# Patient Record
Sex: Female | Born: 1935 | Race: White | Hispanic: No | Marital: Married | Smoking: Never smoker
Health system: Southern US, Community
[De-identification: ages and names within clinical notes are randomized; demographics above are authoritative.]

## PROBLEM LIST (undated history)

## (undated) DIAGNOSIS — D649 Anemia, unspecified: Secondary | ICD-10-CM

## (undated) DIAGNOSIS — I4819 Other persistent atrial fibrillation: Secondary | ICD-10-CM

## (undated) DIAGNOSIS — I6529 Occlusion and stenosis of unspecified carotid artery: Secondary | ICD-10-CM

## (undated) DIAGNOSIS — J45909 Unspecified asthma, uncomplicated: Secondary | ICD-10-CM

## (undated) DIAGNOSIS — I739 Peripheral vascular disease, unspecified: Secondary | ICD-10-CM

## (undated) DIAGNOSIS — M1990 Unspecified osteoarthritis, unspecified site: Secondary | ICD-10-CM

## (undated) DIAGNOSIS — I10 Essential (primary) hypertension: Secondary | ICD-10-CM

## (undated) DIAGNOSIS — H8110 Benign paroxysmal vertigo, unspecified ear: Secondary | ICD-10-CM

## (undated) DIAGNOSIS — Z87442 Personal history of urinary calculi: Secondary | ICD-10-CM

## (undated) DIAGNOSIS — R42 Dizziness and giddiness: Secondary | ICD-10-CM

## (undated) DIAGNOSIS — R0600 Dyspnea, unspecified: Secondary | ICD-10-CM

## (undated) DIAGNOSIS — R011 Cardiac murmur, unspecified: Secondary | ICD-10-CM

## (undated) DIAGNOSIS — I2510 Atherosclerotic heart disease of native coronary artery without angina pectoris: Secondary | ICD-10-CM

## (undated) DIAGNOSIS — J189 Pneumonia, unspecified organism: Secondary | ICD-10-CM

## (undated) DIAGNOSIS — G4730 Sleep apnea, unspecified: Secondary | ICD-10-CM

## (undated) DIAGNOSIS — N200 Calculus of kidney: Secondary | ICD-10-CM

## (undated) DIAGNOSIS — I499 Cardiac arrhythmia, unspecified: Secondary | ICD-10-CM

## (undated) DIAGNOSIS — I779 Disorder of arteries and arterioles, unspecified: Secondary | ICD-10-CM

## (undated) DIAGNOSIS — E785 Hyperlipidemia, unspecified: Secondary | ICD-10-CM

## (undated) DIAGNOSIS — C801 Malignant (primary) neoplasm, unspecified: Secondary | ICD-10-CM

## (undated) HISTORY — DX: Disorder of arteries and arterioles, unspecified: I77.9

## (undated) HISTORY — PX: OTHER SURGICAL HISTORY: SHX169

## (undated) HISTORY — DX: Sleep apnea, unspecified: G47.30

## (undated) HISTORY — PX: CARDIAC SURGERY: SHX584

## (undated) HISTORY — PX: TONSILLECTOMY: SUR1361

## (undated) HISTORY — DX: Peripheral vascular disease, unspecified: I73.9

## (undated) HISTORY — DX: Atherosclerotic heart disease of native coronary artery without angina pectoris: I25.10

## (undated) HISTORY — DX: Calculus of kidney: N20.0

## (undated) HISTORY — PX: VASCULAR SURGERY: SHX849

## (undated) HISTORY — DX: Occlusion and stenosis of unspecified carotid artery: I65.29

## (undated) HISTORY — PX: CARPAL TUNNEL RELEASE: SHX101

## (undated) HISTORY — PX: CAROTID ENDARTERECTOMY: SUR193

## (undated) HISTORY — PX: CARDIAC CATHETERIZATION: SHX172

## (undated) HISTORY — DX: Dizziness and giddiness: R42

## (undated) HISTORY — DX: Hyperlipidemia, unspecified: E78.5

## (undated) HISTORY — PX: EYE SURGERY: SHX253

## (undated) HISTORY — PX: CORONARY ARTERY BYPASS GRAFT: SHX141

---

## 1997-09-13 ENCOUNTER — Encounter: Admission: RE | Admit: 1997-09-13 | Discharge: 1997-12-12 | Payer: Self-pay | Admitting: *Deleted

## 1999-07-06 ENCOUNTER — Inpatient Hospital Stay (HOSPITAL_COMMUNITY): Admission: EM | Admit: 1999-07-06 | Discharge: 1999-07-14 | Payer: Self-pay | Admitting: Emergency Medicine

## 1999-07-06 ENCOUNTER — Encounter: Payer: Self-pay | Admitting: Cardiothoracic Surgery

## 1999-07-07 ENCOUNTER — Encounter: Payer: Self-pay | Admitting: Cardiothoracic Surgery

## 1999-07-08 ENCOUNTER — Encounter: Payer: Self-pay | Admitting: Cardiothoracic Surgery

## 1999-07-09 ENCOUNTER — Encounter: Payer: Self-pay | Admitting: Cardiothoracic Surgery

## 1999-07-10 ENCOUNTER — Encounter: Payer: Self-pay | Admitting: Cardiothoracic Surgery

## 1999-07-11 ENCOUNTER — Encounter: Payer: Self-pay | Admitting: Cardiothoracic Surgery

## 1999-08-07 ENCOUNTER — Encounter (HOSPITAL_COMMUNITY): Admission: RE | Admit: 1999-08-07 | Discharge: 1999-11-05 | Payer: Self-pay | Admitting: *Deleted

## 2001-02-24 ENCOUNTER — Inpatient Hospital Stay (HOSPITAL_COMMUNITY): Admission: EM | Admit: 2001-02-24 | Discharge: 2001-02-25 | Payer: Self-pay

## 2001-02-25 ENCOUNTER — Encounter: Payer: Self-pay | Admitting: Cardiology

## 2001-02-25 ENCOUNTER — Encounter: Payer: Self-pay | Admitting: *Deleted

## 2001-04-09 ENCOUNTER — Other Ambulatory Visit: Admission: RE | Admit: 2001-04-09 | Discharge: 2001-04-09 | Payer: Self-pay | Admitting: Family Medicine

## 2002-09-08 ENCOUNTER — Encounter: Admission: RE | Admit: 2002-09-08 | Discharge: 2002-11-19 | Payer: Self-pay | Admitting: Orthopaedic Surgery

## 2004-04-16 ENCOUNTER — Encounter: Admission: RE | Admit: 2004-04-16 | Discharge: 2004-04-16 | Payer: Self-pay | Admitting: *Deleted

## 2005-04-22 ENCOUNTER — Encounter: Admission: RE | Admit: 2005-04-22 | Discharge: 2005-04-22 | Payer: Self-pay | Admitting: *Deleted

## 2005-04-30 ENCOUNTER — Ambulatory Visit: Payer: Self-pay | Admitting: Cardiology

## 2005-05-21 ENCOUNTER — Ambulatory Visit: Payer: Self-pay | Admitting: Cardiology

## 2005-05-22 ENCOUNTER — Encounter (INDEPENDENT_AMBULATORY_CARE_PROVIDER_SITE_OTHER): Payer: Self-pay | Admitting: Specialist

## 2005-05-22 ENCOUNTER — Inpatient Hospital Stay (HOSPITAL_COMMUNITY): Admission: RE | Admit: 2005-05-22 | Discharge: 2005-05-23 | Payer: Self-pay | Admitting: *Deleted

## 2006-03-04 ENCOUNTER — Emergency Department (HOSPITAL_COMMUNITY): Admission: EM | Admit: 2006-03-04 | Discharge: 2006-03-05 | Payer: Self-pay | Admitting: Emergency Medicine

## 2006-10-22 ENCOUNTER — Ambulatory Visit: Payer: Self-pay | Admitting: Cardiology

## 2007-02-19 ENCOUNTER — Ambulatory Visit: Payer: Self-pay | Admitting: *Deleted

## 2007-05-27 ENCOUNTER — Ambulatory Visit: Payer: Self-pay | Admitting: Cardiology

## 2007-07-06 ENCOUNTER — Ambulatory Visit: Payer: Self-pay

## 2007-11-09 ENCOUNTER — Encounter: Admission: RE | Admit: 2007-11-09 | Discharge: 2007-11-09 | Payer: Self-pay | Admitting: Orthopedic Surgery

## 2007-11-10 ENCOUNTER — Encounter (INDEPENDENT_AMBULATORY_CARE_PROVIDER_SITE_OTHER): Payer: Self-pay | Admitting: Orthopedic Surgery

## 2007-11-10 ENCOUNTER — Ambulatory Visit (HOSPITAL_BASED_OUTPATIENT_CLINIC_OR_DEPARTMENT_OTHER): Admission: RE | Admit: 2007-11-10 | Discharge: 2007-11-10 | Payer: Self-pay | Admitting: Orthopedic Surgery

## 2008-06-22 ENCOUNTER — Ambulatory Visit: Payer: Self-pay | Admitting: Cardiology

## 2008-10-03 ENCOUNTER — Encounter: Payer: Self-pay | Admitting: Cardiology

## 2008-12-09 ENCOUNTER — Ambulatory Visit: Payer: Self-pay | Admitting: Pulmonary Disease

## 2008-12-09 DIAGNOSIS — I10 Essential (primary) hypertension: Secondary | ICD-10-CM | POA: Insufficient documentation

## 2008-12-09 DIAGNOSIS — I2510 Atherosclerotic heart disease of native coronary artery without angina pectoris: Secondary | ICD-10-CM | POA: Insufficient documentation

## 2008-12-09 DIAGNOSIS — R059 Cough, unspecified: Secondary | ICD-10-CM | POA: Insufficient documentation

## 2008-12-09 DIAGNOSIS — E785 Hyperlipidemia, unspecified: Secondary | ICD-10-CM | POA: Insufficient documentation

## 2008-12-09 DIAGNOSIS — R05 Cough: Secondary | ICD-10-CM | POA: Insufficient documentation

## 2009-01-03 ENCOUNTER — Ambulatory Visit: Payer: Self-pay | Admitting: Pulmonary Disease

## 2009-01-03 ENCOUNTER — Ambulatory Visit (HOSPITAL_BASED_OUTPATIENT_CLINIC_OR_DEPARTMENT_OTHER): Admission: RE | Admit: 2009-01-03 | Discharge: 2009-01-03 | Payer: Self-pay | Admitting: Pulmonary Disease

## 2009-01-03 DIAGNOSIS — G4733 Obstructive sleep apnea (adult) (pediatric): Secondary | ICD-10-CM | POA: Insufficient documentation

## 2009-01-17 ENCOUNTER — Encounter: Payer: Self-pay | Admitting: Pulmonary Disease

## 2009-01-31 ENCOUNTER — Ambulatory Visit: Payer: Self-pay | Admitting: Pulmonary Disease

## 2009-01-31 DIAGNOSIS — G2581 Restless legs syndrome: Secondary | ICD-10-CM | POA: Insufficient documentation

## 2009-02-03 LAB — CONVERTED CEMR LAB
ALT: 24 units/L (ref 0–35)
AST: 20 units/L (ref 0–37)
Albumin: 4 g/dL (ref 3.5–5.2)
Alkaline Phosphatase: 74 units/L (ref 39–117)
BUN: 35 mg/dL — ABNORMAL HIGH (ref 6–23)
Basophils Absolute: 0 10*3/uL (ref 0.0–0.1)
Basophils Relative: 0.2 % (ref 0.0–3.0)
Bilirubin, Direct: 0.1 mg/dL (ref 0.0–0.3)
CO2: 30 meq/L (ref 19–32)
Calcium: 9.5 mg/dL (ref 8.4–10.5)
Chloride: 108 meq/L (ref 96–112)
Creatinine, Ser: 1.4 mg/dL — ABNORMAL HIGH (ref 0.4–1.2)
Eosinophils Absolute: 0.4 10*3/uL (ref 0.0–0.7)
Eosinophils Relative: 4.1 % (ref 0.0–5.0)
Ferritin: 115.4 ng/mL (ref 10.0–291.0)
Folate: >20 ng/mL
GFR calc non Af Amer: 38.96 mL/min (ref >60)
Glucose, Bld: 112 mg/dL — ABNORMAL HIGH (ref 70–99)
HCT: 39.3 % (ref 36.0–46.0)
Hemoglobin: 13.3 g/dL (ref 12.0–15.0)
Iron: 73 ug/dL (ref 42–145)
Lymphocytes Relative: 16.3 % (ref 12.0–46.0)
Lymphs Abs: 1.4 10*3/uL (ref 0.7–4.0)
MCHC: 33.8 g/dL (ref 30.0–36.0)
MCV: 91.3 fL (ref 78.0–100.0)
Monocytes Absolute: 0.8 10*3/uL (ref 0.1–1.0)
Monocytes Relative: 9.2 % (ref 3.0–12.0)
Neutro Abs: 6.2 10*3/uL (ref 1.4–7.7)
Neutrophils Relative %: 70.2 % (ref 43.0–77.0)
Platelets: 278 10*3/uL (ref 150.0–400.0)
Potassium: 4.5 meq/L (ref 3.5–5.1)
RBC: 4.3 M/uL (ref 3.87–5.11)
RDW: 13 % (ref 11.5–14.6)
Saturation Ratios: 19.4 % — ABNORMAL LOW (ref 20.0–50.0)
Sodium: 143 meq/L (ref 135–145)
TSH: 3.03 microintl units/mL (ref 0.35–5.50)
Total Bilirubin: 1 mg/dL (ref 0.3–1.2)
Total Protein: 7.3 g/dL (ref 6.0–8.3)
Transferrin: 269 mg/dL (ref 212.0–360.0)
Vitamin B-12: 667 pg/mL (ref 211–911)
WBC: 8.8 10*3/uL (ref 4.5–10.5)

## 2009-04-05 ENCOUNTER — Encounter (INDEPENDENT_AMBULATORY_CARE_PROVIDER_SITE_OTHER): Payer: Self-pay | Admitting: *Deleted

## 2009-05-22 ENCOUNTER — Encounter: Payer: Self-pay | Admitting: Pulmonary Disease

## 2009-06-28 ENCOUNTER — Encounter: Payer: Self-pay | Admitting: Pulmonary Disease

## 2009-08-08 ENCOUNTER — Encounter: Payer: Self-pay | Admitting: Pulmonary Disease

## 2009-08-15 ENCOUNTER — Encounter: Payer: Self-pay | Admitting: Pulmonary Disease

## 2009-08-31 ENCOUNTER — Ambulatory Visit: Payer: Self-pay | Admitting: Vascular Surgery

## 2009-09-13 ENCOUNTER — Ambulatory Visit: Payer: Self-pay | Admitting: Cardiology

## 2009-09-18 ENCOUNTER — Encounter: Payer: Self-pay | Admitting: Pulmonary Disease

## 2009-10-05 ENCOUNTER — Encounter: Payer: Self-pay | Admitting: Cardiology

## 2009-10-18 ENCOUNTER — Encounter (INDEPENDENT_AMBULATORY_CARE_PROVIDER_SITE_OTHER): Payer: Self-pay | Admitting: *Deleted

## 2010-04-02 ENCOUNTER — Ambulatory Visit: Payer: Self-pay | Admitting: Pulmonary Disease

## 2010-04-02 DIAGNOSIS — J45909 Unspecified asthma, uncomplicated: Secondary | ICD-10-CM | POA: Insufficient documentation

## 2010-05-01 ENCOUNTER — Encounter: Payer: Self-pay | Admitting: Pulmonary Disease

## 2010-06-05 ENCOUNTER — Ambulatory Visit
Admission: RE | Admit: 2010-06-05 | Discharge: 2010-06-05 | Payer: Self-pay | Source: Home / Self Care | Attending: Pulmonary Disease | Admitting: Pulmonary Disease

## 2010-06-09 ENCOUNTER — Encounter: Payer: Self-pay | Admitting: *Deleted

## 2010-06-13 ENCOUNTER — Encounter: Payer: Self-pay | Admitting: Pulmonary Disease

## 2010-06-19 NOTE — Letter (Signed)
Summary: Engineer, materials at [facility]  [address]   [place], [place] [address]   Phone: [phone]  Fax: [phone]        October 18, 2009 MRN: [id number]    [facility] [address] [place], [place]  [address]    Dear Ms. Lori Price,  Your test ordered by Selena Batten has been reviewed by your physician (or physician assistant) and was found to be normal or stable. Your physician (or physician assistant) felt no changes were needed at this time.  ____ Echocardiogram  ____ Cardiac Stress Test  __X__ Lab Work-Continue current therapy  ____ Peripheral vascular study of arms, legs or neck  ____ CT scan or X-ray  ____ Lung or Breathing test  ____ Other:   Thank you.   Cyril Loosen, RN, BSN    Duane Boston, M.D., F.A.C.C. Thressa Sheller, M.D., F.A.C.C. Oneal Grout, M.D., F.A.C.C. Cheree Ditto, M.D., F.A.C.C. Daiva Nakayama, M.D., F.A.C.C. Kenney Houseman, M.D., F.A.C.C. Jeanne Ivan, PA-C

## 2010-06-19 NOTE — Assessment & Plan Note (Signed)
Summary: ROV ///KP   Visit Type:  Follow-up Primary Provider/Referring Provider:  Doreen Beam  CC:  OSA...the patient has not worn her cpap since July 2011 and says the mask causes her eyes to swell shut...RLS symptoms come and go...no changes in her breathing.  History of Present Illness: 77 yo female with cough with asthma, RLS and OSA on CPAP 13.  She has been getting swelling in her eyes from her CPAP mask due to mask leak.  As a result she stopped using her CPAP in July.  She tried contacting her DME for a new mask, but never heard back.  She was told that her machine was payed for, and she was now responsible for replacement equipment.  Since she stopped CPAP she has notice her sleep and energy level have been worse.  She has also been getting more coughing at night.  This happens more as she is falling asleep.  She gets coughing spells when she is sitting quiet also (such as in church).  Her husband has commented that she can fall asleep when sitting quiet.  She is not having much wheeze or sputum.  She denies chest pain, fever, or hemoptysis.  Her sinuses are okay on her sinus regimen.  She has been getting a scratchy throat.  She was started on accupril over the summer, and this is around the time her cough seem to get worse.  Her legs have not been bothering her too much recently.  Current Medications (verified): 1)  Singulair 10 Mg Tabs (Montelukast Sodium) .... One By Mouth At Bedtime 2)  Qvar 80 Mcg/act Aers (Beclomethasone Dipropionate) .... Two Puffs Two Times A Day 3)  Proventil Hfa 108 (90 Base) Mcg/act Aers (Albuterol Sulfate) .Marland Kitchen.. 1-2 Puffs Four Times Daily As Needed 4)  Allegra 180 Mg Tabs (Fexofenadine Hcl) .Marland Kitchen.. 1 By Mouth Daily As Needed 5)  Atenolol 50 Mg Tabs (Atenolol) .... Take 1 Tablet Once A Day 6)  Simvastatin 40 Mg Tabs (Simvastatin) .... Take 1 Tablet By Mouth At Bedtime 7)  Triamterene-Hctz 37.5-25 Mg Caps (Triamterene-Hctz) .... Take 1 Capsule By Mouth Once  A Day 8)  Meclizine Hcl 25 Mg Tabs (Meclizine Hcl) .... 1/2-1 Daily As Needed 9)  Diclofenac Sodium 75 Mg Tbec (Diclofenac Sodium) .Marland Kitchen.. 1 By Mouth Two Times A Day 10)  Magnesium 250 Mg Tabs (Magnesium) .Marland Kitchen.. 1 By Mouth Two Times A Day 11)  Dulcolax 5 Mg Tbec (Bisacodyl) .Marland Kitchen.. 1 By Mouth Every Other Day 12)  Aspir-Low 81 Mg Tbec (Aspirin) .Marland Kitchen.. 1 By Mouth Daily 13)  Folic Acid 400 Mcg Tabs (Folic Acid) .Marland Kitchen.. 1 By Mouth Daily 14)  Accupril 20 Mg Tabs (Quinapril Hcl) .Marland Kitchen.. 1 By Mouth Daily 15)  Aspartate Mg & K 90-90 Mg Caps (Mag Aspart-Potassium Aspart) .Marland Kitchen.. 1 By Mouth 3 Times A Week 16)  Fish Oil 1000 Mg Caps (Omega-3 Fatty Acids) .Marland Kitchen.. 1 By Mouth Daily 17)  Protonix 40 Mg Tbec (Pantoprazole Sodium) .Marland Kitchen.. 1 By Mouth Two Times A Day  Allergies (verified): 1)  ! Codeine 2)  ! * Meridia 3)  ! Sulfa 4)  ! * Eggs-Flu Shot 5)  ! Ace Inhibitors 6)  ! Cozaar  Past History:  Past Medical History: Current Problems:  CAD s/ p MI 2001       - (catheterizations in 2001 with left main 70% stenosis, LAD 70% followed by 90% stenosis, circumflex99%         ostial stenosis, right coronary artery occluded).  Peripheral  vascular disease Hyperlipidemia Vertigo Nephrolithiasis Chronic cough/asthma      - Spirometry 12/09/08 FEV1 1.91(84%), FVC 2.39 (74%), FEV1% 80 OSA      - PSG 01/03/09 AHI 16, PLMI 87      - CPAP 13 cm H2O  Past Surgical History: Reviewed history from 09/07/2009 and no changes required. Knee Arthroscopy--left C A B G 2000 (LIMA to the LAD, SVG to obtuse marginal, sequential SVG to       RV, marginal branch in the distal right coronary artery).  Tonsillectomy Right carotid  endarterectomy 2007 Left leg fracture Left carpal tunnel repair 2009  Vital Signs:  Patient profile:   77 year old female Height:      68 inches (172.72 cm) Weight:      228.50 pounds (103.86 kg) BMI:     34.87 O2 Sat:      95 % on Room air Temp:     97.4 degrees F (36.33 degrees C) oral Pulse rate:   56  / minute BP sitting:   130 / 78  (left arm) Cuff size:   large  Vitals Entered By: Michel Bickers CMA (April 02, 2010 4:11 PM)  O2 Sat at Rest %:  95 O2 Flow:  Room air CC: OSA...the patient has not worn her cpap since July 2011 and says the mask causes her eyes to swell shut...RLS symptoms come and go...no changes in her breathing Comments Medications reviewed with patient Michel Bickers CMA  April 02, 2010 4:12 PM   Physical Exam  General:  normal appearance, healthy appearing, and obese.   Nose:  narrow nasal angles, clear discharge, no tenderness Mouth:  MP 3, enlarged tongue, low laying soft palate Neck:  no JVD, no thyromegaly, scar over right neck Lungs:  clear bilaterally to auscultation and percussion Heart:  regular rate and rhythm, S1, S2 without murmurs, rubs, gallops, or clicks Extremities:  no clubbing, cyanosis, edema, or deformity noted Neurologic:  normal CN II-XII and strength normal.   Cervical Nodes:  no significant adenopathy Psych:  alert and cooperative; normal mood and affect; normal attention span and concentration   Impression & Recommendations:  Problem # 1:  COUGH (ICD-786.2) Multifactorial.    She has asthma, and some of her symptoms are related to this.  Continue treatment as detailed below.  She was recently started on ACE inhibitor, and this may be contributing to her cough.  Will have her stop accupril, and start diovan 80 mg once daily.  Of note is that she is also on atenolol.  She may benefit from a more cardioselective beta blocker due to her asthma if her cough symptoms persist.  Some of her nocturnal cough may be related to untreated sleep apnea.  She may also be having episodes of micro-sleep associated with apneic events during the day.  Will restart CPAP therapy.  Problem # 2:  ASTHMA (ICD-493.90) Continue qvar, singulair.  She can continue as needed allegra and proventil.  Problem # 3:  OBSTRUCTIVE SLEEP APNEA (ICD-327.23) She did  well with CPAP except for mask problems.  Will arrange for mask refit at sleep lab.  Will re-establish her with a DME.  Problem # 4:  RESTLESS LEG SYNDROME (ICD-333.94) Continue clinical monitoring.  Medications Added to Medication List This Visit: 1)  Diovan 80 Mg Tabs (Valsartan) .... One by mouth once daily  Complete Medication List: 1)  Singulair 10 Mg Tabs (Montelukast sodium) .... One by mouth at bedtime 2)  Qvar 80 Mcg/act Aers (  Beclomethasone dipropionate) .... Two puffs two times a day 3)  Proventil Hfa 108 (90 Base) Mcg/act Aers (Albuterol sulfate) .Marland Kitchen.. 1-2 puffs four times daily as needed 4)  Allegra 180 Mg Tabs (Fexofenadine hcl) .Marland Kitchen.. 1 by mouth daily as needed 5)  Atenolol 50 Mg Tabs (Atenolol) .... Take 1 tablet once a day 6)  Simvastatin 40 Mg Tabs (Simvastatin) .... Take 1 tablet by mouth at bedtime 7)  Triamterene-hctz 37.5-25 Mg Caps (Triamterene-hctz) .... Take 1 capsule by mouth once a day 8)  Meclizine Hcl 25 Mg Tabs (Meclizine hcl) .... 1/2-1 daily as needed 9)  Diclofenac Sodium 75 Mg Tbec (Diclofenac sodium) .Marland Kitchen.. 1 by mouth two times a day 10)  Magnesium 250 Mg Tabs (Magnesium) .Marland Kitchen.. 1 by mouth two times a day 11)  Dulcolax 5 Mg Tbec (Bisacodyl) .Marland Kitchen.. 1 by mouth every other day 12)  Aspir-low 81 Mg Tbec (Aspirin) .Marland Kitchen.. 1 by mouth daily 13)  Folic Acid 400 Mcg Tabs (Folic acid) .Marland Kitchen.. 1 by mouth daily 14)  Aspartate Mg & K 90-90 Mg Caps (Mag aspart-potassium aspart) .Marland Kitchen.. 1 by mouth 3 times a week 15)  Fish Oil 1000 Mg Caps (Omega-3 fatty acids) .Marland Kitchen.. 1 by mouth daily 16)  Protonix 40 Mg Tbec (Pantoprazole sodium) .Marland Kitchen.. 1 by mouth two times a day 17)  Diovan 80 Mg Tabs (Valsartan) .... One by mouth once daily  Other Orders: Est. Patient Level IV ([address]) DME Referral (DME) Sleep Disorder Referral (Sleep Disorder)  Patient Instructions: 1)  Stop accupril 2)  Diovan 80 mg once daily 3)  Will arrange for CPAP mask fit at sleep lab 4)  Follow up in 2  months Prescriptions: QVAR 80 MCG/ACT AERS (BECLOMETHASONE DIPROPIONATE) two puffs two times a day  #3 x 3   Entered and Authorized by:   Coralyn Helling MD   Signed by:   Coralyn Helling MD on 04/02/2010   Method used:   Printed then faxed to ...       [facility]ail[facility]       [address]       [place], [place]  [address]       Ph: [phone] or [phone]       Fax: [phone]   RxID:   [id number] SINGULAIR 10 MG TABS (MONTELUKAST SODIUM) one by mouth at bedtime  #90 x 3   Entered and Authorized by:   Coralyn Helling MD   Signed by:   Coralyn Helling MD on 04/02/2010   Method used:   Printed then faxed to ...       Hospital doctor (retail)       [address]       [place], [place]  [address]       Ph: [phone] or [phone]       Fax: [phone]   RxID:   [id number] DIOVAN 80 MG TABS (VALSARTAN) one by mouth once daily  #30 x 6   Entered and Authorized by:   Coralyn Helling MD   Signed by:   Coralyn Helling MD on 04/02/2010   Method used:   Print then Give to Patient   RxID:   [id number]

## 2010-06-19 NOTE — Assessment & Plan Note (Signed)
Summary: [facility]   Visit Type:  Follow-up Primary Provider:  Doreen Beam  CC:  CAD.  History of Present Illness: The patient presents for yearly followup. Since I last saw her she has had no new cardiovascular complaints. She does not exercise but goes up and down stairs. She may get slightly dyspneic with this. However, she has no resting shortness of breath, PND or orthopnea. She does not report chest discomfort, neck or arm discomfort. She does not have palpitations, presyncope or syncope. She unfortunately has not been able to lose weight.  Of note she had a significant cough when I last saw her. Despite stopping her ARB and the cough did not improve. She was subsequently found to have sleep apnea and treatment with CPAP improved her cough.  Current Medications (verified): 1)  Singulair 10 Mg Tabs (Montelukast Sodium) .... One By Mouth At Bedtime 2)  Qvar 80 Mcg/act Aers (Beclomethasone Dipropionate) .... Two Puffs Two Times A Day 3)  Proventil Hfa 108 (90 Base) Mcg/act Aers (Albuterol Sulfate) .Marland Kitchen.. 1-2 Puffs Four Times Daily As Needed 4)  Allegra 180 Mg Tabs (Fexofenadine Hcl) .Marland Kitchen.. 1 By Mouth Daily As Needed 5)  Atenolol 50 Mg Tabs (Atenolol) .... Take 1 Tablet Once A Day 6)  Simvastatin 40 Mg Tabs (Simvastatin) .... Take 1 Tablet By Mouth At Bedtime 7)  Triamterene-Hctz 37.5-25 Mg Caps (Triamterene-Hctz) .... Take 1 Capsule By Mouth Once A Day 8)  Meclizine Hcl 25 Mg Tabs (Meclizine Hcl) .... 1/2-1 Daily As Needed 9)  Diclofenac Sodium 75 Mg Tbec (Diclofenac Sodium) .Marland Kitchen.. 1 By Mouth Two Times A Day 10)  Magnesium 250 Mg Tabs (Magnesium) .Marland Kitchen.. 1 By Mouth Two Times A Day 11)  Dulcolax 5 Mg Tbec (Bisacodyl) .Marland Kitchen.. 1 By Mouth Every Other Day 12)  Aspir-Low 81 Mg Tbec (Aspirin) .Marland Kitchen.. 1 By Mouth Daily 13)  Folic Acid 400 Mcg Tabs (Folic Acid) .Marland Kitchen.. 1 By Mouth Daily 14)  Accupril 20 Mg Tabs (Quinapril Hcl) .Marland Kitchen.. 1 By Mouth Daily 15)  Aspartate Mg & K 90-90 Mg Caps (Mag Aspart-Potassium  Aspart) .Marland Kitchen.. 1 By Mouth 3 Times A Week 16)  Fish Oil 1000 Mg Caps (Omega-3 Fatty Acids) .Marland Kitchen.. 1 By Mouth Daily 17)  Protonix 40 Mg Tbec (Pantoprazole Sodium) .Marland Kitchen.. 1 By Mouth Two Times A Day  Allergies (verified): 1)  ! Codeine 2)  ! * Meridia 3)  ! Sulfa 4)  ! * Eggs-Flu Shot 5)  ! Ace Inhibitors 6)  ! Cozaar  Past History:  Past Medical History: Reviewed history from 09/07/2009 and no changes required. Current Problems:  CAD s/ p MI 2001 (catheterizations in 2001 with left main       70% stenosis, LAD 70% followed by 90% stenosis, circumflex 99%       ostial stenosis, right coronary artery occluded).     Peripheral vascular disease Hyperlipidemia Vertigo Nephrolithiasis Chronic cough/asthma      - Spirometry 12/09/08 FEV1 1.91(84%), FVC 2.39 (74%), FEV1% 80 OSA      - PSG 01/03/09 AHI 16, PLMI 87      - CPAP 13 cm H2O  Past Surgical History: Reviewed history from 09/07/2009 and no changes required. Knee Arthroscopy--left C A B G 2000 (LIMA to the LAD, SVG to obtuse marginal, sequential SVG to       RV, marginal branch in the distal right coronary artery).  Tonsillectomy Right carotid  endarterectomy 2007 Left leg fracture Left carpal tunnel repair 2009  Review of Systems  As stated in the HPI and negative for all other systems.   Vital Signs:  Patient profile:   77 year old female Height:      68 inches Weight:      230 pounds BMI:     35.10 Pulse rate:   54 / minute Resp:     18 per minute BP sitting:   120 / 78  (right arm)  Vitals Entered By: Marrion Coy, CNA (September 13, 2009 4:16 PM)  Physical Exam  General:  Well developed, well nourished, in no acute distress. Head:  normocephalic and atraumatic Neck:  Neck supple, no JVD. No masses, thyromegaly or abnormal cervical nodes. Chest Wall:  no deformities or breast masses noted Lungs:  Clear bilaterally to auscultation and percussion. Abdomen:  Bowel sounds positive; abdomen soft and non-tender  without masses, organomegaly, or hernias noted. No hepatosplenomegaly, obese Msk:  Back normal, normal gait. Muscle strength and tone normal. Extremities:  No clubbing or cyanosis. Neurologic:  Alert and oriented x 3. Skin:  Intact without lesions or rashes. Psych:  Normal affect.   Detailed Cardiovascular Exam  Neck    Carotids: Carotids full and equal bilaterally without bruits.      Neck Veins: Normal, no JVD.    Heart    Inspection: no deformities or lifts noted.      Palpation: normal PMI with no thrills palpable.      Auscultation: S1 and S2 within normal limits, no S3, no S4, no clicks, no rubs, 2/6 apical systolic murmur radiating slightly out the aortic outflow tract, early peaking, no diastolic murmurs  Vascular    Abdominal Aorta: no palpable masses, pulsations, or audible bruits.      Femoral Pulses: normal femoral pulses bilaterally.      Pedal Pulses: normal pedal pulses bilaterally.      Radial Pulses: normal radial pulses bilaterally.      Peripheral Circulation: no clubbing, cyanosis, or edema noted with normal capillary refill.     EKG  Procedure date:  09/13/2009  Findings:      sinus rhythm, rate 54, axis within normal limits, intervals within normal limits, nonspecific high lateral T-wave inversions new since previous  Impression & Recommendations:  Problem # 1:  CORONARY HEART DISEASE (ICD-414.00) She is having no new symptoms since her last stress perfusion study. She will continue with risk reduction. No further testing is indicated. Orders: EKG w/ Interpretation (93000)  Problem # 2:  HYPERTENSION (ICD-401.9) She reports that her blood pressure does go up occasionally and she can feel this. She takes 20 mg of Accupril and this improves it for the rest of that day. She does this rarely. Since this works I do not have a problem with her doing this. However, she will let me know if she is having more problems with her blood pressure over  time.  Problem # 3:  HYPERLIPIDEMIA (ICD-272.4) She has not had a recent lipid profile and I have instructed her to obtain one and given her written instructions. I would be happy to review this with a goal LDL less than 100 and HDL greater than 40. Prescriptions: ACCUPRIL 20 MG TABS (QUINAPRIL HCL) 1 by mouth daily  #30 x 11   Entered by:   Charolotte Capuchin, RN   Authorized by:   Rollene Rotunda, MD, [facility]   Signed by:   Charolotte Capuchin, RN on 09/13/2009   Method used:   Print then Give to Patient   RxID:   [id number]

## 2010-06-19 NOTE — Letter (Signed)
Summary: CMN for CPAP supplies/[facility]  CMN for CPAP supplies/[facility]   Imported By: Sherian Rein 05/26/2009 11:28:44  _____________________________________________________________________  External Attachment:    Type:   Image     Comment:   External Document

## 2010-06-19 NOTE — Letter (Signed)
Summary: CMN for CPAP Supplies/[facility]  CMN for CPAP Supplies/[facility]   Imported By: Sherian Rein 09/25/2009 15:05:19  _____________________________________________________________________  External Attachment:    Type:   Image     Comment:   External Document

## 2010-06-19 NOTE — Letter (Signed)
Summary: CMN for CPAP Supplies/[facility]  CMN for CPAP Supplies/[facility]   Imported By: Sherian Rein 08/21/2009 11:30:38  _____________________________________________________________________  External Attachment:    Type:   Image     Comment:   External Document

## 2010-06-19 NOTE — Miscellaneous (Signed)
  Clinical Lists Changes  Medications: Rx of ACCUPRIL 20 MG TABS (QUINAPRIL HCL) 1 by mouth daily;  #30 x 11;  Signed;  Entered by: Charolotte Capuchin, RN;  Authorized by: Rollene Rotunda, MD, [facility];  Method used: Faxed to [facility], [phone] [place], [place], [place]  [address], Ph: [phone] or [phone], Fax: [phone] Observations: Added new observation of PI CARDIO: Your physician recommends that you schedule a follow-up appointment in: 1 year with Dr Antoine Poche Your physician has recommended you make the following change in your medication: Accupril 20 mg daily (09/13/2009 16:50)    Prescriptions: ACCUPRIL 20 MG TABS (QUINAPRIL HCL) 1 by mouth daily  #30 x 11   Entered by:   Charolotte Capuchin, RN   Authorized by:   Rollene Rotunda, MD, [facility]   Signed by:   Charolotte Capuchin, RN on 09/13/2009   Method used:   Faxed to ...       Hospital doctor (retail)       [address]       [place], [place]  [address]       Ph: [phone] or [phone]       Fax: [phone]   RxID:   [id number]    Patient Instructions: 1)  Your physician recommends that you schedule a follow-up appointment in: 1 year with Dr Antoine Poche 2)  Your physician has recommended you make the following change in your medication: Accupril 20 mg daily

## 2010-06-19 NOTE — Letter (Signed)
Summary: CMN for CPAP Supplies/[facility]  CMN for CPAP Supplies/[facility]   Imported By: Sherian Rein 08/11/2009 11:58:38  _____________________________________________________________________  External Attachment:    Type:   Image     Comment:   External Document

## 2010-06-19 NOTE — Letter (Signed)
Summary: CMN for CPAP Supplies/[facility]  CMN for CPAP Supplies/[facility]   Imported By: Sherian Rein 07/12/2009 11:45:28  _____________________________________________________________________  External Attachment:    Type:   Image     Comment:   External Document

## 2010-06-21 NOTE — Assessment & Plan Note (Signed)
Summary: 2 MONTHS/ MBW   Primary Provider/Referring Provider:  Doreen Beam  CC:  2 month follow up. Pt states she uses her cpap mostly everynight unless she gets congested. Pt states she needs a new mask.  Pt states she has her "good" and "bad" days with her breathing. Cough has improved. Marland Kitchen  History of Present Illness: 77 yo female with cough, asthma, OSA, and RLS.  She has been doing better.  She still has occasional cough, but much better than before.  She has not been using proventil.    She is doing well with CPAP.  She still has not received a new mask.  She is sleeping well otherwise, and feels rested during the day.    Current Medications (verified): 1)  Singulair 10 Mg Tabs (Montelukast Sodium) .... One By Mouth At Bedtime 2)  Qvar 80 Mcg/act Aers (Beclomethasone Dipropionate) .... Two Puffs Two Times A Day 3)  Proventil Hfa 108 (90 Base) Mcg/act Aers (Albuterol Sulfate) .Marland Kitchen.. 1-2 Puffs Four Times Daily As Needed 4)  Allegra 180 Mg Tabs (Fexofenadine Hcl) .Marland Kitchen.. 1 By Mouth Daily As Needed 5)  Atenolol 50 Mg Tabs (Atenolol) .... Take 1 Tablet Once A Day 6)  Simvastatin 40 Mg Tabs (Simvastatin) .... Take 1 Tablet By Mouth At Bedtime 7)  Triamterene-Hctz 37.5-25 Mg Caps (Triamterene-Hctz) .... Take 1 Capsule By Mouth Once A Day 8)  Meclizine Hcl 25 Mg Tabs (Meclizine Hcl) .... 1/2-1 Daily As Needed 9)  Diclofenac Sodium 75 Mg Tbec (Diclofenac Sodium) .Marland Kitchen.. 1 By Mouth Two Times A Day 10)  Magnesium 250 Mg Tabs (Magnesium) .Marland Kitchen.. 1 By Mouth Two Times A Day 11)  Dulcolax 5 Mg Tbec (Bisacodyl) .Marland Kitchen.. 1 By Mouth Every Other Day 12)  Aspir-Low 81 Mg Tbec (Aspirin) .Marland Kitchen.. 1 By Mouth Daily 13)  Folic Acid 400 Mcg Tabs (Folic Acid) .Marland Kitchen.. 1 By Mouth Daily 14)  Aspartate Mg & K 90-90 Mg Caps (Mag Aspart-Potassium Aspart) .Marland Kitchen.. 1 By Mouth 3 Times A Week 15)  Fish Oil 1000 Mg Caps (Omega-3 Fatty Acids) .Marland Kitchen.. 1 By Mouth Daily 16)  Protonix 40 Mg Tbec (Pantoprazole Sodium) .Marland Kitchen.. 1 By Mouth Two Times A  Day 17)  Diovan 80 Mg Tabs (Valsartan) .... One By Mouth Once Daily  Allergies (verified): 1)  ! Codeine 2)  ! * Meridia 3)  ! Sulfa 4)  ! * Eggs-Flu Shot 5)  ! Ace Inhibitors 6)  ! Cozaar 7)  ! * Quinapril  Past History:  Past Medical History: Current Problems:  CAD s/ p MI 2001       - (catheterizations in 2001 with left main 70% stenosis, LAD 70% followed by 90% stenosis, circumflex99%         ostial stenosis, right coronary artery occluded).  Peripheral vascular disease Hyperlipidemia Vertigo Nephrolithiasis Chronic cough/asthma/ACE inhibitor cough      - Spirometry 12/09/08 FEV1 1.91(84%), FVC 2.39 (74%), FEV1% 80 OSA      - PSG 01/03/09 AHI 16, PLMI 87      - CPAP 13 cm H2O  Past Surgical History: Reviewed history from 09/07/2009 and no changes required. Knee Arthroscopy--left [facility] 2000 (LIMA to the LAD, SVG to obtuse marginal, sequential SVG to       RV, marginal branch in the distal right coronary artery).  Tonsillectomy Right carotid  endarterectomy 2007 Left leg fracture Left carpal tunnel repair 2009  Vital Signs:  Patient profile:   77 year old female Height:  68 inches Weight:      231.38 pounds BMI:     35.31 O2 Sat:      94 % on Room air Temp:     98.4 degrees F oral Pulse rate:   57 / minute BP sitting:   120 / 78  (left arm) Cuff size:   large  Vitals Entered By: Carver Fila (June 05, 2010 2:38 PM)  O2 Flow:  Room air CC: 2 month follow up. Pt states she uses her cpap mostly everynight unless she gets congested. Pt states she needs a new mask.  Pt states she has her "good" and "bad" days with her breathing. Cough has improved.  Comments meds and allergies updated Phone number updated Carver Fila  June 05, 2010 2:38 PM    Physical Exam  General:  normal appearance, healthy appearing, and obese.   Nose:  narrow nasal angles, clear discharge, no tenderness Mouth:  MP 3, enlarged tongue, low laying soft palate Neck:  no JVD, no  thyromegaly, scar over right neck Lungs:  clear bilaterally to auscultation and percussion Heart:  regular rate and rhythm, S1, S2 without murmurs, rubs, gallops, or clicks Extremities:  no clubbing, cyanosis, edema, or deformity noted Neurologic:  normal CN II-XII and strength normal.   Cervical Nodes:  no significant adenopathy Psych:  alert and cooperative; normal mood and affect; normal attention span and concentration   Impression & Recommendations:  Problem # 1:  ASTHMA (ICD-493.90)  Continue qvar.  She can stop singulair.  She can continue as needed allegra and proventil.  Will re-assess in Spring.  Problem # 2:  OBSTRUCTIVE SLEEP APNEA (ICD-327.23)  Will arrange for her DME to arrange for mask fit.  Problem # 3:  RESTLESS LEG SYNDROME (ICD-333.94)  Continue clinical monitoring.  Problem # 4:  COUGH (ICD-786.2) Improved with change in blood pressure medication.  Also related to asthma and allergies.  Problem # 5:  HYPERTENSION (ICD-401.9) Stable on her current regimen.  Complete Medication List: 1)  Qvar 80 Mcg/act Aers (Beclomethasone dipropionate) .... Two puffs two times a day 2)  Proventil Hfa 108 (90 Base) Mcg/act Aers (Albuterol sulfate) .Marland Kitchen.. 1-2 puffs four times daily as needed 3)  Allegra 180 Mg Tabs (Fexofenadine hcl) .Marland Kitchen.. 1 by mouth daily as needed 4)  Atenolol 50 Mg Tabs (Atenolol) .... Take 1 tablet once a day 5)  Simvastatin 40 Mg Tabs (Simvastatin) .... Take 1 tablet by mouth at bedtime 6)  Triamterene-hctz 37.5-25 Mg Caps (Triamterene-hctz) .... Take 1 capsule by mouth once a day 7)  Meclizine Hcl 25 Mg Tabs (Meclizine hcl) .... 1/2-1 daily as needed 8)  Diclofenac Sodium 75 Mg Tbec (Diclofenac sodium) .Marland Kitchen.. 1 by mouth two times a day 9)  Magnesium 250 Mg Tabs (Magnesium) .Marland Kitchen.. 1 by mouth two times a day 10)  Dulcolax 5 Mg Tbec (Bisacodyl) .Marland Kitchen.. 1 by mouth every other day 11)  Aspir-low 81 Mg Tbec (Aspirin) .Marland Kitchen.. 1 by mouth daily 12)  Folic Acid 400 Mcg Tabs  (Folic acid) .Marland Kitchen.. 1 by mouth daily 13)  Aspartate Mg & K 90-90 Mg Caps (Mag aspart-potassium aspart) .Marland Kitchen.. 1 by mouth 3 times a week 14)  Fish Oil 1000 Mg Caps (Omega-3 fatty acids) .Marland Kitchen.. 1 by mouth daily 15)  Protonix 40 Mg Tbec (Pantoprazole sodium) .Marland Kitchen.. 1 by mouth two times a day 16)  Diovan 80 Mg Tabs (Valsartan) .... One by mouth once daily  Other Orders: Est. Patient Level IV (16109) DME Referral (DME)  Patient Instructions: 1)  Stop singulair 2)  Continue Qvar two puffs two times a day 3)  Proventil two puffs up to four times per day as needed 4)  Follow up in April 2012 Prescriptions: PROVENTIL HFA 108 (90 BASE) MCG/ACT AERS (ALBUTEROL SULFATE) 1-2 puffs four times daily as needed  #1 x 6   Entered and Authorized by:   Coralyn Helling MD   Signed by:   Coralyn Helling MD on 06/05/2010   Method used:   Print then Give to Patient   RxID:   [id number]

## 2010-06-21 NOTE — Letter (Signed)
Summary: Recert for PAP Supplies/[facility]  Recert for PAP Supplies/[facility]   Imported By: Sherian Rein 05/07/2010 12:44:13  _____________________________________________________________________  External Attachment:    Type:   Image     Comment:   External Document

## 2010-06-27 NOTE — Letter (Signed)
Summary: CMN/[facility]  CMN/[facility]   Imported By: Lester Hazard 06/20/2010 11:13:01  _____________________________________________________________________  External Attachment:    Type:   Image     Comment:   External Document

## 2010-09-12 ENCOUNTER — Other Ambulatory Visit (INDEPENDENT_AMBULATORY_CARE_PROVIDER_SITE_OTHER): Payer: Medicare Other

## 2010-09-12 DIAGNOSIS — Z48812 Encounter for surgical aftercare following surgery on the circulatory system: Secondary | ICD-10-CM

## 2010-09-12 DIAGNOSIS — I6529 Occlusion and stenosis of unspecified carotid artery: Secondary | ICD-10-CM

## 2010-09-15 ENCOUNTER — Encounter: Payer: Self-pay | Admitting: Cardiology

## 2010-09-19 ENCOUNTER — Encounter: Payer: Self-pay | Admitting: Cardiology

## 2010-09-19 ENCOUNTER — Ambulatory Visit (INDEPENDENT_AMBULATORY_CARE_PROVIDER_SITE_OTHER): Payer: Medicare Other | Admitting: Cardiology

## 2010-09-19 VITALS — BP 132/71 | HR 44 | Ht 67.0 in | Wt 222.0 lb

## 2010-09-19 DIAGNOSIS — I6529 Occlusion and stenosis of unspecified carotid artery: Secondary | ICD-10-CM

## 2010-09-19 DIAGNOSIS — I779 Disorder of arteries and arterioles, unspecified: Secondary | ICD-10-CM | POA: Insufficient documentation

## 2010-09-19 DIAGNOSIS — E785 Hyperlipidemia, unspecified: Secondary | ICD-10-CM

## 2010-09-19 DIAGNOSIS — I10 Essential (primary) hypertension: Secondary | ICD-10-CM

## 2010-09-19 DIAGNOSIS — E669 Obesity, unspecified: Secondary | ICD-10-CM | POA: Insufficient documentation

## 2010-09-19 DIAGNOSIS — I2510 Atherosclerotic heart disease of native coronary artery without angina pectoris: Secondary | ICD-10-CM

## 2010-09-19 NOTE — Procedures (Unsigned)
CAROTID DUPLEX EXAM  INDICATION:  Follow up carotid artery disease.  HISTORY: Diabetes:  No. Cardiac:  MI in 2001, CABG in 2001 by Dr. Donata Clay. Hypertension:  Yes. Smoking:  No. Previous Surgery:  Right carotid endarterectomy with DPA, 05/22/2005 by Dr. Madilyn Fireman. CV History:  Patient is currently asymptomatic. Amaurosis Fugax No, Paresthesias No, Hemiparesis No.                                      RIGHT             LEFT Brachial systolic pressure:         130               130 Brachial Doppler waveforms:         WNL               WNL Vertebral direction of flow:        Antegrade         Antegrade DUPLEX VELOCITIES (cm/sec) CCA peak systolic                   69                83 ECA peak systolic                   75                59 ICA peak systolic                   75                62 ICA end diastolic                   15                19 PLAQUE MORPHOLOGY:                                    Heterogenous PLAQUE AMOUNT:                                        Mild-to-moderate PLAQUE LOCATION:                                      ICA  IMPRESSION: 1. No bilateral hemodynamically significant stenosis. 2. Patent and durable right carotid endarterectomy. 3. Study is stable compared to previous.     ___________________________________________ Di Kindle. Edilia Bo, M.D.  OD/MEDQ  D:  09/12/2010  T:  09/13/2010  Job:  [id number]

## 2010-09-19 NOTE — Patient Instructions (Addendum)
Follow up in 12 months with Dr Antoine Poche in [place] Continue current medications Have fasting lipid profile and a basic metabolic panel drawn

## 2010-09-19 NOTE — Assessment & Plan Note (Signed)
The patient understands the need to lose weight with diet and exercise. We have discussed specific strategies for this.  

## 2010-09-19 NOTE — Progress Notes (Signed)
HPI The patient presents for one year followup. Since I last saw her she has had no new cardiovascular complaints. She remains active although she is somewhat limited by knee and back pain. With her activities she denies any chest pressure, neck or arm discomfort. She has no palpitations, presyncope or syncope. She has had no PND or orthopnea. She has had some intentional weight loss and is watching her diet.  Allergies  Allergen Reactions  . Ace Inhibitors   . Codeine   . Losartan Potassium   . Quinapril     REACTION: cough  . Sibutramine     REACTION: palpitations  . Sulfonamide Derivatives     Current Outpatient Prescriptions  Medication Sig Dispense Refill  . albuterol (PROVENTIL HFA) 108 (90 BASE) MCG/ACT inhaler Inhale 2 puffs into the lungs every 6 (six) hours as needed.        Marland Kitchen aspirin 81 MG tablet Take 81 mg by mouth daily.        Marland Kitchen atenolol (TENORMIN) 50 MG tablet Take 50 mg by mouth daily.        . beclomethasone (QVAR) 80 MCG/ACT inhaler Inhale 1 puff into the lungs as needed.        . bisacodyl (DULCOLAX) 5 MG EC tablet Take 5 mg by mouth every other day.        . diclofenac (VOLTAREN) 75 MG EC tablet Take 75 mg by mouth 2 (two) times daily.        . fexofenadine (ALLEGRA) 180 MG tablet Take 180 mg by mouth as needed.        . folic acid (FOLVITE) 400 MCG tablet Take 400 mcg by mouth daily.        Jodelle Green Aspart-Potassium Aspart (ASPARTATE MG & K) 90-90 MG CAPS Take 1 capsule by mouth 3 (three) times a week.        . Magnesium 250 MG TABS Take 1 tablet by mouth 2 (two) times daily.        . meclizine (ANTIVERT) 25 MG tablet Take 25 mg by mouth 3 (three) times daily as needed.        . Omega-3 Fatty Acids (FISH OIL) 1000 MG CAPS Take 1 capsule by mouth daily.        . pantoprazole (PROTONIX) 40 MG tablet Take 40 mg by mouth 2 (two) times daily.        . simvastatin (ZOCOR) 40 MG tablet Take 40 mg by mouth at bedtime.        . triamterene-hydrochlorothiazide (DYAZIDE) 37.5-25  MG per capsule Take 1 capsule by mouth every morning.        Marland Kitchen DISCONTD: triamterene-hydrochlorothiazide (DYAZIDE) 37.5-25 MG per capsule Take 1 capsule by mouth every morning.        Marland Kitchen DISCONTD: valsartan (DIOVAN) 80 MG tablet Take 80 mg by mouth daily.         Past Medical History  Diagnosis Date  . Coronary artery disease     s/p MI 2001--(catheterizations in 2001 with left main 70% stenosis, LAD 70% followed by 90% stenosis, circumflex 99% stenosis, right coronary artery occluded.   . Peripheral vascular disease   . Hyperlipidemia   . Vertigo   . Nephrolithiasis   . Chronic cough   . Asthma   . CPAP (continuous positive airway pressure) dependence     Past Surgical History  Procedure Date  . Knee arthoscopy   . Coronary artery bypass graft     2000 (LIMA to  LAD, SVG to obtuse marginal, sequential  SVG to RV , marginal branch in the distal right coronary artery.  . Tonsillectomy   . Right carotid endarterectomy   . Left carpal tunnel     ROS:  Back, knee pain.. Otherwise as stated in the HPI and negative for all other systems.  PHYSICAL EXAM BP 132/71  Pulse 44  Ht 5\' 7"  (1.702 m)  Wt 222 lb (100.699 kg)  BMI 34.77 kg/m2 GENERAL:  Well appearing NECK:  No jugular venous distention, waveform within normal limits, carotid upstroke brisk and symmetric, bilateral bruits, no thyromegaly, CEA scar LYMPHATICS:  No cervical, inguinal adenopathy LUNGS:  Clear to auscultation bilaterally BACK:  No CVA tenderness CHEST:  Well healed sternotomy scar. HEART:  PMI not displaced or sustained,S1 and S2 within normal limits, no S3, no S4, no clicks, no rubs, no murmurs ABD:  Flat, positive bowel sounds normal in frequency in pitch, no bruits, no rebound, no guarding, no midline pulsatile mass, no hepatomegaly, no splenomegaly EXT:  2 plus pulses throughout, no edema, no cyanosis no clubbing, SVG right scar.  Right leg larger than left SKIN:  No rashes no nodules NEURO:  Cranial nerves  II through XII grossly intact, motor grossly intact throughout PSYCH:  Cognitively intact, oriented to person place and time  EKG:  Sinus bradycardia.  No acute ST T wave changes.  ASSESSMENT AND PLAN

## 2010-09-19 NOTE — Assessment & Plan Note (Signed)
She has no active symptoms. She had a stress perfusion imaging in 2009. I do not think one is indicated this year though I might consider this routinely next year and certainly would if she is to have any surgery such as knee surgery for back surgery though she doesn't plan meds.

## 2010-09-19 NOTE — Assessment & Plan Note (Signed)
Her blood pressure is controlled. Of note she took herself off of Diovan because of constipation. She will remain on meds as listed.

## 2010-09-19 NOTE — Assessment & Plan Note (Signed)
It has been a while since a lipid profile and I will send her for a fasting lipid profile. She will also have a basic metabolic profile and she is on a diuretic.

## 2010-10-02 NOTE — Procedures (Signed)
CAROTID DUPLEX EXAM   INDICATION:  Follow up known carotid disease.   HISTORY:  Diabetes:  No.  Cardiac:  MI in 2001.  CABG in 2001 by Dr. Donata Clay.  Hypertension:  Yes.  Smoking:  No.  Previous Surgery:  Right carotid endarterectomy with DPA on 05/22/05 by  Dr. Madilyn Fireman.  CV History:  No.  Amaurosis Fugax No, Paresthesias No, Hemiparesis No.                                       RIGHT             LEFT  Brachial systolic pressure:         130               130  Brachial Doppler waveforms:         Triphasic         Triphasic  Vertebral direction of flow:        Antegrade         Antegrade  DUPLEX VELOCITIES (cm/sec)  CCA peak systolic                   108               95  ECA peak systolic                   131               123  ICA peak systolic                   104               84  ICA end diastolic                   29                28  PLAQUE MORPHOLOGY:                  Heterogenous      Heterogenous  PLAQUE AMOUNT:                      Mild              Mild  PLAQUE LOCATION:                    ICA               ICA   IMPRESSION:  1. No right internal carotid artery stenosis, status post carotid      endarterectomy.  2. 1% to 39% stenosis in left internal carotid artery.  3. Antegrade flow in bilateral vertebral arteries.   ___________________________________________  Di Kindle. Edilia Bo, M.D.   NT/MEDQ  D:  08/31/2009  T:  08/31/2009  Job:  [id number]

## 2010-10-02 NOTE — Assessment & Plan Note (Signed)
[facility]                            CARDIOLOGY OFFICE NOTE   BEDA, DULA                          MRN:          [id number]  DATE:05/27/2007                            DOB:          12-Apr-1932    PRIMARY:  Dr. Doreen Beam   REASON FOR PRESENTATION:  Evaluate patient for coronary disease.   HISTORY OF PRESENT ILLNESS:  The patient is a 77 year old with a history  of coronary disease as described below.  She has done well since I last  saw her.  It has been six months.  She has had problems with upper  respiratory infections and seems to have battled these kinds of  illnesses for the past month.  She has had coughing and has not been  doing her Curves as she had been doing.  However, prior to this she was  not having any cardiovascular complaints.  She took a trip in the fall  and walked around [place]  without any difficulties.  She has not  been having any chest discomfort, neck or arm discomfort.  She is  currently having some chest tightness with breathing and coughing that  she ascribes to the upper respiratory infection.  She is not having any  PND or orthopnea.  She is not having any jaw or arm discomfort.  She is  not describing any palpitations, presyncope, or syncope.   PAST MEDICAL HISTORY:  Coronary artery disease (catheterization 2001with  left main 70% stenosis, LAD 70% followed by 90% stenosis, circumflex 99%  ostial stenosis, right coronary artery occluded).  The patient had a  LIMA of the LAD, SVG to obtuse marginal, sequential SVG to the RV  marginal branch and the distal right coronary artery.  Her last  perfusion study was in 2002), well preserved ejection fraction, carotid  endarterectomy on the right in 2007, dyslipidemia, hypertension,  obesity.  (She has lost weight, 13 pounds since I last saw her).   ALLERGIES:  PENICILLIN, SULFA AND CODEINE.   MEDICATIONS:  1. Aspirin 81 mg daily.  2. Cozaar 50 mg daily.  3.  Atenolol 50 mg daily.  4. Multivitamin.  5. Magnesium.  6. Folic acid.  7. Simvastatin 40 mg daily.  8. Benzonatate 200 mg daily x10 days.  9. Triamterene/hydrochlorothiazide 37.5/25 (she has only been taking      this p.r.n.).  10.Recent course of Levaquin.   REVIEW OF SYSTEMS:  As stated in the HPI.  Otherwise negative for other  systems.   PHYSICAL EXAMINATION:  The patient is in no distress.  Blood Pressure:  148/60.  Heart rate:  47, regular.  Weight:  226 pounds, body mass index  34.  HEENT:  Eyes unremarkable.  Pupils equal, round and reactive to light.  Fundi not visualized.  Oral mucosa unremarkable.  NECK:  No jugular venous distension at 45 degrees.  Carotid upstroke  brisk and symmetric.  Right carotid endarterectomy scar.  No  thyromegaly.  Left carotid bruit.  LYMPHATICS:  No cervical, axillary, inguinal lymphadenopathy.  LUNGS:  Clear to auscultation bilaterally.  BACK:  No costovertebral angle tenderness.  CHEST:  Well healed sternotomy scar.  HEART:  PMI nondisplaced or sustained.  S1 and S2 within normal limits.  No S3, no S4.  No clicks, rubs or murmurs.  ABDOMEN:  Obese.  Positive bowel sounds normal in frequency and pitch.  No bruits.  No rebound or guarding.  No midline pulsate mass.  No  hepatomegaly.  No splenomegaly.  SKIN:  No rashes.  No nodules.  EXTREMITIES:  2+ pulses throughout.  No edema.  No cyanosis.  No  clubbing.  NEURO:  Oriented to person, place and time.  Cranial nerves II-XII  grossly intact.  Motor grossly intact.   EKG:  Sinus bradycardia, rate 47, axis within normal limits, intervals  within normal limits.  No acute ST-T wave changes.   ASSESSMENT/PLAN:  1. Coronary disease.  The patient has coronary disease as described.      She is having some chest tightness which may be related to her      respiratory problems.  However, it has been seven years since her      last perfusion study.  The possibility of obstructive coronary       disease is at least moderate.  She needs screening with an exercise      perfusion study.  2. Hypertension.  Blood pressure is slightly elevated.  It turns out      she is not taking her Dyazide daily.  I encouraged her to do this.      She is also encouraged to continue to lose weight and this will      bring her to a goal blood pressure of less than 140/90 with an      ideal of 120s/70s.  3. Dyslipidemia per Dr. Sherril Croon.  A goal would be an LDL of less than 100      and an HDL greater than 50.  4. Peripheral vascular disease.  The patient has her carotids followed      by Dr. Madilyn Fireman.  5. Obesity.  I applaud her weight loss and encourage more of the same.  6. Followup.  I would like to see her back in one year if she is doing      well and has no problems on the stress perfusion study.     Rollene Rotunda, MD, [facility]  Electronically Signed    JH/MedQ  DD: 05/27/2007  DT: 05/27/2007  Job #: [id number]   cc:   Doreen Beam, MD

## 2010-10-02 NOTE — Procedures (Signed)
NAME:  Lori Price, Lori Price NO.:  [id number]   MEDICAL RECORD NO.:  [id number]          PATIENT TYPE:  OUT   LOCATION:  SLEEP CENTER                 FACILITY:  [facility]   PHYSICIAN:  Coralyn Helling, MD        DATE OF BIRTH:  11/06/1931   DATE OF STUDY:  01/03/2009                            NOCTURNAL POLYSOMNOGRAM   REFERRING PHYSICIAN:  Coralyn Helling, MD   INDICATION FOR STUDY:  Ms. Anthis is a 77 year old female who has a  history of coronary artery disease, nocturnal coughing, hypertension.  She also has symptoms of snoring, sleep disruption and excessive daytime  sleepiness.  She is referred to the sleep lab for evaluation of  hypersomnia with obstructive sleep apnea.   EPWORTH SLEEPINESS SCORE:  12.   MEDICATIONS:  Pantoprazole, simvastatin, fexofenadine, diclofenac,  atenolol, triamterene, aspirin, folic acid, magnesium, potassium, fish  oil, Dulcolax and ProAir.   SLEEP ARCHITECTURE:  The patient followed a split night study protocol.  During the diagnostic portion of the test, total recording time was 165  minutes.  Total sleep time was 144 minutes.  Sleep efficiency was 87%.  Sleep latency was 7 minutes.  REM latency was 72.5 minutes.  This  portion of the study was notable for reduction in the percentage of slow  wave sleep.  The patient slept predominately in the non supine position.   During the titration portion of study, total recording time was 205  minutes.  Total sleep time was 175 minutes.  Sleep efficiency was 86%.  Sleep latency was 1 minute.  REM latency was 69.5 minutes.  This portion  of the study was notable for lack of slow wave sleep.  The patient slept  in both, the supine and non supine positions.   RESPIRATORY DATA:  The average respiratory rate was 18.  Mild-to-  moderate snoring was noted by the technician.  The overall apnea-  hypopnea index was 16.  The events were exclusively obstructive in  nature.  The supine apnea-hypopnea index was 7.   The non supine apnea-  hypopnea index was 18.8.  The REM apnea-hypopnea index was 55.9.  The  non-REM apnea-hypopnea index was 6.  During the titration portion of the  study, the patient was titrated from a CPAP pressure setting of 5-13 cm  of water.  At a CPAP pressure setting of 13 cm of water, the apnea-  hypopnea index was reduced to 2.4.  At this pressure setting, the  patient was observed in REM sleep but not supine sleep.   OXYGEN DATA:  The baseline oxygenation was 98%.  The oxygen saturation  nadir was 81%.  With a CPAP pressure setting of 19 cm of water, the  oxygen saturation nadir was 91%.  The study was conducted without the  use of supplemental oxygen.   CARDIAC DATA:  The average heart rate was 46 and the rhythm strip showed  sinus rhythm with sinus bradycardia and occasional PVCs.   MOVEMENT-PARASOMNIA:  The periodic limb movement index was 87 and the  patient had 1 restroom trip.   IMPRESSIONS-RECOMMENDATIONS:  This study shows evidence for  moderate  obstructive sleep apnea.   The patient also had a significant increase in her periodic limb  movement index.   In addition to diet, exercise and weight reduction, the patient should  be initiated on CPAP at 13 cm of water and monitored for her clinical  response.      Coralyn Helling, MD  Diplomat, American Board of Sleep Medicine  Electronically Signed     VS/MEDQ  D:  01/05/2009 10:34:58  T:  01/05/2009 15:00:32  Job:  [id number]

## 2010-10-02 NOTE — Op Note (Signed)
NAME:  Lori Price, Lori Price NO.:  [id number]   MEDICAL RECORD NO.:  [id number]          PATIENT TYPE:  AMB   LOCATION:  DSC                          FACILITY:  [facility]   PHYSICIAN:  Cindee Salt, M.D.       DATE OF BIRTH:  01-01-1932   DATE OF PROCEDURE:  DATE OF DISCHARGE:                               OPERATIVE REPORT   PREOPERATIVE DIAGNOSIS:  Carpal tunnel syndrome, left hand.   POSTOPERATIVE DIAGNOSIS:  Carpal tunnel syndrome, left hand.   OPERATION:  Decompression of left median nerve.   SURGEON:  Cindee Salt, MD   ASSISTANT:  Carolyne Fiscal, RN   ANESTHESIA:  Forearm-based IV regional.   DATE OF OPERATION:  November 10, 2007   ANESTHESIOLOGIST:  Zenon Mayo, MD.   HISTORY:  The patient is a 77 year old female with a history of carpal  tunnel syndrome, EMG/nerve conduction are positive.  This has not  responded to conservative treatment.  She has elected to undergo  surgical release.  Pre, peri, and postoperative course have been  discussed along with risks and complications.  She is aware that there  is no guarantee with the surgery, possibility of infection, recurrence,  injury to arteries, nerves, tendons, incomplete relief of symptoms, and  dystrophy.  Preoperative area, the patient is seen, questions encouraged  and answered.  The extremity marked by both the patient and surgeon.  Antibiotic given.   PROCEDURE:  The patient is brought to the operating room where forearm-  based IV regional anesthetic was carried out without difficulty.  She  was prepped using DuraPrep in the supine position with the left arm  free.  A time-out was taken.  Following this, a longitudinal incision  was made in the palm, carried down through subcutaneous tissue.  Bleeders were electrocauterized, palmar fascia was split, superficial  palmar arch identified, flexor tendon of the ring and little finger  identified to the ulnar side of the median nerve.  Carpal retinaculum  was  incised with sharp dissection.  Right angle and Sewall retractor  were placed between the skin and forearm fascia.  The fascia was  released for approximately a 1.5-cm proximal to the wrist crease under  direct vision.  Canal was explored.  A varicosity was noted in  superficial tissues.  This was coagulated on either side, excised, and  sent to pathology.  The area of compression to the nerve was apparent.  No further lesions were identified.  The wound was irrigated.  Skin was  then closed with interrupted 5-0 Vicryl Rapide sutures.  Sterile  compressive dressing and splint  to the wrist was applied.  The patient tolerated the procedure well and  was taken to the recovery room for observation in satisfactory  condition.  She will be discharged home to return to the [facility] in 1 week, on [place] [address].           ______________________________  Cindee Salt, M.D.     GK/MEDQ  D:  11/10/2007  T:  11/11/2007  Job:  [id number]

## 2010-10-02 NOTE — Assessment & Plan Note (Signed)
[facility]                            CARDIOLOGY OFFICE NOTE   YOMARIS, PALECEK                            MRN:          [id number]  DATE:10/22/2006                            DOB:          08-15-1931    PRIMARY CARE PHYSICIAN:  Dr. Doreen Beam.   REASON FOR PRESENTATION:  Evaluate patient with coronary disease.   HISTORY OF PRESENT ILLNESS:  Patient is switching her care to this  office. She has a history of vascular disease as described below. She  last saw Dr. Samule Ohm in January of 2007 prior to a carotid  endarterectomy. She did well with this surgery. She has since done well.  She does not exercise routinely but she does do yard work. She takes  care of young grandchildren. With this she denies any chest discomfort,  neck discomfort, arm discomfort, activity induced nausea, vomiting, or  excessive diaphoresis. She has had no palpitations, pre syncope, or  syncope. She has had no PND or orthopnea.   PAST MEDICAL HISTORY:  Coronary artery disease (catheterization 2001  with a left main 70% stenosis, LAD 70% followed by 90% stenosis,  circumflex 99% ostial stenosis, right coronary artery occlusion. The  patient had LIMA to the LAD, SVG to an obtuse marginal, sequential SVG  to an RV marginal branch and distal right coronary artery), well  preserved ejection fraction, carotid endarterectomy on the right in  2007. Dyslipidemia, hypertension, obesity.   ALLERGIES:  PENICILLIN, SULFA, AND CODEINE.   MEDICATIONS:  1. Aspirin 81 mg daily.  2. Cozaar 50 mg b.i.d.  3. Atenolol 50 mg daily.  4. Multivitamin.  5. Magnesium 800 mg daily.  6. Folic acid.  7. Crestor 10 mg daily.   REVIEW OF SYSTEMS:  As stated in the HPI and otherwise negative for  other systems.   PHYSICAL EXAMINATION:  The patient is in no distress. Blood pressure  150/88, heart rate 58 and regular, body mass index 36, weight 239  pounds.  HEENT: Eye lids unremarkable, pupils  equal, round, and reactive to  light. Fundi not visualized. Oral mucosa unremarkable.  NECK: No jugular venous distension at 45 degrees. Right carotid  endarterectomy scar. No bruits or thyromegaly.  LYMPHATICS: No cervical, axillary, inguinal adenopathy.  LUNGS: Clear to auscultation bilaterally.  CHEST: Unremarkable.  HEART: PMI not displaced or sustained. S1, and S2 within normal limits.  No S3, no S4, clicks, rubs, murmurs.  ABDOMEN: Obese, positive bowel sounds normal to frequency and pitch. No  bruits, rebound, or guarding. No midline pulsatile mass. No  hepatomegaly, no splenomegaly.  SKIN: No rashes. No nodules.  EXTREMITIES: 2 + pulses throughout. No edema, cyanosis, or clubbing.  NEURO: Oriented to person, place, and time. Cranial nerves II-XII  grossly intact, motor grossly intact.   EKG: Sinus rhythm, rate 58, axis within normal limits, intervals within  normal limits. No acute ST-T wave changes.   ASSESSMENT/PLAN:  1. Coronary disease, patient is having no new symptoms. It has been 7      years since her bypass and 6 years since her  last perfusion study.      I would have a low threshold for another stress perfusion study and      I have discussed this with her. We will consider this in the fall      when she comes back to see me. She will continue with secondary      risk reduction.  2. Hypertension, blood pressure is slightly elevated. She is going to      keep a blood pressure diary. I probably would add a low dose of a      diuretic daily if her blood pressure is still not under control. We      also discussed weight loss to bring this down to target.  3. Dyslipidemia, I switched her to simvastatin 20 mg daily for cost.      She is going to get a lipid and liver profile done at Dr. Bonnita Levan      office in about 8 weeks.  4. Obesity, we had a long discussion (greater than a half hour over      this appointment) about weight loss with diet and exercise. I       prescribed the [facility] Diet. Hopefully she will comply with      this.  5. I would like to see her in 6 months or sooner if needed. Again I      will discuss with her stress testing at that time. She knows to      call me if she has any increasing symptoms.     Rollene Rotunda, MD, [facility]  Electronically Signed    JH/MedQ  DD: 10/22/2006  DT: 10/22/2006  Job #: [id number]   cc:   Doreen Beam

## 2010-10-02 NOTE — Procedures (Signed)
CAROTID DUPLEX EXAM   INDICATION:  Followup of CVD.  Patient is asymptomatic.   HISTORY:  Diabetes:  No.  Cardiac:  MI in 2001, CABG in 2001 by Dr. Donata Clay.  Hypertension:  Yes.  Smoking:  No.  Previous Surgery:  Right CEA with DPA on 05/22/05 by Dr. Madilyn Fireman.  CV History:  No.  Amaurosis Fugax No, Paresthesias No, Hemiparesis No                                       RIGHT             LEFT  Brachial systolic pressure:         158               162  Brachial Doppler waveforms:         Within normal limits                Within normal limits  Vertebral direction of flow:        Antegrade         Antegrade  DUPLEX VELOCITIES (cm/sec)  CCA peak systolic                   71                77  ECA peak systolic                   59                60  ICA peak systolic                   73                81  ICA end diastolic                   18                23  PLAQUE MORPHOLOGY:                  Heterogenous      Mixed  PLAQUE AMOUNT:                      Mild              Mild  PLAQUE LOCATION:                    ICA               ICA   IMPRESSION:  1. No right internal carotid artery stenosis, status post      endarterectomy.  2. Left internal carotid artery with a 1-39% stenosis, which is      stable.  3. Bilateral vertebral and external carotid arteries are within normal      limits.   ___________________________________________  P.Bud Face M.D.  PB/MEDQ  D:  02/19/2007  T:  02/20/2007  Job:  [id number]

## 2010-10-02 NOTE — Assessment & Plan Note (Signed)
[facility]                            CARDIOLOGY OFFICE NOTE   Lori Price, Lori Price                          MRN:          [id number]  DATE:06/22/2008                            DOB:          06/06/1931    PRIMARY CARE PHYSICIAN:  Doreen Beam, MD   REASON FOR PRESENTATION:  Evaluate the patient with coronary artery  disease.   HISTORY OF PRESENT ILLNESS:  The patient is now 77 years old.  She has  done well since I last saw her.  She has not been as active as I would  like, because her husband already has Achillis tendon, and she has had  to care for him.  With her level of activity such as going up and down  the stairs frequently, she has not been getting any chest discomfort,  neck or arm discomfort.  She has not been having any palpitations,  presyncope, or syncope.  Of note, last year, I did send her for a stress  perfusion study and was pleased to see that this demonstrated no high  risk features.  She did have an EF of 63% with basal inferior and  inferolateral scar.   The patient's biggest complaint is a cough that has been chronic.  It is  nonproductive.  She saw an ENT who did not think that it was related to  any ENT problems, but suggested perhaps reflux, asthma, or less likely  her Cozaar.   PAST MEDICAL HISTORY:  1. Coronary artery disease (catheterizations in 2001 with left main      70% stenosis, LAD 70% followed by 90% stenosis, circumflex 99%      ostial stenosis, right coronary artery occluded).  2. CABG (LIMA to the LAD, SVG to obtuse marginal, sequential SVG to      RV, marginal branch in the distal right coronary artery).  3. Well-preserved ejection fraction.  4. Carotid endarterectomy on the right in 2007.  5. Dyslipidemia.  6. Hypertension.  7. Obesity.   ALLERGIES:  PENICILLIN, SULFA, AND CODEINE.   MEDICATIONS:  1. Aspirin 81 mg daily.  2. Cozaar 50 mg b.i.d.  3. Atenolol 50 mg daily.  4. Multivitamin.  5.  Magnesium 800 mg daily.  6. Folic acid.  7. Triamterene/hydrochlorothiazide 37.5/25.  8. Simvastatin 40 mg daily.  9. Pantoprazole 40 mg b.i.d.  10.Fenofibrate 54 mg daily.  11.Allegra.   REVIEW OF SYSTEMS:  As stated in the HPI, negative for all other  systems.   PHYSICAL EXAMINATION:  GENERAL:  The patient is pleasant and in no  distress.  VITAL SIGNS:  Blood pressure 124/68, heart rate 66 and regular, weight  230 pounds, body mass index 35.  HEENT:  Eyes unremarkable; pupils equal, round, and reactive to light;  fundi not visualized.  Oral mucosa unremarkable.  NECK:  No jugular venous distention at 45 degrees; carotid upstroke  brisk and symmetric; right greater than left carotid bruits; right  carotid endarterectomy scar, no thyromegaly.  LYMPHATICS:  No cervical, axillary, or inguinal adenopathy.  LUNGS:  Clear to auscultation bilaterally.  BACK:  No costovertebral angle tenderness.  CHEST:  Well-healed sternotomy scar.  HEART:  PMI not displaced or sustained.  S1 and S2 within normal limits.  No S3, no S4.  No clicks, no rubs, no murmurs.  ABDOMEN:  Obese;  positive bowel sounds.  Normal in frequency and pitch.  No bruits, no  rebound, no guarding, no midline pulsatile mass.  No hepatomegaly,  splenomegaly.  SKIN:  No rashes.  No nodules.  EXTREMITIES:  Pulses 2+ throughout; right leg size is chronically larger  than left related to saphenous vein graft harvest site and previous  muscle atrophy from accident years and years ago.  She has well-healed  right saphenous vein graft harvest scars.  NEUROLOGIC:  Oriented to person, place, and time.  Cranial nerves II  through XII grossly intact.  Motor grossly intact.   EKG:  Sinus bradycardia, rate 59, axis within normal limits, intervals  within normal limits, no acute ST-T wave changes.   ASSESSMENT AND PLAN:  1. Coronary artery disease.  The patient has had no new chest pain.      She has no need for further  cardiovascular testing as she had a      negative stress test last year.  She needs continued aggressive      secondary risk reduction.  2. Cough.  This could be the Cozaar even though she has been taking      this for years.  I have told her how to wean herself off of this      drug and to stay off of it for a month to see if her cough      improves.  She can keep an eye on her blood pressure with a cuff at      home.  She may need some adjustment to her medications, though I      doubt this would be a significant problem in the short term.  If      her cough is no better, she will resume the Cozaar.  3. Dyslipidemia.  I had her get some labs few weeks ago.  I have      reviewed these.  This demonstrated stable BUN and creatinine of 45      and 1.3.  Her liver enzymes were fine.  However, her triglycerides      were 447 with a total cholesterol of 200.  The HDL was 30.  I      therefore called in a prescription for 54 mg daily of fenofibrate.      This is in addition to her Zocor 40 mg.  She is to get lipid and      liver test in 6 more weeks.  I suspect she will need dose      adjustment.  4. Obesity.  She understands the need to lose weight with diet and      exercise.  5. Hypertension.  Again we will watch this as she comes off the Cozaar      for a trial.  6. Peripheral vascular disease.  She has not had her carotids looked      at but missed an appointment with the vascular surgeons, and I have      encouraged her to call and reschedule that.  7. Followup.  I would see her yearly unless there are any acute      problems or issues, other issues that need to be addressed.  I will  be following up over the phone on her lipids.     Rollene Rotunda, MD, [facility]  Electronically Signed    JH/MedQ  DD: 06/22/2008  DT: 06/23/2008  Job #: [id number]   cc:   Doreen Beam, MD

## 2010-10-05 ENCOUNTER — Encounter: Payer: Self-pay | Admitting: Cardiology

## 2010-10-05 NOTE — Op Note (Signed)
NAME:  Lori Price, DERRICK NO.:  [id number]   MEDICAL RECORD NO.:  [id number]          PATIENT TYPE:  INP   LOCATION:  3310                         FACILITY:  [facility]   PHYSICIAN:  Balinda Quails, M.D.    DATE OF BIRTH:  [birth year]   DATE OF PROCEDURE:  05/22/2005  DATE OF DISCHARGE:                                 OPERATIVE REPORT   SURGEON:  Denman George, MD.   ASSISTANT:  Jerold Coombe, P.A.   ANESTHETIC:  General endotracheal.   ANESTHESIOLOGIST:  Dr. Noreene Larsson.   PREOPERATIVE DIAGNOSIS:  Severe right internal carotid artery stenosis.   POSTOPERATIVE DIAGNOSIS:  Severe right internal carotid artery stenosis.   PROCEDURE:  Right carotid endarterectomy, Dacron patch angioplasty.   CLINICAL NOTE:  Lori Price is a 77 year old female referred for evaluation  after discovery of a severe right internal carotid artery stenosis. She was  seen and evaluated in the office. She consented to right carotid  endarterectomy for reduction of stroke risk.   OPERATIVE PROCEDURE:  The patient left the operating room stable condition.  Placed supine position. A general endotracheal anesthesia induced. Foley  catheter was in place. Right neck prepped and draped in sterile fashion.   Curvilinear skin incision made along the upper border of the right  sternomastoid muscle. Dissection carried through the subcutaneous tissue and  platysma with electrocautery. Deep dissection carried down to expose the  carotid bifurcation. Facial vein ligated with 2-0 silk and divided. Common  carotid artery mobilized down to the omohyoid muscle. The vagus nerve  reflected posteriorly and preserved. The superior thyroid and external  carotid were freed and encircled with vessel loops. The internal carotid  artery then followed distally up to the posterior belly of the digastric  muscle and encircled with vessel loop.   The carotid bifurcation revealed plaque disease primarily at the origin of  the right internal carotid artery. There was a large space-occupying plaque.   The patient administered 7000 units heparin intravenously. Adequate  circulation time permitted. The carotid vessels controlled with clamps.  Longitudinal arteriotomy made in the distal common carotid artery. The  arteriotomy extended across carotid bulb and up into the internal carotid  artery. There was a large plaque at the origin of right internal carotid  artery. Early ulceration noted. A high-grade stenosis.   Shunt was then inserted.   An endarterectomy then carried out. Plaque raised down into the common  carotid artery. The plaque was divided transversely with Potts scissors.  Plaque then raised up into the bulb of the superior thyroid and external  carotid were endarterectomized using an eversion technique. The distal  internal carotid artery and plaque then feathered out. Fragments plaque  removed with fine forceps. The site irrigated with heparin saline solution.   The patch angioplasty to the endarterectomy site was carried out using  running 6-0 Prolene suture with a Finesse Dacron patch. At completion of the  patch angioplasty, all vessels well flushed. Initial antegrade flow then  directed up the external carotid artery. The internal carotid then released.  Excellent  pulse and Doppler signal in the distal internal carotid artery.   Adequate hemostasis obtained. The patient administered 50 milligrams  protamine intravenously. Sponge and instrument counts correct.   Sternomastoid fascia closed with running 2-0 Vicryl suture. Platysma closed  with running 3-0 Vicryl suture. Skin closed with 4-0 Monocryl. Steri-Strips  applied.   The patient tolerated procedure well. Transferred to recovery in stable  condition. Neurologically intact.      Balinda Quails, M.D.  Electronically Signed     PGH/MEDQ  D:  05/23/2005  T:  05/23/2005  Job:  [id number]   cc:   Doreen Beam  Fax: [phone]    Salvadore Farber, M.D. [facility]  [address]  [address]  [place]  [place] [address]

## 2010-10-05 NOTE — Discharge Summary (Signed)
NAME:  KIMARA, BENCOMO NO.:  [id number]   MEDICAL RECORD NO.:  [id number]          PATIENT TYPE:  INP   LOCATION:  3310                         FACILITY:  [facility]   PHYSICIAN:  Balinda Quails, M.D.    DATE OF BIRTH:  1932-01-04   DATE OF ADMISSION:  05/22/2005  DATE OF DISCHARGE:  05/23/2005                                 DISCHARGE SUMMARY   HISTORY OF THE PRESENT ILLNESS:  The patient is a 77 year old Caucasian  female with a known asymptomatic right internal carotid artery stenosis  since November of 2005.  The patient had been followed with serial Dopplers  and was recently noted to have increase in her stenosis to the 80% to 99%  range.  An MR angiogram revealed a right internal carotid artery stenosis of  approximately 70%.  In light of the fact that the patient was asymptomatic,  she was continued to be followed.  She then recently had a Doppler study in  September of 2006 which revealed progression to greater than 80% stenosis.  This was followed up with a CT angiogram which revealed an 80% to 85%  proximal right internal carotid artery stenosis.  Due to the progression of  her disease, Dr. Madilyn Fireman recommended a right carotid endarterectomy and she  was admitted this hospitalization for the procedure.   PAST MEDICAL HISTORY:  1.  Extracranial cerebrovascular occlusive disease.  2.  Coronary artery disease.  3.  Hyperlipidemia.  4.  Hypertension.  5.  Obesity.  6.  History of vertigo.  7.  History of left-handed numbness for approximately 35 years.   PAST SURGICAL HISTORY:  1.  Coronary artery bypass grafting in 2001.  2.  Tonsillectomy.  3.  Left leg fracture repair.   ALLERGIES:  SULFA, PENICILLIN, CODEINE and EGGS.   MEDICATIONS PRIOR TO ADMISSION:  1.  Aspirin 81 mg daily.  2.  Atenolol 50 mg daily.  3.  Cozaar 50 mg twice daily.  4.  Lipitor 20 mg daily.  5.  Folic acid daily.   FAMILY HISTORY:  Please see the history and physical done at the  time of  admission.   SOCIAL HISTORY:  Please see the history and physical done at the time of  admission.   REVIEW OF SYSTEMS:  Please see the history and physical done at the time of  admission.   PHYSICAL EXAMINATION:  Please see the history and physical done at the time  of admission.   HOSPITAL COURSE:  The patient was admitted electively and taken to the  operating room on May 22, 2005, at which time she underwent the following  procedure:  Right carotid endarterectomy with Dacron patch angioplasty.  The  patient tolerated the procedure well and was taken to the postanesthesia  care unit in stable condition.   POSTOPERATIVE HOSPITAL COURSE:  The patient did well.  She remained  neurologically intact with no new findings.  The patient's laboratory values  were stable postoperatively.  Incision did well without evidence of hematoma  or infection.  She advanced in a routine  manner regarding her activities per  usual protocols.  Overall, she was deemed to be stable for discharge on  May 23, 2005.   CONDITION ON DISCHARGE:  Stable and improving.   FINAL DIAGNOSES:  1.  Severe right internal carotid artery stenosis, now status post carotid      endarterectomy with Dacron patch angioplasty.  2.  Other diagnoses as previously listed per the history.   CONDITION ON DISCHARGE:  Stable and improved.   INSTRUCTIONS:  The patient received written instructions in regard to  medications, activity, diet, wound care and followup.   FOLLOWUP:  Followup will include Dr. Madilyn Fireman 2 weeks post discharge with the  office to call the appointment time.      Rowe Clack, P.A.-C.      Balinda Quails, M.D.  Electronically Signed    WEG/MEDQ  D:  07/10/2005  T:  07/11/2005  Job:  [id number]

## 2010-10-05 NOTE — Discharge Summary (Signed)
[place]  Patient:    Lori Price, Lori Price Visit Number: [id number] MRN: [id number]          Service Type: MED Location: [phone] Attending Physician:  Daisey Must Dictated by:   Brita Romp, P.A.-C Admit Date:  02/24/2001 Discharge Date: 02/25/2001                             Discharge Summary  DISCHARGE DIAGNOSES: 1. Chest pain, INCOMPLETE. Dictated by:   Brita Romp, P.A.-C Attending Physician:  Daisey Must DD:  04/10/01 TD:  04/11/01 Job: [id number]

## 2010-10-05 NOTE — H&P (Signed)
NAME:  OWEN, PAGNOTTA NO.:  [id number]   MEDICAL RECORD NO.:  [id number]            PATIENT TYPE:   LOCATION:                                 FACILITY:   PHYSICIAN:  Balinda Quails, M.D.    DATE OF BIRTH:  [birth year]   DATE OF ADMISSION:  05/22/2005  DATE OF DISCHARGE:                                HISTORY & PHYSICAL   PRIMARY CARE PHYSICIAN:  Doreen Beam, M.D.   CARDIOLOGIST:  Salvadore Farber, M.D.   CHIEF COMPLAINT:  Right internal carotid artery stenosis.   HISTORY OF PRESENT ILLNESS:  This is a 77 year old Caucasian female with  known asymptomatic right internal carotid artery stenosis since November  2005.  The patient has been followed with serial Dopplers and was initially  found to have a stenosis between the 80%-99% range.  She denied any history  of a CVA, as well as signs and symptoms of TIA.  The patient then underwent  an MR angiogram which revealed a right internal carotid artery stenosis of  approximately 70%.  In light of the fact that the patient was asymptomatic,  this has been followed.  She underwent an additional repeat Doppler in  September 2006, which revealed progression to greater than 80% stenosis.  This was followed up with a CT angiogram which verified an 80%-85% proximal  right internal carotid artery stenosis.  Secondary to progression of  disease, Dr. Balinda Quails has recommended a right carotid endarterectomy.  The patient denies headache, nausea, vomiting, dizziness, falls, seizures,  numbness, tingling, muscle weakness, dysarthria, dysphagia or visual  changes, syncope, memory loss and confusion.  The patient's only complaint  is of occasional vertigo that is only present when she has inner ear  problems or sinus problems.  The patient presents to the CVTS Office today  for a history and physical examination prior to surgery.   PAST MEDICAL HISTORY:  1.  Extracranial cerebrovascular occlusive disease.  2.  Coronary artery  disease, status post myocardial infarction, status post      coronary artery bypass graft surgery.  3.  Hyperlipidemia.  4.  Hypertension.  5.  Obesity.  6.  History of vertigo.  7.  Left hand numbness, present for approximately 35 years.   PAST SURGICAL HISTORY:  1.  Coronary artery bypass graft surgery in 2001.  2.  Tonsillectomy in 1939.  3.  Left leg fracture repair.   ALLERGIES:  SULFA, PENICILLIN, CODEINE, EGGS.   MEDICATIONS:  1.  Aspirin 81 mg.  2.  Atenolol 50 mg daily.  3.  Cozaar 50 mg b.i.d.  4.  Lipitor 20 mg daily.  5.  Folic acid daily.   REVIEW OF SYSTEMS:  Please see the HPI for significant positives and  negative.  Otherwise negative for kidney disease and diabetes mellitus.   SOCIAL HISTORY:  This is a married female with five children, who lives with  her family.  She denies tobacco use, as well as alcohol use.  She is  retired.  She does continue to drive.  FAMILY HISTORY:  Mother cancer.  Father heart disease and stroke.   PHYSICAL EXAMINATION:  VITAL SIGNS:  Blood pressure 140/70 left arm sitting,  heart rate 68, respirations 18.  GENERAL:  This is a 77 year old Caucasian female, in no acute distress.  HEENT:  Normocephalic and atraumatic.  Pupils equal, round, reactive to  light and accommodation.  Extraocular movements are intact.  Oral mucosa is  pink and moist.  They are anicteric.  NECK:  The neck is supple with no jugular venous distention.  No bruits and  no lymphadenopathy.  Carotids are palpable.  LUNGS:  Respirations are symmetrical and unlabored and clear.  CHEST:  There is a well-healed median sternotomy scar present.  HEART:  A regular rate and rhythm.  ABDOMEN:  Soft, nontender.  Normoactive bowel sounds.  Obese.  GENITOURINARY:  Deferred.  RECTAL:  Deferred.  EXTREMITIES:  There is no edema, varicosities or venous stasis changes  present.  Temperature is warm.  There is a venectomy scar present on the  right lower extremity.   NEUROLOGIC:  Motor and sensation are intact in the bilateral hands and grip  is 2+ and equal bilaterally.  Exam is nonfocal.  The patient is alert and  oriented x3.  The gait is steady.  Muscle strength is 5/5 throughout all  extremities and symmetric.  Deep tendon reflexes are 2+.  PULSES:  Radial, femoral, popliteal and posterior tibial are 2+ bilaterally.   ASSESSMENT:  Right internal carotid artery stenosis.   PLAN:  Right carotid endarterectomy by Dr. Balinda Quails on May 22, 2005.  Dr. Madilyn Fireman has seen and evaluated the patient prior to this admission and has  explained the risks and benefits of the procedure and the patient has agreed  to continue.      Pecola Leisure, PA      P. Liliane Bade, M.D.  Electronically Signed    AY/MEDQ  D:  05/17/2005  T:  05/17/2005  Job:  [id number]

## 2010-10-05 NOTE — Op Note (Signed)
[place]. [facility]  Patient:    Lori Price, Lori Price                            MRN: [id number] Proc. Date: 07/06/99 Adm. Date:  [id number] Attending:  Mikey Bussing CC:         Madolyn Frieze. Jens Som, M.D. [facility]                           Operative Report  OPERATION: Emergency coronary artery bypass grafting x 4 (left internal mammary  artery to the left anterior descending, saphenous vein graft to the obtuse marginal 1, sequential saphenous vein graft to the RV marginal and distal right coronary  artery).  PREOPERATIVE DIAGNOSIS:  Acute diaphragmatic myocardial infarction with persistent unstable angina and unstable hemodynamics with preoperative intra-aortic balloon pump placement in the catheterization laboratory.  SURGEON: Mikey Bussing, M.D.  ASSISTANT: Loura Pardon, P.A.-C.            Sherrie George, P.A.-C.  ANESTHESIA: General, by Dr. Hart Robinsons.  INDICATIONS: The patient is a 77 year old white female without prior history of  cardiac disease who presented with acute onset of chest pain.  She was found to  have sustained a recent inferior wall myocardial infarction.  Emergency cardiac  catheterization demonstrated total occlusion of the right coronary artery with elevated right heart pressures indicative of RV dysfunction.  She also had a 70% left main, 90% LAD and a 99% circumflex lesion.  Because of her severe coronary  disease, persistent chest pain and unstable hemodynamics she was referred for emergency coronary artery bypass grafting after the intra-aortic balloon pump was placed.  The situation was discussed with the patient in the cardiac catheterization laboratory as well as the family in the catheterization laboratory waiting area.  I discussed the details of her situation with her husband, indications and benefits of surgery, as well as the risks of the operation.  The patient and the husband  both agreed to  proceed with emergency surgery.  They understood alternatives to  surgical therapy as well.  DESCRIPTION OF PROCEDURE: The patient was brought directly to the operating room where, after invasive monitoring, lines were placed.  She was induced with general anesthesia and prepped and draped in a sterile field.  A median sternotomy was performed.  The saphenous vein was harvested from the right lower extremity. It was of average quality.  The left internal mammary artery was harvested as a pedicle graft from its origin at the subclavian vessels and was a small but good vessel  with excellent flow.  Heparin was administered systemically and the ACT was documented as being therapeutic.  The pericardium was opened and purse string was placed in the ascending aorta and right atrium. The patient was cannulated and placed on cardiopulmonary bypass and cooled to 32 degrees.  The coronaries were  identified and the mammary artery and saphenous vein grafts were prepared for the distal anastomoses.   The cardioplegic cannula was replaced for both antegrade nd retrograde delivery of cold blood cardioplegia and the patient was cooled to 28  degrees.  Aortic crossclamp was applied.  Then 700 cc of cold blood cardioplegia was delivered in a combination of antegrade and retrograde systems.  This resulted in immediate cardioplegic arrest and septal temperatures dropped to less than 12 degrees.  Topical iced saline slush was used to augment myocardial preservation  and the pericardial insulator pad was used to protect the left phrenic nerve.  The distal coronary anastomoses were then performed.  The first distal anastomosis was the graft to the OM1.  This was a 1.8 mm vessel, sclerotic, with an intramyocardial location and a proximal 99% stenosis. The reverse saphenous vein as sewn end-to-side with running 7-0 Prolene and there was good flow through the graft.  The second distal anastomosis  was a sequential vein graft to the RV marginal continuing to the distal RCA.  The RV marginal was a 1.5 mm vessel, proximal total occlusion.  A side branch of the vein was sewn side-to-side with a running 7-0 Prolene.  There was excellent flow through the graft.  The third distal anastomosis was a continuation of the sequential vein graft to the distal RCA.  This was a .8 mm vessel proximal total occlusion.  The end of the vein was sewn end-to-side with a running 7-0 Prolene.  There was excellent flow through this graft. Cardioplegia was redosed.  The fourth distal anastomosis was to the distal LAD which was a 1.5 mm vessel with proximal 90% stenosis.  The left internal mammary artery pedicle was brought through an opening created in the left lateral pericardium.  It was brought down on the LAD and sewn end-to-side with running 8-0 Prolene.  There was excellent flow through the anastomosis with immediate rise in septal temperature after release of the pedicle clamp on the mammary artery.  The mammary pedicle was secured to the epicardium and the aortic crossclamp was removed.  The heart was cardioverted back to a regular rhythm.  A partial occluding clamp was placed on the ascending aorta and two proximal vein anastomoses were performed using a 4.0 mm punch and running 6-0 Prolene sutures.  The partial clamp was removed and vein grafts were perfused.  Each had excellent flow.  Hemostasis was documented to the proximal and distal sites.  The patient was rewarmed to 37 degrees and temporary pacing wires were applied.  The lungs were expanded and the ventilators turned on.  The balloon pump was reinitiated and the pacing lead from cardiopulmonary bypass on low-dose dopamine with adequate hemodynamics and stable blood pressure.  Protamine was administered and the cannulas were removed.  The  patient developed a sinus rhythm and became much more hemodynamically stable. Protamine  was administered and the ACT was reversed to normal.  The mediastinum was irrigated with warm antibiotic irrigation and the leg incision was irrigated and closed in a standard fashion.  The pericardium was loosely reapproximated and two  mediastinal and left pleural chest tubes were placed and brought out through separate incisions.  The sternum was reapproximated with interrupted steel wire. The pectoralis fascia and subcutaneous layers were closed with running Vicryl. The skin was closed with a subcuticular and sterile dressings were applied.  Total cardiopulmonary bypass time was 135 minutes with aortic crossclamp time of 60 minutes.  CLINICAL NOTE: Exposure of the aorta and posterior aspect of the left ventricle was difficult due to the patients body habitus and the deep blades of the sternal retractor were required due to her truncal obesity. DD:  07/06/99 TD:  07/07/99 Job: [id number]

## 2010-10-05 NOTE — Consult Note (Signed)
[place]. [facility]  Patient:    Lori Price, Lori Price                            MRN: [id number] Proc. Date: 07/06/99 Adm. Date:  [id number] Attending:  Mikey Bussing CC:         CVTS             Madolyn Frieze. Jens Som, M.D. [facility]             Monica Becton, M.D.                          Consultation Report  REASON FOR CONSULTATION:  Acute myocardial infarction with unstable angina and severe three-vessel disease with balloon pump placement in the catheterization laboratory for hemodynamic instability.  HISTORY OF PRESENT ILLNESS:  Ms. Baksh is a 77 year old white female without history of cardiac disease who was admitted with an acute inferior wall MI and was found by emergency catheterization to have total occlusion of the proximal right coronary artery.  She started developing symptoms of chest pressure the night prior to admission, which was at times associated with nausea, vomiting, and diaphoresis. There was some radiation of the tight pressure chest pain to the jaw.  She was een by her primary care physician, Dr. Rondel Baton, where an EKG was abnormal and she was sent to the emergency room for cardiology evaluation late this morning.  In the  emergency department, she was found to have ST segment elevation inferiorly and  lateral ST and T-wave inversion in the lateral leads.  She was taken for emergency catheterization by Dr. Daisey Must, who demonstrated total occlusion of the proximal right coronary artery with 99% proximal circumflex stenosis and 70% left main and a 90% distal LAD stenosis with inferior wall akinesia and an overall ejection fraction of 50%.  Her pulmonary artery pressures by right heart catheterization were elevated at 35/20, with a right atrial pressure of 20 mmHg and cardiac output of 4 L/min.  Due to her persistent chest pain and intermittent drop in blood pressure, an intra-aortic balloon pump was placed in  the catheterization laboratory.  Because of her ongoing chest pain despite balloon pump support and her severe three-vessel disease, she was referred for emergency coronary bypass grafting.  PAST MEDICAL HISTORY:  The patient has a history of hyperlipidemia, but not on medications.  She is obese.  She has no current medications and no previous surgical history.  There is no history of chronic medical illnesses such as hypertension, diabetes, or arthritis.  She did fracture her left lower leg, which was treated with immobilization for several weeks for a fracture of the tibia and fibula.  CURRENT MEDICATIONS:  She denies any chronic medications.  ALLERGIES:  PENICILLIN causes a rash, a does SULFA.  She is also allergic to CODEINE.  SOCIAL HISTORY:  The patient is married and has five children.  She is a nonsmoker and denies significant alcohol intake.  She has an active lifestyle and travels  internationally with her husband.  FAMILY HISTORY:  Her father died of a stroke at age 63.  REVIEW OF SYSTEMS:  The patient denies any history of TIA or CVA.  She denies any fever, night sweats, or change in weight.  She does have episodes of vertigo. he does wear reading glasses.  She denies any dysphagia or active dental problems.  There  is no history of pneumonia or hemoptysis, but she has had intermittent bronchitis.  She denies any symptoms of congestive heart failure, but she does ave active angina.  She denies any abdominal pain, change in bowel habits, jaundice, or blood per rectum.  She denies any recent blood in her urine, but she does have  history of nephrolithiasis.  She denies any history of DVT, claudication, or peripheral edema.  There is no history of easy bruisibility or bleeding tendency. She denies any history of depression, insomnia, or diminished appetite.  There s no history of skin rash or lesion.  PHYSICAL EXAMINATION:  GENERAL:  She is 5 feet 8 inches  and weighs 240 pounds.  She is a middle-aged, obese, white female in the catheterization laboratory on the catheterization table with intra-aortic balloon pump in place, covered by drapes, with a sheath in her right groin.  She is conscious and alert and in moderate distress.  VITAL SIGNS:  Blood pressure 110/70, with a heart rate of 80 per minute.  HEENT:  Normocephalic.  Normal EOMs.  NECK:  Without bruit, thyromegaly, mass, or JVD.  CHEST:  With scattered rales.  CARDIAC:  Without S3 gallop, in a sinus rhythm.  There is no cardiac murmur present.  ABDOMEN:  Obese, soft, nontender, with normal bowel sounds.  EXTREMITIES:  Palpable pedal pulses.  There is no evidence of venous insufficiency, pedal edema, or skin rash.  NEUROLOGICAL:  She is alert and oriented x 3.  She has good upper extremity motor function.  Her legs were not tested due to her instrumentation in her groin vessels.  LABORATORY DATA:  I reviewed the coronary arteriograms with Dr. Loraine Leriche Pulsipher n the cardiac catheterization laboratory and I agree with his recommendation for emergency CABG for her acute MI, with severe three-vessel disease and persistent chest pain.  PLAN:  The patient was examined in the catheterization laboratory and the indications and expected benefits of emergency heart bypass surgery were discussed with the patient.  I also reviewed all of this information with her husband and  family.  I discussed with the patient the placement of the surgical incisions, he use of cardiopulmonary bypass, and general anesthesia.  The choice of conduit, nd the expected recovery.  I reviewed the risks of the operation with the patient, as well as her family, including the risks of MI, CVA, bleeding, infection, and death.  The patient and husband both agree to proceed with the operation as explained o them under informed consent. DD:  07/06/99 TD:  07/06/99 Job: [id number]

## 2010-10-05 NOTE — Cardiovascular Report (Signed)
[place]. [facility]  Patient:    Lori Price, Lori Price                            MRN: [id number] Proc. Date: 07/06/99 Adm. Date:  [id number] Attending:  Mikey Bussing CC:         Monica Becton, M.D.             Daisey Must, M.D. [facility]             Cardiac Catheterization Laboratory                        Cardiac Catheterization  PROCEDURES PERFORMED: 1. Right and left heart catheterization with coronary angiography, left    ventriculography, and abdominal aortography. 2. Intra-aortic balloon pump insertion via the right femoral artery.  INDICATIONS:  Lori Price is a 77 year old woman who has been having chest pain since two evenings ago.  Her pain became very severe yesterday afternoon persisting for six hours.  Since then she has had a low-grade continuous substernal chest pressure.  She presented to her primary care doctors office this morning and was referred to the emergency room as she had ECG changes on presentation.  We opted to proceed with emergent catheterization.  DESCRIPTION OF PROCEDURE:  A 7 French sheath was placed in the right femoral artery, 8 French sheath in the right femoral vein.  We used a Swan-Ganz catheter for the right heart catheterization and standard Judkins catheters for the left  heart catheterization.  Contrast was Omnipaque.  After a conclusion of the diagnostic catheterization, an 8 French intra-aortic balloon pump was placed via the renal failure.  There were no complications.  RESULTS:  HEMODYNAMICS:  Mean right atrial pressure is 16, right ventricular pressure is 32/19, pulmonary artery pressure 28/19.  Mean pulmonary capillary wedge pressure was 20, left ventricular pressure 106/20, aortic pressure 102/65.  There was no  aortic valve gradient.  LEFT VENTRICULOGRAM:  Left ventriculogram revealed mild akinesis of the posterolateral wall.  Ejection fraction is calculated at 65%.  No  mitral regurgitation.  ABDOMINAL AORTOGRAM:  Abdominal aortogram revealed patent abdominal aorta, renal arteries and iliac arteries bilaterally.  CORONARY ARTERIOGRAPHY:  (Right dominant).  Left main:  Left main has an ostial 25% stenosis and distal 70% stenosis extending into the origin of the LAD.  Left anterior descending:  The LAD as above has a 70% stenosis at its origin. he proximal vessel has a diffuse 25% stenosis.  In the mid to distal vessel was a focal 30% followed by a tubular 90% stenosis.  The LAD gives rise to three small diagonal branches.  The second and third diagonal branches each have 30% stenosis at their ostium.  Left circumflex:  The left circumflex is a large vessel which gives rise to a large OM-1 and a normal sized OM-2.  There was a 99% stenosis at the ostium of the left circumflex.  The mid circumflex is a 70% stenosis just after the origin of OM-1. OM-1 has a 40% stenosis.  Right coronary artery:  The right coronary artery is 100% occluded proximally with what appears to be a large thrombus.  There is TIMI-3 flow into the distal vessel. There are very faint left to right collaterals supplying the distal right coronary artery.  IMPRESSION: 1. Elevated right heart filling pressures with normal pulmonary artery pressures,    suspect secondary to right ventricular  infarct. 2. Left ventricular function characterized by mild posterolateral akinesis, but  preserved overall ejection fraction. 3. Three-vessel coronary artery disease as described.  The culprit lesion is the    total occlusion of the proximal right coronary artery.  However, there is    significant disease in the left main and left anterior descending artery and  critical ostial left circumflex disease.  PLAN:  These films were reviewed by Dr. Riley Kill and myself.  After discussion, e felt that emergent bypass surgery would be the best alternative to  provide complete revascularization in light of the severity of the stenosis in the left circumflex coronary and potential for worsening ischemia if she becomes hypotensive during  attempt at intervention of the right coronary artery.  Dr. Kathlee Nations Trigt was consulted and has agreed to take the patient for emergent bypass surgery.  An intra-aortic balloon pump was placed for hemodynamic support. DD:  07/06/99 TD:  07/07/99 Job: [id number]

## 2010-10-05 NOTE — Discharge Summary (Signed)
[place]  Patient:    Lori Price, Lori Price                            MRN: [id number] Adm. Date:  [id number] Disc. Date: [id number] Attending:  Mikey Bussing Dictator:   Sherrie George, P.A. CC:         Kathlee Nations Suann Larry, M.D.             Daisey Must, M.D. [facility]             Madolyn Frieze. Jens Som, M.D. [facility]             Monica Becton, M.D.                           Discharge Summary  DATE OF BIRTH:  1931/12/15  ADMISSION DIAGNOSES: 1. Unstable angina. 2. Hyperlipidemia.  DISCHARGE DIAGNOSES: 1. Acute inferior wall myocardial infarction with three vessel coronary artery    disease. 2. Hyperlipidemia. 3. Obesity.  PROCEDURES: 1. Cardiac catheterization July 06, 1999, by Dr. Loraine Leriche Pulsipher, revealing    total proximal RCA stenosis, 99% proximal circumflex stenosis, 70% left main,    90% distal LAD.  Inferior wall hypokinesis.  Ejection fraction 50%. 2. Emergent coronary artery bypass grafting x 4 with left internal mammary to the    LAD, saphenous vein graft to the OM, saphenous vein graft to the RV marginal  and distal RCA sequentially July 06, 1999. 3. Insertion of intra-aortic balloon pump July 06, 1999, in the catheterization    laboratory by Dr. Gerri Spore.  HISTORY OF PRESENT ILLNESS:  The patient is a 77 year old white female, a medical patient of Dr. Monica Becton.  The patient has a history of chest pain one year ago.  She had an exercise Cardiolite at Dr. Waunita Schooner office.  Those records are pending, but she reports that it was supposed to be okay.  Forty-eight hours prior to admission she noted increasing substernal chest pressure.  The night prior to admission she developed some nausea, vomiting, and diaphoresis.  She said she did have some radiation and discomfort into her jaw.  She saw her primary care physician, Dr. Christell Constant, on the a.m. of admission.  An EKG was noted to be abnormal, with T-wave  inversions in lead I and aVL.  She was sent by ambulance to [facility] for further evaluation.  She has continued to have some chest pressure. She received nitroglycerin en route and dropped her blood pressure, although it  responded to IV fluids.  She was subsequently seen in the emergency room by [facility] for evaluation of coronary artery disease.  PAST MEDICAL HISTORY:  Hyperlipidemia, treated with diet alone.  No prior surgeries or history of diabetes or hypertension.  ADMISSION MEDICATIONS:  None.  ALLERGIES:  PENICILLIN, SULFA, and CODEINE.  For further history and physical, please see the dictated note.  HOSPITAL COURSE:  The patient was initially seen in the emergency room by the [facility] service.  She was then taken to the cardiac catheterization laboratory emergently, at which time she was found to have severe three vessel coronary artery disease, as described above.  She was hypotensive, and continued to have chest pain throughout her catheterization, and an intra-aortic balloon pump was inserted at that time.  The patient was also seen emergently by Dr. Donata Clay. After looking at her anatomy,  it was his recommendation she should also undergo  emergent coronary artery bypass grafting.  The risks and benefits were discussed with the patient in detail.  Informed operative consent was obtained, and she was subsequently taken to the operating room for emergent coronary artery bypass grafting.  She underwent the procedure, also as described above, with four vessels being bypassed:  The left internal mammary, the LAD, the saphenous vein graft to the circumflex, and a sequential vein graft to the RV marginal and distal right  coronary artery.  The patient tolerated the procedure well, and returned to the  intensive care unit in satisfactory condition.  First postoperative morning, cardiac index was 2.3.  She was on dopamine at 8 drops and  dobutamine at 7 drops. Her chest x-ray was clear.  She was extubated, started on diuresis, and we started weaning her off the intra-aortic balloon pump.  She remained stable throughout he day.  She was extubated to a 4 L nasal cannula with good O2 saturations at 94%.  Cardiac index remained greater than 2.  Her intra-aortic balloon pump was removed off and removed, and mediastinal tubes were also removed.  The patient continued to make good progress, and was mobilized.  She was transferred to the floor, and started on progressive ambulation on July 10, 1999.  Her weight was up 10 pounds postoperatively, this was treated with daily diuresis.  She was placed on a beta blocker postoperatively, and has made slow but steady progress.  Her wounds all healed nicely.  There appears to be no arrhythmias since her surgery and, by July 13, 1999, it was Dr. Vincent Gros opinion that she was doing well.  If she had no further problems, she could be discharged home on the a.m. of July 14, 1999.  ACTIVITY:  Light to moderate as instructed.  No lifting over 10 pounds, no driving, no strenuous activity.  DISCHARGE MEDICATIONS: 1. Tenormin 25 mg q.12h. 2. Altace 2.5 mg q.d. 3. Coated aspirin 325 mg 1 q.d. after supper. 4. Lipitor 10 mg 1 q.d. h.s. 5. Allegra 60 mg b.i.d. 6. Tylox 1-2 p.o. q.4h. p.r.n.  DIET:  She will be instructed to maintain a low fat, low salt diet.  WOUND CARE:  She is to clean the wounds with plain soap and water.  She has subcuticular sutures, and has no need for suture removal.  FOLLOW-UP:  She is to return to see Dr. Daisey Must on July 30, 1999, at 11 a.m., with a chest x-ray at his office.  She will return to see Dr. Donata Clay on Friday, August 03, 1999, at 10 a.m.  DISCHARGE LABORATORY DATA:  Electrolytes normal.  BUN 15, creatinine 0.7, calcium 8.5.  White count 7.6, hemoglobin 10.9, hematocrit 30.6, platelets 271,000.  CONDITION ON  DISCHARGE:  Improving. DD:  07/13/99 TD:  07/15/99 Job: [id number]  [id number]

## 2010-10-08 ENCOUNTER — Other Ambulatory Visit: Payer: Self-pay | Admitting: Cardiology

## 2011-01-10 ENCOUNTER — Other Ambulatory Visit: Payer: Self-pay | Admitting: Cardiology

## 2011-02-11 ENCOUNTER — Other Ambulatory Visit: Payer: Self-pay | Admitting: Cardiovascular Disease

## 2011-02-14 LAB — POCT I-STAT, CHEM 8
BUN: 51 — ABNORMAL HIGH
Calcium, Ion: 1.23
Chloride: 105
Creatinine, Ser: 1.3 — ABNORMAL HIGH
Glucose, Bld: 102 — ABNORMAL HIGH
HCT: 37
Hemoglobin: 12.6
Potassium: 4.9
Sodium: 140
TCO2: 28

## 2011-02-14 LAB — BASIC METABOLIC PANEL
BUN: 47 — ABNORMAL HIGH
CO2: 28
Calcium: 9.5
Chloride: 106
Creatinine, Ser: 1.43 — ABNORMAL HIGH
GFR calc Af Amer: 44 — ABNORMAL LOW
GFR calc non Af Amer: 36 — ABNORMAL LOW
Glucose, Bld: 89
Potassium: 6 — ABNORMAL HIGH
Sodium: 139

## 2011-03-20 DIAGNOSIS — M1712 Unilateral primary osteoarthritis, left knee: Secondary | ICD-10-CM | POA: Insufficient documentation

## 2011-04-17 ENCOUNTER — Other Ambulatory Visit: Payer: Self-pay | Admitting: Pulmonary Disease

## 2011-06-04 ENCOUNTER — Telehealth: Payer: Self-pay | Admitting: Cardiology

## 2011-06-04 DIAGNOSIS — Z8679 Personal history of other diseases of the circulatory system: Secondary | ICD-10-CM | POA: Insufficient documentation

## 2011-06-04 DIAGNOSIS — R42 Dizziness and giddiness: Secondary | ICD-10-CM | POA: Insufficient documentation

## 2011-06-04 NOTE — Telephone Encounter (Signed)
LOV,12 faxed to [facility] @ [phone] 06/04/11/KM

## 2011-06-14 ENCOUNTER — Other Ambulatory Visit: Payer: Self-pay | Admitting: Cardiovascular Disease

## 2011-07-24 DIAGNOSIS — M65329 Trigger finger, unspecified index finger: Secondary | ICD-10-CM | POA: Insufficient documentation

## 2011-07-29 ENCOUNTER — Other Ambulatory Visit: Payer: Self-pay | Admitting: Pulmonary Disease

## 2011-09-18 ENCOUNTER — Other Ambulatory Visit: Payer: Medicare Other

## 2011-09-18 ENCOUNTER — Ambulatory Visit: Payer: Medicare Other | Admitting: Vascular Surgery

## 2011-09-25 ENCOUNTER — Encounter: Payer: Self-pay | Admitting: Cardiology

## 2011-09-25 ENCOUNTER — Ambulatory Visit (INDEPENDENT_AMBULATORY_CARE_PROVIDER_SITE_OTHER): Payer: Medicare Other | Admitting: Cardiology

## 2011-09-25 VITALS — BP 120/80 | HR 56 | Ht 67.0 in | Wt 225.0 lb

## 2011-09-25 DIAGNOSIS — I2510 Atherosclerotic heart disease of native coronary artery without angina pectoris: Secondary | ICD-10-CM

## 2011-09-25 MED ORDER — SIMVASTATIN 40 MG PO TABS: 40.0000 mg | ORAL_TABLET | Freq: Every day | ORAL | Status: DC

## 2011-09-25 MED ORDER — TRIAMTERENE-HCTZ 37.5-25 MG PO CAPS: 1.0000 | ORAL_CAPSULE | Freq: Every day | ORAL | Status: DC

## 2011-09-25 MED ORDER — ATENOLOL 50 MG PO TABS: 50.0000 mg | ORAL_TABLET | Freq: Every day | ORAL | Status: DC

## 2011-09-25 NOTE — Progress Notes (Signed)
HPI The patient presents for one year followup. Since I last saw her she has had no new cardiovascular complaints. She remains active although she is somewhat limited by knee pain.  Previous back pain is better. With her activities she denies any chest pressure, neck or arm discomfort. She has no palpitations, presyncope or syncope. She has had no PND or orthopnea. She has had no weight loss or edema.  Allergies  Allergen Reactions  . Ace Inhibitors   . Codeine   . Losartan Potassium   . Quinapril     REACTION: cough  . Sibutramine     REACTION: palpitations  . Sulfonamide Derivatives     Current Outpatient Prescriptions  Medication Sig Dispense Refill  . albuterol (PROVENTIL HFA) 108 (90 BASE) MCG/ACT inhaler Inhale 2 puffs into the lungs every 6 (six) hours as needed.        Marland Kitchen aspirin 81 MG tablet Take 81 mg by mouth daily.        . beclomethasone (QVAR) 80 MCG/ACT inhaler Inhale 2 puffs into the lungs as needed.       . bisacodyl (DULCOLAX) 5 MG EC tablet Take 5 mg by mouth every other day.        . diclofenac (VOLTAREN) 75 MG EC tablet Take 75 mg by mouth 2 (two) times daily.        Marland Kitchen DYAZIDE 37.5-25 MG per capsule TAKE (1) CAPSULE DAILY  30 each  6  . fexofenadine (ALLEGRA) 180 MG tablet Take 180 mg by mouth as needed.        . folic acid (FOLVITE) 400 MCG tablet Take 400 mcg by mouth daily.        Jodelle Green Aspart-Potassium Aspart (ASPARTATE MG & K) 90-90 MG CAPS Take 1 capsule by mouth 3 (three) times a week.        . Magnesium 250 MG TABS Take 1 tablet by mouth 2 (two) times daily.        . meclizine (ANTIVERT) 25 MG tablet Take 25 mg by mouth 3 (three) times daily as needed.        . Omega-3 Fatty Acids (FISH OIL) 1000 MG CAPS Take 1 capsule by mouth daily.        . TENORMIN 50 MG tablet TAKE 1 TABLET DAILY  30 each  6  . ZOCOR 40 MG tablet TAKE ONE TABLET AT BEDTIME  30 each  6  . pantoprazole (PROTONIX) 40 MG tablet Take 40 mg by mouth 2 (two) times daily.          Past  Medical History  Diagnosis Date  . Coronary artery disease     s/p MI 2001--(catheterizations in 2001 with left main 70% stenosis, LAD 70% followed by 90% stenosis, circumflex 99% stenosis, right coronary artery occluded.   . Peripheral vascular disease   . Hyperlipidemia   . Vertigo   . Nephrolithiasis   . Chronic cough   . Asthma   . CPAP (continuous positive airway pressure) dependence     Past Surgical History  Procedure Date  . Knee arthoscopy   . Coronary artery bypass graft     2001 (LIMA to LAD, SVG to obtuse marginal, sequential  SVG to RV , marginal branch in the distal right coronary artery.  . Tonsillectomy   . Right carotid endarterectomy   . Left carpal tunnel     ROS:  Back, knee pain.. Otherwise as stated in the HPI and negative for all other  systems.  PHYSICAL EXAM BP 120/80  Pulse 56  Ht 5\' 7"  (1.702 m)  Wt 225 lb (102.059 kg)  BMI 35.24 kg/m2 GENERAL:  Well appearing NECK:  No jugular venous distention, waveform within normal limits, carotid upstroke brisk and symmetric, bilateral bruits, no thyromegaly, CEA scar LYMPHATICS:  No cervical, inguinal adenopathy LUNGS:  Clear to auscultation bilaterally BACK:  No CVA tenderness CHEST:  Well healed sternotomy scar. HEART:  PMI not displaced or sustained,S1 and S2 within normal limits, no S3, no S4, no clicks, no rubs, brief early peaking systolic murmur loudest at the right upper sternal border. ABD:  Flat, positive bowel sounds normal in frequency in pitch, no bruits, no rebound, no guarding, no midline pulsatile mass, no hepatomegaly, no splenomegaly EXT:  2 plus pulses throughout, no edema, no cyanosis no clubbing, SVG right scar.  Right leg larger than left SKIN:  No rashes no nodules NEURO:  Cranial nerves II through XII grossly intact, motor grossly intact throughout PSYCH:  Cognitively intact, oriented to person place and time  EKG:  Sinus bradycardia. Rate 56. No acute ST T wave changes.   09/25/2011   ASSESSMENT AND PLAN

## 2011-09-25 NOTE — Assessment & Plan Note (Signed)
The blood pressure is at target. No change in medications is indicated. We will continue with therapeutic lifestyle changes (TLC).  

## 2011-09-25 NOTE — Assessment & Plan Note (Signed)
She has no new symptoms. I will likely plan a stress perfusion study next year as it will have been 5 years since her last one.  Her grafts are now 77 years old.  She will let me know if she develops any symptoms between now and then. We will continue with risk reduction.

## 2011-09-25 NOTE — Patient Instructions (Signed)
The current medical regimen is effective;  continue present plan and medications.  Follow up in 1 year with Dr Antoine Poche.  You will receive a letter in the mail 2 months before you are due.  Please call us when you receive this letter to schedule your follow up appointment.  Please have fasting blood work at your [facility] doctor's office.

## 2011-09-25 NOTE — Assessment & Plan Note (Signed)
She will come back for lipid profile.

## 2011-09-25 NOTE — Assessment & Plan Note (Signed)
She has this followed by the vascular surgeons.

## 2011-10-08 ENCOUNTER — Encounter: Payer: Self-pay | Admitting: Neurosurgery

## 2011-10-09 ENCOUNTER — Ambulatory Visit (INDEPENDENT_AMBULATORY_CARE_PROVIDER_SITE_OTHER): Payer: Medicare Other | Admitting: Neurosurgery

## 2011-10-09 ENCOUNTER — Encounter: Payer: Self-pay | Admitting: Neurosurgery

## 2011-10-09 ENCOUNTER — Ambulatory Visit (INDEPENDENT_AMBULATORY_CARE_PROVIDER_SITE_OTHER): Payer: Medicare Other | Admitting: *Deleted

## 2011-10-09 VITALS — BP 133/83 | HR 63 | Resp 18 | Ht 68.0 in | Wt 227.3 lb

## 2011-10-09 DIAGNOSIS — I6529 Occlusion and stenosis of unspecified carotid artery: Secondary | ICD-10-CM

## 2011-10-09 DIAGNOSIS — Z48812 Encounter for surgical aftercare following surgery on the circulatory system: Secondary | ICD-10-CM

## 2011-10-09 NOTE — Progress Notes (Signed)
[facility] [place] HISTORY AND PHYSICAL   CC: Annual carotid duplex for surveillance of known stenosis Referring Physician: Edilia Bo  History of Present Illness: 78 year old female patient of Dr. Edilia Bo with a history of a right CEA in 2007 with Dr. Madilyn Fireman. Patient reports no signs or symptoms of CVA, TIA, amaurosis fugax or word finding difficulties. She denies any new medical diagnoses or recent surgeries.  Past Medical History  Diagnosis Date  . Coronary artery disease     s/p MI 2001--(catheterizations in 2001 with left main 70% stenosis, LAD 70% followed by 90% stenosis, circumflex 99% stenosis, right coronary artery occluded.   . Peripheral vascular disease   . Hyperlipidemia   . Vertigo   . Nephrolithiasis   . Chronic cough   . Asthma   . Sleep apnea     CPAP    ROS: [x]  Positive   [ ]  Denies    General: [ ]  Weight loss, [ ]  Fever, [ ]  chills Neurologic: [ ]  Dizziness, [ ]  Blackouts, [ ]  Seizure [ ]  Stroke, [ ]  "Mini stroke", [ ]  Slurred speech, [ ]  Temporary blindness; [ ]  weakness in arms or legs, [ ]  Hoarseness Cardiac: [ ]  Chest pain/pressure, [ ]  Shortness of breath at rest [ ]  Shortness of breath with exertion, [ ]  Atrial fibrillation or irregular heartbeat Vascular: [ ]  Pain in legs with walking, [x ] Pain in legs at rest, [ ]  Pain in legs at night,  [ ]  Non-healing ulcer, [ ]  Blood clot in vein/DVT,   Pulmonary: [ ]  Home oxygen, [ ]  Productive cough, [ ]  Coughing up blood, [x ] Asthma,  [ ]  Wheezing Musculoskeletal:  [ ]  Arthritis, [ ]  Low back pain, [ ]  Joint pain Hematologic: [ ]  Easy Bruising, [ ]  Anemia; [ ]  Hepatitis Gastrointestinal: [ ]  Blood in stool, [ ]  Gastroesophageal Reflux/heartburn, [ ]  Trouble swallowing Urinary: [ ]  chronic Kidney disease, [ ]  on HD - [ ]  MWF or [ ]  TTHS, [ ]  Burning with urination, [ ]  Difficulty urinating Skin: [ ]  Rashes, [ ]  Wounds Psychological: [ ]  Anxiety, [ ]  Depression   Social History History    Substance Use Topics  . Smoking status: Never Smoker   . Smokeless tobacco: Not on file  . Alcohol Use: No    Family History Family History  Problem Relation Age of Onset  . Ovarian cancer Mother   . Stroke Father   . COPD Father     Allergies  Allergen Reactions  . Ace Inhibitors   . Codeine   . Losartan Potassium   . Quinapril     REACTION: cough  . Sibutramine     REACTION: palpitations  . Sulfonamide Derivatives     Current Outpatient Prescriptions  Medication Sig Dispense Refill  . albuterol (PROVENTIL HFA) 108 (90 BASE) MCG/ACT inhaler Inhale 2 puffs into the lungs every 6 (six) hours as needed.        Marland Kitchen aspirin 81 MG tablet Take 81 mg by mouth daily.        Marland Kitchen atenolol (TENORMIN) 50 MG tablet Take 1 tablet (50 mg total) by mouth daily.  90 tablet  3  . beclomethasone (QVAR) 80 MCG/ACT inhaler Inhale 2 puffs into the lungs as needed.       . bisacodyl (DULCOLAX) 5 MG EC tablet Take 5 mg by mouth as needed.       . fexofenadine (ALLEGRA) 180 MG tablet Take 180 mg by  mouth as needed.        . folic acid (FOLVITE) 400 MCG tablet Take 400 mcg by mouth daily.        Jodelle Green Aspart-Potassium Aspart (ASPARTATE MG & K) 90-90 MG CAPS Take 1 capsule by mouth 3 (three) times a week.        . Magnesium 250 MG TABS Take 1 tablet by mouth 2 (two) times daily.        . meclizine (ANTIVERT) 25 MG tablet Take 25 mg by mouth as needed.       . Omega-3 Fatty Acids (FISH OIL) 1000 MG CAPS Take 1 capsule by mouth daily.        . pantoprazole (PROTONIX) 40 MG tablet Take 40 mg by mouth 2 (two) times daily.        . simvastatin (ZOCOR) 40 MG tablet Take 1 tablet (40 mg total) by mouth at bedtime.  90 tablet  3  . triamterene-hydrochlorothiazide (DYAZIDE) 37.5-25 MG per capsule Take 1 each (1 capsule total) by mouth daily.  90 capsule  3  . diclofenac (VOLTAREN) 75 MG EC tablet Take 75 mg by mouth 2 (two) times daily.          Physical Examination  Filed Vitals:   10/09/11 1119  BP:  133/83  Pulse: 63  Resp: 18    Body mass index is 34.56 kg/(m^2).  General:  WDWN in NAD Gait: Normal HEENT: WNL Eyes: Pupils equal Pulmonary: normal non-labored breathing , without Rales, rhonchi,  wheezing Cardiac: RRR, without  Murmurs, rubs or gallops; Abdomen: soft, NT, no masses Skin: no rashes, ulcers noted  Vascular Exam Pulses: 2+ radial pulses bilaterally Carotid bruits: Bilateral carotid pulses to auscultation no bruits are heard Extremities without ischemic changes, no Gangrene , no cellulitis; no open wounds;  Musculoskeletal: no muscle wasting or atrophy   Neurologic: A&O X 3; Appropriate Affect ; SENSATION: normal; MOTOR FUNCTION:  moving all extremities equally. Speech is fluent/normal  Non-Invasive Vascular Imaging CAROTID DUPLEX 10/09/2011  Right ICA 20 - 39 % stenosis Left ICA 20 - 39 % stenosis   ASSESSMENT/PLAN: Status post right CEA 2007, asymptomatic for signs of CVA, patient return in one year for repeat carotid duplex. Her questions were encouraged and answered, she is in agreement with this plan.  Lauree Chandler ANP   Clinic MD: Edilia Bo

## 2011-10-11 ENCOUNTER — Encounter: Payer: Self-pay | Admitting: Cardiology

## 2011-10-16 NOTE — Procedures (Unsigned)
CAROTID DUPLEX EXAM  INDICATION:  Followup right CEA.  HISTORY: Diabetes:  No Cardiac:  Yes Hypertension:  Yes Smoking:  No Previous Surgery:  Right CEA 05/22/2005 CV History: Amaurosis Fugax No, Paresthesias No, Hemiparesis No                                      RIGHT             LEFT Brachial systolic pressure:         146               140 Brachial Doppler waveforms:         WNL               WNL Vertebral direction of flow:        Antegrade         Antegrade DUPLEX VELOCITIES (cm/sec) CCA peak systolic                   81                72 ECA peak systolic                   96                55 ICA peak systolic                   94                64 ICA end diastolic                   23                17 PLAQUE MORPHOLOGY:                  Heterogeneous/soft                  Heterogeneous PLAQUE AMOUNT:                      Minimal           Mild PLAQUE LOCATION:                    CEA               ICA  IMPRESSION: 1. Patent right CEA with minimal soft plaquing observed. 2. 1 to 39% left ICA stenosis. 3. Bilateral vertebral arteries are within normal limits.  ___________________________________________ Di Kindle. Edilia Bo, M.D.  LT/MEDQ  D:  10/09/2011  T:  10/09/2011  Job:  [id number]

## 2012-09-30 ENCOUNTER — Encounter: Payer: Self-pay | Admitting: Cardiology

## 2012-09-30 ENCOUNTER — Ambulatory Visit (INDEPENDENT_AMBULATORY_CARE_PROVIDER_SITE_OTHER): Payer: Medicare Other | Admitting: Cardiology

## 2012-09-30 VITALS — BP 121/72 | HR 53 | Ht 68.0 in

## 2012-09-30 DIAGNOSIS — I2510 Atherosclerotic heart disease of native coronary artery without angina pectoris: Secondary | ICD-10-CM

## 2012-09-30 DIAGNOSIS — E785 Hyperlipidemia, unspecified: Secondary | ICD-10-CM

## 2012-09-30 DIAGNOSIS — G4733 Obstructive sleep apnea (adult) (pediatric): Secondary | ICD-10-CM

## 2012-09-30 DIAGNOSIS — I10 Essential (primary) hypertension: Secondary | ICD-10-CM

## 2012-09-30 MED ORDER — ATENOLOL 50 MG PO TABS: 50.0000 mg | ORAL_TABLET | Freq: Every day | ORAL | Status: DC

## 2012-09-30 MED ORDER — TRIAMTERENE-HCTZ 37.5-25 MG PO CAPS: 1.0000 | ORAL_CAPSULE | Freq: Every day | ORAL | Status: DC

## 2012-09-30 MED ORDER — SIMVASTATIN 40 MG PO TABS: 40.0000 mg | ORAL_TABLET | Freq: Every day | ORAL | Status: DC

## 2012-09-30 NOTE — Patient Instructions (Addendum)
The current medical regimen is effective;  continue present plan and medications.  Your physician has requested that you have a lexiscan myoview. For further information please visit [email or web address]. Please follow instruction sheet, as given.  Follow up will be based on these results 

## 2012-09-30 NOTE — Progress Notes (Signed)
HPI The patient presents for one year followup. Since I last saw her she has had no new cardiovascular complaints. She does a little gardening but is limited by knee and back pain. With her activities she denies any chest pressure, neck or arm discomfort. She has no palpitations, presyncope or syncope. She has had no PND or orthopnea. She has had no weight gain.  She does have some right greater than left lower extremity edema. However, this seems to be unchanged.  Allergies  Allergen Reactions  . Ace Inhibitors   . Codeine   . Losartan Potassium   . Quinapril     REACTION: cough  . Sibutramine     REACTION: palpitations  . Sulfonamide Derivatives     Current Outpatient Prescriptions  Medication Sig Dispense Refill  . aspirin 81 MG tablet Take 81 mg by mouth daily.        Marland Kitchen atenolol (TENORMIN) 50 MG tablet Take 1 tablet (50 mg total) by mouth daily.  90 tablet  3  . bisacodyl (DULCOLAX) 5 MG EC tablet Take 5 mg by mouth as needed.       . fexofenadine (ALLEGRA) 180 MG tablet Take 180 mg by mouth as needed.        . folic acid (FOLVITE) 400 MCG tablet Take 400 mcg by mouth daily.        Jodelle Green Aspart-Potassium Aspart (ASPARTATE MG & K) 90-90 MG CAPS Take 1 capsule by mouth 3 (three) times a week.        . Magnesium 250 MG TABS Take 1 tablet by mouth 2 (two) times daily.        . meclizine (ANTIVERT) 25 MG tablet Take 25 mg by mouth as needed.       . Omega-3 Fatty Acids (FISH OIL) 1000 MG CAPS Take 1 capsule by mouth daily.        . pantoprazole (PROTONIX) 40 MG tablet Take 40 mg by mouth 2 (two) times daily.        . simvastatin (ZOCOR) 40 MG tablet Take 1 tablet (40 mg total) by mouth at bedtime.  90 tablet  3  . triamterene-hydrochlorothiazide (DYAZIDE) 37.5-25 MG per capsule Take 1 each (1 capsule total) by mouth daily.  90 capsule  3   No current facility-administered medications for this visit.    Past Medical History  Diagnosis Date  . Coronary artery disease     s/p MI  2001--(catheterizations in 2001 with left main 70% stenosis, LAD 70% followed by 90% stenosis, circumflex 99% stenosis, right coronary artery occluded.   . Peripheral vascular disease   . Hyperlipidemia   . Vertigo   . Nephrolithiasis   . Chronic cough   . Asthma   . Sleep apnea     CPAP    Past Surgical History  Procedure Laterality Date  . Knee arthoscopy    . Coronary artery bypass graft      2001 (LIMA to LAD, SVG to obtuse marginal, sequential  SVG to RV , marginal branch in the distal right coronary artery.  . Tonsillectomy    . Right carotid endarterectomy    . Left carpal tunnel      ROS:  Back, knee pain, allergies. Otherwise as stated in the HPI and negative for all other systems.  PHYSICAL EXAM BP 121/72  Pulse 53  Ht 5\' 8"  (1.727 m) GENERAL:  Well appearing NECK:  No jugular venous distention, waveform within normal limits, carotid upstroke brisk  and symmetric, bilateral bruits, no thyromegaly, CEA scar LYMPHATICS:  No cervical, inguinal adenopathy LUNGS:  Clear to auscultation bilaterally BACK:  No CVA tenderness CHEST:  Well healed sternotomy scar. HEART:  PMI not displaced or sustained,S1 and S2 within normal limits, no S3, no S4, no clicks, no rubs, brief early peaking systolic murmur loudest at the right upper sternal border. ABD:  Flat, positive bowel sounds normal in frequency in pitch, no bruits, no rebound, no guarding, no midline pulsatile mass, no hepatomegaly, no splenomegaly EXT:  2 plus pulses throughout, no edema, no cyanosis no clubbing, SVG right scar.  Right leg larger than left SKIN:  No rashes no nodules NEURO:  Cranial nerves II through XII grossly intact, motor grossly intact throughout PSYCH:  Cognitively intact, oriented to person place and time  EKG:  Sinus bradycardia. Rate 53. No acute ST T wave changes.  09/30/2012   ASSESSMENT AND PLAN  CAD:  She needs screening stress testing with her 77 year old bypass grafts. However, she  wouldn't be a walk on a treadmill. Therefore, [employer].  HTN:  The blood pressure is at target. No change in medications is indicated. We will continue with therapeutic lifestyle changes (TLC).  CAROTID STENOSIS:  She is having this followed by vascular surgery.  DYSLIPIDEMIA:  She had excellent lipids last year. No change in therapy is indicated.

## 2012-10-08 ENCOUNTER — Other Ambulatory Visit: Payer: Self-pay | Admitting: *Deleted

## 2012-10-08 DIAGNOSIS — Z48812 Encounter for surgical aftercare following surgery on the circulatory system: Secondary | ICD-10-CM

## 2012-10-09 ENCOUNTER — Other Ambulatory Visit (INDEPENDENT_AMBULATORY_CARE_PROVIDER_SITE_OTHER): Payer: Medicare Other | Admitting: *Deleted

## 2012-10-09 ENCOUNTER — Ambulatory Visit: Payer: Medicare Other | Admitting: Neurosurgery

## 2012-10-09 DIAGNOSIS — I6529 Occlusion and stenosis of unspecified carotid artery: Secondary | ICD-10-CM

## 2012-10-09 DIAGNOSIS — Z48812 Encounter for surgical aftercare following surgery on the circulatory system: Secondary | ICD-10-CM

## 2012-10-13 ENCOUNTER — Ambulatory Visit (HOSPITAL_COMMUNITY): Payer: Medicare Other | Attending: Cardiology | Admitting: Radiology

## 2012-10-13 ENCOUNTER — Other Ambulatory Visit: Payer: Self-pay | Admitting: *Deleted

## 2012-10-13 VITALS — BP 139/56 | Ht 68.0 in | Wt 230.0 lb

## 2012-10-13 DIAGNOSIS — R5381 Other malaise: Secondary | ICD-10-CM | POA: Insufficient documentation

## 2012-10-13 DIAGNOSIS — Z48812 Encounter for surgical aftercare following surgery on the circulatory system: Secondary | ICD-10-CM

## 2012-10-13 DIAGNOSIS — Z951 Presence of aortocoronary bypass graft: Secondary | ICD-10-CM | POA: Insufficient documentation

## 2012-10-13 DIAGNOSIS — R0602 Shortness of breath: Secondary | ICD-10-CM | POA: Insufficient documentation

## 2012-10-13 DIAGNOSIS — I2510 Atherosclerotic heart disease of native coronary artery without angina pectoris: Secondary | ICD-10-CM

## 2012-10-13 DIAGNOSIS — R55 Syncope and collapse: Secondary | ICD-10-CM | POA: Insufficient documentation

## 2012-10-13 DIAGNOSIS — I252 Old myocardial infarction: Secondary | ICD-10-CM | POA: Insufficient documentation

## 2012-10-13 DIAGNOSIS — I779 Disorder of arteries and arterioles, unspecified: Secondary | ICD-10-CM | POA: Insufficient documentation

## 2012-10-13 DIAGNOSIS — I739 Peripheral vascular disease, unspecified: Secondary | ICD-10-CM | POA: Insufficient documentation

## 2012-10-13 DIAGNOSIS — I491 Atrial premature depolarization: Secondary | ICD-10-CM

## 2012-10-13 DIAGNOSIS — E785 Hyperlipidemia, unspecified: Secondary | ICD-10-CM | POA: Insufficient documentation

## 2012-10-13 DIAGNOSIS — J45909 Unspecified asthma, uncomplicated: Secondary | ICD-10-CM | POA: Insufficient documentation

## 2012-10-13 DIAGNOSIS — R002 Palpitations: Secondary | ICD-10-CM | POA: Insufficient documentation

## 2012-10-13 MED ORDER — TECHNETIUM TC 99M SESTAMIBI GENERIC - CARDIOLITE
10.0000 | Freq: Once | INTRAVENOUS | Status: AC | PRN
  Administered 2012-10-13: 10 via INTRAVENOUS

## 2012-10-13 MED ORDER — REGADENOSON 0.4 MG/5ML IV SOLN
0.4000 mg | Freq: Once | INTRAVENOUS | Status: AC
  Administered 2012-10-13: 0.4 mg via INTRAVENOUS

## 2012-10-13 MED ORDER — AMINOPHYLLINE 25 MG/ML IV SOLN
75.0000 mg | Freq: Once | INTRAVENOUS | Status: AC
  Administered 2012-10-13: 75 mg via INTRAVENOUS

## 2012-10-13 MED ORDER — TECHNETIUM TC 99M SESTAMIBI GENERIC - CARDIOLITE
30.0000 | Freq: Once | INTRAVENOUS | Status: AC | PRN
  Administered 2012-10-13: 30 via INTRAVENOUS

## 2012-10-13 NOTE — Progress Notes (Signed)
[facility] SITE 3 NUCLEAR MED [address] [place], [place] [address] [phone]    Cardiology Nuclear Med Study  Lori Price is a 79 y.o. female     MRN : [id number]     DOB: 1932/01/08  Procedure Date: 10/13/2012  Nuclear Med Background Indication for Stress Test:  Evaluation for Ischemia and Graft Patency History:  Asthma and '01 MI-IWMI-Heart Cath: Multi-vessel Dz EF: 65%-CABGx4, '01 ECHO: NL mild LVH, '09 ( no ischemia) inferior lateral scar vs. soft tissue attenuation EF: 63% Cardiac Risk Factors: Carotid Disease, Lipids and PVD  Symptoms:  Fatigue, Near Syncope, Palpitations and SOB   Nuclear Pre-Procedure Caffeine/Decaff Intake:  None > 12 hrs NPO After: 8:30pm   Lungs:  clear O2 Sat: 94% on room air. IV 0.9% NS with Angio Cath:  22g  IV Site: R Hand x 1, tolerated well IV Started by:  Irean Hong, RN  Chest Size (in):  44 Cup Size: C  Height: 5\' 8"  (1.727 m)  Weight:  230 lb (104.327 kg)  BMI:  Body mass index is 34.98 kg/(m^2). Tech Comments:  Took Atenolol this am. This patient was sob,nausea, and dizziness with the Lexiscan injection. The symptoms didn't go away so she was reversed with Aminophylline 75 mg IV. All symptoms were reversed.    Nuclear Med Study 1 or 2 day study: 1 day  Stress Test Type:  Eugenie Birks  Reading MD: Olga Millers, MD  Order Authorizing Provider:  Rollene Rotunda, MD  Resting Radionuclide: Technetium 36m Sestamibi  Resting Radionuclide Dose: 11.0 mCi   Stress Radionuclide:  Technetium 57m Sestamibi  Stress Radionuclide Dose: 33.0 mCi           Stress Protocol Rest HR: 48 Stress HR: 56  Rest BP: 139/56 Stress BP: 138/99  Exercise Time (min): n/a METS: n/a   Predicted Max HR: 142 bpm % Max HR: 41.55 bpm Rate Pressure Product: 8201   Dose of Adenosine (mg):  n/a Dose of Lexiscan: 0.4 mg  Dose of Atropine (mg): n/a Dose of Dobutamine: n/a mcg/kg/min (at max HR)  Stress Test Technologist: Milana Na, EMT-P  Nuclear  Technologist:  Domenic Polite, CNMT     Rest Procedure:  Myocardial perfusion imaging was performed at rest 45 minutes following the intravenous administration of Technetium 76m Sestamibi. Rest ECG: Sinus bradycardia with PACs; nonspecific ST changes.  Stress Procedure:  The patient received IV Lexiscan 0.4 mg over 15-seconds.  Technetium 28m Sestamibi injected at 30-seconds. This patient had sob, nausea, dizziness, and she dropped her BP. Quantitative spect images were obtained after a 45 minute delay. Stress ECG: No significant ST segment change suggestive of ischemia.  QPS Raw Data Images:  Acquisition technically good; normal left ventricular size. Stress Images:  There is decreased uptake in the anterior wall. Rest Images:  There is decreased uptake in the anterior wall, less prominent compared to the stress images. Subtraction (SDS):  These findings are consistent with mild anterior ischemia; cannot R/O shifting breast attenuation. Transient Ischemic Dilatation (Normal <1.22):  1.06 Lung/Heart Ratio (Normal <0.45):  0.37  Quantitative Gated Spect Images QGS EDV:  98 ml QGS ESV:  34 ml  Impression Exercise Capacity:  Lexiscan with no exercise. BP Response:  Hypotensive blood pressure response. Clinical Symptoms:  There is dyspnea. ECG Impression:  No significant ST segment change suggestive of ischemia. Comparison with Prior Nuclear Study: No images to compare  Overall Impression:  Low risk stress nuclear study with a small, mild, partially  reversible anterior defect consistent with mild anterior ischemia (cannot R/O shifting breast attenuation).  LV Ejection Fraction: 65%.  LV Wall Motion:  NL LV Function; NL Wall Motion  Olga Millers

## 2012-10-15 ENCOUNTER — Encounter: Payer: Self-pay | Admitting: Vascular Surgery

## 2012-11-30 ENCOUNTER — Telehealth: Payer: Self-pay | Admitting: Cardiology

## 2012-11-30 NOTE — Telephone Encounter (Signed)
New problem    Surgery on  7/21 . Status of cardiac clearance

## 2012-11-30 NOTE — Telephone Encounter (Signed)
Called and requested [facility] fax request for surgical clearance including type of surgery and when is it scheduled for.

## 2012-12-01 NOTE — Telephone Encounter (Signed)
Received surgical clearance request and put in dictating area for Dr Antoine Poche to review and sign

## 2013-01-12 NOTE — Telephone Encounter (Signed)
Has been completed faxed and scanned into EPIC

## 2013-06-29 ENCOUNTER — Other Ambulatory Visit: Payer: Self-pay | Admitting: Vascular Surgery

## 2013-06-29 DIAGNOSIS — Z48812 Encounter for surgical aftercare following surgery on the circulatory system: Secondary | ICD-10-CM

## 2013-06-29 DIAGNOSIS — I6529 Occlusion and stenosis of unspecified carotid artery: Secondary | ICD-10-CM

## 2013-08-18 DIAGNOSIS — M25569 Pain in unspecified knee: Secondary | ICD-10-CM | POA: Diagnosis not present

## 2013-08-22 ENCOUNTER — Emergency Department (HOSPITAL_COMMUNITY): Payer: Medicare Other

## 2013-08-22 ENCOUNTER — Emergency Department (HOSPITAL_COMMUNITY)
Admission: EM | Admit: 2013-08-22 | Discharge: 2013-08-22 | Disposition: A | Discharge status: Home / Self Care | Payer: Medicare Other | Attending: Emergency Medicine | Admitting: Emergency Medicine

## 2013-08-22 ENCOUNTER — Encounter (HOSPITAL_COMMUNITY): Payer: Self-pay | Admitting: Emergency Medicine

## 2013-08-22 DIAGNOSIS — E785 Hyperlipidemia, unspecified: Secondary | ICD-10-CM | POA: Insufficient documentation

## 2013-08-22 DIAGNOSIS — Z87442 Personal history of urinary calculi: Secondary | ICD-10-CM | POA: Insufficient documentation

## 2013-08-22 DIAGNOSIS — Z9981 Dependence on supplemental oxygen: Secondary | ICD-10-CM | POA: Diagnosis not present

## 2013-08-22 DIAGNOSIS — J45909 Unspecified asthma, uncomplicated: Secondary | ICD-10-CM | POA: Diagnosis not present

## 2013-08-22 DIAGNOSIS — Z951 Presence of aortocoronary bypass graft: Secondary | ICD-10-CM | POA: Diagnosis not present

## 2013-08-22 DIAGNOSIS — Z7982 Long term (current) use of aspirin: Secondary | ICD-10-CM | POA: Diagnosis not present

## 2013-08-22 DIAGNOSIS — Z79899 Other long term (current) drug therapy: Secondary | ICD-10-CM | POA: Diagnosis not present

## 2013-08-22 DIAGNOSIS — I2510 Atherosclerotic heart disease of native coronary artery without angina pectoris: Secondary | ICD-10-CM | POA: Diagnosis not present

## 2013-08-22 DIAGNOSIS — S42213A Unspecified displaced fracture of surgical neck of unspecified humerus, initial encounter for closed fracture: Secondary | ICD-10-CM | POA: Diagnosis not present

## 2013-08-22 DIAGNOSIS — G4730 Sleep apnea, unspecified: Secondary | ICD-10-CM | POA: Diagnosis not present

## 2013-08-22 DIAGNOSIS — R209 Unspecified disturbances of skin sensation: Secondary | ICD-10-CM | POA: Insufficient documentation

## 2013-08-22 DIAGNOSIS — R296 Repeated falls: Secondary | ICD-10-CM | POA: Insufficient documentation

## 2013-08-22 DIAGNOSIS — S42209A Unspecified fracture of upper end of unspecified humerus, initial encounter for closed fracture: Secondary | ICD-10-CM | POA: Diagnosis not present

## 2013-08-22 DIAGNOSIS — T148XXA Other injury of unspecified body region, initial encounter: Secondary | ICD-10-CM | POA: Diagnosis not present

## 2013-08-22 DIAGNOSIS — M79609 Pain in unspecified limb: Secondary | ICD-10-CM | POA: Diagnosis not present

## 2013-08-22 DIAGNOSIS — Y929 Unspecified place or not applicable: Secondary | ICD-10-CM | POA: Insufficient documentation

## 2013-08-22 DIAGNOSIS — Y9389 Activity, other specified: Secondary | ICD-10-CM | POA: Insufficient documentation

## 2013-08-22 MED ORDER — OXYCODONE-ACETAMINOPHEN 5-325 MG PO TABS: 1.0000 | ORAL_TABLET | Freq: Four times a day (QID) | ORAL | Status: DC | PRN

## 2013-08-22 MED ORDER — FENTANYL CITRATE 0.05 MG/ML IJ SOLN
50.0000 ug | Freq: Once | INTRAMUSCULAR | Status: AC
  Administered 2013-08-22: 50 ug via INTRAVENOUS
  Filled 2013-08-22: qty 2

## 2013-08-22 MED ORDER — ONDANSETRON HCL 4 MG/2ML IJ SOLN
4.0000 mg | Freq: Once | INTRAMUSCULAR | Status: AC
Start: 2013-08-22 — End: 2013-08-22
  Administered 2013-08-22: 4 mg via INTRAVENOUS
  Filled 2013-08-22: qty 2

## 2013-08-22 MED ORDER — ONDANSETRON 4 MG PO TBDP
4.0000 mg | ORAL_TABLET | Freq: Once | ORAL | Status: AC
  Administered 2013-08-22: 4 mg via ORAL

## 2013-08-22 MED ORDER — FENTANYL CITRATE 0.05 MG/ML IJ SOLN
100.0000 ug | Freq: Once | INTRAMUSCULAR | Status: AC
  Administered 2013-08-22: 100 ug via INTRAVENOUS
  Filled 2013-08-22: qty 2

## 2013-08-22 MED ORDER — ONDANSETRON 4 MG PO TBDP
ORAL_TABLET | ORAL | Status: AC
  Filled 2013-08-22: qty 1

## 2013-08-22 NOTE — ED Notes (Signed)
Patient with no complaints at this time. Respirations even and unlabored. Skin warm/dry. Discharge instructions reviewed with patient at this time. Patient given opportunity to voice concerns/ask questions. IV removed per policy and band-aid applied to site. Patient discharged at this time and left Emergency Department via wheelchair.  

## 2013-08-22 NOTE — Discharge Instructions (Signed)
Humerus Fracture, Treated with Immobilization °The humerus is the large bone in your upper arm. You have a broken (fractured) humerus. These fractures are easily diagnosed with X-rays. °TREATMENT  °Simple fractures which will heal without disability are treated with simple immobilization. Immobilization means you will wear a cast, splint, or sling. You have a fracture which will do well with immobilization. The fracture will heal well simply by being held in a good position until it is stable enough to begin range of motion exercises. Do not take part in activities which would further injure your arm.  °HOME CARE INSTRUCTIONS  °· Put ice on the injured area. °· Put ice in a plastic bag. °· Place a towel between your skin and the bag. °· Leave the ice on for 15-20 minutes, 03-04 times a day. °· If you have a cast: °· Do not scratch the skin under the cast using sharp or pointed objects. °· Check the skin around the cast every day. You may put lotion on any red or sore areas. °· Keep your cast dry and clean. °· If you have a splint: °· Wear the splint as directed. °· Keep your splint dry and clean. °· You may loosen the elastic around the splint if your fingers become numb, tingle, or turn cold or blue. °· If you have a sling: °· Wear the sling as directed. °· Do not put pressure on any part of your cast or splint until it is fully hardened. °· Your cast or splint can be protected during bathing with a plastic bag. Do not lower the cast or splint into water. °· Only take over-the-counter or prescription medicines for pain, discomfort, or fever as directed by your caregiver. °· Do range of motion exercises as instructed by your caregiver. °· Follow up as directed by your caregiver. This is very important in order to avoid permanent injury or disability and chronic pain. °SEEK IMMEDIATE MEDICAL CARE IF:  °· Your skin or nails in the injured arm turn blue or gray. °· Your arm feels cold or numb. °· You develop severe  pain in the injured arm. °· You are having problems with the medicines you were given. °MAKE SURE YOU:  °· Understand these instructions. °· Will watch your condition. °· Will get help right away if you are not doing well or get worse. °Document Released: 08/12/2000 Document Revised: 07/29/2011 Document Reviewed: 06/20/2010 °ExitCare® Patient Information ©2014 [employer]. ° °

## 2013-08-22 NOTE — ED Provider Notes (Signed)
CSN: [id number]     Arrival date & time 08/22/13  2979 History   First MD Initiated Contact with Patient 08/22/13 0705    This chart was scribed for No att. providers found by Terressa Koyanagi, ED Scribe. This patient was seen in room APA05/APA05 and the patient's care was started at 10:47 AM.  GXQ:JJHE,RDEYC B., MD  Chief Complaint  Patient presents with  . Shoulder Injury    The history is provided by the patient. No language interpreter was used.   HPI Comments: Lori Price is a 80 y.o. female, with a history of CAD, HLD, and peripheral vascular disease, who presents to the Emergency Department complaining of a fall onset this morning. Pt reports that she lost her balance and fell on her right shoulder. Pt states that she feels minor numbness in her right shoulder. Pt also complains of associated pain in her right elbow. Pt denies head or neck injury. Pt denies on being on any blood thinners aside from baby ASA.   Past Medical History  Diagnosis Date  . Coronary artery disease     s/p MI 2001--(catheterizations in 2001 with left main 70% stenosis, LAD 70% followed by 90% stenosis, circumflex 99% stenosis, right coronary artery occluded.   . Peripheral vascular disease   . Hyperlipidemia   . Vertigo   . Nephrolithiasis   . Chronic cough   . Asthma   . Sleep apnea     CPAP   Past Surgical History  Procedure Laterality Date  . Knee arthoscopy    . Coronary artery bypass graft      2001 (LIMA to LAD, SVG to obtuse marginal, sequential  SVG to RV , marginal branch in the distal right coronary artery.  . Tonsillectomy    . Right carotid endarterectomy    . Left carpal tunnel    . Carpal tunnel release Right    Family History  Problem Relation Age of Onset  . Ovarian cancer Mother   . Stroke Father   . COPD Father    History  Substance Use Topics  . Smoking status: Never Smoker   . Smokeless tobacco: Not on file  . Alcohol Use: No   OB History   Grav Para Term Preterm  Abortions TAB SAB Ect Mult Living                 Review of Systems  Musculoskeletal: Positive for myalgias (right shoulder and right elbow). Negative for neck pain and neck stiffness.  Neurological: Positive for numbness (Right shoulder ).  Hematological: Does not bruise/bleed easily.   Allergies  Ace inhibitors; Codeine; Losartan potassium; Quinapril; Sibutramine; and Sulfonamide derivatives  Home Medications   Current Outpatient Rx  Name  Route  Sig  Dispense  Refill  . aspirin 81 MG tablet   Oral   Take 81 mg by mouth daily.           Marland Kitchen atenolol (TENORMIN) 50 MG tablet   Oral   Take 1 tablet (50 mg total) by mouth daily.   90 tablet   3   . bisacodyl (DULCOLAX) 5 MG EC tablet   Oral   Take 5 mg by mouth as needed.          . fexofenadine (ALLEGRA) 180 MG tablet   Oral   Take 180 mg by mouth as needed.           . folic acid (FOLVITE) 144 MCG tablet   Oral  Take 400 mcg by mouth daily.           [name] Aspart-Potassium Aspart (ASPARTATE MG & K) 90-90 MG CAPS   Oral   Take 1 capsule by mouth 3 (three) times a week.           . Magnesium 250 MG TABS   Oral   Take 1 tablet by mouth 2 (two) times daily.           . meclizine (ANTIVERT) 25 MG tablet   Oral   Take 25 mg by mouth as needed.          . Omega-3 Fatty Acids (FISH OIL) 1000 MG CAPS   Oral   Take 1 capsule by mouth daily.           . pantoprazole (PROTONIX) 40 MG tablet   Oral   Take 40 mg by mouth 2 (two) times daily.           . simvastatin (ZOCOR) 40 MG tablet   Oral   Take 1 tablet (40 mg total) by mouth at bedtime.   90 tablet   3   . oxyCODONE-acetaminophen (PERCOCET/ROXICET) 5-325 MG per tablet   Oral   Take 1-2 tablets by mouth every 6 (six) hours as needed for severe pain.   15 tablet   0   . triamterene-hydrochlorothiazide (DYAZIDE) 37.5-25 MG per capsule   Oral   Take 1 each (1 capsule total) by mouth daily.   90 capsule   3    Triage Vitals: BP 169/74   Pulse 52  Resp 16  SpO2 93% Physical Exam  Nursing note and vitals reviewed. Constitutional: She is oriented to person, place, and time. She appears well-developed and well-nourished. No distress.  HENT:  Head: Normocephalic and atraumatic.  Neck: Neck supple. No tracheal deviation present.  Cardiovascular: Normal rate.   Pulmonary/Chest: Effort normal. No respiratory distress.  Abdominal: There is no tenderness.  Musculoskeletal:  Limited ROM of right shoulder due to pain Pain over the proximal humerus  Decreased sensation over deltoid area No cervical tenderness Sensation is intact over the radial medial and ulnar distribution Strong radial pulse.   Neurological: She is alert and oriented to person, place, and time.  Skin: Skin is warm and dry.  Psychiatric: She has a normal mood and affect. Her behavior is normal.    ED Course  Procedures (including critical care time) DIAGNOSTIC STUDIES: Oxygen Saturation is 93% on RA, adequate by my interpretation.    COORDINATION OF CARE:  7:09 AM-Discussed treatment plan which includes imaging and meds with pt at bedside and pt agreed to plan.   Labs Review Labs Reviewed - No data to display Imaging Review Dg Shoulder Right  08/22/2013   CLINICAL DATA:  SHOULDER INJURY SHOULDER INJURY  EXAM: RIGHT SHOULDER - 2+ VIEW  COMPARISON:  No comparisons  FINDINGS: A comminuted impacted humeral humeral neck fracture appreciated. There is medial displacement and lateral angulation. Degenerative changes are appreciated within the shoulder. Right lung apex unremarkable.  IMPRESSION: Impacted humeral neck fracture.   Electronically Signed   By: Margaree Mackintosh M.D.   On: 08/22/2013 08:26     EKG Interpretation None      MDM   Final diagnoses:  Proximal humerus fracture    Patient with fall with proximal right humerus fracture. Strength is intact over hand. Sensation intact in hand. She does have some paresthesias over axillary distribution.  Patient was given a sling and will  followup with an orthopedic surgeon.  I personally performed the services described in this documentation, which was scribed in my presence. The recorded information has been reviewed and is accurate.     Jasper Riling. Alvino Chapel, MD 08/22/13 1048

## 2013-08-22 NOTE — ED Notes (Signed)
Pt fell landing on right shoulder. EMS gave 4 morphine IV

## 2013-08-23 DIAGNOSIS — M25619 Stiffness of unspecified shoulder, not elsewhere classified: Secondary | ICD-10-CM | POA: Diagnosis not present

## 2013-08-23 DIAGNOSIS — S42213A Unspecified displaced fracture of surgical neck of unspecified humerus, initial encounter for closed fracture: Secondary | ICD-10-CM | POA: Diagnosis not present

## 2013-08-23 DIAGNOSIS — M25519 Pain in unspecified shoulder: Secondary | ICD-10-CM | POA: Diagnosis not present

## 2013-08-24 ENCOUNTER — Other Ambulatory Visit: Payer: Self-pay | Admitting: Sports Medicine

## 2013-08-24 ENCOUNTER — Encounter (HOSPITAL_COMMUNITY): Payer: Self-pay | Admitting: Pharmacy Technician

## 2013-08-24 DIAGNOSIS — S42213A Unspecified displaced fracture of surgical neck of unspecified humerus, initial encounter for closed fracture: Secondary | ICD-10-CM

## 2013-08-25 ENCOUNTER — Telehealth: Payer: Self-pay | Admitting: Cardiology

## 2013-08-25 NOTE — Telephone Encounter (Signed)
Left message for pt that Dr Percival Spanish is not in the office this week and that I have spoken with Claiborne Billings at [facility].  She stated they would handle it.  Advised pt to call their office back for further instructions.

## 2013-08-25 NOTE — H&P (Signed)
Lori Price is an 80 y.o. female.    Chief Complaint: right shoulder pain  HPI: Pt is a 80 y.o. female complaining of right shoulder pain for several days s/p fall. Pain had continually increased since the beginning. X-rays in the clinic show proximal humerus fracture. Various options are discussed with the patient. Risks, benefits and expectations were discussed with the patient. Patient understand the risks, benefits and expectations and wishes to proceed with surgery.   PCP:  Glenda Chroman., MD  D/C Plans:  Home with HHPT  PMH: Past Medical History  Diagnosis Date  . Coronary artery disease     s/p MI 2001--(catheterizations in 2001 with left main 70% stenosis, LAD 70% followed by 90% stenosis, circumflex 99% stenosis, right coronary artery occluded.   . Peripheral vascular disease   . Hyperlipidemia   . Vertigo   . Nephrolithiasis   . Chronic cough   . Asthma   . Sleep apnea     CPAP    PSH: Past Surgical History  Procedure Laterality Date  . Knee arthoscopy    . Coronary artery bypass graft      2001 (LIMA to LAD, SVG to obtuse marginal, sequential  SVG to RV , marginal branch in the distal right coronary artery.  . Tonsillectomy    . Right carotid endarterectomy    . Left carpal tunnel    . Carpal tunnel release Right     Social History:  reports that she has never smoked. She does not have any smokeless tobacco history on file. She reports that she does not drink alcohol or use illicit drugs.  Allergies:  Allergies  Allergen Reactions  . Oxycodone Hives and Itching  . Sulfonamide Derivatives Rash  . Ace Inhibitors   . Codeine Hives and Itching  . Losartan Potassium   . Quinapril     REACTION: cough  . Sibutramine     REACTION: palpitations    Medications: No current facility-administered medications for this encounter.   Current Outpatient Prescriptions  Medication Sig Dispense Refill  . aspirin 81 MG tablet Take 81 mg by mouth daily.        Marland Kitchen  atenolol (TENORMIN) 50 MG tablet Take 1 tablet (50 mg total) by mouth daily.  90 tablet  3  . beclomethasone (QVAR) 40 MCG/ACT inhaler Inhale 1 puff into the lungs 2 (two) times daily as needed (for breathing difficulty during allergery season).       . bisacodyl (DULCOLAX) 5 MG EC tablet Take 5 mg by mouth daily as needed for moderate constipation.       . fexofenadine (ALLEGRA) 180 MG tablet Take 180 mg by mouth daily as needed for allergies.       . folic acid (FOLVITE) 607 MCG tablet Take 400 mcg by mouth daily.        Marland Kitchen HYDROcodone-acetaminophen (NORCO/VICODIN) 5-325 MG per tablet Take 1-2 tablets by mouth every 6 (six) hours as needed for moderate pain.      [name] Aspart-Potassium Aspart (ASPARTATE MG & K) 90-90 MG CAPS Take 1 capsule by mouth 3 (three) times a week. On Mon, Wed, Fri      . Magnesium 250 MG TABS Take 250 mg by mouth 2 (two) times daily.       . meclizine (ANTIVERT) 25 MG tablet Take 25 mg by mouth daily as needed for dizziness.       . Omega-3 Fatty Acids (FISH OIL) 1000 MG CAPS Take 1,000 mg by  mouth daily.       . pantoprazole (PROTONIX) 40 MG tablet Take 40 mg by mouth daily.       . simvastatin (ZOCOR) 40 MG tablet Take 40 mg by mouth 2 (two) times a week. On Sun and Wed      . triamterene-hydrochlorothiazide (DYAZIDE) 37.5-25 MG per capsule Take 1 capsule by mouth daily as needed (for fluid retension and swelling).        No results found for this or any previous visit (from the past 48 hour(s)). No results found.  ROS: Pain with rom of the right upper extremity  Physical Exam:  Alert and oriented 80 y.o. female in no acute distress Cranial nerves 2-12 intact Cervical spine: full rom with no tenderness, nv intact distally Chest: active breath sounds bilaterally, no wheeze rhonchi or rales Heart: regular rate and rhythm, no murmur Abd: non tender non distended with active bowel sounds Hip is stable with rom  No use of left upper extremity nv intact  distally Minimal edema to arm and hand  Assessment/Plan Assessment: right proximal humerus fracture  Plan: Patient will undergo a right proximal humerus ORIF vs hemi arthroplasty by Dr. Veverly Fells at [name]. Risks benefits and expectations were discussed with the patient. Patient understand risks, benefits and expectations and wishes to proceed.

## 2013-08-25 NOTE — Telephone Encounter (Signed)
New Message:  Pt states she is scheduled to have orthopedic surgery on Monday.. Pt wants to know if she can stop her aspirin. Pt wants a call back

## 2013-08-27 ENCOUNTER — Ambulatory Visit
Admission: RE | Admit: 2013-08-27 | Discharge: 2013-08-27 | Disposition: A | Discharge status: Home / Self Care | Payer: Medicare Other | Source: Ambulatory Visit | Attending: Sports Medicine | Admitting: Sports Medicine

## 2013-08-27 ENCOUNTER — Ambulatory Visit (HOSPITAL_COMMUNITY)
Admission: RE | Admit: 2013-08-27 | Discharge: 2013-08-27 | Disposition: A | Discharge status: Home / Self Care | Payer: Medicare Other | Source: Ambulatory Visit | Attending: Orthopedic Surgery | Admitting: Orthopedic Surgery

## 2013-08-27 ENCOUNTER — Encounter (HOSPITAL_COMMUNITY): Payer: Self-pay

## 2013-08-27 ENCOUNTER — Encounter (HOSPITAL_COMMUNITY)
Admission: RE | Admit: 2013-08-27 | Discharge: 2013-08-27 | Disposition: A | Discharge status: Home / Self Care | Payer: Medicare Other | Source: Ambulatory Visit | Attending: Orthopedic Surgery | Admitting: Orthopedic Surgery

## 2013-08-27 DIAGNOSIS — S42213A Unspecified displaced fracture of surgical neck of unspecified humerus, initial encounter for closed fracture: Secondary | ICD-10-CM

## 2013-08-27 DIAGNOSIS — J42 Unspecified chronic bronchitis: Secondary | ICD-10-CM | POA: Diagnosis not present

## 2013-08-27 DIAGNOSIS — Z01818 Encounter for other preprocedural examination: Secondary | ICD-10-CM | POA: Insufficient documentation

## 2013-08-27 DIAGNOSIS — Z01812 Encounter for preprocedural laboratory examination: Secondary | ICD-10-CM | POA: Insufficient documentation

## 2013-08-27 DIAGNOSIS — R937 Abnormal findings on diagnostic imaging of other parts of musculoskeletal system: Secondary | ICD-10-CM | POA: Diagnosis not present

## 2013-08-27 DIAGNOSIS — S42293A Other displaced fracture of upper end of unspecified humerus, initial encounter for closed fracture: Secondary | ICD-10-CM | POA: Diagnosis not present

## 2013-08-27 HISTORY — DX: Essential (primary) hypertension: I10

## 2013-08-27 LAB — CBC
HCT: 44.2 % (ref 36.0–46.0)
Hemoglobin: 14.9 g/dL (ref 12.0–15.0)
MCH: 31.2 pg (ref 26.0–34.0)
MCHC: 33.7 g/dL (ref 30.0–36.0)
MCV: 92.7 fL (ref 78.0–100.0)
PLATELETS: 223 10*3/uL (ref 150–400)
RBC: 4.77 MIL/uL (ref 3.87–5.11)
RDW: 13.7 % (ref 11.5–15.5)
WBC: 11.3 10*3/uL — AB (ref 4.0–10.5)

## 2013-08-27 LAB — BASIC METABOLIC PANEL
BUN: 17 mg/dL (ref 6–23)
CALCIUM: 9.7 mg/dL (ref 8.4–10.5)
CO2: 25 mEq/L (ref 19–32)
CREATININE: 0.67 mg/dL (ref 0.50–1.10)
Chloride: 98 mEq/L (ref 96–112)
GFR, EST NON AFRICAN AMERICAN: 81 mL/min — AB (ref >90)
Glucose, Bld: 98 mg/dL (ref 70–99)
Potassium: 4.1 mEq/L (ref 3.7–5.3)
Sodium: 139 mEq/L (ref 137–147)

## 2013-08-27 NOTE — Progress Notes (Signed)
Anesthesia chart review:  Patient is a 80 year old female scheduled for right shoulder proximal humerus ORIF versus hemiarthroplasty on 08/30/13 by Dr. Veverly Fells. PAT was Friday 08/27/13. Chart was given to me for review after she had left her appointment.  History includes non-smoker, CAD/MI s/p CABG (LIMA to LAD, SVG to OM, sequential SVG to RV marginal and distal RCA) '01, HLD, HTN, nephrolithiasis, OSA no CPAP, asthma, vertigo, PAD with history of right CEA '07, hypotension with anesthesia for dental procedure. PCP is Dr. Jerene Bears. Cardiologist is Dr. Percival Spanish, last visit 09/30/12.  She was not having any CV symptoms, but he did recommend a routine stress test since her CABG was 13 years ago.  Overall, it was felt to be low risk. (Dr. Percival Spanish is out of the office this week.)  No new CV symptoms were documented from her PAT visit today.  Nuclear stress test on 10/13/12 showed: Overall Impression: Low risk stress nuclear study with a small, mild, partially reversible anterior defect consistent with mild anterior ischemia (cannot R/O shifting breast attenuation). LV Ejection Fraction: 65%. LV Wall Motion: NL LV Function; NL Wall Motion.  (Reviewed by Dr. Percival Spanish who felt there were no high grade findings, so no change in therapy was recommended.)  EKG on 09/30/12 showed SB, T wave abnormality in high lateral leads, ST abnormality in inferior leads III, aVF.  Findings appear stable when compared prior EKGs dating back to at least 09/2010.   Carotid duplex on 7/86/76 showed: < 72% LICA stenosis, widely patent right CEA site. Antegrade vertebral flow.  CXR on 08/27/13 showed: Enlargement of cardiac silhouette post CABG. Chronic bronchitic changes with lingular scarring.   Preoperative labs noted.   I reviewed above with anesthesiologist Dr. Linna Caprice. Dr. Percival Spanish did not recommend further testing following her stress test last year. Further evaluation by her assigned anesthesiologist on the day of surgery.  If  she remains asymptomatic from a CV standpoint then it is anticipated she can proceed.    George [facility]/Anesthesiology Phone [phone] 08/27/2013 4:47 PM

## 2013-08-29 MED ORDER — CEFAZOLIN SODIUM-DEXTROSE 2-3 GM-% IV SOLR
2.0000 g | INTRAVENOUS | Status: AC
  Administered 2013-08-30: 2 g via INTRAVENOUS
  Filled 2013-08-29: qty 50

## 2013-08-30 ENCOUNTER — Ambulatory Visit (HOSPITAL_COMMUNITY): Payer: Medicare Other | Admitting: Anesthesiology

## 2013-08-30 ENCOUNTER — Inpatient Hospital Stay (HOSPITAL_COMMUNITY): Payer: Medicare Other

## 2013-08-30 ENCOUNTER — Encounter (HOSPITAL_COMMUNITY)
Admission: RE | Disposition: A | Discharge status: Home Health | Payer: Self-pay | Source: Ambulatory Visit | Attending: Orthopedic Surgery

## 2013-08-30 ENCOUNTER — Encounter (HOSPITAL_COMMUNITY): Payer: Self-pay | Admitting: Surgery

## 2013-08-30 ENCOUNTER — Encounter (HOSPITAL_COMMUNITY): Payer: Medicare Other | Admitting: Vascular Surgery

## 2013-08-30 ENCOUNTER — Inpatient Hospital Stay (HOSPITAL_COMMUNITY)
Admission: RE | Admit: 2013-08-30 | Discharge: 2013-09-01 | DRG: 483 | Disposition: A | Discharge status: Home Health | Payer: Medicare Other | Source: Ambulatory Visit | Attending: Orthopedic Surgery | Admitting: Orthopedic Surgery

## 2013-08-30 DIAGNOSIS — S42201A Unspecified fracture of upper end of right humerus, initial encounter for closed fracture: Secondary | ICD-10-CM | POA: Diagnosis present

## 2013-08-30 DIAGNOSIS — I2510 Atherosclerotic heart disease of native coronary artery without angina pectoris: Secondary | ICD-10-CM | POA: Diagnosis present

## 2013-08-30 DIAGNOSIS — S42209A Unspecified fracture of upper end of unspecified humerus, initial encounter for closed fracture: Secondary | ICD-10-CM | POA: Diagnosis not present

## 2013-08-30 DIAGNOSIS — S42309A Unspecified fracture of shaft of humerus, unspecified arm, initial encounter for closed fracture: Secondary | ICD-10-CM | POA: Diagnosis not present

## 2013-08-30 DIAGNOSIS — G4730 Sleep apnea, unspecified: Secondary | ICD-10-CM | POA: Diagnosis present

## 2013-08-30 DIAGNOSIS — I739 Peripheral vascular disease, unspecified: Secondary | ICD-10-CM | POA: Diagnosis present

## 2013-08-30 DIAGNOSIS — I10 Essential (primary) hypertension: Secondary | ICD-10-CM | POA: Diagnosis present

## 2013-08-30 DIAGNOSIS — G8918 Other acute postprocedural pain: Secondary | ICD-10-CM | POA: Diagnosis not present

## 2013-08-30 DIAGNOSIS — IMO0002 Reserved for concepts with insufficient information to code with codable children: Secondary | ICD-10-CM | POA: Diagnosis not present

## 2013-08-30 DIAGNOSIS — J45909 Unspecified asthma, uncomplicated: Secondary | ICD-10-CM | POA: Diagnosis present

## 2013-08-30 DIAGNOSIS — I252 Old myocardial infarction: Secondary | ICD-10-CM

## 2013-08-30 DIAGNOSIS — Z7982 Long term (current) use of aspirin: Secondary | ICD-10-CM | POA: Diagnosis not present

## 2013-08-30 DIAGNOSIS — S43006A Unspecified dislocation of unspecified shoulder joint, initial encounter: Secondary | ICD-10-CM | POA: Diagnosis present

## 2013-08-30 DIAGNOSIS — Z79899 Other long term (current) drug therapy: Secondary | ICD-10-CM

## 2013-08-30 DIAGNOSIS — M25519 Pain in unspecified shoulder: Secondary | ICD-10-CM | POA: Diagnosis not present

## 2013-08-30 DIAGNOSIS — E785 Hyperlipidemia, unspecified: Secondary | ICD-10-CM | POA: Diagnosis present

## 2013-08-30 DIAGNOSIS — W19XXXA Unspecified fall, initial encounter: Secondary | ICD-10-CM | POA: Diagnosis present

## 2013-08-30 DIAGNOSIS — Z951 Presence of aortocoronary bypass graft: Secondary | ICD-10-CM | POA: Diagnosis not present

## 2013-08-30 DIAGNOSIS — S42293A Other displaced fracture of upper end of unspecified humerus, initial encounter for closed fracture: Secondary | ICD-10-CM | POA: Diagnosis not present

## 2013-08-30 HISTORY — PX: ORIF SHOULDER FRACTURE: SHX5035

## 2013-08-30 SURGERY — OPEN REDUCTION INTERNAL FIXATION (ORIF) SHOULDER FRACTURE
Anesthesia: General | Site: Shoulder | Laterality: Right

## 2013-08-30 MED ORDER — ACETAMINOPHEN 325 MG PO TABS
650.0000 mg | ORAL_TABLET | Freq: Four times a day (QID) | ORAL | Status: DC | PRN
  Administered 2013-08-31: 650 mg via ORAL
  Filled 2013-08-30: qty 2

## 2013-08-30 MED ORDER — DEXAMETHASONE SODIUM PHOSPHATE 4 MG/ML IJ SOLN
INTRAMUSCULAR | Status: DC | PRN
  Administered 2013-08-30: 8 mg via INTRAVENOUS

## 2013-08-30 MED ORDER — LIDOCAINE HCL (CARDIAC) 20 MG/ML IV SOLN
INTRAVENOUS | Status: DC | PRN
  Administered 2013-08-30: 50 mg via INTRAVENOUS

## 2013-08-30 MED ORDER — OMEGA-3-ACID ETHYL ESTERS 1 G PO CAPS
2.0000 g | ORAL_CAPSULE | Freq: Every day | ORAL | Status: DC
  Administered 2013-08-30 – 2013-08-31 (×2): 2 g via ORAL
  Filled 2013-08-30 (×4): qty 2

## 2013-08-30 MED ORDER — ACETAMINOPHEN 650 MG RE SUPP: 650.0000 mg | Freq: Four times a day (QID) | RECTAL | Status: DC | PRN

## 2013-08-30 MED ORDER — FLUTICASONE PROPIONATE HFA 44 MCG/ACT IN AERO
2.0000 | INHALATION_SPRAY | Freq: Two times a day (BID) | RESPIRATORY_TRACT | Status: DC
  Administered 2013-08-30 – 2013-09-01 (×4): 2 via RESPIRATORY_TRACT
  Filled 2013-08-30: qty 10.6

## 2013-08-30 MED ORDER — BUPIVACAINE-EPINEPHRINE PF 0.25-1:200000 % IJ SOLN
INTRAMUSCULAR | Status: AC
  Filled 2013-08-30: qty 30

## 2013-08-30 MED ORDER — MAGNESIUM OXIDE 400 (241.3 MG) MG PO TABS
200.0000 mg | ORAL_TABLET | Freq: Two times a day (BID) | ORAL | Status: DC
  Administered 2013-08-31 – 2013-09-01 (×3): 200 mg via ORAL
  Filled 2013-08-30 (×4): qty 0.5

## 2013-08-30 MED ORDER — MIDAZOLAM HCL 5 MG/5ML IJ SOLN
INTRAMUSCULAR | Status: DC | PRN
  Administered 2013-08-30: 2 mg via INTRAVENOUS

## 2013-08-30 MED ORDER — PHENYLEPHRINE HCL 10 MG/ML IJ SOLN
10.0000 mg | INTRAVENOUS | Status: DC | PRN
  Administered 2013-08-30: 25 ug/min via INTRAVENOUS

## 2013-08-30 MED ORDER — FENTANYL CITRATE 0.05 MG/ML IJ SOLN
INTRAMUSCULAR | Status: AC
  Filled 2013-08-30: qty 5

## 2013-08-30 MED ORDER — BUPIVACAINE-EPINEPHRINE (PF) 0.5% -1:200000 IJ SOLN
INTRAMUSCULAR | Status: AC
  Filled 2013-08-30: qty 10

## 2013-08-30 MED ORDER — ONDANSETRON HCL 4 MG/2ML IJ SOLN
4.0000 mg | Freq: Four times a day (QID) | INTRAMUSCULAR | Status: DC | PRN
  Filled 2013-08-30: qty 2

## 2013-08-30 MED ORDER — PROPOFOL 10 MG/ML IV BOLUS
INTRAVENOUS | Status: AC
  Filled 2013-08-30: qty 20

## 2013-08-30 MED ORDER — ARTIFICIAL TEARS OP OINT
TOPICAL_OINTMENT | OPHTHALMIC | Status: AC
  Filled 2013-08-30: qty 3.5

## 2013-08-30 MED ORDER — FENTANYL CITRATE 0.05 MG/ML IJ SOLN
25.0000 ug | INTRAMUSCULAR | Status: DC | PRN
  Administered 2013-08-30 (×2): 25 ug via INTRAVENOUS

## 2013-08-30 MED ORDER — SUCCINYLCHOLINE CHLORIDE 20 MG/ML IJ SOLN
INTRAMUSCULAR | Status: DC | PRN
  Administered 2013-08-30: 100 mg via INTRAVENOUS

## 2013-08-30 MED ORDER — HYDROCODONE-ACETAMINOPHEN 5-325 MG PO TABS: 1.0000 | ORAL_TABLET | Freq: Four times a day (QID) | ORAL | Status: DC | PRN

## 2013-08-30 MED ORDER — LORATADINE 10 MG PO TABS
10.0000 mg | ORAL_TABLET | Freq: Every day | ORAL | Status: DC
  Administered 2013-08-31 – 2013-09-01 (×2): 10 mg via ORAL
  Filled 2013-08-30 (×2): qty 1

## 2013-08-30 MED ORDER — PANTOPRAZOLE SODIUM 40 MG PO TBEC
40.0000 mg | DELAYED_RELEASE_TABLET | Freq: Every day | ORAL | Status: DC
  Administered 2013-08-30 – 2013-09-01 (×3): 40 mg via ORAL
  Filled 2013-08-30 (×2): qty 1

## 2013-08-30 MED ORDER — PHENOL 1.4 % MT LIQD: 1.0000 | OROMUCOSAL | Status: DC | PRN

## 2013-08-30 MED ORDER — LACTATED RINGERS IV SOLN
INTRAVENOUS | Status: DC | PRN
  Administered 2013-08-30 (×2): via INTRAVENOUS

## 2013-08-30 MED ORDER — HYDROCODONE-ACETAMINOPHEN 5-325 MG PO TABS: 1.0000 | ORAL_TABLET | ORAL | Status: DC | PRN

## 2013-08-30 MED ORDER — FOLIC ACID 0.5 MG HALF TAB
500.0000 ug | ORAL_TABLET | Freq: Every day | ORAL | Status: DC
  Administered 2013-08-31 – 2013-09-01 (×2): 0.5 mg via ORAL
  Filled 2013-08-30 (×2): qty 1

## 2013-08-30 MED ORDER — FENTANYL CITRATE 0.05 MG/ML IJ SOLN
INTRAMUSCULAR | Status: DC | PRN
  Administered 2013-08-30 (×2): 50 ug via INTRAVENOUS
  Administered 2013-08-30: 25 ug via INTRAVENOUS

## 2013-08-30 MED ORDER — SODIUM CHLORIDE 0.9 % IV SOLN
INTRAVENOUS | Status: DC
  Administered 2013-08-30: 18:00:00 via INTRAVENOUS

## 2013-08-30 MED ORDER — MECLIZINE HCL 25 MG PO TABS
25.0000 mg | ORAL_TABLET | Freq: Every day | ORAL | Status: DC | PRN
  Filled 2013-08-30: qty 1

## 2013-08-30 MED ORDER — LIDOCAINE HCL 1 % IJ SOLN
INTRAMUSCULAR | Status: DC | PRN
  Administered 2013-08-30: 09:00:00 via INTRAMUSCULAR

## 2013-08-30 MED ORDER — PROPOFOL 10 MG/ML IV BOLUS
INTRAVENOUS | Status: DC | PRN
  Administered 2013-08-30: 120 mg via INTRAVENOUS

## 2013-08-30 MED ORDER — HYDROCODONE-ACETAMINOPHEN 5-325 MG PO TABS
1.0000 | ORAL_TABLET | ORAL | Status: DC | PRN
  Administered 2013-08-30 – 2013-08-31 (×7): 2 via ORAL
  Administered 2013-09-01: 1 via ORAL
  Administered 2013-09-01 (×2): 2 via ORAL
  Filled 2013-08-30 (×10): qty 2

## 2013-08-30 MED ORDER — ASPARTATE MG & K 90-90 MG PO CAPS: 1.0000 | ORAL_CAPSULE | ORAL | Status: DC

## 2013-08-30 MED ORDER — TRIAMTERENE-HCTZ 37.5-25 MG PO CAPS
1.0000 | ORAL_CAPSULE | Freq: Every day | ORAL | Status: DC | PRN
  Filled 2013-08-30: qty 1

## 2013-08-30 MED ORDER — DEXAMETHASONE SODIUM PHOSPHATE 4 MG/ML IJ SOLN
INTRAMUSCULAR | Status: AC
  Filled 2013-08-30: qty 2

## 2013-08-30 MED ORDER — 0.9 % SODIUM CHLORIDE (POUR BTL) OPTIME
TOPICAL | Status: DC | PRN
  Administered 2013-08-30: 1000 mL

## 2013-08-30 MED ORDER — ROCURONIUM BROMIDE 50 MG/5ML IV SOLN
INTRAVENOUS | Status: AC
  Filled 2013-08-30: qty 1

## 2013-08-30 MED ORDER — MENTHOL 3 MG MT LOZG: 1.0000 | LOZENGE | OROMUCOSAL | Status: DC | PRN

## 2013-08-30 MED ORDER — GLYCOPYRROLATE 0.2 MG/ML IJ SOLN
INTRAMUSCULAR | Status: DC | PRN
  Administered 2013-08-30: .4 mg via INTRAVENOUS

## 2013-08-30 MED ORDER — MORPHINE SULFATE 2 MG/ML IJ SOLN: 2.0000 mg | INTRAMUSCULAR | Status: DC | PRN

## 2013-08-30 MED ORDER — CHLORHEXIDINE GLUCONATE 4 % EX LIQD: 60.0000 mL | Freq: Once | CUTANEOUS | Status: DC

## 2013-08-30 MED ORDER — ONDANSETRON HCL 4 MG/2ML IJ SOLN
INTRAMUSCULAR | Status: DC | PRN
  Administered 2013-08-30: 4 mg via INTRAVENOUS

## 2013-08-30 MED ORDER — ASPIRIN 81 MG PO CHEW
81.0000 mg | CHEWABLE_TABLET | Freq: Every day | ORAL | Status: DC
  Administered 2013-08-30 – 2013-09-01 (×3): 81 mg via ORAL
  Filled 2013-08-30 (×3): qty 1

## 2013-08-30 MED ORDER — LIDOCAINE HCL (CARDIAC) 20 MG/ML IV SOLN
INTRAVENOUS | Status: AC
  Filled 2013-08-30: qty 5

## 2013-08-30 MED ORDER — BUPIVACAINE-EPINEPHRINE PF 0.5-1:200000 % IJ SOLN
INTRAMUSCULAR | Status: DC | PRN
  Administered 2013-08-30: 30 mL via PERINEURAL

## 2013-08-30 MED ORDER — FENTANYL CITRATE 0.05 MG/ML IJ SOLN
INTRAMUSCULAR | Status: AC
  Filled 2013-08-30: qty 2

## 2013-08-30 MED ORDER — METOCLOPRAMIDE HCL 5 MG/ML IJ SOLN
5.0000 mg | Freq: Three times a day (TID) | INTRAMUSCULAR | Status: DC | PRN
  Filled 2013-08-30: qty 2

## 2013-08-30 MED ORDER — ATENOLOL 50 MG PO TABS
50.0000 mg | ORAL_TABLET | Freq: Every day | ORAL | Status: DC
  Filled 2013-08-30 (×2): qty 1

## 2013-08-30 MED ORDER — NEOSTIGMINE METHYLSULFATE 1 MG/ML IJ SOLN
INTRAMUSCULAR | Status: DC | PRN
  Administered 2013-08-30: 3 mg via INTRAVENOUS

## 2013-08-30 MED ORDER — SIMVASTATIN 40 MG PO TABS
40.0000 mg | ORAL_TABLET | ORAL | Status: DC
  Administered 2013-08-30: 40 mg via ORAL
  Filled 2013-08-30 (×2): qty 1

## 2013-08-30 MED ORDER — MIDAZOLAM HCL 2 MG/2ML IJ SOLN
INTRAMUSCULAR | Status: AC
  Filled 2013-08-30: qty 2

## 2013-08-30 MED ORDER — ARTIFICIAL TEARS OP OINT
TOPICAL_OINTMENT | OPHTHALMIC | Status: DC | PRN
  Administered 2013-08-30: 1 via OPHTHALMIC

## 2013-08-30 MED ORDER — ONDANSETRON HCL 4 MG PO TABS
4.0000 mg | ORAL_TABLET | Freq: Four times a day (QID) | ORAL | Status: DC | PRN
  Filled 2013-08-30: qty 1

## 2013-08-30 MED ORDER — ONDANSETRON HCL 4 MG/2ML IJ SOLN
INTRAMUSCULAR | Status: AC
  Filled 2013-08-30: qty 2

## 2013-08-30 MED ORDER — BISACODYL 5 MG PO TBEC
5.0000 mg | DELAYED_RELEASE_TABLET | Freq: Every day | ORAL | Status: DC | PRN
  Administered 2013-08-31: 5 mg via ORAL
  Filled 2013-08-30: qty 1

## 2013-08-30 MED ORDER — ROCURONIUM BROMIDE 100 MG/10ML IV SOLN
INTRAVENOUS | Status: DC | PRN
  Administered 2013-08-30: 50 mg via INTRAVENOUS

## 2013-08-30 MED ORDER — METOCLOPRAMIDE HCL 5 MG PO TABS
5.0000 mg | ORAL_TABLET | Freq: Three times a day (TID) | ORAL | Status: DC | PRN
  Filled 2013-08-30: qty 2

## 2013-08-30 SURGICAL SUPPLY — 71 items
CEMENT HV SMART SET (Cement) ×2 IMPLANT
CLOSURE WOUND 1/2 X4 (GAUZE/BANDAGES/DRESSINGS) ×2
COVER SURGICAL LIGHT HANDLE (MISCELLANEOUS) ×2 IMPLANT
DRAPE INCISE IOBAN 66X45 STRL (DRAPES) ×6 IMPLANT
DRAPE ORTHO SPLIT 77X108 STRL (DRAPES) ×3
DRAPE SURG ORHT 6 SPLT 77X108 (DRAPES) ×1 IMPLANT
DRAPE U-SHAPE 47X51 STRL (DRAPES) ×3 IMPLANT
DRSG ADAPTIC 3X8 NADH LF (GAUZE/BANDAGES/DRESSINGS) ×2 IMPLANT
DRSG EMULSION OIL 3X3 NADH (GAUZE/BANDAGES/DRESSINGS) ×3 IMPLANT
DRSG PAD ABDOMINAL 8X10 ST (GAUZE/BANDAGES/DRESSINGS) ×3 IMPLANT
ELECT NDL BLADE 2-5/6 (NEEDLE) IMPLANT
ELECT NDL TIP 2.8 STRL (NEEDLE) IMPLANT
ELECT NEEDLE BLADE 2-5/6 (NEEDLE) ×3 IMPLANT
ELECT NEEDLE TIP 2.8 STRL (NEEDLE) ×3 IMPLANT
ELECT REM PT RETURN 9FT ADLT (ELECTROSURGICAL) ×3
ELECTRODE REM PT RTRN 9FT ADLT (ELECTROSURGICAL) ×1 IMPLANT
EPIPHYSIS SHOULD BODYSIZE 10-5 (Knees) ×2 IMPLANT
GLOVE BIO SURGEON STRL SZ 6.5 (GLOVE) ×1 IMPLANT
GLOVE BIO SURGEONS STRL SZ 6.5 (GLOVE) ×1
GLOVE BIOGEL PI IND STRL 6.5 (GLOVE) IMPLANT
GLOVE BIOGEL PI IND STRL 7.0 (GLOVE) IMPLANT
GLOVE BIOGEL PI INDICATOR 6.5 (GLOVE) ×2
GLOVE BIOGEL PI INDICATOR 7.0 (GLOVE) ×2
GLOVE BIOGEL PI ORTHO PRO 7.5 (GLOVE) ×2
GLOVE BIOGEL PI ORTHO PRO SZ8 (GLOVE) ×4
GLOVE ORTHO TXT STRL SZ7.5 (GLOVE) ×5 IMPLANT
GLOVE PI ORTHO PRO STRL 7.5 (GLOVE) ×1 IMPLANT
GLOVE PI ORTHO PRO STRL SZ8 (GLOVE) ×1 IMPLANT
GLOVE SURG ORTHO 8.5 STRL (GLOVE) ×6 IMPLANT
GOWN STRL REUS W/ TWL LRG LVL3 (GOWN DISPOSABLE) ×2 IMPLANT
GOWN STRL REUS W/ TWL XL LVL3 (GOWN DISPOSABLE) ×2 IMPLANT
GOWN STRL REUS W/TWL LRG LVL3 (GOWN DISPOSABLE) ×9
GOWN STRL REUS W/TWL XL LVL3 (GOWN DISPOSABLE) ×9
KIT BASIN OR (CUSTOM PROCEDURE TRAY) ×3 IMPLANT
KIT ROOM TURNOVER OR (KITS) ×3 IMPLANT
MANIFOLD NEPTUNE II (INSTRUMENTS) ×1 IMPLANT
NDL 1/2 CIR MAYO (NEEDLE) IMPLANT
NDL SUT 6 .5 CRC .975X.05 MAYO (NEEDLE) ×1 IMPLANT
NEEDLE 1/2 CIR MAYO (NEEDLE) ×3 IMPLANT
NEEDLE 22X1 1/2 (OR ONLY) (NEEDLE) ×2 IMPLANT
NEEDLE MAYO TAPER (NEEDLE)
NS IRRIG 1000ML POUR BTL (IV SOLUTION) ×3 IMPLANT
PACK SHOULDER (CUSTOM PROCEDURE TRAY) ×3 IMPLANT
PAD ARMBOARD 7.5X6 YLW CONV (MISCELLANEOUS) ×6 IMPLANT
PASSER SUT SWANSON 36MM LOOP (INSTRUMENTS) ×3 IMPLANT
SHOULDER HEAD ECCENTRIC 44X18M (Head) ×2 IMPLANT
SLING ARM LRG ADULT FOAM STRAP (SOFTGOODS) ×2 IMPLANT
SPONGE GAUZE 4X4 12PLY (GAUZE/BANDAGES/DRESSINGS) ×3 IMPLANT
SPONGE GAUZE 4X4 12PLY STER LF (GAUZE/BANDAGES/DRESSINGS) ×3 IMPLANT
SPONGE LAP 4X18 X RAY DECT (DISPOSABLE) ×6 IMPLANT
STAPLER VISISTAT 35W (STAPLE) ×3 IMPLANT
STEM STANDARD SZ 10 113MM (Stem) ×3 IMPLANT
STEM STD SZ 10 113MM (Stem) ×1 IMPLANT
STRIP CLOSURE SKIN 1/2X4 (GAUZE/BANDAGES/DRESSINGS) ×3 IMPLANT
SUCTION FRAZIER TIP 10 FR DISP (SUCTIONS) ×3 IMPLANT
SUT BONE WAX W31G (SUTURE) IMPLANT
SUT ETHIBOND NAB CT1 #1 30IN (SUTURE) ×6 IMPLANT
SUT FIBERWIRE #2 38 T-5 BLUE (SUTURE) ×33
SUT MNCRL AB 4-0 PS2 18 (SUTURE) ×3 IMPLANT
SUT VIC AB 0 CT1 27 (SUTURE) ×3
SUT VIC AB 0 CT1 27XBRD ANBCTR (SUTURE) ×1 IMPLANT
SUT VIC AB 2-0 CT1 27 (SUTURE) ×6
SUT VIC AB 2-0 CT1 TAPERPNT 27 (SUTURE) ×2 IMPLANT
SUT VICRYL 4-0 PS2 18IN ABS (SUTURE) ×3 IMPLANT
SUTURE FIBERWR #2 38 T-5 BLUE (SUTURE) ×11 IMPLANT
SYR CONTROL 10ML LL (SYRINGE) ×3 IMPLANT
TAPE CLOTH SURG 6X10 WHT LF (GAUZE/BANDAGES/DRESSINGS) ×2 IMPLANT
TOWEL OR 17X24 6PK STRL BLUE (TOWEL DISPOSABLE) ×3 IMPLANT
TOWEL OR 17X26 10 PK STRL BLUE (TOWEL DISPOSABLE) ×3 IMPLANT
WATER STERILE IRR 1000ML POUR (IV SOLUTION) ×3 IMPLANT
YANKAUER SUCT BULB TIP NO VENT (SUCTIONS) ×3 IMPLANT

## 2013-08-30 NOTE — Anesthesia Preprocedure Evaluation (Addendum)
Anesthesia Evaluation  Patient identified by MRN, date of birth, ID band Patient awake    Reviewed: Allergy & Precautions, H&P , NPO status , Patient's Chart, lab work & pertinent test results, reviewed documented beta blocker date and time   Airway Mallampati: III TM Distance: <3 FB Neck ROM: Limited  Mouth opening: Limited Mouth Opening  Dental   Pulmonary asthma , sleep apnea ,  breath sounds clear to auscultation        Cardiovascular hypertension, Pt. on medications and Pt. on home beta blockers + CAD and + Peripheral Vascular Disease Rhythm:Regular  Tenormin 50 mgs taken on 08-30-13 @ 02:30  Left carotid <40% ICA stenosis, Right OK   Neuro/Psych    GI/Hepatic negative GI ROS, Neg liver ROS,   Endo/Other    Renal/GU Renal disease     Musculoskeletal  (+) Arthritis -, Osteoarthritis,    Abdominal   Peds  Hematology negative hematology ROS (+)   Anesthesia Other Findings   Reproductive/Obstetrics                      Anesthesia Physical Anesthesia Plan  ASA: III  Anesthesia Plan: General   Post-op Pain Management:    Induction: Intravenous  Airway Management Planned: Oral ETT  Additional Equipment:   Intra-op Plan:   Post-operative Plan: Extubation in OR  Informed Consent:   Dental advisory given  Plan Discussed with: Anesthesiologist and CRNA  Anesthesia Plan Comments:        Anesthesia Quick Evaluation

## 2013-08-30 NOTE — Progress Notes (Signed)
Report to Ramond Dial for lunch relief.

## 2013-08-30 NOTE — Transfer of Care (Signed)
Immediate Anesthesia Transfer of Care Note  Patient: Lori Price  Procedure(s) Performed: Procedure(s): HEMI ARTHROPLASTY (Right)  Patient Location: PACU  Anesthesia Type:General  Level of Consciousness: awake, alert , oriented and patient cooperative  Airway & Oxygen Therapy: Patient Spontanous Breathing and Patient connected to nasal cannula oxygen  Post-op Assessment: Report given to PACU RN and Post -op Vital signs reviewed and stable  Post vital signs: Reviewed and stable  Complications: No apparent anesthesia complications

## 2013-08-30 NOTE — Brief Op Note (Signed)
08/30/2013  10:21 AM  PATIENT:  Lori Price  81 y.o. female  PRE-OPERATIVE DIAGNOSIS:  RIGHT SHOULDER PROXIMAL HUMERUS FRACTURE, COMMINUTED AND DISPLACED, WITH HEAD DISLOCATION  POST-OPERATIVE DIAGNOSIS:  RIGHT SHOULDER PROXIMAL HUMERUS FRACTURE, COMMINUTED AND DISPLACED, WITH HEAD DISLOCATION PROCEDURE:  Procedure(s): HEMI ARTHROPLASTY (Right) [employer]  SURGEON:  Surgeon(s) and Role:    * [name], MD - Primary  PHYSICIAN ASSISTANT:   ASSISTANTS: Ventura Bruns, PA-C   ANESTHESIA:   regional and general  EBL:  Total I/O In: 1000 [I.V.:1000] Out: 275 [Blood:275]  BLOOD ADMINISTERED:none  DRAINS: none   LOCAL MEDICATIONS USED:  MARCAINE     SPECIMEN:  No Specimen  DISPOSITION OF SPECIMEN:  N/A  COUNTS:  YES  TOURNIQUET:  * No tourniquets in log *  DICTATION: .Other Dictation: Dictation Number [id number]  PLAN OF CARE: Admit to inpatient   PATIENT DISPOSITION:  PACU - hemodynamically stable.   Delay start of Pharmacological VTE agent (>24hrs) due to surgical blood loss or risk of bleeding: no

## 2013-08-30 NOTE — Interval H&P Note (Signed)
History and Physical Interval Note:  08/30/2013 7:35 AM  Lori Price  has presented today for surgery, with the diagnosis of RIGHT SHOULDER PROXIMAL HUMERUS FRACTURE  The various methods of treatment have been discussed with the patient and family. After consideration of risks, benefits and other options for treatment, the patient has consented to  Procedure(s): OPEN REDUCTION INTERNAL FIXATION (ORIF)RIGHT SHOULDER FRACTURE VS HEMI ARTHROPLASTY (Right) as a surgical intervention .  The patient's history has been reviewed, patient examined, no change in status, stable for surgery.  I have reviewed the patient's chart and labs.  Questions were answered to the patient's satisfaction.     [name]

## 2013-08-30 NOTE — Anesthesia Postprocedure Evaluation (Signed)
  Anesthesia Post-op Note  Patient: Lori Price  Procedure(s) Performed: Procedure(s): HEMI ARTHROPLASTY (Right)  Patient Location: PACU  Anesthesia Type:General  Level of Consciousness: awake  Airway and Oxygen Therapy: Patient Spontanous Breathing  Post-op Pain: mild  Post-op Assessment: Post-op Vital signs reviewed  Post-op Vital Signs: Reviewed  Last Vitals:  Filed Vitals:   08/30/13 1409  BP: 154/71  Pulse: 48  Temp: 36.7 C  Resp: 15    Complications: No apparent anesthesia complications

## 2013-08-30 NOTE — Anesthesia Procedure Notes (Addendum)
Anesthesia Regional Block:  Interscalene brachial plexus block  Pre-Anesthetic Checklist: ,, timeout performed, Correct Patient, Correct Site, Correct Laterality, Correct Procedure, Correct Position, site marked, Risks and benefits discussed,  Surgical consent,  Pre-op evaluation,  At surgeon's request and post-op pain management  Laterality: Right  Prep: chloraprep       Needles:  Injection technique: Single-shot  Needle Type: Echogenic Stimulator Needle     Needle Length: 5cm 5 cm Needle Gauge: 22 and 22 G    Additional Needles:  Procedures: ultrasound guided (picture in chart) and nerve stimulator Interscalene brachial plexus block  Nerve Stimulator or Paresthesia:  Response: biceps flexion, 0.45 mA,   Additional Responses:   Narrative:  Start time: 08/30/2013 7:37 AM End time: 08/30/2013 7:49 AM Injection made incrementally with aspirations every 5 mL.  Performed by: Personally  Anesthesiologist: Dr Marcie Bal  Additional Notes: Functioning IV was confirmed and monitors were applied.  A 47mm 22ga Arrow echogenic stimulator needle was used. Sterile prep and drape,hand hygiene and sterile gloves were used.  Negative aspiration and negative test dose prior to incremental administration of local anesthetic. The patient tolerated the procedure well.  Ultrasound guidance: relevent anatomy identified, needle position confirmed, local anesthetic spread visualized around nerve(s), vascular puncture avoided.  Image printed for medical record.    Procedure Name: Intubation Date/Time: 08/30/2013 8:09 AM Performed by: Wanita Chamberlain Pre-anesthesia Checklist: Patient identified, Timeout performed, Emergency Drugs available, Suction available and Patient being monitored Patient Re-evaluated:Patient Re-evaluated prior to inductionOxygen Delivery Method: Circle system utilized Preoxygenation: Pre-oxygenation with 100% oxygen Intubation Type: IV induction Ventilation: Mask ventilation  without difficulty Tube size: 7.5 mm Number of attempts: 1 Airway Equipment and Method: Video-laryngoscopy and Rigid stylet Placement Confirmation: ETT inserted through vocal cords under direct vision,  breath sounds checked- equal and bilateral and positive ETCO2 Secured at: 22 cm Tube secured with: Tape Dental Injury: Teeth and Oropharynx as per pre-operative assessment  Difficulty Due To: Difficulty was anticipated, Difficult Airway- due to limited oral opening and Difficult Airway- due to anterior larynx Comments: Easy Video Glidescope intubation.

## 2013-08-30 NOTE — Progress Notes (Signed)
Utilization review completed.  

## 2013-08-31 LAB — BASIC METABOLIC PANEL
BUN: 26 mg/dL — ABNORMAL HIGH (ref 6–23)
CO2: 25 meq/L (ref 19–32)
Calcium: 9.3 mg/dL (ref 8.4–10.5)
Chloride: 102 mEq/L (ref 96–112)
Creatinine, Ser: 0.71 mg/dL (ref 0.50–1.10)
GFR calc Af Amer: >90 mL/min (ref >90)
GFR calc non Af Amer: 80 mL/min — ABNORMAL LOW (ref >90)
GLUCOSE: 114 mg/dL — AB (ref 70–99)
Potassium: 4.9 mEq/L (ref 3.7–5.3)
SODIUM: 141 meq/L (ref 137–147)

## 2013-08-31 LAB — HEMOGLOBIN AND HEMATOCRIT, BLOOD
HEMATOCRIT: 38.1 % (ref 36.0–46.0)
Hemoglobin: 12.3 g/dL (ref 12.0–15.0)

## 2013-08-31 NOTE — Progress Notes (Signed)
Occupational Therapy Evaluation Patient Details Name: Lori Price MRN: [id number] DOB: 10/18/1931 Today's Date: 08/31/2013    History of Present Illness Pt is 80 y.o Female s/p R shoulder hemiplasty (per MD note) for proximal humerus fracture.    Clinical Impression   PTA pt lived at home with husband. Since her fall, pt has needed assistance for ADLs. Pt ambulates at home with a 4 wheeled walker or cane and is at baseline for functional mobility. Education and training completed regarding shoulder protocol and compensatory techniques for UB ADLs. Sling management training was provided to pt and caregiver (husband) with their daughter also present. Pt performed AAROM of R shoulder with therapist/husband assist as well as AROM of elbow, wrist, and hand. Pt would benefit from skilled OT to increase independence with ADLs. Pt has no PT needs at this time and PT was notified.     Follow Up Recommendations  Home health OT;Supervision/Assistance - 24 hour    Equipment Recommendations  None recommended by OT       Precautions / Restrictions Precautions Precautions: Shoulder Type of Shoulder Precautions: No shoulder AROM; limit load bearing on RUE Shoulder Interventions: Shoulder sling/immobilizer Precaution Booklet Issued: Yes (comment) Precaution Comments: Educated pt on shoulder precautions and shoulder protocol Required Braces or Orthoses: Sling Restrictions Weight Bearing Restrictions: Yes RUE Weight Bearing: Non weight bearing (**limit load bearing through R shoulder)      Mobility Bed Mobility Overal bed mobility: Needs Assistance Bed Mobility: Supine to Sit     Supine to sit: HOB elevated;Modified independent (Device/Increase time) (use of hand rails (L))     General bed mobility comments: Pt reports she will sleep in a recliner at home.   Transfers Overall transfer level: Needs assistance Equipment used: Rolling walker (2 wheeled) (*pt now has quad cane in room to use  during mobility) Transfers: Sit to/from Stand Sit to Stand: Min guard         General transfer comment: Pt is at baseline for mobility/transfers. Pt now has quad cane in room to use during functional mobility.     Balance Overall balance assessment: Modified Independent (pt is at baseline with use of assistive devices)                                          ADL Overall ADL's : Needs assistance/impaired Eating/Feeding: Independent;Sitting   Grooming: Set up;Standing   Upper Body Bathing: Sitting;Moderate assistance   Lower Body Bathing: Sit to/from stand;Minimal assistance   Upper Body Dressing : Maximal assistance;Sitting   Lower Body Dressing: Sit to/from stand;Min guard   Toilet Transfer: Min guard;Ambulation;Comfort height toilet;RW;Grab bars   Toileting- Clothing Manipulation and Hygiene: Min guard;Sit to/from stand       Functional mobility during ADLs: Min guard;Rolling walker (*RW used for session as it was in the room; however provided a quad cane for pt to use while inpatient)       Vision  No change from baseline                          Pertinent Vitals/Pain Pt c/o mild- moderate pain throughout session, however does not provide numerical number. Pt reports increased pain during therapeutic exercise. Repositioned and offered ice.      Hand Dominance Left   Extremity/Trunk Assessment Upper Extremity Assessment Upper Extremity Assessment: RUE deficits/detail RUE Deficits /  Details: No shoulder AROM; pendulums and AAROM for FF and ER within pain limits; elbow, wrist, and hand AROM allowed RUE: Unable to fully assess due to pain;Unable to fully assess due to immobilization RUE Coordination: decreased gross motor;decreased fine motor   Lower Extremity Assessment Lower Extremity Assessment: Overall WFL for tasks assessed (pt at baseline with use of assistive devices)   Cervical / Trunk Assessment Cervical / Trunk Assessment:  Normal   Communication Communication Communication: No difficulties   Cognition Arousal/Alertness: Awake/alert Behavior During Therapy: WFL for tasks assessed/performed Overall Cognitive Status: Within Functional Limits for tasks assessed                        Exercises Exercises: Shoulder     Shoulder Instructions Shoulder Instructions Donning/doffing shirt without moving shoulder: Moderate assistance Method for sponge bathing under operated UE: Supervision/safety Donning/doffing sling/immobilizer: Moderate assistance Correct positioning of sling/immobilizer: Minimal assistance Pendulum exercises (written home exercise program): Min-guard ROM for elbow, wrist and digits of operated UE: Supervision/safety Sling wearing schedule (on at all times/off for ADL's): Supervision/safety Proper positioning of operated UE when showering: Supervision/safety Positioning of UE while sleeping: Long expects to be discharged to:: Private residence Living Arrangements: Spouse/significant other Available Help at Discharge: Family;Available 24 hours/day Type of Home: House Home Access: Other (comment);Level entry (small threshold at door)     Home Layout: Two level (has elevator)     Bathroom Shower/Tub: Tub/shower unit;Curtain Shower/tub characteristics: Architectural technologist: Handicapped height     Home Equipment: Grab bars - tub/shower;Hand held shower head;Cane - quad;Walker - 4 wheels;Shower seat          Prior Functioning/Environment Level of Independence: Needs assistance (prior to humerus fracture Independent; however required assi)  Gait / Transfers Assistance Needed: Pt uses 4 wheel walker or cane at home.  ADL's / Homemaking Assistance Needed: Since humerus fracture, pt has required assistance for ADLs. Pt reports that she house a cleaner who comes by to assist with homemaking.        OT Diagnosis: Generalized  weakness;Acute pain   OT Problem List: Decreased strength;Decreased range of motion;Impaired balance (sitting and/or standing);Decreased activity tolerance;Decreased safety awareness;Decreased knowledge of use of DME or AE;Decreased knowledge of precautions;Pain;Impaired UE functional use   OT Treatment/Interventions: Self-care/ADL training;Therapeutic exercise;Energy conservation;DME and/or AE instruction;Therapeutic activities;Patient/family education;Balance training    OT Goals(Current goals can be found in the care plan section) Acute Rehab OT Goals Patient Stated Goal: To return home OT Goal Formulation: With patient Time For Goal Achievement: 09/07/13 Potential to Achieve Goals: Good  OT Frequency: Min 2X/week    End of Session Equipment Utilized During Treatment: Rolling walker;Other (comment) (sling) Nurse Communication: Other (comment) (use of quad cane for mobility)  Activity Tolerance: Patient tolerated treatment well Patient left: in chair;with call bell/phone within reach;with family/visitor present   Time: 0737-1062 OT Time Calculation (min): 59 min Charges:  OT General Charges $OT Visit: 1 Procedure OT Evaluation $Initial OT Evaluation Tier I: 1 Procedure OT Treatments $Self Care/Home Management : 23-37 mins $Therapeutic Exercise: 23-37 mins  Juluis Rainier [phone] 08/31/2013, 10:52 AM

## 2013-08-31 NOTE — Progress Notes (Signed)
   Subjective: 1 Day Post-Op Procedure(s) (LRB): HEMI ARTHROPLASTY (Right)  Pt feels she is much better and pain is mild  Overall pt doing well Patient reports pain as mild.  Objective:   VITALS:   Filed Vitals:   08/31/13 0637  BP: 135/62  Pulse: 50  Temp: 98.2 F (36.8 C)  Resp: 18    Right shoulder dressing intact Sling in place nv intact distally No rashes edema or bleeding  LABS  Recent Labs  08/31/13 0435  HGB 12.3  HCT 38.1     Recent Labs  08/31/13 0435  NA 141  K 4.9  BUN 26*  CREATININE 0.71  GLUCOSE 114*     Assessment/Plan: 1 Day Post-Op Procedure(s) (LRB): HEMI ARTHROPLASTY (Right)   PT/OT today Plan for d/c tomorrow to home Pain control as needed   Merla Riches, MPAS, PA-C  08/31/2013, 7:19 AM

## 2013-08-31 NOTE — Op Note (Signed)
NAME:  Lori Price, Lori Price NO.:  [id number]  MEDICAL RECORD NO.:  [id number]  LOCATION:  [id number]                        FACILITY:  [facility]  PHYSICIAN:  Doran Heater. Veverly Fells, M.D. DATE OF BIRTH:  10/26/1931  DATE OF PROCEDURE:  08/30/2013 DATE OF DISCHARGE:                              OPERATIVE REPORT   PREOPERATIVE DIAGNOSIS:  Displaced comminuted right proximal humerus fracture with humeral head dislocation.  POSTOPERATIVE DIAGNOSES:  Displaced comminuted right proximal humerus fracture with humeral head dislocation.  PROCEDURE PERFORMED:  Right shoulder hemiarthroplasty using DePuy global unite system.  ATTENDING SURGEON:  Doran Heater. Veverly Fells, MD.  ASSISTANT:  Charletta Cousin Dixon, [place], who was scrubbed the entire procedure and necessary for satisfactory completion of surgery.  ANESTHESIA:  General anesthesia was used plus interscalene block.  ESTIMATED BLOOD LOSS:  Less than 100 mL.  FLUID REPLACEMENT:  1500 mL of crystalloid.  INSTRUMENT COUNTS:  Correct.  COMPLICATIONS:  There were no complications.  ANTIBIOTICS:  Perioperative antibiotics were given.  INDICATIONS:  Patient is a 80 year old female, who suffered a ground- level fall injuring her right shoulder.  Patient presents to orthopedics with comminuted displaced proximal humerus fracture with the head dislocation.  The patient was counseled regarding options for management with hemiarthroplasty for the shoulder being recommended.  The patient's family agreed to surgery.  Informed consent was obtained.  DESCRIPTION OF PROCEDURE:  After adequate level of anesthesia was achieved, the patient position modified beach-chair position.  Right shoulder correctly identified, sterilely prepped and draped in usual manner.  Time-out called.  We entered the shoulder in standard deltopectoral incision starting coracoid process extending down to the anterior humerus.  Dissection down to subcutaneous tissues using  needle- tip Bovie, identified cephalic vein, took it laterally the deltoid. Pectoralis taken medially.  Conjoined tendon identified and retracted medially.  The fracture plane was identified and the hematoma was evacuated.  We identified the lesser tuberosity, which was still attached to the humeral head, which was dislocated.  We osteotomized the lesser tuberosity and debulked that with a rongeur.  We then placed #2 FiberWire suture in a modified W stitch medial to the lesser tuberosity with 2 sutures coming out of the top of the lesser tuberosity and then placed into the subscapularis at the tendon-bone junction.  We then went ahead and delivered the humeral head out of the wound.  Sized to a 44 x 18 head.  We next the bulk of the greater tuberosity removing excess said that was still attached to the greater tuberosity and then placed #2 Fiber wires in a modified W stitch on the far side of lateral to the greater tuberosity and the rotator cuff x3.  Next, we went ahead and prepared the humeral shaft.  We tenotomized the biceps, inspected the glenoid face which was intact.  The labrum was intact.  There were some wear, but not excessive.  We then prepared the shaft reaming up to size 10.  We then trialed with a 10 stem with a -5 neck adapter and a 44 x 18 head and reduced the shoulder.  We checked our length, which was 2 laser marks on the stem in  terms of the humeral height and version we set with the anterior fin adjacent to the bicipital groove.  We removed the trial components, thoroughly irrigated and then dried the humeral canal.  We then used a DePuy high viscosity cement.  Once we had drill holes, drill holes in the anterior humerus centered around the biceps groove and placed #2 FiberWire sutures in that x2.  We cemented the stem in the position.  We placed a another suture through the medial fin and around the back of the humeral stem for around the __________ stitch.  Once  the stem was the appropriate height, we held that in position until cement was hardened, again we set our height off the trial and then our version off the anterior fin adjacent to biceps groove.  Once the cement was hardened, we impacted the humeral head and 2 onto the trunnion and reduced the shoulder, reduced the tuberosities.  We placed around the world stitch going lateral to the greater tuberosity medial to the lesser tuberosity with a free needle and then extensively bone grafted the anterior proximal portion of the global unite system and the stem and that had some pore coat on it for assisting and healing, but we wanted that entire exposed portion of the humeral stem to be covered with bone graft, which we were able to do from the humeral head.  We then placed our tuberosities into position.  We suture tuberosity initially with those W stitch sutures.  We then went ahead and took our shaft sutures and brought those out lateral to the greater tuberosity and medial to the lesser tuberosity and in a mattress fashion.  We placed 4 #2 fiber wires in a rotator interval suturing technique and we tied around the world stitch and compressed the entire construct together.  We then ranged the shoulder and had a nice stable construct everything moved together as a unit, nice and stable.  The appropriate version.  We thoroughly irrigated the wound.  The subdeltoid area and then closed deltopectoral with 0 Vicryl suture followed by 2-0 Vicryl subcutaneous closure and 4-0 Monocryl for skin.  Steri-Strips applied followed by sterile dressing.  The patient tolerated the surgery well.     Doran Heater. Veverly Fells, M.D.     SRN/MEDQ  D:  08/30/2013  T:  08/31/2013  Job:  [id number]

## 2013-08-31 NOTE — Progress Notes (Signed)
PT Cancellation and Discharge Note  Patient Details Name: [name] MRN: [id number] DOB: [birth year]   Cancelled Treatment:    Reason Eval/Treat Not Completed: PT screened, no needs identified, will sign off.  Spoke with OT who notes pt moving well and no needs for PT at this time.  Will sign off.     Thornton Papas Anglia Blakley 08/31/2013, 10:23 AM

## 2013-09-01 ENCOUNTER — Encounter (HOSPITAL_COMMUNITY): Payer: Self-pay | Admitting: Orthopedic Surgery

## 2013-09-01 MED ORDER — HYDROCODONE-ACETAMINOPHEN 5-325 MG PO TABS: 1.0000 | ORAL_TABLET | ORAL | Status: DC | PRN

## 2013-09-01 MED ORDER — METHOCARBAMOL 500 MG PO TABS: 500.0000 mg | ORAL_TABLET | Freq: Three times a day (TID) | ORAL | Status: DC | PRN

## 2013-09-01 MED ORDER — METHOCARBAMOL 500 MG PO TABS
500.0000 mg | ORAL_TABLET | Freq: Once | ORAL | Status: AC
  Administered 2013-09-01: 500 mg via ORAL
  Filled 2013-09-01: qty 1

## 2013-09-01 NOTE — Care Management Note (Signed)
CARE MANAGEMENT NOTE 09/01/2013  Patient:  Lori Price, Lori Price   Account Number:  [id number]  Date Initiated:  09/01/2013  Documentation initiated by:  Ricki Miller  Subjective/Objective Assessment:   80 yr old female s/p right shoulder arthroplasty.     Action/Plan:   Case manager spoke with patient concerning need for Home Health. Choice offered. Patient selected [facility], referral called to Cheryll Dessert, [place].   Anticipated DC Date:  09/01/2013   Anticipated DC Plan:  [place]  CM consult      [facility] Choice  HOME HEALTH   Choice offered to / List presented to:  C-1 Patient        [place] arranged  [place].   Status of service:  Completed, signed off Medicare Important Message given?   (If response is "NO", the following Medicare IM given date fields will be blank) Date Medicare IM given:   Date Additional Medicare IM given:    Discharge Disposition:  [facility]  Per UR Regulation:

## 2013-09-01 NOTE — Discharge Instructions (Signed)
Keep incision clean and dry and covered for one week and then ok to get wet in the shower.  No lifting pushing or pulling with the right arm  Sling on while up and around, may loosen sling while seated. Keep pillows propped behind the elbow to keep arm across stomach  Do exercises 3 times per day, lap slides, arm across waist, then out to knee(rotation) help with the left arm if needed Gentle elbow pendulums,  Active wrist and elbow ROM

## 2013-09-01 NOTE — Progress Notes (Signed)
Occupational Therapy Treatment Patient Details Name: Lori Price MRN: [id number] DOB: 21-Nov-1931 Today's Date: 09/01/2013    History of present illness Pt is 80 y.o Female s/p R shoulder hemiplasty (per MD note) for proximal humerus fracture.    OT comments  Pt seen today for ADL session and therapeutic exercise. Pt was dressed upon OT arrival. Education reviewed for compensatory techniques for UB ADLs and pt reports no concerns. Pt performed therapeutic exercises with therapist assist. Pt reports that MD has discontinued pendulums and has concern about full elbow extension. Pt is d/c home today with 24/7 assistance. Acute OT to sign off.    Follow Up Recommendations  Home health OT;Supervision/Assistance - 24 hour    Equipment Recommendations  None recommended by OT       Precautions / Restrictions Precautions Precautions: Shoulder Type of Shoulder Precautions: No shoulder AROM; limit load bearing on RUE Shoulder Interventions: Shoulder sling/immobilizer Precaution Booklet Issued: Yes (comment) Precaution Comments: Educated pt on shoulder precautions and shoulder protocol Required Braces or Orthoses: Sling Restrictions Weight Bearing Restrictions: Yes RUE Weight Bearing: Non weight bearing              ADL                                         General ADL Comments: Pt was dressed upon OT arrival today and states that she had help from her daughter but was able to follow compensatory techniques easily.                 Cognition  Alertness/Arousal: Alert/awake Behavior During Therapy: WFL for tasks assessed/performed Overall Cognitive Status: Within Functional Limits for tasks assessed                         Exercises Shoulder Exercises Shoulder Flexion: AAROM;Right;10 reps;Supine Shoulder External Rotation: AAROM;Right;10 reps;Supine Elbow Flexion: AROM;Right;10 reps;Supine Elbow Extension: AROM;Right;10 reps;Supine (therapist  assist for full extension) Wrist Flexion: AROM;Right;10 reps;Supine Wrist Extension: AROM;Right;10 reps;Supine Digit Composite Flexion: AROM;Right;10 reps;Supine Composite Extension: AROM;Right;10 reps;Supine Method for sponge bathing under operated UE: Supervision/safety Donning/doffing sling/immobilizer: Moderate assistance Correct positioning of sling/immobilizer: Supervision/safety Pendulum exercises (written home exercise program):  (per MD, discontinue pendulums until F/U) ROM for elbow, wrist and digits of operated UE: Supervision/safety Sling wearing schedule (on at all times/off for ADL's): Supervision/safety Proper positioning of operated UE when showering: Supervision/safety Positioning of UE while sleeping: Supervision/safety   Shoulder Instructions Shoulder Instructions Method for sponge bathing under operated UE: Supervision/safety Donning/doffing sling/immobilizer: Moderate assistance Correct positioning of sling/immobilizer: Supervision/safety Pendulum exercises (written home exercise program):  (per MD, discontinue pendulums until F/U) ROM for elbow, wrist and digits of operated UE: Supervision/safety Sling wearing schedule (on at all times/off for ADL's): Supervision/safety Proper positioning of operated UE when showering: Supervision/safety Positioning of UE while sleeping: Supervision/safety          Pertinent Vitals/ Pain       7.5/10; RN administered muscle relaxer during session as pt has maxed out pain med allowance         Frequency Min 2X/week     Progress Toward Goals  OT Goals(current goals can now be found in the care plan section)  Progress towards OT goals: Progressing toward goals     Plan Discharge plan remains appropriate       End of Session  Activity Tolerance Patient tolerated treatment well   Patient Left in chair;with call bell/phone within reach;with family/visitor present   Nurse Communication Other (comment) (pt ready for  D/C from OT standpoint)        Time: 2458-0998 OT Time Calculation (min): 24 min  Charges: OT General Charges $OT Visit: 1 Procedure OT Treatments $Therapeutic Activity: 8-22 mins $Therapeutic Exercise: 8-22 mins  Juluis Rainier [phone] 09/01/2013, 10:31 AM

## 2013-09-01 NOTE — Discharge Summary (Signed)
Physician Discharge Summary   Patient ID: Lori Price MRN: [id number] DOB/AGE: 18-Sep-1931 80 y.o.  Admit date: 08/30/2013 Discharge date: 09/01/2013  Admission Diagnoses:  Active Problems:   Fracture of proximal end of right humerus   Discharge Diagnoses:  Same   Surgeries: Procedure(s): HEMI ARTHROPLASTY on 08/30/2013   Consultants: OT  Discharged Condition: Stable  Hospital Course: Lori Price is an 80 y.o. female who was admitted 08/30/2013 with a chief complaint of right shoulder pain, and found to have a diagnosis of displaced and comminuted proximal humerus fracture.  They were brought to the operating room on 08/30/2013 and underwent the above named procedures.    The patient had an uncomplicated hospital course and was stable for discharge.  Recent vital signs:  Filed Vitals:   09/01/13 0600  BP: 143/63  Pulse: 53  Temp: 97.8 F (36.6 C)  Resp: 16    Recent laboratory studies:  Results for orders placed during the hospital encounter of 08/30/13  HEMOGLOBIN AND HEMATOCRIT, BLOOD      Result Value Ref Range   Hemoglobin 12.3  12.0 - 15.0 g/dL   HCT 38.1  36.0 - 61.9 %  BASIC METABOLIC PANEL      Result Value Ref Range   Sodium 141  137 - 147 mEq/L   Potassium 4.9  3.7 - 5.3 mEq/L   Chloride 102  96 - 112 mEq/L   CO2 25  19 - 32 mEq/L   Glucose, Bld 114 (*) 70 - 99 mg/dL   BUN 26 (*) 6 - 23 mg/dL   Creatinine, Ser 0.71  0.50 - 1.10 mg/dL   Calcium 9.3  8.4 - 10.5 mg/dL   GFR calc non Af Amer 80 (*) >90 mL/min   GFR calc Af Amer >90  >90 mL/min    Discharge Medications:     Medication List    ASK your doctor about these medications       Aspartate Mg & K 90-90 MG Caps  Take 1 capsule by mouth 3 (three) times a week. On Mon, Wed, Fri     aspirin 81 MG tablet  Take 81 mg by mouth daily.     atenolol 50 MG tablet  Commonly known as:  TENORMIN  Take 1 tablet (50 mg total) by mouth daily.     beclomethasone 40 MCG/ACT inhaler  Commonly known as:   QVAR  Inhale 1 puff into the lungs 2 (two) times daily as needed (for breathing difficulty during allergery season).     bisacodyl 5 MG EC tablet  Commonly known as:  DULCOLAX  Take 5 mg by mouth daily as needed for moderate constipation.     fexofenadine 180 MG tablet  Commonly known as:  ALLEGRA  Take 180 mg by mouth daily as needed for allergies.     Fish Oil 1000 MG Caps  Take 1,000 mg by mouth daily.     folic acid 509 MCG tablet  Commonly known as:  FOLVITE  Take 400 mcg by mouth daily.     HYDROcodone-acetaminophen 5-325 MG per tablet  Commonly known as:  NORCO/VICODIN  Take 1-2 tablets by mouth every 6 (six) hours as needed for moderate pain.     Magnesium 250 MG Tabs  Take 250 mg by mouth 2 (two) times daily.     meclizine 25 MG tablet  Commonly known as:  ANTIVERT  Take 25 mg by mouth daily as needed for dizziness.     pantoprazole 40  MG tablet  Commonly known as:  PROTONIX  Take 40 mg by mouth daily.     simvastatin 40 MG tablet  Commonly known as:  ZOCOR  Take 40 mg by mouth 2 (two) times a week. On Sun and Wed     triamterene-hydrochlorothiazide 37.5-25 MG per capsule  Commonly known as:  DYAZIDE  Take 1 capsule by mouth daily as needed (for fluid retension and swelling).        Diagnostic Studies: Dg Chest 2 View  08/27/2013   CLINICAL DATA:  Preoperative evaluation, history hypertension, asthma, coronary artery disease, peripheral vascular disease  EXAM: CHEST  2 VIEW  COMPARISON:  12/09/2008  FINDINGS: Enlargement of cardiac silhouette post CABG.  Atherosclerotic calcification aorta.  Mediastinal contours and pulmonary vascularity normal.  Bronchitic and chronic interstitial changes with minimal linear scarring in lingula.  No acute infiltrate, pleural effusion or pneumothorax.  Bones diffusely demineralized.  IMPRESSION: Enlargement of cardiac silhouette post CABG.  Chronic bronchitic changes with lingular scarring.   Electronically Signed   By: Lavonia Dana M.D.   On: 08/27/2013 12:47   Dg Shoulder Right  08/22/2013   CLINICAL DATA:  SHOULDER INJURY SHOULDER INJURY  EXAM: RIGHT SHOULDER - 2+ VIEW  COMPARISON:  No comparisons  FINDINGS: A comminuted impacted humeral humeral neck fracture appreciated. There is medial displacement and lateral angulation. Degenerative changes are appreciated within the shoulder. Right lung apex unremarkable.  IMPRESSION: Impacted humeral neck fracture.   Electronically Signed   By: Margaree Mackintosh M.D.   On: 08/22/2013 08:26   Ct Shoulder Right Wo Contrast  08/27/2013   CLINICAL DATA:  Right humeral neck fracture. NONSPECIFIC (ABNORMAL) FINDINGS ON RADIOLOGICAL AND OTHER EXAMINATION OF MUSCULOSKELETAL SYSTEM.  EXAM: CT OF THE RIGHT SHOULDER WITHOUT CONTRAST; 3-DIMENSIONAL CT IMAGE RENDERING ON INDEPENDENT WORKSTATION  TECHNIQUE: Multidetector CT imaging was performed according to the standard protocol. Multiplanar CT image reconstructions were also generated.; 3-dimensional CT images were rendered by post-processing of the original CT data on an independent workstation. The 3-dimensional CT images were interpreted and findings were reported in the accompanying complete CT report for this study  COMPARISON:  Radiographs dated 08/22/2013  FINDINGS: There is a comminuted impacted and of the displaced fracture of the right humeral head. All one component of the fracture is the lesser tuberosity and bicipital groove with the anterior medial aspect of the humeral head. This component is impacted in subluxed with respect to the glenoid.  The second component is the majority of the greater tuberosity in the posterior superior aspect of the humeral head. These fragments overlap. These of the 2 major fragments the humeral head.  There are moderate arthritic changes of the acromioclavicular joint but there is a normal type 2 acromion.  There are minimal degenerative changes of the glenoid. Scapula is intact.  IMPRESSION: Comminuted impacted  and displaced fractures of the humeral head. There are 2 major fragments that overlap and both major fracture fragments involve portions of the articular surface of the humeral head.  The 3D images better demonstrate the position of the multiple fracture fragments.   Electronically Signed   By: Rozetta Nunnery M.D.   On: 08/27/2013 15:28   Ct 3d Independent Darreld Mclean  08/27/2013   CLINICAL DATA:  Right humeral neck fracture. NONSPECIFIC (ABNORMAL) FINDINGS ON RADIOLOGICAL AND OTHER EXAMINATION OF MUSCULOSKELETAL SYSTEM.  EXAM: CT OF THE RIGHT SHOULDER WITHOUT CONTRAST; 3-DIMENSIONAL CT IMAGE RENDERING ON INDEPENDENT WORKSTATION  TECHNIQUE: Multidetector CT imaging was performed according to  the standard protocol. Multiplanar CT image reconstructions were also generated.; 3-dimensional CT images were rendered by post-processing of the original CT data on an independent workstation. The 3-dimensional CT images were interpreted and findings were reported in the accompanying complete CT report for this study  COMPARISON:  Radiographs dated 08/22/2013  FINDINGS: There is a comminuted impacted and of the displaced fracture of the right humeral head. All one component of the fracture is the lesser tuberosity and bicipital groove with the anterior medial aspect of the humeral head. This component is impacted in subluxed with respect to the glenoid.  The second component is the majority of the greater tuberosity in the posterior superior aspect of the humeral head. These fragments overlap. These of the 2 major fragments the humeral head.  There are moderate arthritic changes of the acromioclavicular joint but there is a normal type 2 acromion.  There are minimal degenerative changes of the glenoid. Scapula is intact.  IMPRESSION: Comminuted impacted and displaced fractures of the humeral head. There are 2 major fragments that overlap and both major fracture fragments involve portions of the articular surface of the humeral head.   The 3D images better demonstrate the position of the multiple fracture fragments.   Electronically Signed   By: Rozetta Nunnery M.D.   On: 08/27/2013 15:28   Dg Shoulder Right Port  08/30/2013   CLINICAL DATA:  80 year old female status post surgery. Initial encounter.  EXAM: PORTABLE RIGHT SHOULDER - 2+ VIEW  COMPARISON:  Preoperative CT 08/27/2013.  FINDINGS: Portable AP view at 1055 hrs. Sequelae of right proximal humerus arthroplasty. Hardware appears intact. Alignment with the glenoid appears normal on this single view. Sequelae of proximal right humerus fracture again noted.  IMPRESSION: ORIF proximal right humerus with no adverse features identified.   Electronically Signed   By: Lars Pinks M.D.   On: 08/30/2013 11:16    Disposition: 01-Home or Self Care       Future Appointments Provider Department Dept Phone   10/21/2013 2:00 PM [facility] [place] [phone]   10/21/2013 3:00 PM Sharmon Leyden Nickel, NP [facility] [place] [phone]      Follow-up Information   Follow up with Uzziah Rigg,STEVEN R, MD. Call in 2 weeks. [phone])    Specialty:  Orthopedic Surgery   Contact information:   [address] [place] [address] [phone]        Signed: [name] 09/01/2013, 8:05 AM

## 2013-09-01 NOTE — Progress Notes (Signed)
Orthopedics Progress Note  Subjective: I am ready to go home  Objective:  Filed Vitals:   09/01/13 0600  BP: 143/63  Pulse: 53  Temp: 97.8 F (36.6 C)  Resp: 16    General: Awake and alert  Musculoskeletal: right shoulder incision clean dry and intact, redressed Neurovascularly intact  Lab Results  Component Value Date   WBC 11.3* 08/27/2013   HGB 12.3 08/31/2013   HCT 38.1 08/31/2013   MCV 92.7 08/27/2013   PLT 223 08/27/2013       Component Value Date/Time   NA 141 08/31/2013 0435   K 4.9 08/31/2013 0435   CL 102 08/31/2013 0435   CO2 25 08/31/2013 0435   GLUCOSE 114* 08/31/2013 0435   BUN 26* 08/31/2013 0435   CREATININE 0.71 08/31/2013 0435   CALCIUM 9.3 08/31/2013 0435   GFRNONAA 80* 08/31/2013 0435   GFRAA >90 08/31/2013 0435    No results found for this basename: INR, PROTIME    Assessment/Plan: POD #2 s/p Procedure(s): right shoulder hemiarthroplasty for fracture Stable overnight, plan d/c today with HH OT/PT Follow up in two weeks Doran Heater. Veverly Fells, MD 09/01/2013 8:01 AM

## 2013-09-02 DIAGNOSIS — J45909 Unspecified asthma, uncomplicated: Secondary | ICD-10-CM | POA: Diagnosis not present

## 2013-09-02 DIAGNOSIS — Z5189 Encounter for other specified aftercare: Secondary | ICD-10-CM | POA: Diagnosis not present

## 2013-09-02 DIAGNOSIS — I2510 Atherosclerotic heart disease of native coronary artery without angina pectoris: Secondary | ICD-10-CM | POA: Diagnosis not present

## 2013-09-02 DIAGNOSIS — Z96619 Presence of unspecified artificial shoulder joint: Secondary | ICD-10-CM | POA: Diagnosis not present

## 2013-09-02 DIAGNOSIS — S42309D Unspecified fracture of shaft of humerus, unspecified arm, subsequent encounter for fracture with routine healing: Secondary | ICD-10-CM | POA: Diagnosis not present

## 2013-09-02 DIAGNOSIS — M25569 Pain in unspecified knee: Secondary | ICD-10-CM | POA: Diagnosis not present

## 2013-09-03 DIAGNOSIS — J45909 Unspecified asthma, uncomplicated: Secondary | ICD-10-CM | POA: Diagnosis not present

## 2013-09-03 DIAGNOSIS — I2510 Atherosclerotic heart disease of native coronary artery without angina pectoris: Secondary | ICD-10-CM | POA: Diagnosis not present

## 2013-09-03 DIAGNOSIS — S42309D Unspecified fracture of shaft of humerus, unspecified arm, subsequent encounter for fracture with routine healing: Secondary | ICD-10-CM | POA: Diagnosis not present

## 2013-09-03 DIAGNOSIS — Z96619 Presence of unspecified artificial shoulder joint: Secondary | ICD-10-CM | POA: Diagnosis not present

## 2013-09-03 DIAGNOSIS — M25569 Pain in unspecified knee: Secondary | ICD-10-CM | POA: Diagnosis not present

## 2013-09-03 DIAGNOSIS — Z5189 Encounter for other specified aftercare: Secondary | ICD-10-CM | POA: Diagnosis not present

## 2013-09-07 DIAGNOSIS — J45909 Unspecified asthma, uncomplicated: Secondary | ICD-10-CM | POA: Diagnosis not present

## 2013-09-07 DIAGNOSIS — Z96619 Presence of unspecified artificial shoulder joint: Secondary | ICD-10-CM | POA: Diagnosis not present

## 2013-09-07 DIAGNOSIS — I2510 Atherosclerotic heart disease of native coronary artery without angina pectoris: Secondary | ICD-10-CM | POA: Diagnosis not present

## 2013-09-07 DIAGNOSIS — S42309D Unspecified fracture of shaft of humerus, unspecified arm, subsequent encounter for fracture with routine healing: Secondary | ICD-10-CM | POA: Diagnosis not present

## 2013-09-07 DIAGNOSIS — M25569 Pain in unspecified knee: Secondary | ICD-10-CM | POA: Diagnosis not present

## 2013-09-07 DIAGNOSIS — Z5189 Encounter for other specified aftercare: Secondary | ICD-10-CM | POA: Diagnosis not present

## 2013-09-10 DIAGNOSIS — M25569 Pain in unspecified knee: Secondary | ICD-10-CM | POA: Diagnosis not present

## 2013-09-10 DIAGNOSIS — I2510 Atherosclerotic heart disease of native coronary artery without angina pectoris: Secondary | ICD-10-CM | POA: Diagnosis not present

## 2013-09-10 DIAGNOSIS — Z5189 Encounter for other specified aftercare: Secondary | ICD-10-CM | POA: Diagnosis not present

## 2013-09-10 DIAGNOSIS — Z96619 Presence of unspecified artificial shoulder joint: Secondary | ICD-10-CM | POA: Diagnosis not present

## 2013-09-10 DIAGNOSIS — S42309D Unspecified fracture of shaft of humerus, unspecified arm, subsequent encounter for fracture with routine healing: Secondary | ICD-10-CM | POA: Diagnosis not present

## 2013-09-10 DIAGNOSIS — J45909 Unspecified asthma, uncomplicated: Secondary | ICD-10-CM | POA: Diagnosis not present

## 2013-09-13 DIAGNOSIS — S42309D Unspecified fracture of shaft of humerus, unspecified arm, subsequent encounter for fracture with routine healing: Secondary | ICD-10-CM | POA: Diagnosis not present

## 2013-09-13 DIAGNOSIS — Z96619 Presence of unspecified artificial shoulder joint: Secondary | ICD-10-CM | POA: Diagnosis not present

## 2013-09-13 DIAGNOSIS — Z5189 Encounter for other specified aftercare: Secondary | ICD-10-CM | POA: Diagnosis not present

## 2013-09-13 DIAGNOSIS — I2510 Atherosclerotic heart disease of native coronary artery without angina pectoris: Secondary | ICD-10-CM | POA: Diagnosis not present

## 2013-09-13 DIAGNOSIS — J45909 Unspecified asthma, uncomplicated: Secondary | ICD-10-CM | POA: Diagnosis not present

## 2013-09-13 DIAGNOSIS — M25569 Pain in unspecified knee: Secondary | ICD-10-CM | POA: Diagnosis not present

## 2013-09-14 DIAGNOSIS — M25569 Pain in unspecified knee: Secondary | ICD-10-CM | POA: Diagnosis not present

## 2013-09-14 DIAGNOSIS — Z5189 Encounter for other specified aftercare: Secondary | ICD-10-CM | POA: Diagnosis not present

## 2013-09-14 DIAGNOSIS — Z96619 Presence of unspecified artificial shoulder joint: Secondary | ICD-10-CM | POA: Diagnosis not present

## 2013-09-14 DIAGNOSIS — J45909 Unspecified asthma, uncomplicated: Secondary | ICD-10-CM | POA: Diagnosis not present

## 2013-09-14 DIAGNOSIS — I2510 Atherosclerotic heart disease of native coronary artery without angina pectoris: Secondary | ICD-10-CM | POA: Diagnosis not present

## 2013-09-14 DIAGNOSIS — S42309D Unspecified fracture of shaft of humerus, unspecified arm, subsequent encounter for fracture with routine healing: Secondary | ICD-10-CM | POA: Diagnosis not present

## 2013-09-15 DIAGNOSIS — Z96619 Presence of unspecified artificial shoulder joint: Secondary | ICD-10-CM | POA: Diagnosis not present

## 2013-09-16 DIAGNOSIS — I2510 Atherosclerotic heart disease of native coronary artery without angina pectoris: Secondary | ICD-10-CM | POA: Diagnosis not present

## 2013-09-16 DIAGNOSIS — Z96619 Presence of unspecified artificial shoulder joint: Secondary | ICD-10-CM | POA: Diagnosis not present

## 2013-09-16 DIAGNOSIS — M25569 Pain in unspecified knee: Secondary | ICD-10-CM | POA: Diagnosis not present

## 2013-09-16 DIAGNOSIS — J45909 Unspecified asthma, uncomplicated: Secondary | ICD-10-CM | POA: Diagnosis not present

## 2013-09-16 DIAGNOSIS — S42309D Unspecified fracture of shaft of humerus, unspecified arm, subsequent encounter for fracture with routine healing: Secondary | ICD-10-CM | POA: Diagnosis not present

## 2013-09-16 DIAGNOSIS — Z5189 Encounter for other specified aftercare: Secondary | ICD-10-CM | POA: Diagnosis not present

## 2013-09-21 DIAGNOSIS — I2510 Atherosclerotic heart disease of native coronary artery without angina pectoris: Secondary | ICD-10-CM | POA: Diagnosis not present

## 2013-09-21 DIAGNOSIS — Z5189 Encounter for other specified aftercare: Secondary | ICD-10-CM | POA: Diagnosis not present

## 2013-09-21 DIAGNOSIS — J45909 Unspecified asthma, uncomplicated: Secondary | ICD-10-CM | POA: Diagnosis not present

## 2013-09-21 DIAGNOSIS — M25569 Pain in unspecified knee: Secondary | ICD-10-CM | POA: Diagnosis not present

## 2013-09-21 DIAGNOSIS — S42309D Unspecified fracture of shaft of humerus, unspecified arm, subsequent encounter for fracture with routine healing: Secondary | ICD-10-CM | POA: Diagnosis not present

## 2013-09-21 DIAGNOSIS — Z96619 Presence of unspecified artificial shoulder joint: Secondary | ICD-10-CM | POA: Diagnosis not present

## 2013-09-23 DIAGNOSIS — Z96619 Presence of unspecified artificial shoulder joint: Secondary | ICD-10-CM | POA: Diagnosis not present

## 2013-09-23 DIAGNOSIS — J45909 Unspecified asthma, uncomplicated: Secondary | ICD-10-CM | POA: Diagnosis not present

## 2013-09-23 DIAGNOSIS — M25569 Pain in unspecified knee: Secondary | ICD-10-CM | POA: Diagnosis not present

## 2013-09-23 DIAGNOSIS — I2510 Atherosclerotic heart disease of native coronary artery without angina pectoris: Secondary | ICD-10-CM | POA: Diagnosis not present

## 2013-09-23 DIAGNOSIS — Z5189 Encounter for other specified aftercare: Secondary | ICD-10-CM | POA: Diagnosis not present

## 2013-09-23 DIAGNOSIS — S42309D Unspecified fracture of shaft of humerus, unspecified arm, subsequent encounter for fracture with routine healing: Secondary | ICD-10-CM | POA: Diagnosis not present

## 2013-09-27 DIAGNOSIS — Z96619 Presence of unspecified artificial shoulder joint: Secondary | ICD-10-CM | POA: Diagnosis not present

## 2013-09-27 DIAGNOSIS — J45909 Unspecified asthma, uncomplicated: Secondary | ICD-10-CM | POA: Diagnosis not present

## 2013-09-27 DIAGNOSIS — Z5189 Encounter for other specified aftercare: Secondary | ICD-10-CM | POA: Diagnosis not present

## 2013-09-27 DIAGNOSIS — S42309D Unspecified fracture of shaft of humerus, unspecified arm, subsequent encounter for fracture with routine healing: Secondary | ICD-10-CM | POA: Diagnosis not present

## 2013-09-27 DIAGNOSIS — M25569 Pain in unspecified knee: Secondary | ICD-10-CM | POA: Diagnosis not present

## 2013-09-27 DIAGNOSIS — I2510 Atherosclerotic heart disease of native coronary artery without angina pectoris: Secondary | ICD-10-CM | POA: Diagnosis not present

## 2013-09-29 DIAGNOSIS — Z5189 Encounter for other specified aftercare: Secondary | ICD-10-CM | POA: Diagnosis not present

## 2013-09-29 DIAGNOSIS — Z96619 Presence of unspecified artificial shoulder joint: Secondary | ICD-10-CM | POA: Diagnosis not present

## 2013-09-29 DIAGNOSIS — S42309D Unspecified fracture of shaft of humerus, unspecified arm, subsequent encounter for fracture with routine healing: Secondary | ICD-10-CM | POA: Diagnosis not present

## 2013-09-29 DIAGNOSIS — J45909 Unspecified asthma, uncomplicated: Secondary | ICD-10-CM | POA: Diagnosis not present

## 2013-09-29 DIAGNOSIS — M25569 Pain in unspecified knee: Secondary | ICD-10-CM | POA: Diagnosis not present

## 2013-09-29 DIAGNOSIS — I2510 Atherosclerotic heart disease of native coronary artery without angina pectoris: Secondary | ICD-10-CM | POA: Diagnosis not present

## 2013-10-05 DIAGNOSIS — I2510 Atherosclerotic heart disease of native coronary artery without angina pectoris: Secondary | ICD-10-CM | POA: Diagnosis not present

## 2013-10-05 DIAGNOSIS — S42309D Unspecified fracture of shaft of humerus, unspecified arm, subsequent encounter for fracture with routine healing: Secondary | ICD-10-CM | POA: Diagnosis not present

## 2013-10-05 DIAGNOSIS — M25569 Pain in unspecified knee: Secondary | ICD-10-CM | POA: Diagnosis not present

## 2013-10-05 DIAGNOSIS — J45909 Unspecified asthma, uncomplicated: Secondary | ICD-10-CM | POA: Diagnosis not present

## 2013-10-05 DIAGNOSIS — Z96619 Presence of unspecified artificial shoulder joint: Secondary | ICD-10-CM | POA: Diagnosis not present

## 2013-10-05 DIAGNOSIS — Z5189 Encounter for other specified aftercare: Secondary | ICD-10-CM | POA: Diagnosis not present

## 2013-10-08 DIAGNOSIS — S42309D Unspecified fracture of shaft of humerus, unspecified arm, subsequent encounter for fracture with routine healing: Secondary | ICD-10-CM | POA: Diagnosis not present

## 2013-10-08 DIAGNOSIS — M25569 Pain in unspecified knee: Secondary | ICD-10-CM | POA: Diagnosis not present

## 2013-10-08 DIAGNOSIS — I2510 Atherosclerotic heart disease of native coronary artery without angina pectoris: Secondary | ICD-10-CM | POA: Diagnosis not present

## 2013-10-08 DIAGNOSIS — Z96619 Presence of unspecified artificial shoulder joint: Secondary | ICD-10-CM | POA: Diagnosis not present

## 2013-10-08 DIAGNOSIS — J45909 Unspecified asthma, uncomplicated: Secondary | ICD-10-CM | POA: Diagnosis not present

## 2013-10-08 DIAGNOSIS — Z5189 Encounter for other specified aftercare: Secondary | ICD-10-CM | POA: Diagnosis not present

## 2013-10-13 ENCOUNTER — Other Ambulatory Visit (HOSPITAL_COMMUNITY): Payer: Self-pay | Admitting: Orthopedic Surgery

## 2013-10-13 ENCOUNTER — Ambulatory Visit (HOSPITAL_COMMUNITY)
Admission: RE | Admit: 2013-10-13 | Discharge: 2013-10-13 | Disposition: A | Discharge status: Home / Self Care | Payer: Medicare Other | Source: Ambulatory Visit | Attending: Internal Medicine | Admitting: Internal Medicine

## 2013-10-13 DIAGNOSIS — I82409 Acute embolism and thrombosis of unspecified deep veins of unspecified lower extremity: Secondary | ICD-10-CM

## 2013-10-13 DIAGNOSIS — M7989 Other specified soft tissue disorders: Secondary | ICD-10-CM

## 2013-10-13 DIAGNOSIS — M79609 Pain in unspecified limb: Secondary | ICD-10-CM | POA: Insufficient documentation

## 2013-10-13 DIAGNOSIS — Z96619 Presence of unspecified artificial shoulder joint: Secondary | ICD-10-CM | POA: Diagnosis not present

## 2013-10-13 NOTE — Progress Notes (Signed)
Right upper ext. Venous duplex completed. Negative for DVT. Oda Cogan, BS, RDMS, RVT

## 2013-10-14 DIAGNOSIS — I2510 Atherosclerotic heart disease of native coronary artery without angina pectoris: Secondary | ICD-10-CM | POA: Diagnosis not present

## 2013-10-14 DIAGNOSIS — J45909 Unspecified asthma, uncomplicated: Secondary | ICD-10-CM | POA: Diagnosis not present

## 2013-10-14 DIAGNOSIS — S42309D Unspecified fracture of shaft of humerus, unspecified arm, subsequent encounter for fracture with routine healing: Secondary | ICD-10-CM | POA: Diagnosis not present

## 2013-10-14 DIAGNOSIS — Z5189 Encounter for other specified aftercare: Secondary | ICD-10-CM | POA: Diagnosis not present

## 2013-10-14 DIAGNOSIS — M25569 Pain in unspecified knee: Secondary | ICD-10-CM | POA: Diagnosis not present

## 2013-10-14 DIAGNOSIS — Z96619 Presence of unspecified artificial shoulder joint: Secondary | ICD-10-CM | POA: Diagnosis not present

## 2013-10-19 ENCOUNTER — Ambulatory Visit: Payer: Medicare Other | Admitting: Vascular Surgery

## 2013-10-19 ENCOUNTER — Other Ambulatory Visit (HOSPITAL_COMMUNITY): Payer: Medicare Other

## 2013-10-19 DIAGNOSIS — S42309D Unspecified fracture of shaft of humerus, unspecified arm, subsequent encounter for fracture with routine healing: Secondary | ICD-10-CM | POA: Diagnosis not present

## 2013-10-19 DIAGNOSIS — J45909 Unspecified asthma, uncomplicated: Secondary | ICD-10-CM | POA: Diagnosis not present

## 2013-10-19 DIAGNOSIS — Z96619 Presence of unspecified artificial shoulder joint: Secondary | ICD-10-CM | POA: Diagnosis not present

## 2013-10-19 DIAGNOSIS — I2510 Atherosclerotic heart disease of native coronary artery without angina pectoris: Secondary | ICD-10-CM | POA: Diagnosis not present

## 2013-10-19 DIAGNOSIS — M25569 Pain in unspecified knee: Secondary | ICD-10-CM | POA: Diagnosis not present

## 2013-10-19 DIAGNOSIS — Z5189 Encounter for other specified aftercare: Secondary | ICD-10-CM | POA: Diagnosis not present

## 2013-10-20 ENCOUNTER — Encounter: Payer: Self-pay | Admitting: Family

## 2013-10-21 ENCOUNTER — Ambulatory Visit (HOSPITAL_COMMUNITY)
Admission: RE | Admit: 2013-10-21 | Discharge: 2013-10-21 | Disposition: A | Discharge status: Home / Self Care | Payer: Medicare Other | Source: Ambulatory Visit | Attending: Family | Admitting: Family

## 2013-10-21 ENCOUNTER — Ambulatory Visit (INDEPENDENT_AMBULATORY_CARE_PROVIDER_SITE_OTHER): Payer: Medicare Other | Admitting: Family

## 2013-10-21 ENCOUNTER — Encounter: Payer: Self-pay | Admitting: Family

## 2013-10-21 VITALS — BP 131/78 | HR 60 | Resp 16 | Ht 66.5 in | Wt 225.0 lb

## 2013-10-21 DIAGNOSIS — Z48812 Encounter for surgical aftercare following surgery on the circulatory system: Secondary | ICD-10-CM

## 2013-10-21 DIAGNOSIS — I6529 Occlusion and stenosis of unspecified carotid artery: Secondary | ICD-10-CM | POA: Insufficient documentation

## 2013-10-21 DIAGNOSIS — Z96619 Presence of unspecified artificial shoulder joint: Secondary | ICD-10-CM | POA: Diagnosis not present

## 2013-10-21 DIAGNOSIS — J45909 Unspecified asthma, uncomplicated: Secondary | ICD-10-CM | POA: Diagnosis not present

## 2013-10-21 DIAGNOSIS — S42309D Unspecified fracture of shaft of humerus, unspecified arm, subsequent encounter for fracture with routine healing: Secondary | ICD-10-CM | POA: Diagnosis not present

## 2013-10-21 DIAGNOSIS — Z5189 Encounter for other specified aftercare: Secondary | ICD-10-CM | POA: Diagnosis not present

## 2013-10-21 DIAGNOSIS — I2510 Atherosclerotic heart disease of native coronary artery without angina pectoris: Secondary | ICD-10-CM | POA: Diagnosis not present

## 2013-10-21 DIAGNOSIS — M25569 Pain in unspecified knee: Secondary | ICD-10-CM | POA: Diagnosis not present

## 2013-10-21 NOTE — Patient Instructions (Signed)

## 2013-10-21 NOTE — Progress Notes (Signed)
Established Carotid Patient   History of Present Illness  Lori Price is a 80 y.o. female patient of Dr. Scot Dock who is s/p right carotid endarterectomy on 05/22/2005 by Dr. Amedeo Plenty. She returns today for follow up. Pt denies any remote or recent history of stroke or TIA, but states she had a large MI in 2001, had 4 vessel CABG.   The patient denies amaurosis fugax or monocular blindness.  The patient  denies facial drooping.  Pt. denies hemiplegia.  The patient denies receptive or expressive aphasia.  Pt. denies extremity weakness.  Pt reports New Medical or Surgical History: falling this year and needing right shoulder surgery in April, 2015.  She does not use ETOH, has "bad knees", needs left knee replacement but needs to wait for the right shoulder to heal.  Pt Diabetic: No Pt smoker: non-smoker  Pt meds include: Statin : Yes, takes a few times/week as daily use causes arthralgias ASA: Yes Other anticoagulants/antiplatelets: no   Past Medical History  Diagnosis Date  . Coronary artery disease     s/p MI 2001--(catheterizations in 2001 with left main 70% stenosis, LAD 70% followed by 90% stenosis, circumflex 99% stenosis, right coronary artery occluded.   . Peripheral vascular disease   . Hyperlipidemia   . Vertigo   . Nephrolithiasis   . Chronic cough   . Asthma   . Complication of anesthesia     dental work  bp dropped  . Sleep apnea     CPAP   no  . Hypertension     dr Percival Spanish  . Carotid artery occlusion     Social History History  Substance Use Topics  . Smoking status: Never Smoker   . Smokeless tobacco: Never Used  . Alcohol Use: No    Family History Family History  Problem Relation Age of Onset  . Ovarian cancer Mother   . Cancer Mother     Ovarian  . Stroke Father   . COPD Father     Surgical History Past Surgical History  Procedure Laterality Date  . Knee arthoscopy    . Coronary artery bypass graft      2001 (LIMA to LAD, SVG to obtuse  marginal, sequential  SVG to RV , marginal branch in the distal right coronary artery.  . Tonsillectomy    . Right carotid endarterectomy    . Left carpal tunnel    . Carpal tunnel release Right   . Cardiac catheterization      2001  . Orif shoulder fracture Right 08/30/2013    Procedure: HEMI ARTHROPLASTY;  Surgeon: [name], MD;  Location: [facility];  Service: Orthopedics;  Laterality: Right;  . Carotid endarterectomy Right     cea  Dr. Amedeo Plenty    Allergies  Allergen Reactions  . Oxycodone Hives and Itching  . Sulfonamide Derivatives Rash  . Ace Inhibitors     cough  . Codeine Hives and Itching  . Eggs Or Egg-Derived Products Hives, Diarrhea and Other (See Comments)    Pt stated "My esophagus swells, I get hives, diarrhea, and I pass out"  . Losartan Potassium     cough  . Quinapril     REACTION: cough  . Sibutramine     REACTION: palpitations    Current Outpatient Prescriptions  Medication Sig Dispense Refill  . acetaminophen (TYLENOL) 325 MG tablet Take 650 mg by mouth as needed.      Marland Kitchen aspirin 81 MG tablet Take 81 mg by mouth  daily.        . atenolol (TENORMIN) 50 MG tablet Take 1 tablet (50 mg total) by mouth daily.  90 tablet  3  . beclomethasone (QVAR) 40 MCG/ACT inhaler Inhale 1 puff into the lungs 2 (two) times daily as needed (for breathing difficulty during allergery season).       . bisacodyl (DULCOLAX) 5 MG EC tablet Take 5 mg by mouth daily as needed for moderate constipation.       . fexofenadine (ALLEGRA) 180 MG tablet Take 180 mg by mouth daily as needed for allergies.       . folic acid (FOLVITE) 237 MCG tablet Take 400 mcg by mouth daily.        Marland Kitchen HYDROcodone-acetaminophen (NORCO/VICODIN) 5-325 MG per tablet Take 1-2 tablets by mouth as needed for moderate pain.       [name] Aspart-Potassium Aspart (ASPARTATE MG & K) 90-90 MG CAPS Take 1 capsule by mouth 3 (three) times a week. On Mon, Wed, Fri      . Magnesium 250 MG TABS Take 250 mg by mouth 2 (two) times  daily.       . meclizine (ANTIVERT) 25 MG tablet Take 25 mg by mouth daily as needed for dizziness.       . methocarbamol (ROBAXIN) 500 MG tablet Take 1 tablet (500 mg total) by mouth 3 (three) times daily as needed for muscle spasms.  60 tablet  1  . Omega-3 Fatty Acids (FISH OIL) 1000 MG CAPS Take 1,000 mg by mouth daily.       . pantoprazole (PROTONIX) 40 MG tablet Take 40 mg by mouth daily.       . simvastatin (ZOCOR) 40 MG tablet Take 40 mg by mouth 2 (two) times a week. On Sun and Wed      . triamterene-hydrochlorothiazide (DYAZIDE) 37.5-25 MG per capsule Take 1 capsule by mouth daily as needed (for fluid retension and swelling).      Marland Kitchen HYDROcodone-acetaminophen (NORCO) 5-325 MG per tablet Take 1-2 tablets by mouth every 4 (four) hours as needed for moderate pain.  60 tablet  0   No current facility-administered medications for this visit.    Review of Systems : See HPI for pertinent positives and negatives.  Physical Examination  Filed Vitals:   10/21/13 1510  BP: 131/78  Pulse: 60  Resp: 16   Body mass index is 35.78 kg/(m^2).  General: WDWN obese female in NAD GAIT: antalgic, with quad cane Eyes: PERRLA Pulmonary:  Non-labored, CTAB, Negative  Rales, Negative rhonchi, & Negative wheezing.  Cardiac: regular Rhythm ,  Positive detected murmur.  VASCULAR EXAM Carotid Bruits Left Right   Negative Transmitted cardiac murmur    Radial pulses are 2+ palpable and equal.                                                                                                                            LE Pulses LEFT RIGHT  POPLITEAL  not palpable   not palpable       POSTERIOR TIBIAL  not palpable   2+ palpable        DORSALIS PEDIS      ANTERIOR TIBIAL 2+ palpable  not palpable     Gastrointestinal: soft, nontender, BS WNL, no r/g,  negative masses.  Musculoskeletal: Negative muscle atrophy/wasting. M/S 5/5 throughout except 3/5 in right UE, Extremities without ischemic  changes.  Neurologic: A&O X 3; Appropriate Affect ; SENSATION ;normal;  Speech is normal CN 2-12 intact, Pain and light touch intact in extremities, Motor exam as listed above.   Non-Invasive Vascular Imaging CAROTID DUPLEX 10/21/2013   CEREBROVASCULAR DUPLEX EVALUATION    INDICATION: Carotid artery disease     PREVIOUS INTERVENTION(S): Right carotid endarterectomy 05/22/2005.    DUPLEX EXAM:     RIGHT  LEFT  Peak Systolic Velocities (cm/s) End Diastolic Velocities (cm/s) Plaque LOCATION Peak Systolic Velocities (cm/s) End Diastolic Velocities (cm/s) Plaque  79 11  CCA PROXIMAL 100 14   65 11  CCA MID 62 11   78 15  CCA DISTAL 63 12   107 7  ECA 86 7   70 17  ICA PROXIMAL 33 10 HT  55 14  ICA MID 75 18   46 14  ICA DISTAL 64 15      ICA / CCA Ratio (PSV) 1.20  Antegrade  Vertebral Flow Antegrade   160 Brachial Systolic Pressure (mmHg) 737  Triphasic  Brachial Artery Waveforms Triphasic     Plaque Morphology:  HM = Homogeneous, HT = Heterogeneous, CP = Calcific Plaque, SP = Smooth Plaque, IP = Irregular Plaque     ADDITIONAL FINDINGS:     IMPRESSION: Patent right carotid endarterectomy site with no evidence of hyperplasia or restenosis.  Left internal carotid artery velocities suggest a <40% stenosis.     Compared to the previous exam:  No significant change in comparison to the last exam on 10/09/2012.    Assessment: Lori Price is a 80 y.o. female who is s/p right carotid endarterectomy on 05/22/2005. Pt denies any remote or recent history of stroke or TIA. Patent right carotid endarterectomy site with no evidence of hyperplasia or restenosis.  Left internal carotid artery velocities suggest a <40% stenosis. No significant change in comparison to the last exam on 10/09/2012.   Plan: Follow-up in 1 year with Carotid Duplex scan.   I discussed in depth with the patient the nature of atherosclerosis, and emphasized the importance of maximal medical management including  strict control of blood pressure, blood glucose, and lipid levels, obtaining regular exercise, and continued cessation of smoking.  The patient is aware that without maximal medical management the underlying atherosclerotic disease process will progress, limiting the benefit of any interventions. The patient was given information about stroke prevention and what symptoms should prompt the patient to seek immediate medical care. Thank you for allowing [place] to participate in this patient's care.  Clemon Chambers, RN, MSN, FNP-C [facility] Office: [phone]  Clinic Physician: Oneida Alar  10/21/2013 3:37 PM

## 2013-10-22 NOTE — Addendum Note (Signed)
Addended by: Dorthula Rue L on: 10/22/2013 10:05 AM   Modules accepted: Orders

## 2013-10-26 DIAGNOSIS — Z5189 Encounter for other specified aftercare: Secondary | ICD-10-CM | POA: Diagnosis not present

## 2013-10-26 DIAGNOSIS — Z96619 Presence of unspecified artificial shoulder joint: Secondary | ICD-10-CM | POA: Diagnosis not present

## 2013-10-26 DIAGNOSIS — I2510 Atherosclerotic heart disease of native coronary artery without angina pectoris: Secondary | ICD-10-CM | POA: Diagnosis not present

## 2013-10-26 DIAGNOSIS — S42309D Unspecified fracture of shaft of humerus, unspecified arm, subsequent encounter for fracture with routine healing: Secondary | ICD-10-CM | POA: Diagnosis not present

## 2013-10-26 DIAGNOSIS — J45909 Unspecified asthma, uncomplicated: Secondary | ICD-10-CM | POA: Diagnosis not present

## 2013-10-26 DIAGNOSIS — M25569 Pain in unspecified knee: Secondary | ICD-10-CM | POA: Diagnosis not present

## 2013-10-28 DIAGNOSIS — J45909 Unspecified asthma, uncomplicated: Secondary | ICD-10-CM | POA: Diagnosis not present

## 2013-10-28 DIAGNOSIS — I2510 Atherosclerotic heart disease of native coronary artery without angina pectoris: Secondary | ICD-10-CM | POA: Diagnosis not present

## 2013-10-28 DIAGNOSIS — M25569 Pain in unspecified knee: Secondary | ICD-10-CM | POA: Diagnosis not present

## 2013-10-28 DIAGNOSIS — S42309D Unspecified fracture of shaft of humerus, unspecified arm, subsequent encounter for fracture with routine healing: Secondary | ICD-10-CM | POA: Diagnosis not present

## 2013-10-28 DIAGNOSIS — Z96619 Presence of unspecified artificial shoulder joint: Secondary | ICD-10-CM | POA: Diagnosis not present

## 2013-10-28 DIAGNOSIS — Z5189 Encounter for other specified aftercare: Secondary | ICD-10-CM | POA: Diagnosis not present

## 2013-11-08 ENCOUNTER — Ambulatory Visit: Payer: Medicare Other | Attending: Orthopedic Surgery | Admitting: Physical Therapy

## 2013-11-08 DIAGNOSIS — I10 Essential (primary) hypertension: Secondary | ICD-10-CM | POA: Diagnosis not present

## 2013-11-08 DIAGNOSIS — Z96619 Presence of unspecified artificial shoulder joint: Secondary | ICD-10-CM | POA: Diagnosis not present

## 2013-11-08 DIAGNOSIS — M25619 Stiffness of unspecified shoulder, not elsewhere classified: Secondary | ICD-10-CM | POA: Insufficient documentation

## 2013-11-08 DIAGNOSIS — M25519 Pain in unspecified shoulder: Secondary | ICD-10-CM | POA: Diagnosis not present

## 2013-11-08 DIAGNOSIS — IMO0001 Reserved for inherently not codable concepts without codable children: Secondary | ICD-10-CM | POA: Insufficient documentation

## 2013-11-08 DIAGNOSIS — R5381 Other malaise: Secondary | ICD-10-CM | POA: Insufficient documentation

## 2013-11-10 ENCOUNTER — Ambulatory Visit: Payer: Medicare Other | Admitting: Physical Therapy

## 2013-11-10 DIAGNOSIS — IMO0001 Reserved for inherently not codable concepts without codable children: Secondary | ICD-10-CM | POA: Diagnosis not present

## 2013-11-10 DIAGNOSIS — Z96619 Presence of unspecified artificial shoulder joint: Secondary | ICD-10-CM | POA: Diagnosis not present

## 2013-11-12 ENCOUNTER — Ambulatory Visit: Payer: Medicare Other | Admitting: Physical Therapy

## 2013-11-12 DIAGNOSIS — IMO0001 Reserved for inherently not codable concepts without codable children: Secondary | ICD-10-CM | POA: Diagnosis not present

## 2013-11-15 ENCOUNTER — Ambulatory Visit: Payer: Medicare Other | Admitting: *Deleted

## 2013-11-15 DIAGNOSIS — IMO0001 Reserved for inherently not codable concepts without codable children: Secondary | ICD-10-CM | POA: Diagnosis not present

## 2013-11-17 ENCOUNTER — Ambulatory Visit: Payer: Medicare Other | Attending: Orthopedic Surgery | Admitting: Physical Therapy

## 2013-11-17 DIAGNOSIS — Z96619 Presence of unspecified artificial shoulder joint: Secondary | ICD-10-CM | POA: Diagnosis not present

## 2013-11-17 DIAGNOSIS — M25619 Stiffness of unspecified shoulder, not elsewhere classified: Secondary | ICD-10-CM | POA: Diagnosis not present

## 2013-11-17 DIAGNOSIS — I10 Essential (primary) hypertension: Secondary | ICD-10-CM | POA: Insufficient documentation

## 2013-11-17 DIAGNOSIS — IMO0001 Reserved for inherently not codable concepts without codable children: Secondary | ICD-10-CM | POA: Insufficient documentation

## 2013-11-17 DIAGNOSIS — M25519 Pain in unspecified shoulder: Secondary | ICD-10-CM | POA: Insufficient documentation

## 2013-11-17 DIAGNOSIS — R5381 Other malaise: Secondary | ICD-10-CM | POA: Diagnosis not present

## 2013-11-23 ENCOUNTER — Ambulatory Visit: Payer: Medicare Other | Admitting: Physical Therapy

## 2013-11-23 DIAGNOSIS — IMO0001 Reserved for inherently not codable concepts without codable children: Secondary | ICD-10-CM | POA: Diagnosis not present

## 2013-11-25 ENCOUNTER — Ambulatory Visit: Payer: Medicare Other | Admitting: Physical Therapy

## 2013-11-25 DIAGNOSIS — IMO0001 Reserved for inherently not codable concepts without codable children: Secondary | ICD-10-CM | POA: Diagnosis not present

## 2013-11-30 ENCOUNTER — Ambulatory Visit: Payer: Medicare Other | Admitting: Physical Therapy

## 2013-11-30 DIAGNOSIS — IMO0001 Reserved for inherently not codable concepts without codable children: Secondary | ICD-10-CM | POA: Diagnosis not present

## 2013-11-30 DIAGNOSIS — M25569 Pain in unspecified knee: Secondary | ICD-10-CM | POA: Diagnosis not present

## 2013-12-01 ENCOUNTER — Encounter: Payer: Self-pay | Admitting: Cardiology

## 2013-12-01 ENCOUNTER — Encounter: Payer: Self-pay | Admitting: *Deleted

## 2013-12-01 ENCOUNTER — Ambulatory Visit (INDEPENDENT_AMBULATORY_CARE_PROVIDER_SITE_OTHER): Payer: Medicare Other | Admitting: Cardiology

## 2013-12-01 VITALS — BP 140/68 | HR 57 | Ht 66.0 in | Wt 223.0 lb

## 2013-12-01 DIAGNOSIS — I6529 Occlusion and stenosis of unspecified carotid artery: Secondary | ICD-10-CM

## 2013-12-01 DIAGNOSIS — I2510 Atherosclerotic heart disease of native coronary artery without angina pectoris: Secondary | ICD-10-CM | POA: Diagnosis not present

## 2013-12-01 DIAGNOSIS — I10 Essential (primary) hypertension: Secondary | ICD-10-CM | POA: Diagnosis not present

## 2013-12-01 MED ORDER — SIMVASTATIN 40 MG PO TABS: 40.0000 mg | ORAL_TABLET | ORAL | Status: DC

## 2013-12-01 MED ORDER — ATENOLOL 50 MG PO TABS: 50.0000 mg | ORAL_TABLET | Freq: Every day | ORAL | Status: DC

## 2013-12-01 NOTE — Progress Notes (Addendum)
HPI The patient presents for follow up of CAD/CABG.  Since I last saw her she has had no new cardiovascular complaints. Unfortunately she did fall and had to have right shoulder surgery. She's been more limited because of this.  The patient denies any new symptoms such as chest discomfort, neck or arm discomfort. There has been no new shortness of breath, PND or orthopnea. There have been no reported palpitations, presyncope or syncope.  Of note she did have a followup stress perfusion study last year which was low risk with possible anterior ischemia versus breast attenuation but preserved ejection fraction. She was managed medically.  Allergies  Allergen Reactions  . Oxycodone Hives and Itching  . Sulfonamide Derivatives Rash  . Ace Inhibitors     cough  . Codeine Hives and Itching  . Eggs Or Egg-Derived Products Hives, Diarrhea and Other (See Comments)    Pt stated "My esophagus swells, I get hives, diarrhea, and I pass out"  . Losartan Potassium     cough  . Quinapril     REACTION: cough  . Sibutramine     REACTION: palpitations    Current Outpatient Prescriptions  Medication Sig Dispense Refill  . acetaminophen (TYLENOL) 325 MG tablet Take 650 mg by mouth as needed.      Marland Kitchen aspirin 81 MG tablet Take 81 mg by mouth daily.        Marland Kitchen atenolol (TENORMIN) 50 MG tablet Take 1 tablet (50 mg total) by mouth daily.  90 tablet  3  . beclomethasone (QVAR) 40 MCG/ACT inhaler Inhale 1 puff into the lungs 2 (two) times daily as needed (for breathing difficulty during allergery season).       . bisacodyl (DULCOLAX) 5 MG EC tablet Take 5 mg by mouth daily as needed for moderate constipation.       . fexofenadine (ALLEGRA) 180 MG tablet Take 180 mg by mouth daily as needed for allergies.       . folic acid (FOLVITE) 607 MCG tablet Take 400 mcg by mouth daily.        [name] Aspart-Potassium Aspart (ASPARTATE MG & K) 90-90 MG CAPS Take 1 capsule by mouth 3 (three) times a week. On Mon, Wed, Fri       . Magnesium 250 MG TABS Take 250 mg by mouth 2 (two) times daily.       . Omega-3 Fatty Acids (FISH OIL) 1000 MG CAPS Take 1,000 mg by mouth daily.       . pantoprazole (PROTONIX) 40 MG tablet Take 40 mg by mouth as needed.       . simvastatin (ZOCOR) 40 MG tablet Take 40 mg by mouth 2 (two) times a week. On Sun and Wed      . triamterene-hydrochlorothiazide (DYAZIDE) 37.5-25 MG per capsule Take 1 capsule by mouth daily as needed (for fluid retension and swelling).      Marland Kitchen HYDROcodone-acetaminophen (NORCO) 5-325 MG per tablet Take 1-2 tablets by mouth every 4 (four) hours as needed for moderate pain.  60 tablet  0  . meclizine (ANTIVERT) 25 MG tablet Take 25 mg by mouth daily as needed for dizziness.       . methocarbamol (ROBAXIN) 500 MG tablet Take 1 tablet (500 mg total) by mouth 3 (three) times daily as needed for muscle spasms.  60 tablet  1   No current facility-administered medications for this visit.    Past Medical History  Diagnosis Date  . Coronary artery disease  s/p MI 2001--(catheterizations in 2001 with left main 70% stenosis, LAD 70% followed by 90% stenosis, circumflex 99% stenosis, right coronary artery occluded.   . Peripheral vascular disease   . Hyperlipidemia   . Vertigo   . Nephrolithiasis   . Chronic cough   . Asthma   . Complication of anesthesia     dental work  bp dropped  . Sleep apnea     CPAP   no  . Hypertension     dr Percival Spanish  . Carotid artery occlusion     Past Surgical History  Procedure Laterality Date  . Knee arthoscopy    . Coronary artery bypass graft      2001 (LIMA to LAD, SVG to obtuse marginal, sequential  SVG to RV , marginal branch in the distal right coronary artery.  . Tonsillectomy    . Right carotid endarterectomy    . Left carpal tunnel    . Carpal tunnel release Right   . Cardiac catheterization      2001  . Orif shoulder fracture Right 08/30/2013    Procedure: HEMI ARTHROPLASTY;  Surgeon: [name], MD;  Location:  [facility];  Service: Orthopedics;  Laterality: Right;  . Carotid endarterectomy Right     cea  Dr. Amedeo Plenty    ROS:  Right shoulder pain, left knee pain.   Otherwise as stated in the HPI and negative for all other systems.  PHYSICAL EXAM BP 140/68  Pulse 57  Ht 5\' 6"  (1.676 m)  Wt 223 lb (101.152 kg)  BMI 36.01 kg/m2 GENERAL:  Well appearing NECK:  No jugular venous distention, waveform within normal limits, carotid upstroke brisk and symmetric, bilateral bruits, no thyromegaly, CEA scar LYMPHATICS:  No cervical, inguinal adenopathy LUNGS:  Clear to auscultation bilaterally BACK:  No CVA tenderness CHEST:  Well healed sternotomy scar. HEART:  PMI not displaced or sustained,S1 and S2 within normal limits, no S3, no S4, no clicks, no rubs, mid peaking 3/6 systolic murmur loudest at the right upper sternal border. ABD:  Flat, positive bowel sounds normal in frequency in pitch, no bruits, no rebound, no guarding, no midline pulsatile mass, no hepatomegaly, no splenomegaly EXT:  2 plus pulses throughout, no edema, no cyanosis no clubbing, SVG right scar.  Right leg larger than left edema SKIN:  No rashes no nodules NEURO:  Cranial nerves II through XII grossly intact, motor grossly intact throughout PSYCH:  Cognitively intact, oriented to person place and time  EKG:  Sinus bradycardia. Rate 62. Premature atrial contraction, no acute ST-T wave changes. Poor anterior R wave progression.  12/01/2013  ASSESSMENT AND PLAN  CAD:  She has had no new symptoms since her stress perfusion study last year. No change in therapy or further evaluation is indicated.  HTN:  The blood pressure is at target. No change in medications is indicated. We will continue with therapeutic lifestyle changes (TLC).  CAROTID STENOSIS:  She is having this followed by vascular surgery.  DYSLIPIDEMIA:  Her LDL in October was 115. She's only been taking her Zocor twice a week because she thinks it causes muscle aches. She's  going to increase this to 3 times a week. She'll then get her lipids checked again in about 8 weeks. I would be happy to review this.  MURMUR:  This is more pronounced than previous. I suggest that there has been progression of aortic stenosis and I will followup with an echocardiogram.

## 2013-12-01 NOTE — Patient Instructions (Addendum)
Please increase your simvastatin to 3 times a week. Continue all other medications as listed.  Please have fasting blood work in 8 weeks. (lipid)  Your physician has requested that you have an echocardiogram. Echocardiography is a painless test that uses sound waves to create images of your heart. It provides your doctor with information about the size and shape of your heart and how well your heart's chambers and valves are working. This procedure takes approximately one hour. There are no restrictions for this procedure.  Follow up in 1 year with Dr Percival Spanish.  You will receive a letter in the mail 2 months before you are due.  Please call us when you receive this letter to schedule your follow up appointment.

## 2013-12-02 ENCOUNTER — Ambulatory Visit: Payer: Medicare Other | Admitting: Physical Therapy

## 2013-12-02 DIAGNOSIS — IMO0001 Reserved for inherently not codable concepts without codable children: Secondary | ICD-10-CM | POA: Diagnosis not present

## 2013-12-14 ENCOUNTER — Ambulatory Visit (HOSPITAL_COMMUNITY): Payer: Medicare Other | Attending: Internal Medicine | Admitting: Radiology

## 2013-12-14 ENCOUNTER — Ambulatory Visit: Payer: Medicare Other | Admitting: Physical Therapy

## 2013-12-14 DIAGNOSIS — I739 Peripheral vascular disease, unspecified: Secondary | ICD-10-CM | POA: Diagnosis not present

## 2013-12-14 DIAGNOSIS — G4733 Obstructive sleep apnea (adult) (pediatric): Secondary | ICD-10-CM | POA: Insufficient documentation

## 2013-12-14 DIAGNOSIS — I252 Old myocardial infarction: Secondary | ICD-10-CM | POA: Insufficient documentation

## 2013-12-14 DIAGNOSIS — I10 Essential (primary) hypertension: Secondary | ICD-10-CM | POA: Insufficient documentation

## 2013-12-14 DIAGNOSIS — I6529 Occlusion and stenosis of unspecified carotid artery: Secondary | ICD-10-CM

## 2013-12-14 DIAGNOSIS — IMO0001 Reserved for inherently not codable concepts without codable children: Secondary | ICD-10-CM | POA: Diagnosis not present

## 2013-12-14 DIAGNOSIS — I2510 Atherosclerotic heart disease of native coronary artery without angina pectoris: Secondary | ICD-10-CM | POA: Diagnosis not present

## 2013-12-14 NOTE — Progress Notes (Signed)
Echocardiogram performed.  

## 2013-12-16 ENCOUNTER — Ambulatory Visit: Payer: Medicare Other | Admitting: Physical Therapy

## 2013-12-16 DIAGNOSIS — IMO0001 Reserved for inherently not codable concepts without codable children: Secondary | ICD-10-CM | POA: Diagnosis not present

## 2013-12-21 ENCOUNTER — Ambulatory Visit: Payer: Medicare Other | Attending: Orthopedic Surgery | Admitting: Physical Therapy

## 2013-12-21 DIAGNOSIS — M25619 Stiffness of unspecified shoulder, not elsewhere classified: Secondary | ICD-10-CM | POA: Insufficient documentation

## 2013-12-21 DIAGNOSIS — Z96619 Presence of unspecified artificial shoulder joint: Secondary | ICD-10-CM | POA: Insufficient documentation

## 2013-12-21 DIAGNOSIS — IMO0001 Reserved for inherently not codable concepts without codable children: Secondary | ICD-10-CM | POA: Diagnosis not present

## 2013-12-21 DIAGNOSIS — I10 Essential (primary) hypertension: Secondary | ICD-10-CM | POA: Insufficient documentation

## 2013-12-21 DIAGNOSIS — R5381 Other malaise: Secondary | ICD-10-CM | POA: Insufficient documentation

## 2013-12-21 DIAGNOSIS — M25519 Pain in unspecified shoulder: Secondary | ICD-10-CM | POA: Diagnosis not present

## 2013-12-22 DIAGNOSIS — Z96619 Presence of unspecified artificial shoulder joint: Secondary | ICD-10-CM | POA: Diagnosis not present

## 2013-12-23 ENCOUNTER — Ambulatory Visit: Payer: Medicare Other | Admitting: *Deleted

## 2013-12-23 DIAGNOSIS — IMO0001 Reserved for inherently not codable concepts without codable children: Secondary | ICD-10-CM | POA: Diagnosis not present

## 2013-12-28 ENCOUNTER — Ambulatory Visit: Payer: Medicare Other | Admitting: Physical Therapy

## 2013-12-28 DIAGNOSIS — IMO0001 Reserved for inherently not codable concepts without codable children: Secondary | ICD-10-CM | POA: Diagnosis not present

## 2013-12-30 ENCOUNTER — Ambulatory Visit: Payer: Medicare Other | Admitting: Physical Therapy

## 2013-12-30 DIAGNOSIS — IMO0001 Reserved for inherently not codable concepts without codable children: Secondary | ICD-10-CM | POA: Diagnosis not present

## 2014-01-04 ENCOUNTER — Ambulatory Visit: Payer: Medicare Other | Admitting: *Deleted

## 2014-01-04 DIAGNOSIS — IMO0001 Reserved for inherently not codable concepts without codable children: Secondary | ICD-10-CM | POA: Diagnosis not present

## 2014-01-05 DIAGNOSIS — L819 Disorder of pigmentation, unspecified: Secondary | ICD-10-CM | POA: Diagnosis not present

## 2014-01-05 DIAGNOSIS — L821 Other seborrheic keratosis: Secondary | ICD-10-CM | POA: Diagnosis not present

## 2014-01-05 DIAGNOSIS — L820 Inflamed seborrheic keratosis: Secondary | ICD-10-CM | POA: Diagnosis not present

## 2014-01-05 DIAGNOSIS — D485 Neoplasm of uncertain behavior of skin: Secondary | ICD-10-CM | POA: Diagnosis not present

## 2014-01-06 ENCOUNTER — Ambulatory Visit: Payer: Medicare Other | Admitting: *Deleted

## 2014-01-06 DIAGNOSIS — IMO0001 Reserved for inherently not codable concepts without codable children: Secondary | ICD-10-CM | POA: Diagnosis not present

## 2014-01-11 ENCOUNTER — Ambulatory Visit: Payer: Medicare Other | Admitting: Physical Therapy

## 2014-01-11 DIAGNOSIS — IMO0001 Reserved for inherently not codable concepts without codable children: Secondary | ICD-10-CM | POA: Diagnosis not present

## 2014-01-13 ENCOUNTER — Ambulatory Visit: Payer: Medicare Other | Admitting: Physical Therapy

## 2014-01-13 DIAGNOSIS — IMO0001 Reserved for inherently not codable concepts without codable children: Secondary | ICD-10-CM | POA: Diagnosis not present

## 2014-01-18 ENCOUNTER — Ambulatory Visit: Payer: Medicare Other | Attending: Orthopedic Surgery | Admitting: *Deleted

## 2014-01-18 DIAGNOSIS — M25619 Stiffness of unspecified shoulder, not elsewhere classified: Secondary | ICD-10-CM | POA: Insufficient documentation

## 2014-01-18 DIAGNOSIS — Z96619 Presence of unspecified artificial shoulder joint: Secondary | ICD-10-CM | POA: Diagnosis not present

## 2014-01-18 DIAGNOSIS — IMO0001 Reserved for inherently not codable concepts without codable children: Secondary | ICD-10-CM | POA: Diagnosis not present

## 2014-01-18 DIAGNOSIS — M25519 Pain in unspecified shoulder: Secondary | ICD-10-CM | POA: Insufficient documentation

## 2014-01-18 DIAGNOSIS — I10 Essential (primary) hypertension: Secondary | ICD-10-CM | POA: Diagnosis not present

## 2014-01-18 DIAGNOSIS — R5381 Other malaise: Secondary | ICD-10-CM | POA: Insufficient documentation

## 2014-01-20 ENCOUNTER — Ambulatory Visit: Payer: Medicare Other | Admitting: *Deleted

## 2014-01-20 DIAGNOSIS — R5381 Other malaise: Secondary | ICD-10-CM | POA: Diagnosis not present

## 2014-01-20 DIAGNOSIS — Z96619 Presence of unspecified artificial shoulder joint: Secondary | ICD-10-CM | POA: Diagnosis not present

## 2014-01-20 DIAGNOSIS — I10 Essential (primary) hypertension: Secondary | ICD-10-CM | POA: Diagnosis not present

## 2014-01-20 DIAGNOSIS — M25519 Pain in unspecified shoulder: Secondary | ICD-10-CM | POA: Diagnosis not present

## 2014-01-20 DIAGNOSIS — M25619 Stiffness of unspecified shoulder, not elsewhere classified: Secondary | ICD-10-CM | POA: Diagnosis not present

## 2014-01-20 DIAGNOSIS — IMO0001 Reserved for inherently not codable concepts without codable children: Secondary | ICD-10-CM | POA: Diagnosis not present

## 2014-01-25 ENCOUNTER — Ambulatory Visit: Payer: Medicare Other | Admitting: Physical Therapy

## 2014-01-25 DIAGNOSIS — I10 Essential (primary) hypertension: Secondary | ICD-10-CM | POA: Diagnosis not present

## 2014-01-25 DIAGNOSIS — R5381 Other malaise: Secondary | ICD-10-CM | POA: Diagnosis not present

## 2014-01-25 DIAGNOSIS — M25619 Stiffness of unspecified shoulder, not elsewhere classified: Secondary | ICD-10-CM | POA: Diagnosis not present

## 2014-01-25 DIAGNOSIS — IMO0001 Reserved for inherently not codable concepts without codable children: Secondary | ICD-10-CM | POA: Diagnosis not present

## 2014-01-25 DIAGNOSIS — Z96619 Presence of unspecified artificial shoulder joint: Secondary | ICD-10-CM | POA: Diagnosis not present

## 2014-01-25 DIAGNOSIS — M25519 Pain in unspecified shoulder: Secondary | ICD-10-CM | POA: Diagnosis not present

## 2014-01-27 ENCOUNTER — Ambulatory Visit: Payer: Medicare Other | Admitting: Physical Therapy

## 2014-01-27 DIAGNOSIS — I10 Essential (primary) hypertension: Secondary | ICD-10-CM | POA: Diagnosis not present

## 2014-01-27 DIAGNOSIS — R5381 Other malaise: Secondary | ICD-10-CM | POA: Diagnosis not present

## 2014-01-27 DIAGNOSIS — M25619 Stiffness of unspecified shoulder, not elsewhere classified: Secondary | ICD-10-CM | POA: Diagnosis not present

## 2014-01-27 DIAGNOSIS — Z96619 Presence of unspecified artificial shoulder joint: Secondary | ICD-10-CM | POA: Diagnosis not present

## 2014-01-27 DIAGNOSIS — IMO0001 Reserved for inherently not codable concepts without codable children: Secondary | ICD-10-CM | POA: Diagnosis not present

## 2014-01-27 DIAGNOSIS — M25519 Pain in unspecified shoulder: Secondary | ICD-10-CM | POA: Diagnosis not present

## 2014-02-01 ENCOUNTER — Ambulatory Visit: Payer: Medicare Other | Admitting: *Deleted

## 2014-02-01 DIAGNOSIS — M25519 Pain in unspecified shoulder: Secondary | ICD-10-CM | POA: Diagnosis not present

## 2014-02-01 DIAGNOSIS — R5381 Other malaise: Secondary | ICD-10-CM | POA: Diagnosis not present

## 2014-02-01 DIAGNOSIS — Z96619 Presence of unspecified artificial shoulder joint: Secondary | ICD-10-CM | POA: Diagnosis not present

## 2014-02-01 DIAGNOSIS — M25619 Stiffness of unspecified shoulder, not elsewhere classified: Secondary | ICD-10-CM | POA: Diagnosis not present

## 2014-02-01 DIAGNOSIS — I10 Essential (primary) hypertension: Secondary | ICD-10-CM | POA: Diagnosis not present

## 2014-02-01 DIAGNOSIS — IMO0001 Reserved for inherently not codable concepts without codable children: Secondary | ICD-10-CM | POA: Diagnosis not present

## 2014-02-03 ENCOUNTER — Ambulatory Visit: Payer: Medicare Other | Admitting: *Deleted

## 2014-02-03 ENCOUNTER — Ambulatory Visit (INDEPENDENT_AMBULATORY_CARE_PROVIDER_SITE_OTHER): Payer: Medicare Other | Admitting: *Deleted

## 2014-02-03 DIAGNOSIS — IMO0001 Reserved for inherently not codable concepts without codable children: Secondary | ICD-10-CM | POA: Diagnosis not present

## 2014-02-03 DIAGNOSIS — M25519 Pain in unspecified shoulder: Secondary | ICD-10-CM | POA: Diagnosis not present

## 2014-02-03 DIAGNOSIS — E785 Hyperlipidemia, unspecified: Secondary | ICD-10-CM | POA: Diagnosis not present

## 2014-02-03 DIAGNOSIS — R5381 Other malaise: Secondary | ICD-10-CM | POA: Diagnosis not present

## 2014-02-03 DIAGNOSIS — M25619 Stiffness of unspecified shoulder, not elsewhere classified: Secondary | ICD-10-CM | POA: Diagnosis not present

## 2014-02-03 DIAGNOSIS — Z96619 Presence of unspecified artificial shoulder joint: Secondary | ICD-10-CM | POA: Diagnosis not present

## 2014-02-03 DIAGNOSIS — I10 Essential (primary) hypertension: Secondary | ICD-10-CM | POA: Diagnosis not present

## 2014-02-03 LAB — LDL CHOLESTEROL, DIRECT: LDL DIRECT: 111.8 mg/dL

## 2014-02-03 LAB — LIPID PANEL
Cholesterol: 191 mg/dL (ref 0–200)
HDL: 34.1 mg/dL — ABNORMAL LOW (ref >39.00)
NONHDL: 156.9
TRIGLYCERIDES: 265 mg/dL — AB (ref 0.0–149.0)
Total CHOL/HDL Ratio: 6
VLDL: 53 mg/dL — ABNORMAL HIGH (ref 0.0–40.0)

## 2014-02-08 DIAGNOSIS — Z96619 Presence of unspecified artificial shoulder joint: Secondary | ICD-10-CM | POA: Diagnosis not present

## 2014-02-15 ENCOUNTER — Ambulatory Visit: Payer: Medicare Other | Admitting: Physical Therapy

## 2014-02-15 DIAGNOSIS — R5381 Other malaise: Secondary | ICD-10-CM | POA: Diagnosis not present

## 2014-02-15 DIAGNOSIS — M25519 Pain in unspecified shoulder: Secondary | ICD-10-CM | POA: Diagnosis not present

## 2014-02-15 DIAGNOSIS — Z96619 Presence of unspecified artificial shoulder joint: Secondary | ICD-10-CM | POA: Diagnosis not present

## 2014-02-15 DIAGNOSIS — IMO0001 Reserved for inherently not codable concepts without codable children: Secondary | ICD-10-CM | POA: Diagnosis not present

## 2014-02-15 DIAGNOSIS — I10 Essential (primary) hypertension: Secondary | ICD-10-CM | POA: Diagnosis not present

## 2014-02-15 DIAGNOSIS — M25619 Stiffness of unspecified shoulder, not elsewhere classified: Secondary | ICD-10-CM | POA: Diagnosis not present

## 2014-02-17 ENCOUNTER — Ambulatory Visit: Payer: Medicare Other | Attending: Orthopedic Surgery | Admitting: *Deleted

## 2014-02-17 DIAGNOSIS — M25511 Pain in right shoulder: Secondary | ICD-10-CM | POA: Insufficient documentation

## 2014-02-17 DIAGNOSIS — Z96611 Presence of right artificial shoulder joint: Secondary | ICD-10-CM | POA: Insufficient documentation

## 2014-02-17 DIAGNOSIS — I10 Essential (primary) hypertension: Secondary | ICD-10-CM | POA: Insufficient documentation

## 2014-02-17 DIAGNOSIS — M810 Age-related osteoporosis without current pathological fracture: Secondary | ICD-10-CM | POA: Diagnosis not present

## 2014-02-17 DIAGNOSIS — R5381 Other malaise: Secondary | ICD-10-CM | POA: Diagnosis not present

## 2014-02-17 DIAGNOSIS — M25611 Stiffness of right shoulder, not elsewhere classified: Secondary | ICD-10-CM | POA: Insufficient documentation

## 2014-02-22 ENCOUNTER — Ambulatory Visit: Payer: Medicare Other | Admitting: *Deleted

## 2014-02-22 DIAGNOSIS — M25611 Stiffness of right shoulder, not elsewhere classified: Secondary | ICD-10-CM | POA: Diagnosis not present

## 2014-02-22 DIAGNOSIS — R5381 Other malaise: Secondary | ICD-10-CM | POA: Diagnosis not present

## 2014-02-22 DIAGNOSIS — I10 Essential (primary) hypertension: Secondary | ICD-10-CM | POA: Diagnosis not present

## 2014-02-22 DIAGNOSIS — M810 Age-related osteoporosis without current pathological fracture: Secondary | ICD-10-CM | POA: Diagnosis not present

## 2014-02-22 DIAGNOSIS — M25511 Pain in right shoulder: Secondary | ICD-10-CM | POA: Diagnosis not present

## 2014-02-22 DIAGNOSIS — Z96611 Presence of right artificial shoulder joint: Secondary | ICD-10-CM | POA: Diagnosis not present

## 2014-02-24 ENCOUNTER — Ambulatory Visit: Payer: Medicare Other | Admitting: *Deleted

## 2014-02-24 DIAGNOSIS — I10 Essential (primary) hypertension: Secondary | ICD-10-CM | POA: Diagnosis not present

## 2014-02-24 DIAGNOSIS — R5381 Other malaise: Secondary | ICD-10-CM | POA: Diagnosis not present

## 2014-02-24 DIAGNOSIS — Z96611 Presence of right artificial shoulder joint: Secondary | ICD-10-CM | POA: Diagnosis not present

## 2014-02-24 DIAGNOSIS — M25611 Stiffness of right shoulder, not elsewhere classified: Secondary | ICD-10-CM | POA: Diagnosis not present

## 2014-02-24 DIAGNOSIS — M25511 Pain in right shoulder: Secondary | ICD-10-CM | POA: Diagnosis not present

## 2014-02-24 DIAGNOSIS — M810 Age-related osteoporosis without current pathological fracture: Secondary | ICD-10-CM | POA: Diagnosis not present

## 2014-03-01 ENCOUNTER — Ambulatory Visit: Payer: Medicare Other | Admitting: *Deleted

## 2014-03-01 DIAGNOSIS — R5381 Other malaise: Secondary | ICD-10-CM | POA: Diagnosis not present

## 2014-03-01 DIAGNOSIS — M810 Age-related osteoporosis without current pathological fracture: Secondary | ICD-10-CM | POA: Diagnosis not present

## 2014-03-01 DIAGNOSIS — M25611 Stiffness of right shoulder, not elsewhere classified: Secondary | ICD-10-CM | POA: Diagnosis not present

## 2014-03-01 DIAGNOSIS — I10 Essential (primary) hypertension: Secondary | ICD-10-CM | POA: Diagnosis not present

## 2014-03-01 DIAGNOSIS — Z96611 Presence of right artificial shoulder joint: Secondary | ICD-10-CM | POA: Diagnosis not present

## 2014-03-01 DIAGNOSIS — M25511 Pain in right shoulder: Secondary | ICD-10-CM | POA: Diagnosis not present

## 2014-03-08 ENCOUNTER — Ambulatory Visit: Payer: Medicare Other | Admitting: *Deleted

## 2014-03-08 DIAGNOSIS — R5381 Other malaise: Secondary | ICD-10-CM | POA: Diagnosis not present

## 2014-03-08 DIAGNOSIS — I10 Essential (primary) hypertension: Secondary | ICD-10-CM | POA: Diagnosis not present

## 2014-03-08 DIAGNOSIS — M810 Age-related osteoporosis without current pathological fracture: Secondary | ICD-10-CM | POA: Diagnosis not present

## 2014-03-08 DIAGNOSIS — M25511 Pain in right shoulder: Secondary | ICD-10-CM | POA: Diagnosis not present

## 2014-03-08 DIAGNOSIS — Z96611 Presence of right artificial shoulder joint: Secondary | ICD-10-CM | POA: Diagnosis not present

## 2014-03-08 DIAGNOSIS — M25611 Stiffness of right shoulder, not elsewhere classified: Secondary | ICD-10-CM | POA: Diagnosis not present

## 2014-03-15 ENCOUNTER — Ambulatory Visit: Payer: Medicare Other | Admitting: *Deleted

## 2014-03-15 DIAGNOSIS — R5381 Other malaise: Secondary | ICD-10-CM | POA: Diagnosis not present

## 2014-03-15 DIAGNOSIS — M810 Age-related osteoporosis without current pathological fracture: Secondary | ICD-10-CM | POA: Diagnosis not present

## 2014-03-15 DIAGNOSIS — M25611 Stiffness of right shoulder, not elsewhere classified: Secondary | ICD-10-CM | POA: Diagnosis not present

## 2014-03-15 DIAGNOSIS — M25511 Pain in right shoulder: Secondary | ICD-10-CM | POA: Diagnosis not present

## 2014-03-15 DIAGNOSIS — I10 Essential (primary) hypertension: Secondary | ICD-10-CM | POA: Diagnosis not present

## 2014-03-15 DIAGNOSIS — Z96611 Presence of right artificial shoulder joint: Secondary | ICD-10-CM | POA: Diagnosis not present

## 2014-03-17 DIAGNOSIS — Z1211 Encounter for screening for malignant neoplasm of colon: Secondary | ICD-10-CM | POA: Diagnosis not present

## 2014-03-17 DIAGNOSIS — R5383 Other fatigue: Secondary | ICD-10-CM | POA: Diagnosis not present

## 2014-03-17 DIAGNOSIS — E780 Pure hypercholesterolemia: Secondary | ICD-10-CM | POA: Diagnosis not present

## 2014-03-17 DIAGNOSIS — Z79899 Other long term (current) drug therapy: Secondary | ICD-10-CM | POA: Diagnosis not present

## 2014-03-17 DIAGNOSIS — Z0000 Encounter for general adult medical examination without abnormal findings: Secondary | ICD-10-CM | POA: Diagnosis not present

## 2014-03-22 ENCOUNTER — Ambulatory Visit: Payer: Medicare Other | Attending: Orthopedic Surgery | Admitting: *Deleted

## 2014-03-22 DIAGNOSIS — Z96611 Presence of right artificial shoulder joint: Secondary | ICD-10-CM | POA: Diagnosis not present

## 2014-03-22 DIAGNOSIS — R5381 Other malaise: Secondary | ICD-10-CM | POA: Diagnosis not present

## 2014-03-22 DIAGNOSIS — M25511 Pain in right shoulder: Secondary | ICD-10-CM | POA: Insufficient documentation

## 2014-03-22 DIAGNOSIS — M810 Age-related osteoporosis without current pathological fracture: Secondary | ICD-10-CM | POA: Diagnosis not present

## 2014-03-22 DIAGNOSIS — M25611 Stiffness of right shoulder, not elsewhere classified: Secondary | ICD-10-CM | POA: Insufficient documentation

## 2014-03-22 DIAGNOSIS — I10 Essential (primary) hypertension: Secondary | ICD-10-CM | POA: Diagnosis not present

## 2014-04-05 DIAGNOSIS — Z96611 Presence of right artificial shoulder joint: Secondary | ICD-10-CM | POA: Diagnosis not present

## 2014-09-07 DIAGNOSIS — S42201D Unspecified fracture of upper end of right humerus, subsequent encounter for fracture with routine healing: Secondary | ICD-10-CM | POA: Diagnosis not present

## 2014-09-07 DIAGNOSIS — Z471 Aftercare following joint replacement surgery: Secondary | ICD-10-CM | POA: Diagnosis not present

## 2014-09-07 DIAGNOSIS — Z96611 Presence of right artificial shoulder joint: Secondary | ICD-10-CM | POA: Diagnosis not present

## 2014-10-28 ENCOUNTER — Encounter: Payer: Self-pay | Admitting: Family

## 2014-11-01 ENCOUNTER — Ambulatory Visit (INDEPENDENT_AMBULATORY_CARE_PROVIDER_SITE_OTHER): Payer: Medicare Other | Admitting: Family

## 2014-11-01 ENCOUNTER — Ambulatory Visit (HOSPITAL_COMMUNITY)
Admission: RE | Admit: 2014-11-01 | Discharge: 2014-11-01 | Disposition: A | Discharge status: Home / Self Care | Payer: Medicare Other | Source: Ambulatory Visit | Attending: Family | Admitting: Family

## 2014-11-01 ENCOUNTER — Encounter: Payer: Self-pay | Admitting: Family

## 2014-11-01 VITALS — BP 134/71 | HR 73 | Resp 16 | Ht 67.0 in | Wt 242.0 lb

## 2014-11-01 DIAGNOSIS — Z48812 Encounter for surgical aftercare following surgery on the circulatory system: Secondary | ICD-10-CM | POA: Diagnosis not present

## 2014-11-01 DIAGNOSIS — I6523 Occlusion and stenosis of bilateral carotid arteries: Secondary | ICD-10-CM | POA: Diagnosis not present

## 2014-11-01 DIAGNOSIS — Z9889 Other specified postprocedural states: Secondary | ICD-10-CM | POA: Diagnosis not present

## 2014-11-01 NOTE — Patient Instructions (Signed)
Stroke Prevention Some medical conditions and behaviors are associated with an increased chance of having a stroke. You may prevent a stroke by making healthy choices and managing medical conditions. HOW CAN I REDUCE MY RISK OF HAVING A STROKE?   Stay physically active. Get at least 30 minutes of activity on most or all days.  Do not smoke. It may also be helpful to avoid exposure to secondhand smoke.  Limit alcohol use. Moderate alcohol use is considered to be:  No more than 2 drinks per day for men.  No more than 1 drink per day for nonpregnant women.  Eat healthy foods. This involves:  Eating 5 or more servings of fruits and vegetables a day.  Making dietary changes that address high blood pressure (hypertension), high cholesterol, diabetes, or obesity.  Manage your cholesterol levels.  Making food choices that are high in fiber and low in saturated fat, trans fat, and cholesterol may control cholesterol levels.  Take any prescribed medicines to control cholesterol as directed by your health care provider.  Manage your diabetes.  Controlling your carbohydrate and sugar intake is recommended to manage diabetes.  Take any prescribed medicines to control diabetes as directed by your health care provider.  Control your hypertension.  Making food choices that are low in salt (sodium), saturated fat, trans fat, and cholesterol is recommended to manage hypertension.  Take any prescribed medicines to control hypertension as directed by your health care provider.  Maintain a healthy weight.  Reducing calorie intake and making food choices that are low in sodium, saturated fat, trans fat, and cholesterol are recommended to manage weight.  Stop drug abuse.  Avoid taking birth control pills.  Talk to your health care provider about the risks of taking birth control pills if you are over 35 years old, smoke, get migraines, or have ever had a blood clot.  Get evaluated for sleep  disorders (sleep apnea).  Talk to your health care provider about getting a sleep evaluation if you snore a lot or have excessive sleepiness.  Take medicines only as directed by your health care provider.  For some people, aspirin or blood thinners (anticoagulants) are helpful in reducing the risk of forming abnormal blood clots that can lead to stroke. If you have the irregular heart rhythm of atrial fibrillation, you should be on a blood thinner unless there is a good reason you cannot take them.  Understand all your medicine instructions.  Make sure that other conditions (such as anemia or atherosclerosis) are addressed. SEEK IMMEDIATE MEDICAL CARE IF:   You have sudden weakness or numbness of the face, arm, or leg, especially on one side of the body.  Your face or eyelid droops to one side.  You have sudden confusion.  You have trouble speaking (aphasia) or understanding.  You have sudden trouble seeing in one or both eyes.  You have sudden trouble walking.  You have dizziness.  You have a loss of balance or coordination.  You have a sudden, severe headache with no known cause.  You have new chest pain or an irregular heartbeat. Any of these symptoms may represent a serious problem that is an emergency. Do not wait to see if the symptoms will go away. Get medical help at once. Call your local emergency services (911 in [place] Do not drive yourself to the hospital. Document Released: 06/13/2004 Document Revised: 09/20/2013 Document Reviewed: 11/06/2012 [employer] Patient Information 2015 [employer]. This information is not intended to replace advice given   to you by your health care provider. Make sure you discuss any questions you have with your health care provider.  

## 2014-11-01 NOTE — Progress Notes (Signed)
Established Carotid Patient   History of Present Illness  Lori Price is a 81 y.o. female patient of Dr. Scot Dock who is s/p right carotid endarterectomy on 05/22/2005 by Dr. Amedeo Plenty. She returns today for follow up. Pt denies any remote or recent history of stroke or TIA, but states she had a large MI in 2001, had 4 vessel CABG.  The patient denies amaurosis fugax or monocular blindness. The patient denies facial drooping. Pt. denies hemiplegia. The patient denies receptive or expressive aphasia. Pt. denies extremity weakness.  Pt reports New Medical or Surgical History: diagnosed with lumbar stenosis. Fell in 2015 and had right shoulder replacement in April, 2015.  She does not use ETOH, has "bad knees", states she needs left knee replacement.  Pt Diabetic: No Pt smoker: non-smoker  Pt meds include: Statin : Yes, takes a few times/week as daily use causes arthralgias ASA: Yes Other anticoagulants/antiplatelets: no   Past Medical History  Diagnosis Date  . Coronary artery disease     s/p MI 2001--(catheterizations in 2001 with left main 70% stenosis, LAD 70% followed by 90% stenosis, circumflex 99% stenosis, right coronary artery occluded.   . Peripheral vascular disease   . Hyperlipidemia   . Vertigo   . Nephrolithiasis   . Chronic cough   . Asthma   . Complication of anesthesia     dental work  bp dropped  . Sleep apnea     CPAP   no  . Hypertension     dr Percival Spanish  . Carotid artery occlusion     Social History History  Substance Use Topics  . Smoking status: Never Smoker   . Smokeless tobacco: Never Used  . Alcohol Use: No    Family History Family History  Problem Relation Age of Onset  . Ovarian cancer Mother   . Cancer Mother     Ovarian  . COPD Father   . Aneurysm Father     Throat  . Kidney disease Father   . Heart disease Maternal Grandmother   . Heart disease Maternal Grandfather     Surgical History Past Surgical History  Procedure  Laterality Date  . Knee arthoscopy    . Coronary artery bypass graft      2001 (LIMA to LAD, SVG to obtuse marginal, sequential  SVG to RV , marginal branch in the distal right coronary artery.  . Tonsillectomy    . Right carotid endarterectomy    . Left carpal tunnel    . Carpal tunnel release Right   . Cardiac catheterization      2001  . Orif shoulder fracture Right 08/30/2013    Procedure: HEMI ARTHROPLASTY;  Surgeon: [name], MD;  Location: [facility];  Service: Orthopedics;  Laterality: Right;  . Carotid endarterectomy Right     cea  Dr. Amedeo Plenty    Allergies  Allergen Reactions  . Oxycodone Hives and Itching  . Sibutramine Palpitations    REACTION: palpitations  . Sulfonamide Derivatives Rash  . Ace Inhibitors Cough    cough  . Codeine Hives and Itching  . Eggs Or Egg-Derived Products Hives, Diarrhea and Other (See Comments)    Pt stated "My esophagus swells, I get hives, diarrhea, and I pass out"  . Losartan Potassium     cough  . Quinapril     REACTION: cough    Current Outpatient Prescriptions  Medication Sig Dispense Refill  . acetaminophen (TYLENOL) 325 MG tablet Take 650 mg by mouth as needed.    Marland Kitchen  aspirin 81 MG tablet Take 81 mg by mouth daily.      Marland Kitchen atenolol (TENORMIN) 50 MG tablet Take 1 tablet (50 mg total) by mouth daily. 90 tablet 3  . bisacodyl (DULCOLAX) 5 MG EC tablet Take 5 mg by mouth daily as needed for moderate constipation.     . fexofenadine (ALLEGRA) 180 MG tablet Take 180 mg by mouth daily as needed for allergies.     . folic acid (FOLVITE) 867 MCG tablet Take 400 mcg by mouth daily.      [name] Aspart-Potassium Aspart (ASPARTATE MG & K) 90-90 MG CAPS Take 1 capsule by mouth 3 (three) times a week. On Mon, Wed, Fri    . Magnesium 250 MG TABS Take 250 mg by mouth 2 (two) times daily.     . meclizine (ANTIVERT) 25 MG tablet Take 25 mg by mouth daily as needed for dizziness.     . methocarbamol (ROBAXIN) 500 MG tablet Take 1 tablet (500 mg total)  by mouth 3 (three) times daily as needed for muscle spasms. 60 tablet 1  . Omega-3 Fatty Acids (FISH OIL) 1000 MG CAPS Take 1,000 mg by mouth daily.     . pantoprazole (PROTONIX) 40 MG tablet Take 40 mg by mouth as needed.     . simvastatin (ZOCOR) 40 MG tablet Take 1 tablet (40 mg total) by mouth 3 (three) times a week. 50 tablet 3  . triamterene-hydrochlorothiazide (DYAZIDE) 37.5-25 MG per capsule Take 1 capsule by mouth daily as needed (for fluid retension and swelling).    . beclomethasone (QVAR) 40 MCG/ACT inhaler Inhale 1 puff into the lungs 2 (two) times daily as needed (for breathing difficulty during allergery season).     Marland Kitchen HYDROcodone-acetaminophen (NORCO) 5-325 MG per tablet Take 1-2 tablets by mouth every 4 (four) hours as needed for moderate pain. (Patient not taking: Reported on 11/01/2014) 60 tablet 0   No current facility-administered medications for this visit.    Review of Systems : See HPI for pertinent positives and negatives.  Physical Examination  Filed Vitals:   11/01/14 1501 11/01/14 1505  BP: 139/83 134/71  Pulse: 71 73  Resp:  16  Height:  5\' 7"  (1.702 m)  Weight:  242 lb (109.77 kg)  SpO2:  93%   Body mass index is 37.89 kg/(m^2).  General: WDWN obese female in NAD GAIT: antalgic, with quad cane Eyes: PERRLA Pulmonary: Non-labored, CTAB, Negative Rales, Negative rhonchi, & Negative wheezing.  Cardiac: regular Rhythm,positive murmur.  VASCULAR EXAM Carotid Bruits Left Right   Negative Transmitted cardiac murmur   Radial pulses are 2+ palpable and equal.      LE Pulses LEFT RIGHT   POPLITEAL not palpable  not palpable   POSTERIOR TIBIAL not palpable  2+ palpable    DORSALIS PEDIS  ANTERIOR TIBIAL 2+ palpable  not palpable     Gastrointestinal: soft, nontender, BS WNL, no  r/g, no palpable masses.  Musculoskeletal: Negative muscle atrophy/wasting. M/S 5/5 throughout except 3/5 in right UE, Extremities without ischemic changes.  Neurologic: A&O X 3; Appropriate Affect, Speech is normal CN 2-12 intact, Pain and light touch intact in extremities, Motor exam as listed above.         Non-Invasive Vascular Imaging CAROTID DUPLEX 11/01/2014   CEREBROVASCULAR DUPLEX EVALUATION    INDICATION: Carotid artery disease    PREVIOUS INTERVENTION(S): Right carotid endarterectomy 05/22/2005    DUPLEX EXAM: Carotid duplex    RIGHT  LEFT  Peak Systolic Velocities (  cm/s) End Diastolic Velocities (cm/s) Plaque LOCATION Peak Systolic Velocities (cm/s) End Diastolic Velocities (cm/s) Plaque  161 16 - CCA PROXIMAL 194 19 -  109 19 - CCA MID 95 13 -  88 12 HT CCA DISTAL 90 13 HT  116 14 - ECA 93 9 -  106 20 HT ICA PROXIMAL 100 20 HT  66 13 - ICA MID 94 22 -  73 16 - ICA DISTAL 92 19 -    N/A ICA / CCA Ratio (PSV) 1.0  Antegrade Vertebral Flow Antegrade  338 Brachial Systolic Pressure (mmHg) 329  Triphasic Brachial Artery Waveforms Triphasic    Plaque Morphology:  HM = Homogeneous, HT = Heterogeneous, CP = Calcific Plaque, SP = Smooth Plaque, IP = Irregular Plaque  ADDITIONAL FINDINGS: Arrhythmias noted    IMPRESSION: 1. Patent right carotid endarterectomy site with no evidence for restenosis. 2. 1 - 49% left internal carotid artery stenosis.    Compared to the previous exam:  No significant change since exam of 10/21/2013      Assessment: Lori Price is a 81 y.o. female who is s/p right carotid endarterectomy on 05/22/2005. Pt denies any remote or recent history of stroke or TIA. No arrhythmia on exam, but arrhythmia was detected during Duplex.   Today's carotid Duplex suggests a patent right carotid endarterectomy site with no evidence for restenosis and 1 - 49% left internal carotid artery stenosis. No significant change since exam of  10/21/2013.   Plan: Follow-up in 1 year with Carotid Duplex.   I discussed in depth with the patient the nature of atherosclerosis, and emphasized the importance of maximal medical management including strict control of blood pressure, blood glucose, and lipid levels, obtaining regular exercise, and continued cessation of smoking.  The patient is aware that without maximal medical management the underlying atherosclerotic disease process will progress, limiting the benefit of any interventions. The patient was given information about stroke prevention and what symptoms should prompt the patient to seek immediate medical care. Thank you for allowing [place] to participate in this patient's care.  Clemon Chambers, RN, MSN, FNP-C [facility] Office: [phone]  Clinic Physician: Early  11/01/2014 3:22 PM

## 2014-11-02 NOTE — Addendum Note (Signed)
Addended by: Dorthula Rue L on: 11/02/2014 09:16 AM   Modules accepted: Orders

## 2014-11-14 ENCOUNTER — Other Ambulatory Visit: Payer: Self-pay

## 2015-04-05 ENCOUNTER — Other Ambulatory Visit: Payer: Self-pay | Admitting: Cardiology

## 2015-05-01 DIAGNOSIS — H2513 Age-related nuclear cataract, bilateral: Secondary | ICD-10-CM | POA: Diagnosis not present

## 2015-05-05 DIAGNOSIS — H903 Sensorineural hearing loss, bilateral: Secondary | ICD-10-CM | POA: Insufficient documentation

## 2015-10-30 ENCOUNTER — Encounter: Payer: Self-pay | Admitting: Family

## 2015-11-07 ENCOUNTER — Ambulatory Visit (HOSPITAL_COMMUNITY)
Admission: RE | Admit: 2015-11-07 | Discharge: 2015-11-07 | Disposition: A | Discharge status: Home / Self Care | Payer: Medicare Other | Source: Ambulatory Visit | Attending: Family | Admitting: Family

## 2015-11-07 ENCOUNTER — Ambulatory Visit (INDEPENDENT_AMBULATORY_CARE_PROVIDER_SITE_OTHER): Payer: Medicare Other | Admitting: Family

## 2015-11-07 ENCOUNTER — Encounter: Payer: Self-pay | Admitting: Family

## 2015-11-07 VITALS — BP 156/84 | HR 76 | Temp 97.9°F | Resp 16 | Ht 66.5 in | Wt 238.0 lb

## 2015-11-07 DIAGNOSIS — I6522 Occlusion and stenosis of left carotid artery: Secondary | ICD-10-CM | POA: Diagnosis not present

## 2015-11-07 DIAGNOSIS — Z48812 Encounter for surgical aftercare following surgery on the circulatory system: Secondary | ICD-10-CM

## 2015-11-07 DIAGNOSIS — Z9889 Other specified postprocedural states: Secondary | ICD-10-CM | POA: Diagnosis not present

## 2015-11-07 DIAGNOSIS — I6521 Occlusion and stenosis of right carotid artery: Secondary | ICD-10-CM | POA: Diagnosis not present

## 2015-11-07 NOTE — Patient Instructions (Signed)
Stroke Prevention Some medical conditions and behaviors are associated with an increased chance of having a stroke. You may prevent a stroke by making healthy choices and managing medical conditions. HOW CAN I REDUCE MY RISK OF HAVING A STROKE?   Stay physically active. Get at least 30 minutes of activity on most or all days.  Do not smoke. It may also be helpful to avoid exposure to secondhand smoke.  Limit alcohol use. Moderate alcohol use is considered to be:  No more than 2 drinks per day for men.  No more than 1 drink per day for nonpregnant women.  Eat healthy foods. This involves:  Eating 5 or more servings of fruits and vegetables a day.  Making dietary changes that address high blood pressure (hypertension), high cholesterol, diabetes, or obesity.  Manage your cholesterol levels.  Making food choices that are high in fiber and low in saturated fat, trans fat, and cholesterol may control cholesterol levels.  Take any prescribed medicines to control cholesterol as directed by your health care provider.  Manage your diabetes.  Controlling your carbohydrate and sugar intake is recommended to manage diabetes.  Take any prescribed medicines to control diabetes as directed by your health care provider.  Control your hypertension.  Making food choices that are low in salt (sodium), saturated fat, trans fat, and cholesterol is recommended to manage hypertension.  Ask your health care provider if you need treatment to lower your blood pressure. Take any prescribed medicines to control hypertension as directed by your health care provider.  If you are 18-39 years of age, have your blood pressure checked every 3-5 years. If you are 40 years of age or older, have your blood pressure checked every year.  Maintain a healthy weight.  Reducing calorie intake and making food choices that are low in sodium, saturated fat, trans fat, and cholesterol are recommended to manage  weight.  Stop drug abuse.  Avoid taking birth control pills.  Talk to your health care provider about the risks of taking birth control pills if you are over 35 years old, smoke, get migraines, or have ever had a blood clot.  Get evaluated for sleep disorders (sleep apnea).  Talk to your health care provider about getting a sleep evaluation if you snore a lot or have excessive sleepiness.  Take medicines only as directed by your health care provider.  For some people, aspirin or blood thinners (anticoagulants) are helpful in reducing the risk of forming abnormal blood clots that can lead to stroke. If you have the irregular heart rhythm of atrial fibrillation, you should be on a blood thinner unless there is a good reason you cannot take them.  Understand all your medicine instructions.  Make sure that other conditions (such as anemia or atherosclerosis) are addressed. SEEK IMMEDIATE MEDICAL CARE IF:   You have sudden weakness or numbness of the face, arm, or leg, especially on one side of the body.  Your face or eyelid droops to one side.  You have sudden confusion.  You have trouble speaking (aphasia) or understanding.  You have sudden trouble seeing in one or both eyes.  You have sudden trouble walking.  You have dizziness.  You have a loss of balance or coordination.  You have a sudden, severe headache with no known cause.  You have new chest pain or an irregular heartbeat. Any of these symptoms may represent a serious problem that is an emergency. Do not wait to see if the symptoms will   go away. Get medical help at once. Call your local emergency services (911 in [place] Do not drive yourself to the hospital.   This information is not intended to replace advice given to you by your health care provider. Make sure you discuss any questions you have with your health care provider.   Document Released: 06/13/2004 Document Revised: 05/27/2014 Document Reviewed:  11/06/2012 Elsevier Interactive Patient Education 2016 [employer]  

## 2015-11-07 NOTE — Progress Notes (Addendum)
Chief Complaint: Follow up Extracranial Carotid Artery Stenosis   History of Present Illness  Lori Price is a 82 y.o. female patient of Dr. Scot Dock who is s/p right carotid endarterectomy on 05/22/2005 by Dr. Amedeo Plenty. She returns today for follow up. Pt denies any remote or recent history of stroke or TIA, but states she had a large MI in 2001, had 4 vessel CABG. Pt states Dr. Percival Spanish is aware of her occasionally irregular heart rhythm.   The patient denies any history of TIA or stroke symptoms, specifically the patient denies a history of amaurosis fugax or monocular blindness, denies a history unilateral  of facial drooping, denies a history of hemiplegia, and denies a history of receptive or expressive aphasia.    Pt reports that she was diagnosed with lumbar stenosis. Fell in 2015 and had right shoulder replacement in April, 2015.  She does not use ETOH, has "bad knees", states she needs left knee replacement but decided not to have this done.  Pt Diabetic: No Pt smoker: non-smoker  Pt meds include: Statin : No, caused her to lose feeling in her feet; she has not told her cardiologist who prescribed this for her, but she did notify her PCP ASA: Yes Other anticoagulants/antiplatelets: no    Past Medical History  Diagnosis Date  . Coronary artery disease     s/p MI 2001--(catheterizations in 2001 with left main 70% stenosis, LAD 70% followed by 90% stenosis, circumflex 99% stenosis, right coronary artery occluded.   . Peripheral vascular disease ([place])   . Hyperlipidemia   . Vertigo   . Nephrolithiasis   . Chronic cough   . Asthma   . Complication of anesthesia     dental work  bp dropped  . Sleep apnea     CPAP   no  . Hypertension     dr Percival Spanish  . Carotid artery occlusion     Social History Social History  Substance Use Topics  . Smoking status: Never Smoker   . Smokeless tobacco: Never Used  . Alcohol Use: No    Family History Family History  Problem  Relation Age of Onset  . Ovarian cancer Mother   . Cancer Mother     Ovarian  . COPD Father   . Aneurysm Father     Throat  . Kidney disease Father   . Heart disease Maternal Grandmother   . Heart disease Maternal Grandfather     Surgical History Past Surgical History  Procedure Laterality Date  . Knee arthoscopy    . Coronary artery bypass graft      2001 (LIMA to LAD, SVG to obtuse marginal, sequential  SVG to RV , marginal branch in the distal right coronary artery.  . Tonsillectomy    . Right carotid endarterectomy    . Left carpal tunnel    . Carpal tunnel release Right   . Cardiac catheterization      2001  . Orif shoulder fracture Right 08/30/2013    Procedure: HEMI ARTHROPLASTY;  Surgeon: [name], MD;  Location: [place];  Service: Orthopedics;  Laterality: Right;  . Carotid endarterectomy Right     cea  Dr. Amedeo Plenty    Allergies  Allergen Reactions  . Codeine Hives, Itching and Other (See Comments)    extreme agitation  . Losartan Other (See Comments)  . Oxycodone Hives and Itching  . Penicillins Rash and Swelling  . Pentazocine Other (See Comments) and Nausea And Vomiting  . Quinapril  Cough    REACTION: cough  . Sibutramine Palpitations    REACTION: palpitations  . Sulfamethoxazole Nausea And Vomiting    hives  . Sulfonamide Derivatives Rash  . Ace Inhibitors Cough    cough  . Eggs Or Egg-Derived Products Hives, Diarrhea and Other (See Comments)    Pt stated "My esophagus swells, I get hives, diarrhea, and I pass out"  . Losartan Potassium     cough  . Other Other (See Comments)    EGGS: passes out, hives    Current Outpatient Prescriptions  Medication Sig Dispense Refill  . acetaminophen (TYLENOL) 325 MG tablet Take 650 mg by mouth as needed.    Marland Kitchen aspirin 81 MG tablet Take 81 mg by mouth daily.      Marland Kitchen atenolol (TENORMIN) 50 MG tablet Take 1 tablet (50 mg total) by mouth daily. 90 tablet 3  . beclomethasone (QVAR) 40 MCG/ACT inhaler Inhale 1 puff  into the lungs 2 (two) times daily as needed (for breathing difficulty during allergery season).     . bisacodyl (DULCOLAX) 5 MG EC tablet Take 5 mg by mouth daily as needed for moderate constipation.     . fexofenadine (ALLEGRA) 180 MG tablet Take 180 mg by mouth daily as needed for allergies.     . folic acid (FOLVITE) A999333 MCG tablet Take 400 mcg by mouth daily.      [name] Aspart-Potassium Aspart (ASPARTATE MG & K) 90-90 MG CAPS Take 1 capsule by mouth 3 (three) times a week. On Mon, Wed, Fri    . Magnesium 250 MG TABS Take 250 mg by mouth 2 (two) times daily.     . meclizine (ANTIVERT) 25 MG tablet Take 25 mg by mouth daily as needed for dizziness.     . methocarbamol (ROBAXIN) 500 MG tablet Take 1 tablet (500 mg total) by mouth 3 (three) times daily as needed for muscle spasms. 60 tablet 1  . Omega-3 Fatty Acids (FISH OIL) 1000 MG CAPS Take 1,000 mg by mouth daily.     . pantoprazole (PROTONIX) 40 MG tablet Take 40 mg by mouth as needed.     . triamterene-hydrochlorothiazide (DYAZIDE) 37.5-25 MG per capsule Take 1 capsule by mouth daily as needed (for fluid retension and swelling).    Marland Kitchen HYDROcodone-acetaminophen (NORCO) 5-325 MG per tablet Take 1-2 tablets by mouth every 4 (four) hours as needed for moderate pain. (Patient not taking: Reported on 11/01/2014) 60 tablet 0  . simvastatin (ZOCOR) 40 MG tablet Take 1 tablet (40 mg total) by mouth 3 (three) times a week. (Patient not taking: Reported on 11/07/2015) 50 tablet 3   No current facility-administered medications for this visit.    Review of Systems : See HPI for pertinent positives and negatives.  Physical Examination  Filed Vitals:   11/07/15 1448 11/07/15 1458  BP: 156/80 156/84  Pulse: 70 76  Temp: 97.9 F (36.6 C)   Resp: 16   Height: 5' 6.5" (1.689 m)   Weight: 238 lb (107.956 kg)   SpO2: 92%    Body mass index is 37.84 kg/(m^2).  General: WDWN obese female in NAD GAIT: antalgic Eyes: PERRLA Pulmonary: Respirations  are non-labored, CTAB Cardiac: regular rhythm with occasional missed contraction,positive murmur.  VASCULAR EXAM Carotid Bruits Left Right   Negative Transmitted cardiac murmur   Radial pulses are 2+ palpable and equal.      LE Pulses LEFT RIGHT   POPLITEAL not palpable  not palpable   POSTERIOR TIBIAL  not palpable  2+ palpable    DORSALIS PEDIS  ANTERIOR TIBIAL 2+ palpable  not palpable     Gastrointestinal: soft, nontender, BS WNL, no r/g, no palpable masses.  Musculoskeletal: No muscle atrophy/wasting. M/S 5/5 throughout except 3/5 in right UE, Extremities without ischemic changes.  Neurologic: A&O X 3; Appropriate Affect, Speech is normal CN 2-12 intact, Pain and light touch intact in extremities, Motor exam as listed above.              Non-Invasive Vascular Imaging CAROTID DUPLEX 11/07/2015   CEREBROVASCULAR DUPLEX EVALUATION    INDICATION: Carotid artery disease    PREVIOUS INTERVENTION(S): Right carotid endarterectomy 05/22/2005    DUPLEX EXAM: Carotid duplex    RIGHT  LEFT  Peak Systolic Velocities (cm/s) End Diastolic Velocities (cm/s) Plaque LOCATION Peak Systolic Velocities (cm/s) End Diastolic Velocities (cm/s) Plaque  90 12  CCA PROXIMAL 131 15   78 14  CCA MID 112 13   82 14 HT CCA DISTAL 79 16 HT  103 2  ECA 94 8   112 16 HT ICA PROXIMAL 65 15 HT  66 17  ICA MID 74 16   42 10  ICA DISTAL 65 20     NA ICA / CCA Ratio (PSV) 0.58  Antegrade Vertebral Flow Antegrade  - Brachial Systolic Pressure (mmHg) -  Triphasic Brachial Artery Waveforms Triphasic    Plaque Morphology:  HM = Homogeneous, HT = Heterogeneous, CP = Calcific Plaque, SP = Smooth Plaque, IP = Irregular Plaque     ADDITIONAL FINDINGS: Multiphasic subclavian arteries.    IMPRESSION:  1. Patent right carotid endarterectomy site with no evidence for restenosis, somewhat difficult to visualize due to depth 2. Less than 40% left internal carotid artery stenosis    Compared to the previous exam:  No change since exam of 11/01/2014      Assessment: Lori Price is a 82 y.o. female who is s/p right carotid endarterectomy on 05/22/2005. Pt denies any remote or recent history of stroke or TIA.   Today's carotid duplex suggests  right carotid endarterectomy site with no evidence for restenosis, somewhat difficult to visualize due to depth Less than 40% left internal carotid artery stenosis.  Her atherosclerotic risk factors include CAD, dyslipidemia, and obesity. Fortunately she does not have DM and has never used tobacco. She was exposed to secondhand smoke as a child from her father.    Plan: Follow-up in 1 year with Carotid Duplex scan.   I discussed in depth with the patient the nature of atherosclerosis, and emphasized the importance of maximal medical management including strict control of blood pressure, blood glucose, and lipid levels, obtaining regular exercise, and continued cessation of smoking.  The patient is aware that without maximal medical management the underlying atherosclerotic disease process will progress, limiting the benefit of any interventions. The patient was given information about stroke prevention and what symptoms should prompt the patient to seek immediate medical care. Thank you for allowing [place] to participate in this patient's care.  Clemon Chambers, RN, MSN, FNP-C [facility] Office: [phone]  Clinic Physician: Early  11/07/2015 3:01 PM

## 2015-12-13 ENCOUNTER — Emergency Department (HOSPITAL_COMMUNITY): Payer: Medicare Other

## 2015-12-13 ENCOUNTER — Encounter (HOSPITAL_COMMUNITY): Payer: Self-pay | Admitting: General Practice

## 2015-12-13 ENCOUNTER — Observation Stay (HOSPITAL_COMMUNITY)
Admission: EM | Admit: 2015-12-13 | Discharge: 2015-12-14 | Disposition: A | Discharge status: Home / Self Care | Payer: Medicare Other | Attending: Internal Medicine | Admitting: Internal Medicine

## 2015-12-13 DIAGNOSIS — H8110 Benign paroxysmal vertigo, unspecified ear: Secondary | ICD-10-CM

## 2015-12-13 DIAGNOSIS — I10 Essential (primary) hypertension: Secondary | ICD-10-CM | POA: Insufficient documentation

## 2015-12-13 DIAGNOSIS — Z7951 Long term (current) use of inhaled steroids: Secondary | ICD-10-CM | POA: Insufficient documentation

## 2015-12-13 DIAGNOSIS — R479 Unspecified speech disturbances: Secondary | ICD-10-CM | POA: Diagnosis not present

## 2015-12-13 DIAGNOSIS — Z951 Presence of aortocoronary bypass graft: Secondary | ICD-10-CM | POA: Insufficient documentation

## 2015-12-13 DIAGNOSIS — I2510 Atherosclerotic heart disease of native coronary artery without angina pectoris: Secondary | ICD-10-CM | POA: Diagnosis present

## 2015-12-13 DIAGNOSIS — I639 Cerebral infarction, unspecified: Secondary | ICD-10-CM | POA: Diagnosis not present

## 2015-12-13 DIAGNOSIS — J45909 Unspecified asthma, uncomplicated: Secondary | ICD-10-CM | POA: Insufficient documentation

## 2015-12-13 DIAGNOSIS — R1310 Dysphagia, unspecified: Secondary | ICD-10-CM | POA: Diagnosis not present

## 2015-12-13 DIAGNOSIS — E785 Hyperlipidemia, unspecified: Secondary | ICD-10-CM | POA: Insufficient documentation

## 2015-12-13 DIAGNOSIS — R2990 Unspecified symptoms and signs involving the nervous system: Secondary | ICD-10-CM

## 2015-12-13 DIAGNOSIS — G459 Transient cerebral ischemic attack, unspecified: Secondary | ICD-10-CM | POA: Diagnosis not present

## 2015-12-13 DIAGNOSIS — Z79899 Other long term (current) drug therapy: Secondary | ICD-10-CM | POA: Insufficient documentation

## 2015-12-13 DIAGNOSIS — Z7982 Long term (current) use of aspirin: Secondary | ICD-10-CM | POA: Insufficient documentation

## 2015-12-13 DIAGNOSIS — G4733 Obstructive sleep apnea (adult) (pediatric): Secondary | ICD-10-CM | POA: Insufficient documentation

## 2015-12-13 DIAGNOSIS — R059 Cough, unspecified: Secondary | ICD-10-CM | POA: Diagnosis present

## 2015-12-13 DIAGNOSIS — Z6839 Body mass index (BMI) 39.0-39.9, adult: Secondary | ICD-10-CM | POA: Diagnosis not present

## 2015-12-13 DIAGNOSIS — K219 Gastro-esophageal reflux disease without esophagitis: Secondary | ICD-10-CM | POA: Diagnosis not present

## 2015-12-13 DIAGNOSIS — R0602 Shortness of breath: Secondary | ICD-10-CM | POA: Diagnosis not present

## 2015-12-13 DIAGNOSIS — R05 Cough: Secondary | ICD-10-CM | POA: Diagnosis present

## 2015-12-13 DIAGNOSIS — E669 Obesity, unspecified: Secondary | ICD-10-CM | POA: Diagnosis not present

## 2015-12-13 DIAGNOSIS — I159 Secondary hypertension, unspecified: Secondary | ICD-10-CM | POA: Diagnosis not present

## 2015-12-13 HISTORY — DX: Benign paroxysmal vertigo, unspecified ear: H81.10

## 2015-12-13 HISTORY — DX: Unspecified osteoarthritis, unspecified site: M19.90

## 2015-12-13 LAB — I-STAT TROPONIN, ED: Troponin i, poc: 0.01 ng/mL (ref 0.00–0.08)

## 2015-12-13 LAB — COMPREHENSIVE METABOLIC PANEL
ALBUMIN: 3.6 g/dL (ref 3.5–5.0)
ALT: 21 U/L (ref 14–54)
AST: 30 U/L (ref 15–41)
Alkaline Phosphatase: 92 U/L (ref 38–126)
Anion gap: 7 (ref 5–15)
BUN: 16 mg/dL (ref 6–20)
CHLORIDE: 106 mmol/L (ref 101–111)
CO2: 25 mmol/L (ref 22–32)
CREATININE: 0.69 mg/dL (ref 0.44–1.00)
Calcium: 9.6 mg/dL (ref 8.9–10.3)
GFR calc non Af Amer: >60 mL/min (ref >60)
Glucose, Bld: 137 mg/dL — ABNORMAL HIGH (ref 65–99)
Potassium: 4.4 mmol/L (ref 3.5–5.1)
SODIUM: 138 mmol/L (ref 135–145)
Total Bilirubin: 1.1 mg/dL (ref 0.3–1.2)
Total Protein: 6.6 g/dL (ref 6.5–8.1)

## 2015-12-13 LAB — I-STAT CHEM 8, ED
BUN: 24 mg/dL — ABNORMAL HIGH (ref 6–20)
CREATININE: 0.6 mg/dL (ref 0.44–1.00)
Calcium, Ion: 1.15 mmol/L (ref 1.12–1.23)
Chloride: 104 mmol/L (ref 101–111)
Glucose, Bld: 138 mg/dL — ABNORMAL HIGH (ref 65–99)
HEMATOCRIT: 48 % — AB (ref 36.0–46.0)
HEMOGLOBIN: 16.3 g/dL — AB (ref 12.0–15.0)
POTASSIUM: 4.6 mmol/L (ref 3.5–5.1)
Sodium: 140 mmol/L (ref 135–145)
TCO2: 28 mmol/L (ref 0–100)

## 2015-12-13 LAB — DIFFERENTIAL
BASOS ABS: 0 10*3/uL (ref 0.0–0.1)
BASOS PCT: 0 %
Eosinophils Absolute: 0.3 10*3/uL (ref 0.0–0.7)
Eosinophils Relative: 4 %
Lymphocytes Relative: 25 %
Lymphs Abs: 1.8 10*3/uL (ref 0.7–4.0)
Monocytes Absolute: 0.6 10*3/uL (ref 0.1–1.0)
Monocytes Relative: 8 %
NEUTROS ABS: 4.6 10*3/uL (ref 1.7–7.7)
NEUTROS PCT: 63 %

## 2015-12-13 LAB — APTT: APTT: 32 s (ref 24–36)

## 2015-12-13 LAB — CBC
HCT: 49.2 % — ABNORMAL HIGH (ref 36.0–46.0)
Hemoglobin: 16.4 g/dL — ABNORMAL HIGH (ref 12.0–15.0)
MCH: 30.8 pg (ref 26.0–34.0)
MCHC: 33.3 g/dL (ref 30.0–36.0)
MCV: 92.5 fL (ref 78.0–100.0)
PLATELETS: 218 10*3/uL (ref 150–400)
RBC: 5.32 MIL/uL — ABNORMAL HIGH (ref 3.87–5.11)
RDW: 13.3 % (ref 11.5–15.5)
WBC: 7.2 10*3/uL (ref 4.0–10.5)

## 2015-12-13 LAB — CBG MONITORING, ED
Glucose-Capillary: 135 mg/dL — ABNORMAL HIGH (ref 65–99)
Glucose-Capillary: 124 mg/dL — ABNORMAL HIGH (ref 65–99)

## 2015-12-13 LAB — PROTIME-INR
INR: 0.98
PROTHROMBIN TIME: 13 s (ref 11.4–15.2)

## 2015-12-13 MED ORDER — SODIUM CHLORIDE 0.9 % IV SOLN
INTRAVENOUS | Status: AC
  Administered 2015-12-13 – 2015-12-14 (×2): via INTRAVENOUS

## 2015-12-13 MED ORDER — ENOXAPARIN SODIUM 40 MG/0.4ML ~~LOC~~ SOLN
40.0000 mg | SUBCUTANEOUS | Status: DC
  Administered 2015-12-13: 40 mg via SUBCUTANEOUS
  Filled 2015-12-13: qty 0.4

## 2015-12-13 MED ORDER — HYDROCODONE-ACETAMINOPHEN 5-325 MG PO TABS: 1.0000 | ORAL_TABLET | ORAL | Status: DC | PRN

## 2015-12-13 MED ORDER — HYDRALAZINE HCL 20 MG/ML IJ SOLN
10.0000 mg | Freq: Once | INTRAMUSCULAR | Status: AC
  Administered 2015-12-13: 10 mg via INTRAVENOUS
  Filled 2015-12-13: qty 1

## 2015-12-13 MED ORDER — MECLIZINE HCL 12.5 MG PO TABS: 25.0000 mg | ORAL_TABLET | Freq: Every day | ORAL | Status: DC | PRN

## 2015-12-13 MED ORDER — PANTOPRAZOLE SODIUM 40 MG PO TBEC
40.0000 mg | DELAYED_RELEASE_TABLET | Freq: Every day | ORAL | Status: DC
  Administered 2015-12-14: 40 mg via ORAL
  Filled 2015-12-13: qty 1

## 2015-12-13 MED ORDER — ATENOLOL 25 MG PO TABS
50.0000 mg | ORAL_TABLET | Freq: Once | ORAL | Status: DC
  Filled 2015-12-13: qty 1

## 2015-12-13 MED ORDER — METHOCARBAMOL 500 MG PO TABS: 500.0000 mg | ORAL_TABLET | Freq: Three times a day (TID) | ORAL | Status: DC | PRN

## 2015-12-13 MED ORDER — BUDESONIDE 0.25 MG/2ML IN SUSP
0.2500 mg | Freq: Two times a day (BID) | RESPIRATORY_TRACT | Status: DC
  Administered 2015-12-13 – 2015-12-14 (×2): 0.25 mg via RESPIRATORY_TRACT
  Filled 2015-12-13 (×2): qty 2

## 2015-12-13 MED ORDER — ASPIRIN EC 81 MG PO TBEC
81.0000 mg | DELAYED_RELEASE_TABLET | Freq: Every day | ORAL | Status: DC
  Administered 2015-12-14: 81 mg via ORAL
  Filled 2015-12-13: qty 1

## 2015-12-13 NOTE — ED Notes (Signed)
Called carelink to activate Code Stroke

## 2015-12-13 NOTE — Consult Note (Signed)
Requesting Physician: Dr. Darl Householder    Chief Complaint: Code stroke  History obtained from:  Patient     HPI:                                                                                                                                         Lori Price is an 82 y.o. female presenting to ED after her cleaning lady noted at 1100 she had a right facial droop. The actual LSN is unknown. The patient herself felt that she was having a hard time articulating words.  She went to Urgent care where her BP was noted to be elevated in the [id number] systolic.  On arrival to CT she does have a slight right NL fold decrease but her speech is clear with some hesitation but no dysarthria. She has no other localizing symptoms.   Date last known well: Unable to determine Time last known well: Unable to determine tPA Given: No: no clear LSN   Past Medical History:  Diagnosis Date  . Asthma   . Carotid artery occlusion   . Chronic cough   . Complication of anesthesia    dental work  bp dropped  . Coronary artery disease    s/p MI 2001--(catheterizations in 2001 with left main 70% stenosis, LAD 70% followed by 90% stenosis, circumflex 99% stenosis, right coronary artery occluded.   . Hyperlipidemia   . Hypertension    dr Percival Spanish  . Nephrolithiasis   . Peripheral vascular disease ([place])   . Sleep apnea    CPAP   no  . Vertigo     Past Surgical History:  Procedure Laterality Date  . CARDIAC CATHETERIZATION     2001  . CAROTID ENDARTERECTOMY Right    cea  Dr. Amedeo Plenty  . CARPAL TUNNEL RELEASE Right   . CORONARY ARTERY BYPASS GRAFT     2001 (LIMA to LAD, SVG to obtuse marginal, sequential  SVG to RV , marginal branch in the distal right coronary artery.  Marland Kitchen knee arthoscopy    . left carpal tunnel    . ORIF SHOULDER FRACTURE Right 08/30/2013   Procedure: HEMI ARTHROPLASTY;  Surgeon: [name], MD;  Location: [place];  Service: Orthopedics;  Laterality: Right;  . right carotid endarterectomy    .  TONSILLECTOMY      Family History  Problem Relation Age of Onset  . Ovarian cancer Mother   . Cancer Mother     Ovarian  . COPD Father   . Aneurysm Father     Throat  . Kidney disease Father   . Heart disease Maternal Grandmother   . Heart disease Maternal Grandfather    Social History:  reports that she has never smoked. She has never used smokeless tobacco. She reports that she does not drink alcohol or use drugs.  Allergies:  Allergies  Allergen Reactions  . Codeine Hives,  Itching and Other (See Comments)    extreme agitation  . Losartan Other (See Comments)  . Oxycodone Hives and Itching  . Penicillins Rash and Swelling  . Pentazocine Other (See Comments) and Nausea And Vomiting  . Quinapril Cough    REACTION: cough  . Sibutramine Palpitations    REACTION: palpitations  . Sulfamethoxazole Nausea And Vomiting    hives  . Sulfonamide Derivatives Rash  . Ace Inhibitors Cough    cough  . Eggs Or Egg-Derived Products Hives, Diarrhea and Other (See Comments)    Pt stated "My esophagus swells, I get hives, diarrhea, and I pass out"  . Losartan Potassium     cough  . Other Other (See Comments)    EGGS: passes out, hives    Medications:                                                                                                                           No current facility-administered medications for this encounter.    Current Outpatient Prescriptions  Medication Sig Dispense Refill  . acetaminophen (TYLENOL) 325 MG tablet Take 650 mg by mouth as needed.    Marland Kitchen aspirin 81 MG tablet Take 81 mg by mouth daily.      Marland Kitchen atenolol (TENORMIN) 50 MG tablet Take 1 tablet (50 mg total) by mouth daily. 90 tablet 3  . beclomethasone (QVAR) 40 MCG/ACT inhaler Inhale 1 puff into the lungs 2 (two) times daily as needed (for breathing difficulty during allergery season).     . bisacodyl (DULCOLAX) 5 MG EC tablet Take 5 mg by mouth daily as needed for moderate constipation.     .  fexofenadine (ALLEGRA) 180 MG tablet Take 180 mg by mouth daily as needed for allergies.     . folic acid (FOLVITE) A999333 MCG tablet Take 400 mcg by mouth daily.      Marland Kitchen HYDROcodone-acetaminophen (NORCO) 5-325 MG per tablet Take 1-2 tablets by mouth every 4 (four) hours as needed for moderate pain. (Patient not taking: Reported on 11/01/2014) 60 tablet 0  . Mag Aspart-Potassium Aspart (ASPARTATE MG & K) 90-90 MG CAPS Take 1 capsule by mouth 3 (three) times a week. On Mon, Wed, Fri    . Magnesium 250 MG TABS Take 250 mg by mouth 2 (two) times daily.     . meclizine (ANTIVERT) 25 MG tablet Take 25 mg by mouth daily as needed for dizziness.     . methocarbamol (ROBAXIN) 500 MG tablet Take 1 tablet (500 mg total) by mouth 3 (three) times daily as needed for muscle spasms. 60 tablet 1  . Omega-3 Fatty Acids (FISH OIL) 1000 MG CAPS Take 1,000 mg by mouth daily.     . pantoprazole (PROTONIX) 40 MG tablet Take 40 mg by mouth as needed.     . simvastatin (ZOCOR) 40 MG tablet Take 1 tablet (40 mg total) by mouth 3 (three) times a week. (Patient not taking: Reported on 11/07/2015)  50 tablet 3  . triamterene-hydrochlorothiazide (DYAZIDE) 37.5-25 MG per capsule Take 1 capsule by mouth daily as needed (for fluid retension and swelling).       ROS:                                                                                                                                       History obtained from the patient  General ROS: negative for - chills, fatigue, fever, night sweats, weight gain or weight loss Psychological ROS: negative for - behavioral disorder, hallucinations, memory difficulties, mood swings or suicidal ideation Ophthalmic ROS: negative for - blurry vision, double vision, eye pain or loss of vision ENT ROS: negative for - epistaxis, nasal discharge, oral lesions, sore throat, tinnitus or vertigo Allergy and Immunology ROS: negative for - hives or itchy/watery eyes Hematological and Lymphatic ROS:  negative for - bleeding problems, bruising or swollen lymph nodes Endocrine ROS: negative for - galactorrhea, hair pattern changes, polydipsia/polyuria or temperature intolerance Respiratory ROS: negative for - cough, hemoptysis, shortness of breath or wheezing Cardiovascular ROS: negative for - chest pain, dyspnea on exertion, edema or irregular heartbeat Gastrointestinal ROS: negative for - abdominal pain, diarrhea, hematemesis, nausea/vomiting or stool incontinence Genito-Urinary ROS: negative for - dysuria, hematuria, incontinence or urinary frequency/urgency Musculoskeletal ROS: negative for - joint swelling or muscular weakness Neurological ROS: as noted in HPI Dermatological ROS: negative for rash and skin lesion changes  Neurologic Examination:                                                                                                      Blood pressure 164/91, pulse 73, temperature 98.4 F (36.9 C), temperature source Oral, resp. rate 23, height 5\' 5"  (1.651 m), weight 108 kg (238 lb), SpO2 94 %.  HEENT-  Normocephalic, no lesions, without obvious abnormality.  Normal external eye and conjunctiva.  Normal TM's bilaterally.  Normal auditory canals and external ears. Normal external nose, mucus membranes and septum.  Normal pharynx. Cardiovascular- S1, S2 normal, pulses palpable throughout   Lungs- chest clear, no wheezing, rales, normal symmetric air entry Abdomen- normal findings: bowel sounds normal Extremities- no edema Lymph-no adenopathy palpable Musculoskeletal-no joint tenderness, deformity or swelling Skin-warm and dry, no hyperpigmentation, vitiligo, or suspicious lesions  Neurological Examination Mental Status: Alert, oriented, thought content appropriate.  Speech fluent without evidence of aphasia.  Able to follow 3 step commands without difficulty. Cranial Nerves: II:  Visual fields grossly normal, pupils equal, round, reactive to light  and accommodation III,IV,  VI: ptosis not present, extra-ocular motions intact bilaterally V,VII: smile symmetric with resting right NLF decrease, facial light touch sensation normal bilaterally VIII: hearing normal bilaterally IX,X: uvula rises symmetrically XI: bilateral shoulder shrug XII: midline tongue extension Motor: Right : Upper extremity   5/5    Left:     Upper extremity   5/5  Lower extremity   5/5     Lower extremity   5/5 Tone and bulk:normal tone throughout; no atrophy noted Sensory: Pinprick and light touch intact throughout, bilaterally Deep Tendon Reflexes: 1+ and symmetric throughout Plantars: Right: downgoing   Left: downgoing Cerebellar: normal finger-to-nose, normal rapid alternating movements and normal heel-to-shin test Gait: not tested       Lab Results: Basic Metabolic Panel:  Recent Labs Lab 12/13/15 1311 12/13/15 1323  NA 138 140  K 4.4 4.6  CL 106 104  CO2 25  --   GLUCOSE 137* 138*  BUN 16 24*  CREATININE 0.69 0.60  CALCIUM 9.6  --     Liver Function Tests:  Recent Labs Lab 12/13/15 1311  AST 30  ALT 21  ALKPHOS 92  BILITOT 1.1  PROT 6.6  ALBUMIN 3.6   No results for input(s): LIPASE, AMYLASE in the last 168 hours. No results for input(s): AMMONIA in the last 168 hours.  CBC:  Recent Labs Lab 12/13/15 1311 12/13/15 1323  WBC 7.2  --   NEUTROABS 4.6  --   HGB 16.4* 16.3*  HCT 49.2* 48.0*  MCV 92.5  --   PLT 218  --     Cardiac Enzymes: No results for input(s): CKTOTAL, CKMB, CKMBINDEX, TROPONINI in the last 168 hours.  Lipid Panel: No results for input(s): CHOL, TRIG, HDL, CHOLHDL, VLDL, LDLCALC in the last 168 hours.  CBG:  Recent Labs Lab 12/13/15 1313 12/13/15 1339  GLUCAP 135* 124*    Microbiology: No results found for this or any previous visit.  Coagulation Studies:  Recent Labs  12/13/15 1311  LABPROT 13.0  INR 0.98    Imaging: Ct Head Code Stroke W/o Cm  Result Date: 12/13/2015 CLINICAL DATA:  Dysphasia of  acute onset EXAM: CT HEAD WITHOUT CONTRAST TECHNIQUE: Contiguous axial images were obtained from the base of the skull through the vertex without intravenous contrast. COMPARISON:  March 05, 2006 FINDINGS: The ventricles are normal in size and configuration. There is no intracranial mass, hemorrhage, extra-axial fluid collection, or midline shift. There is slight small vessel disease in the centra semiovale bilaterally. Elsewhere gray-white compartments appear normal. No acute infarct is evident. There is no demonstrable hyperdense vessel on this study. There is calcification in both carotid siphon regions. The bony calvarium appears intact. There is opacification of several inferior mastoid air cells on the left. Most of the mastoid air cells are clear. There is mild mucosal thickening in several ethmoid air cells bilaterally. Visualized paranasal sinuses elsewhere are clear. No intraorbital lesions are evident. IMPRESSION: Mild periventricular small vessel disease. No intracranial mass, hemorrhage, or evidence of acute infarct. No hyperdense vessel. There are vascular calcifications in the carotid siphon regions bilaterally. There is opacification several ethmoid air cells as well as in the inferior left mastoid region. Critical Value/emergent results were called by telephone at the time of interpretation on 12/13/2015 at 1:44 pm to Dr. Jordan Hawks, neurology, who verbally acknowledged these results. Electronically Signed   By: Lowella Grip III M.D.   On: 12/13/2015 13:44      Assessment and plan  discussed with with attending physician and they are in agreement.    Etta Quill PA-C [facility] [phone]  12/13/2015, 2:05 PM   Assessment: 82 y.o. female with right NL fold decrease in the setting of elevated BP.   Stroke Risk Factors - hyperlipidemia and hypertension  Neurology Attending:  Remo Lipps had no clear neurological deficits on exam. Maricarmen reports that some friends felt  that she might have a slight facial droop. However there is no evidence of any facial weakness or asymmetry on exam. Was felt her symptoms were likely related to her severe elevation of blood pressure.  Elson Clan, M.D. Neuro hospitalist

## 2015-12-13 NOTE — ED Notes (Signed)
Code stroke cancelled per Dr. Tasia Catchings

## 2015-12-13 NOTE — ED Provider Notes (Signed)
[facility] DEPT Provider Note   CSN: [id number] Arrival date & time: 12/13/15  1306  First Provider Contact:  None       History   Chief Complaint Chief Complaint  Patient presents with  . Code Stroke    HPI Lori Price is a 82 y.o. female history of hypertension, CAD with CABG, hyperlipidemia here presenting with trouble speaking. Patient states that she woke up 5:30 am and felt fine. Took a nap around 10 am and then woke up around 11 am and had trouble speaking. Has trouble pronouncing her "s" sounds. Possible facial droop at the time. She was noted to be hypertensive 200 at urgent care and sent here for stroke workup. Hx of HTN, CAD. No hx of previous stroke. Code stroke activated at triage.   The history is provided by the patient.    Past Medical History:  Diagnosis Date  . Asthma   . Carotid artery occlusion   . Chronic cough   . Complication of anesthesia    dental work  bp dropped  . Coronary artery disease    s/p MI 2001--(catheterizations in 2001 with left main 70% stenosis, LAD 70% followed by 90% stenosis, circumflex 99% stenosis, right coronary artery occluded.   . Hyperlipidemia   . Hypertension    dr Percival Spanish  . Nephrolithiasis   . Peripheral vascular disease ([place])   . Sleep apnea    CPAP   no  . Vertigo     Patient Active Problem List   Diagnosis Date Noted  . Aftercare following surgery of the circulatory system, [place] 10/21/2013  . Fracture of proximal end of right humerus 08/30/2013  . Occlusion and stenosis of carotid artery without mention of cerebral infarction 10/09/2011  . Obesity 09/19/2010  . Carotid stenosis 09/19/2010  . ASTHMA 04/02/2010  . RESTLESS LEG SYNDROME 01/31/2009  . OBSTRUCTIVE SLEEP APNEA 01/03/2009  . HYPERLIPIDEMIA 12/09/2008  . HYPERTENSION 12/09/2008  . CORONARY HEART DISEASE 12/09/2008  . COUGH 12/09/2008    Past Surgical History:  Procedure Laterality Date  . CARDIAC CATHETERIZATION     2001  . CAROTID  ENDARTERECTOMY Right    cea  Dr. Amedeo Plenty  . CARPAL TUNNEL RELEASE Right   . CORONARY ARTERY BYPASS GRAFT     2001 (LIMA to LAD, SVG to obtuse marginal, sequential  SVG to RV , marginal branch in the distal right coronary artery.  Marland Kitchen knee arthoscopy    . left carpal tunnel    . ORIF SHOULDER FRACTURE Right 08/30/2013   Procedure: HEMI ARTHROPLASTY;  Surgeon: [name], MD;  Location: [facility];  Service: Orthopedics;  Laterality: Right;  . right carotid endarterectomy    . TONSILLECTOMY      OB History    No data available       Home Medications    Prior to Admission medications   Medication Sig Start Date End Date Taking? Authorizing Provider  acetaminophen (TYLENOL) 325 MG tablet Take 650 mg by mouth as needed.   Yes Historical Provider, MD  aspirin 81 MG tablet Take 81 mg by mouth daily.     Yes Historical Provider, MD  atenolol (TENORMIN) 50 MG tablet Take 1 tablet (50 mg total) by mouth daily. 12/01/13  Yes Minus Breeding, MD  beclomethasone (QVAR) 40 MCG/ACT inhaler Inhale 1 puff into the lungs 2 (two) times daily as needed (for breathing difficulty during allergery season).    Yes Historical Provider, MD  bisacodyl (DULCOLAX) 5 MG EC tablet  Take 5 mg by mouth daily as needed for moderate constipation.    Yes Historical Provider, MD  fexofenadine (ALLEGRA) 180 MG tablet Take 180 mg by mouth daily as needed for allergies.    Yes Historical Provider, MD  folic acid (FOLVITE) A999333 MCG tablet Take 400 mcg by mouth daily.     Yes Historical Provider, MD  ibuprofen (ADVIL,MOTRIN) 200 MG tablet Take 800 mg by mouth every 6 (six) hours as needed.   Yes Historical Provider, MD  Mag Aspart-Potassium Aspart (ASPARTATE MG & K) 90-90 MG CAPS Take 1 capsule by mouth 3 (three) times a week. On Mon, Wed, Fri   Yes Historical Provider, MD  Magnesium 250 MG TABS Take 250 mg by mouth 2 (two) times daily.    Yes Historical Provider, MD  meclizine (ANTIVERT) 25 MG tablet Take 25 mg by mouth daily as  needed for dizziness.    Yes Historical Provider, MD  Omega-3 Fatty Acids (FISH OIL) 1000 MG CAPS Take 1,000 mg by mouth daily.    Yes Historical Provider, MD  triamterene-hydrochlorothiazide (DYAZIDE) 37.5-25 MG per capsule Take 1 capsule by mouth daily as needed (for fluid retension and swelling).   Yes Historical Provider, MD  HYDROcodone-acetaminophen (NORCO) 5-325 MG per tablet Take 1-2 tablets by mouth every 4 (four) hours as needed for moderate pain. Patient not taking: Reported on 11/01/2014 09/01/13   Netta Cedars, MD  methocarbamol (ROBAXIN) 500 MG tablet Take 1 tablet (500 mg total) by mouth 3 (three) times daily as needed for muscle spasms. Patient not taking: Reported on 12/13/2015 09/01/13   Netta Cedars, MD  pantoprazole (PROTONIX) 40 MG tablet Take 40 mg by mouth as needed.     Historical Provider, MD  simvastatin (ZOCOR) 40 MG tablet Take 1 tablet (40 mg total) by mouth 3 (three) times a week. Patient not taking: Reported on 11/07/2015 12/01/13   Minus Breeding, MD    Family History Family History  Problem Relation Age of Onset  . Ovarian cancer Mother   . Cancer Mother     Ovarian  . COPD Father   . Aneurysm Father     Throat  . Kidney disease Father   . Heart disease Maternal Grandmother   . Heart disease Maternal Grandfather     Social History Social History  Substance Use Topics  . Smoking status: Never Smoker  . Smokeless tobacco: Never Used  . Alcohol use No     Allergies   Codeine; Losartan; Oxycodone; Penicillins; Pentazocine; Quinapril; Sibutramine; Sulfamethoxazole; Sulfonamide derivatives; Ace inhibitors; Eggs or egg-derived products; Losartan potassium; and Other   Review of Systems Review of Systems  Neurological: Positive for speech difficulty.  All other systems reviewed and are negative.    Physical Exam Updated Vital Signs BP 195/92   Pulse 81   Temp 98.4 F (36.9 C) (Oral)   Resp 15   Ht 5\' 5"  (1.651 m)   Wt 238 lb (108 kg)   SpO2  91%   BMI 39.61 kg/m   Physical Exam  Constitutional: She is oriented to person, place, and time. She appears well-developed and well-nourished.  HENT:  Head: Normocephalic.  Eyes: Conjunctivae and EOM are normal. Pupils are equal, round, and reactive to light.  Neck: Normal range of motion. Neck supple.  Cardiovascular: Normal rate and regular rhythm.   Pulmonary/Chest: Effort normal and breath sounds normal.  Abdominal: Soft. Bowel sounds are normal.  Musculoskeletal: Normal range of motion.  Neurological: She is alert and oriented  to person, place, and time.  CN 2- 12 intact. Nl strength throughout. No obvious slurred speech   Skin: Skin is warm.  Psychiatric: She has a normal mood and affect.  Nursing note and vitals reviewed.    ED Treatments / Results  Labs (all labs ordered are listed, but only abnormal results are displayed) Labs Reviewed  CBC - Abnormal; Notable for the following:       Result Value   RBC 5.32 (*)    Hemoglobin 16.4 (*)    HCT 49.2 (*)    All other components within normal limits  COMPREHENSIVE METABOLIC PANEL - Abnormal; Notable for the following:    Glucose, Bld 137 (*)    All other components within normal limits  CBG MONITORING, ED - Abnormal; Notable for the following:    Glucose-Capillary 135 (*)    All other components within normal limits  CBG MONITORING, ED - Abnormal; Notable for the following:    Glucose-Capillary 124 (*)    All other components within normal limits  I-STAT CHEM 8, ED - Abnormal; Notable for the following:    BUN 24 (*)    Glucose, Bld 138 (*)    Hemoglobin 16.3 (*)    HCT 48.0 (*)    All other components within normal limits  PROTIME-INR  APTT  DIFFERENTIAL  I-STAT TROPOININ, ED    EKG  EKG Interpretation  Date/Time:  Wednesday December 13 2015 13:47:00 EDT Ventricular Rate:  66 PR Interval:    QRS Duration: 104 QT Interval:  399 QTC Calculation: 418 R Axis:   57 Text Interpretation:  Sinus rhythm  Multiple ventricular premature complexes Consider left atrial enlargement Low voltage, precordial leads Borderline repolarization abnormality No significant change since last tracing Confirmed by YAO  MD, DAVID ([id number]) on 12/13/2015 2:32:59 PM       Radiology Ct Head Code Stroke W/o Cm  Result Date: 12/13/2015 CLINICAL DATA:  Dysphasia of acute onset EXAM: CT HEAD WITHOUT CONTRAST TECHNIQUE: Contiguous axial images were obtained from the base of the skull through the vertex without intravenous contrast. COMPARISON:  March 05, 2006 FINDINGS: The ventricles are normal in size and configuration. There is no intracranial mass, hemorrhage, extra-axial fluid collection, or midline shift. There is slight small vessel disease in the centra semiovale bilaterally. Elsewhere gray-white compartments appear normal. No acute infarct is evident. There is no demonstrable hyperdense vessel on this study. There is calcification in both carotid siphon regions. The bony calvarium appears intact. There is opacification of several inferior mastoid air cells on the left. Most of the mastoid air cells are clear. There is mild mucosal thickening in several ethmoid air cells bilaterally. Visualized paranasal sinuses elsewhere are clear. No intraorbital lesions are evident. IMPRESSION: Mild periventricular small vessel disease. No intracranial mass, hemorrhage, or evidence of acute infarct. No hyperdense vessel. There are vascular calcifications in the carotid siphon regions bilaterally. There is opacification several ethmoid air cells as well as in the inferior left mastoid region. Critical Value/emergent results were called by telephone at the time of interpretation on 12/13/2015 at 1:44 pm to Dr. Jordan Hawks, neurology, who verbally acknowledged these results. Electronically Signed   By: Lowella Grip III M.D.   On: 12/13/2015 13:44   Procedures Procedures (including critical care time)  Medications Ordered in  ED Medications  atenolol (TENORMIN) tablet 50 mg (50 mg Oral Not Given 12/13/15 1503)  hydrALAZINE (APRESOLINE) injection 10 mg (10 mg Intravenous Given 12/13/15 1452)     Initial  Impression / Assessment and Plan / ED Course  I have reviewed the triage vital signs and the nursing notes.  Pertinent labs & imaging results that were available during my care of the patient were reviewed by me and considered in my medical decision making (see chart for details).  Clinical Course   Lori Price is a 82 y.o. female here with slurred speech. Speech back to baseline. Consider TIA vs hypertensive encephalopathy. Neuro at bedside. Will get labs, CT head.   3:09 PM WBC nl. Labs unremarkable. CT showed no bleed. I discussed case with Dr. Tasia Catchings from neuro. He states that likely hypertensive encephalopathy (patient out of atenolol for 6 weeks). BP 190 on arrival. Will give some hydralazine. I am concerned for possible TIA, will admit. Also patient has been having trouble swallowing at home and has been coughing when she eats. Failed swallow screen. Ordered speech and swallow.      Final Clinical Impressions(s) / ED Diagnoses   Final diagnoses:  None    New Prescriptions New Prescriptions   No medications on file     Drenda Freeze, MD 12/13/15 1530

## 2015-12-13 NOTE — ED Notes (Signed)
Code stroke cancelled by Neurologist.

## 2015-12-13 NOTE — ED Notes (Signed)
Dr. Yao at bedside. 

## 2015-12-13 NOTE — ED Notes (Signed)
Patient transported to X-ray 

## 2015-12-13 NOTE — ED Notes (Signed)
Pt states "every other meal when I drink I cough, it's been going on for at least the 3-4 years, but its been getting worse over the last 6 months, and over the last month it's been even worse and the house keeper has even noticed me coughing more". Dr. Darl Householder aware.

## 2015-12-13 NOTE — Consult Note (Signed)
Initial Neurological Consultation                      NEURO HOSPITALIST CONSULT NOTE   Requestig physician: Dr. Marily Memos   Reason for Consult:Word finding difficulties with questionable facial droop.   HPI:                                                                                                                                          Lori Price is an 82 y.o. female who presents with a history of some mild confusion and word finding difficulties that was noted this morning. She reports that a few friends thought that perhaps she might have experienced a slight right facial droop as well. [name] has a known history of hypertension hyperlipidemia and coronary artery disease. Unfortunately [name] ran out of her antihypertensive medications approximately a month ago. Upon arrival of the EMS team [name] was noted to have a systolic blood pressure in excess of 100.  Jones CT of the brain was unremarkable. Upon direct neurological evaluation she had no obvious focal neurological deficits and no clear facial asymmetry. She was not experiencing any word finding difficulties during the neurological exam. The exact onset of these symptoms was unclear. [name] was noted to move all 4 extremities with no focal motor or sensory deficits and no evidence of cerebellar dysfunction.  Past Medical History:  Diagnosis Date  . Asthma   . Carotid artery occlusion   . Chronic cough   . Complication of anesthesia    dental work  bp dropped  . Coronary artery disease    s/p MI 2001--(catheterizations in 2001 with left main 70% stenosis, LAD 70% followed by 90% stenosis, circumflex 99% stenosis, right coronary artery occluded.   . Hyperlipidemia   . Hypertension    dr Percival Spanish  . Nephrolithiasis   . Peripheral vascular disease ([place])   . Sleep apnea    CPAP   no  . Vertigo     Past Surgical History:  Procedure Laterality Date  . CARDIAC CATHETERIZATION     2001  . CAROTID ENDARTERECTOMY Right    ce[facility]   Dr. Amedeo Plenty  . CARPAL TUNNEL RELEASE Right   . CORONARY ARTERY BYPASS GRAFT     2001 (LIMA to LAD, SVG to obtuse marginal, sequential  SVG to RV , marginal branch in the distal right coronary artery.  Marland Kitchen knee arthoscopy    . left carpal tunnel    . ORIF SHOULDER FRACTURE Right 08/30/2013   Procedure: HEMI ARTHROPLASTY;  Surgeon: [name], MD;  Location: [facility];  Service: Orthopedics;  Laterality: Right;  . right carotid endarterectomy    . TONSILLECTOMY      MEDICATIONS:  I have reviewed the patient's current medications.  Allergies  Allergen Reactions  . Codeine Hives, Itching and Other (See Comments)    extreme agitation  . Losartan Other (See Comments)    "I can't walk, I can't do anything"   . Oxycodone Hives and Itching  . Penicillins Swelling and Rash    Has patient had a PCN reaction causing immediate rash, facial/tongue/throat swelling, SOB or lightheadedness with hypotension: NO Has patient had a PCN reaction causing severe rash involving mucus membranes or skin necrosis: NO Has patient had a PCN reaction that required hospitalization: NO  Has patient had a PCN reaction occurring within the last 10 years: NO If all of the above answers are "NO", then may proceed with Cephalosporin use.    Marland Kitchen Pentazocine Other (See Comments) and Nausea And Vomiting  . Quinapril Cough    REACTION: cough  . Sibutramine Palpitations    REACTION: palpitations  . Sulfamethoxazole Nausea And Vomiting    hives  . Sulfonamide Derivatives Rash  . Ace Inhibitors Cough    cough  . Eggs Or Egg-Derived Products Hives, Diarrhea and Other (See Comments)    Pt stated "My esophagus swells, I get hives, diarrhea, and I pass out"  . Losartan Potassium     cough  . Other Other (See Comments)    EGGS: passes out, hives     Social History:  reports that she has never smoked. She has  never used smokeless tobacco. She reports that she does not drink alcohol or use drugs.  Family History  Problem Relation Age of Onset  . Ovarian cancer Mother   . Cancer Mother     Ovarian  . COPD Father   . Aneurysm Father     Throat  . Kidney disease Father   . Heart disease Maternal Grandmother   . Heart disease Maternal Grandfather      ROS:                                                                                                                                       History obtained from chart review  General ROS: negative for - chills, fatigue, fever, night sweats, weight gain or weight loss Psychological ROS: negative for - behavioral disorder, hallucinations, memory difficulties, mood swings or suicidal ideation Ophthalmic ROS: negative for - blurry vision, double vision, eye pain or loss of vision ENT ROS: negative for - epistaxis, nasal discharge, oral lesions, sore throat, tinnitus or vertigo Allergy and Immunology ROS: negative for - hives or itchy/watery eyes Hematological and Lymphatic ROS: negative for - bleeding problems, bruising or swollen lymph nodes Endocrine ROS: negative for - galactorrhea, hair pattern changes, polydipsia/polyuria or temperature intolerance Respiratory ROS: negative for - cough, hemoptysis, shortness of breath or wheezing Cardiovascular ROS: negative for - chest pain, dyspnea on exertion, edema or irregular heartbeat Gastrointestinal ROS: negative for - abdominal pain, diarrhea, hematemesis,  nausea/vomiting or stool incontinence Genito-Urinary ROS: negative for - dysuria, hematuria, incontinence or urinary frequency/urgency Musculoskeletal ROS: negative for - joint swelling or muscular weakness Neurological ROS: as noted in HPI Dermatological ROS: negative for rash and skin lesion changes   General Exam                                                                                                      Blood pressure 130/81, pulse  60, temperature 97.8 F (36.6 C), temperature source Oral, resp. rate 16, height 5\' 5"  (1.651 m), weight 108 kg (238 lb), SpO2 94 %. HEENT-  Normocephalic, no lesions, without obvious abnormality.  Normal external eye and conjunctiva.  Normal TM's bilaterally.  Normal auditory canals and external ears. Normal external nose, mucus membranes and septum.  Normal pharynx. Cardiovascular- regular rate and rhythm, S1, S2 normal, no murmur, click, rub or gallop, pulses palpable throughout   Lungs- chest clear, no wheezing, rales, normal symmetric air entry, Heart exam - S1, S2 normal, no murmur, no gallop, rate regular Abdomen- soft, non-tender; bowel sounds normal; no masses,  no organomegaly Extremities- less then 2 second capillary refill Lymph-no adenopathy palpable Musculoskeletal-no joint tenderness, deformity or swelling Skin-warm and dry, no hyperpigmentation, vitiligo, or suspicious lesions  Neurological Examination Mental Status: Alert, oriented, thought content appropriate.  Speech fluent without evidence of aphasia.  Able to follow 3 step commands without difficulty. Cranial Nerves: II: Discs flat bilaterally; Visual fields grossly normal, pupils equal, round, reactive to light and accommodation III,IV, VI: ptosis not present, extra-ocular motions intact bilaterally V,VII: smile symmetric, facial light touch sensation normal bilaterally VIII: hearing normal bilaterally IX,X: uvula rises symmetrically XI: bilateral shoulder shrug XII: midline tongue extension Motor: Right : Upper extremity   5/5    Left:     Upper extremity   5/5  Lower extremity   5/5     Lower extremity   5/5 Tone and bulk:normal tone throughout; no atrophy noted Sensory: Pinprick and light touch intact throughout, bilaterally Deep Tendon Reflexes: 2+ and symmetric throughout Plantars: Right: downgoing   Left: downgoing Cerebellar: normal finger-to-nose, normal rapid alternating movements and normal heel-to-shin  test Gait: normal gait and station    Lab Results: Basic Metabolic Panel:  Recent Labs Lab 12/13/15 1311 12/13/15 1323  NA 138 140  K 4.4 4.6  CL 106 104  CO2 25  --   GLUCOSE 137* 138*  BUN 16 24*  CREATININE 0.69 0.60  CALCIUM 9.6  --     Liver Function Tests:  Recent Labs Lab 12/13/15 1311  AST 30  ALT 21  ALKPHOS 92  BILITOT 1.1  PROT 6.6  ALBUMIN 3.6   No results for input(s): LIPASE, AMYLASE in the last 168 hours. No results for input(s): AMMONIA in the last 168 hours.  CBC:  Recent Labs Lab 12/13/15 1311 12/13/15 1323  WBC 7.2  --   NEUTROABS 4.6  --   HGB 16.4* 16.3*  HCT 49.2* 48.0*  MCV 92.5  --   PLT 218  --     Cardiac Enzymes:  No results for input(s): CKTOTAL, CKMB, CKMBINDEX, TROPONINI in the last 168 hours.  Lipid Panel: No results for input(s): CHOL, TRIG, HDL, CHOLHDL, VLDL, LDLCALC in the last 168 hours.  CBG:  Recent Labs Lab 12/13/15 1313 12/13/15 1339  GLUCAP 135* 124*    Microbiology: No results found for this or any previous visit.  Coagulation Studies:  Recent Labs  12/13/15 1311  LABPROT 13.0  INR 0.98    Imaging: Dg Chest 2 View  Result Date: 12/13/2015 CLINICAL DATA:  Cough, shortness of breath. EXAM: CHEST  2 VIEW COMPARISON:  Radiographs of August 27, 2013. FINDINGS: Stable cardiomegaly. Stable central pulmonary vascular congestion is noted. Status post Coronary artery bypass graft. Status post right shoulder arthroplasty. No pneumothorax or pleural effusion is noted. Right lung is clear. Mild left lingular subsegmental atelectasis or scarring is noted. IMPRESSION: Stable cardiomegaly and central pulmonary vascular congestion. Mild left lingular subsegmental atelectasis or scarring is noted. Electronically Signed   By: Marijo Conception, M.D.   On: 12/13/2015 15:36  Ct Head Code Stroke W/o Cm  Result Date: 12/13/2015 CLINICAL DATA:  Dysphasia of acute onset EXAM: CT HEAD WITHOUT CONTRAST TECHNIQUE:  Contiguous axial images were obtained from the base of the skull through the vertex without intravenous contrast. COMPARISON:  March 05, 2006 FINDINGS: The ventricles are normal in size and configuration. There is no intracranial mass, hemorrhage, extra-axial fluid collection, or midline shift. There is slight small vessel disease in the centra semiovale bilaterally. Elsewhere gray-white compartments appear normal. No acute infarct is evident. There is no demonstrable hyperdense vessel on this study. There is calcification in both carotid siphon regions. The bony calvarium appears intact. There is opacification of several inferior mastoid air cells on the left. Most of the mastoid air cells are clear. There is mild mucosal thickening in several ethmoid air cells bilaterally. Visualized paranasal sinuses elsewhere are clear. No intraorbital lesions are evident. IMPRESSION: Mild periventricular small vessel disease. No intracranial mass, hemorrhage, or evidence of acute infarct. No hyperdense vessel. There are vascular calcifications in the carotid siphon regions bilaterally. There is opacification several ethmoid air cells as well as in the inferior left mastoid region. Critical Value/emergent results were called by telephone at the time of interpretation on 12/13/2015 at 1:44 pm to Dr. Jordan Hawks, neurology, who verbally acknowledged these results. Electronically Signed   By: Lowella Grip III M.D.   On: 12/13/2015 13:44   Assessment/Plan:  Quana is a pleasant 82 year old patient she presents with some mild word finding difficulties and a questionable slight right facial droop. She underwent CT of the brain that was unremarkable. On direct neurological evaluation she had no focal neurological deficits there is no evidence of a facial droop or any word finding difficulties.  Balinda was noted to have a systolic blood pressure in excess of 200. She has been off of her antihypertensive medications for over  a month. It was suspected that shown experienced transient encephalopathy related to hypertension. While ischemic change cannot be completely excluded based on the CT evaluation, Angelique did not meet the criteria for the administration of TPA given that her exam revealed no neurological deficits in the time of onset of symptoms was unknown.  While being evaluated in the emergency room after the CT, Female felt that her symptoms had resolved. Her case has been discussed with the medicine team. They plan to admit her for overnight observation with discharge in the morning.  Plan:  1. Agree with overnight observation with  discharge in the morning.  2. Should Remo Lipps experience any new neurological symptoms please reconsult the neurology service.  Thank you for allowing the neurology service to assist in the care of your patient!   Bellanie Matthew A. Tasia Catchings, M.D. Neurohospitalist Phone: [phone]  12/13/2015, 5:03 PM

## 2015-12-13 NOTE — H&P (Signed)
History and Physical    Lori Price [id number] DOB: [birth year] DOA: 12/13/2015   PCP: Glenda Chroman, MD   Patient coming from:  Home    Chief Complaint:  HPI: Lori Price is a 82 y.o. female with medical history significant for HLD, CAD, HTN poorly controlled, sent to the ER after presenting to Urgent care with "trouble finding words" worse since today (but present for at least 3 years)  As well as increasing trouble swallowing pills, causing her to "cough it out ". This is the first time that she presents to the ED with the symptoms. She denies any history of stroke. denies any chest pain, or shortness of breath. Denies any fever or chills, or night sweats.Deniess any  dizziness, nausea. Does not smoke, drink or partake recreational drugs. No new meds or hormonal supplements. Denies any recent long distance trips. Denies any headache or vision problems Of note, the patient has an appointment to see her cardiologist next week, and has run out of blood pressure medications for the last month, but has not been able to see her doctor until then. She admits to salty food intake. She is not very active. Takes daily baby aspirin   ED Course:  BP (!) 210/70   Pulse 74   Temp 98.4 F (36.9 C) (Oral)   Resp 15   Ht 5\' 5"  (1.651 m)   Wt 108 kg (238 lb)   SpO2 94%   BMI 39.61 kg/m    hemoglobin 16.4 glucose 137 BUNs 24, glucose in blood 138, Troponin 001 PT 13 INR 0.98 PTT 32 Review of Systems: As per HPI otherwise 10 point review of systems negative.   Past Medical History:  Diagnosis Date  . Asthma   . Carotid artery occlusion   . Chronic cough   . Complication of anesthesia    dental work  bp dropped  . Coronary artery disease    s/p MI 2001--(catheterizations in 2001 with left main 70% stenosis, LAD 70% followed by 90% stenosis, circumflex 99% stenosis, right coronary artery occluded.   . Hyperlipidemia   . Hypertension    dr Percival Spanish  . Nephrolithiasis   . Peripheral vascular  disease ([place])   . Sleep apnea    CPAP   no  . Vertigo     Past Surgical History:  Procedure Laterality Date  . CARDIAC CATHETERIZATION     2001  . CAROTID ENDARTERECTOMY Right    cea  Dr. Amedeo Plenty  . CARPAL TUNNEL RELEASE Right   . CORONARY ARTERY BYPASS GRAFT     2001 (LIMA to LAD, SVG to obtuse marginal, sequential  SVG to RV , marginal branch in the distal right coronary artery.  Marland Kitchen knee arthoscopy    . left carpal tunnel    . ORIF SHOULDER FRACTURE Right 08/30/2013   Procedure: HEMI ARTHROPLASTY;  Surgeon: [name], MD;  Location: [facility];  Service: Orthopedics;  Laterality: Right;  . right carotid endarterectomy    . TONSILLECTOMY      Social History Social History   Social History  . Marital status: Married    Spouse name: Lori Price  . Number of children: Lori Price  . Years of education: Lori Price   Occupational History  . Not on file.   Social History Main Topics  . Smoking status: Never Smoker  . Smokeless tobacco: Never Used  . Alcohol use No  . Drug use: No  . Sexual activity: Not on file   Other  Topics Concern  . Not on file   Social History Narrative  . No narrative on file     Allergies  Allergen Reactions  . Codeine Hives, Itching and Other (See Comments)    extreme agitation  . Losartan Other (See Comments)    "I can't walk, I can't do anything"   . Oxycodone Hives and Itching  . Penicillins Swelling and Rash    Has patient had a PCN reaction causing immediate rash, facial/tongue/throat swelling, SOB or lightheadedness with hypotension: NO Has patient had a PCN reaction causing severe rash involving mucus membranes or skin necrosis: NO Has patient had a PCN reaction that required hospitalization: NO  Has patient had a PCN reaction occurring within the last 10 years: NO If all of the above answers are "NO", then may proceed with Cephalosporin use.    Marland Kitchen Pentazocine Other (See Comments) and Nausea And Vomiting  . Quinapril Cough    REACTION: cough  .  Sibutramine Palpitations    REACTION: palpitations  . Sulfamethoxazole Nausea And Vomiting    hives  . Sulfonamide Derivatives Rash  . Ace Inhibitors Cough    cough  . Eggs Or Egg-Derived Products Hives, Diarrhea and Other (See Comments)    Pt stated "My esophagus swells, I get hives, diarrhea, and I pass out"  . Losartan Potassium     cough  . Other Other (See Comments)    EGGS: passes out, hives    Family History  Problem Relation Age of Onset  . Ovarian cancer Mother   . Cancer Mother     Ovarian  . COPD Father   . Aneurysm Father     Throat  . Kidney disease Father   . Heart disease Maternal Grandmother   . Heart disease Maternal Grandfather       Prior to Admission medications   Medication Sig Start Date End Date Taking? Authorizing Provider  acetaminophen (TYLENOL) 325 MG tablet Take 650 mg by mouth as needed.   Yes Historical Provider, MD  aspirin 81 MG tablet Take 81 mg by mouth daily.     Yes Historical Provider, MD  atenolol (TENORMIN) 50 MG tablet Take 1 tablet (50 mg total) by mouth daily. 12/01/13  Yes Minus Breeding, MD  beclomethasone (QVAR) 40 MCG/ACT inhaler Inhale 1 puff into the lungs 2 (two) times daily as needed (for breathing difficulty during allergery season).    Yes Historical Provider, MD  bisacodyl (DULCOLAX) 5 MG EC tablet Take 5 mg by mouth daily as needed for moderate constipation.    Yes Historical Provider, MD  fexofenadine (ALLEGRA) 180 MG tablet Take 180 mg by mouth daily as needed for allergies.    Yes Historical Provider, MD  folic acid (FOLVITE) A999333 MCG tablet Take 400 mcg by mouth daily.     Yes Historical Provider, MD  ibuprofen (ADVIL,MOTRIN) 200 MG tablet Take 800 mg by mouth every 6 (six) hours as needed.   Yes Historical Provider, MD  Mag Aspart-Potassium Aspart (ASPARTATE MG & K) 90-90 MG CAPS Take 1 capsule by mouth 3 (three) times a week. On Mon, Wed, Fri   Yes Historical Provider, MD  Magnesium 250 MG TABS Take 250 mg by mouth 2  (two) times daily.    Yes Historical Provider, MD  meclizine (ANTIVERT) 25 MG tablet Take 25 mg by mouth daily as needed for dizziness.    Yes Historical Provider, MD  Omega-3 Fatty Acids (FISH OIL) 1000 MG CAPS Take 1,000 mg by mouth  daily.    Yes Historical Provider, MD  triamterene-hydrochlorothiazide (DYAZIDE) 37.5-25 MG per capsule Take 1 capsule by mouth daily as needed (for fluid retension and swelling).   Yes Historical Provider, MD  HYDROcodone-acetaminophen (NORCO) 5-325 MG per tablet Take 1-2 tablets by mouth every 4 (four) hours as needed for moderate pain. Patient not taking: Reported on 11/01/2014 09/01/13   Netta Cedars, MD  methocarbamol (ROBAXIN) 500 MG tablet Take 1 tablet (500 mg total) by mouth 3 (three) times daily as needed for muscle spasms. Patient not taking: Reported on 12/13/2015 09/01/13   Netta Cedars, MD  pantoprazole (PROTONIX) 40 MG tablet Take 40 mg by mouth as needed.     Historical Provider, MD  simvastatin (ZOCOR) 40 MG tablet Take 1 tablet (40 mg total) by mouth 3 (three) times a week. Patient not taking: Reported on 11/07/2015 12/01/13   Minus Breeding, MD    Physical Exam:    Vitals:   12/13/15 1452 12/13/15 1455 12/13/15 1500 12/13/15 1515  BP: 169/79 186/83 (!) 170/104 (!) 210/70  Pulse: 63 62 66 74  Resp: 15 20 16 15   Temp:      TempSrc:      SpO2: 95% 92% 91% 94%  Weight:      Height:           Constitutional: NAD, calm, comfortable   Vitals:   12/13/15 1452 12/13/15 1455 12/13/15 1500 12/13/15 1515  BP: 169/79 186/83 (!) 170/104 (!) 210/70  Pulse: 63 62 66 74  Resp: 15 20 16 15   Temp:      TempSrc:      SpO2: 95% 92% 91% 94%  Weight:      Height:       Eyes: PERRL, lids and conjunctivae normal ENMT: Mucous membranes are moist. Posterior pharynx clear of any exudate or lesions.Normal dentition.  Neck: normal, supple, no masses, no thyromegaly Respiratory: clear to auscultation bilaterally, no wheezing, no crackles. Normal respiratory  effort. No accessory muscle use.  Cardiovascular: Regular rate and rhythm, no murmurs / rubs / gallops. No extremity edema. 2+ pedal pulses. No carotid bruits. RLE larger in diameter in the setting of prior vein harvest  Abdomen: no tenderness, no masses palpated. No hepatosplenomegaly. Bowel sounds positive.  Musculoskeletal: no clubbing / cyanosis. No joint deformity upper and lower extremities. Good ROM, no contractures. Normal muscle tone.  Skin: no rashes, lesions, ulcers.  Neurologic: CN 2-12 grossly intact. Sensation intact, DTR normal. Strength 5/5 in all 4.  Psychiatric: Normal judgment and insight. Alert and oriented x 3. Normal mood.     Labs on Admission: I have personally reviewed following labs and imaging studies  CBC:  Recent Labs Lab 12/13/15 1311 12/13/15 1323  WBC 7.2  --   NEUTROABS 4.6  --   HGB 16.4* 16.3*  HCT 49.2* 48.0*  MCV 92.5  --   PLT 218  --     Basic Metabolic Panel:  Recent Labs Lab 12/13/15 1311 12/13/15 1323  NA 138 140  K 4.4 4.6  CL 106 104  CO2 25  --   GLUCOSE 137* 138*  BUN 16 24*  CREATININE 0.69 0.60  CALCIUM 9.6  --     GFR: Estimated Creatinine Clearance: 67.4 mL/min (by C-G formula based on SCr of 0.8 mg/dL).  Liver Function Tests:  Recent Labs Lab 12/13/15 1311  AST 30  ALT 21  ALKPHOS 92  BILITOT 1.1  PROT 6.6  ALBUMIN 3.6   No results for input(s):  LIPASE, AMYLASE in the last 168 hours. No results for input(s): AMMONIA in the last 168 hours.  Coagulation Profile:  Recent Labs Lab 12/13/15 1311  INR 0.98    Cardiac Enzymes: No results for input(s): CKTOTAL, CKMB, CKMBINDEX, TROPONINI in the last 168 hours.  BNP (last 3 results) No results for input(s): PROBNP in the last 8760 hours.  HbA1C: No results for input(s): HGBA1C in the last 72 hours.  CBG:  Recent Labs Lab 12/13/15 1313 12/13/15 1339  GLUCAP 135* 124*    Lipid Profile: No results for input(s): CHOL, HDL, LDLCALC, TRIG,  CHOLHDL, LDLDIRECT in the last 72 hours.  Thyroid Function Tests: No results for input(s): TSH, T4TOTAL, FREET4, T3FREE, THYROIDAB in the last 72 hours.  Anemia Panel: No results for input(s): VITAMINB12, FOLATE, FERRITIN, TIBC, IRON, RETICCTPCT in the last 72 hours.  Urine analysis: No results found for: COLORURINE, APPEARANCEUR, LABSPEC, PHURINE, GLUCOSEU, HGBUR, BILIRUBINUR, KETONESUR, PROTEINUR, UROBILINOGEN, NITRITE, LEUKOCYTESUR  Sepsis Labs: @LABRCNTIP (procalcitonin:4,lacticidven:4) )No results found for this or any previous visit (from the past 240 hour(s)).   Radiological Exams on Admission: Dg Chest 2 View  Result Date: 12/13/2015 CLINICAL DATA:  Cough, shortness of breath. EXAM: CHEST  2 VIEW COMPARISON:  Radiographs of August 27, 2013. FINDINGS: Stable cardiomegaly. Stable central pulmonary vascular congestion is noted. Status post Coronary artery bypass graft. Status post right shoulder arthroplasty. No pneumothorax or pleural effusion is noted. Right lung is clear. Mild left lingular subsegmental atelectasis or scarring is noted. IMPRESSION: Stable cardiomegaly and central pulmonary vascular congestion. Mild left lingular subsegmental atelectasis or scarring is noted. Electronically Signed   By: Marijo Conception, M.D.   On: 12/13/2015 15:36  Ct Head Code Stroke W/o Cm  Result Date: 12/13/2015 CLINICAL DATA:  Dysphasia of acute onset EXAM: CT HEAD WITHOUT CONTRAST TECHNIQUE: Contiguous axial images were obtained from the base of the skull through the vertex without intravenous contrast. COMPARISON:  March 05, 2006 FINDINGS: The ventricles are normal in size and configuration. There is no intracranial mass, hemorrhage, extra-axial fluid collection, or midline shift. There is slight small vessel disease in the centra semiovale bilaterally. Elsewhere gray-white compartments appear normal. No acute infarct is evident. There is no demonstrable hyperdense vessel on this study. There is  calcification in both carotid siphon regions. The bony calvarium appears intact. There is opacification of several inferior mastoid air cells on the left. Most of the mastoid air cells are clear. There is mild mucosal thickening in several ethmoid air cells bilaterally. Visualized paranasal sinuses elsewhere are clear. No intraorbital lesions are evident. IMPRESSION: Mild periventricular small vessel disease. No intracranial mass, hemorrhage, or evidence of acute infarct. No hyperdense vessel. There are vascular calcifications in the carotid siphon regions bilaterally. There is opacification several ethmoid air cells as well as in the inferior left mastoid region. Critical Value/emergent results were called by telephone at the time of interpretation on 12/13/2015 at 1:44 pm to Dr. Jordan Hawks, neurology, who verbally acknowledged these results. Electronically Signed   By: Lowella Grip III M.D.   On: 12/13/2015 13:44   EKG: Independently reviewed.  Assessment/Plan Active Problems:   Coronary atherosclerosis   Cough   Obesity   TIA (transient ischemic attack)   Stroke-like symptoms   Stroke like symptoms including right facial droop, now resolved . Labs, EKG unrevealing. Neuro exam is essentially normal except for chronic /progressive problem  finding words and chronic problem with swallowing .No seizures noted. CT head negative. Appreciate Neuro input for TIA  workup BP was elevated, r/o PRES, as patient has not been taking her BP meds . Received Tenormin 50 mg x1 with some improvement of her BP.  Risk factors include CAD s/p CABG, HLD and obesity,  Stroke/TIA order set  observation - Tele bed. -2 D echo CXR pending A1C SLP    Hyperlipidemia Continue home statins Lipid panel   Hypertension BP (!) 210/70   Pulse 74, poorly controlled as patient ran out of meds 1 month ago   r/o PRES, as patient has not been taking her BP meds . Received Tenormin 50 mg x1 with some improvement of her  BP  Add Hydralazine Q6 hours as needed for BP 160/90   CAD s/p CAbG. EKG unrevealing , patient is cardiac pain free at this time. Continue ASA,   high dose statin, beta blockers    DVT prophylaxis: Lovenox  Code Status:   Full   Family Communication:  Discussed with daughter Disposition Plan: Expect patient to be discharged to home after condition improves Consults called:   Neuro Admission status:Tele  Obs    Pattiann Solanki E, PA-C [facility]   If 7PM-7AM, please contact night-coverage [email or web address] Password [email or web address]  12/13/2015, 4:21 PM

## 2015-12-13 NOTE — ED Triage Notes (Addendum)
Pt c/o stroke symptoms onset when she woke from a nap at 1100 today. She went to sleep feeling normal and when she woke a friend told her that her speech sounded slurred. Her daughter is here with her now and reports her face looks drooped. The patient is alert, breathing easily. Code stroke activate

## 2015-12-14 ENCOUNTER — Observation Stay (HOSPITAL_BASED_OUTPATIENT_CLINIC_OR_DEPARTMENT_OTHER): Payer: Medicare Other

## 2015-12-14 DIAGNOSIS — G459 Transient cerebral ischemic attack, unspecified: Secondary | ICD-10-CM | POA: Diagnosis not present

## 2015-12-14 DIAGNOSIS — I10 Essential (primary) hypertension: Secondary | ICD-10-CM | POA: Diagnosis not present

## 2015-12-14 DIAGNOSIS — R2990 Unspecified symptoms and signs involving the nervous system: Secondary | ICD-10-CM | POA: Diagnosis not present

## 2015-12-14 LAB — LIPID PANEL
CHOL/HDL RATIO: 7.8 ratio
Cholesterol: 243 mg/dL — ABNORMAL HIGH (ref 0–200)
HDL: 31 mg/dL — AB (ref >40)
LDL CALC: UNDETERMINED mg/dL (ref 0–99)
Triglycerides: 410 mg/dL — ABNORMAL HIGH (ref <150)
VLDL: UNDETERMINED mg/dL (ref 0–40)

## 2015-12-14 LAB — ECHOCARDIOGRAM COMPLETE
HEIGHTINCHES: 65 in
WEIGHTICAEL: 3808 [oz_av]

## 2015-12-14 MED ORDER — ATENOLOL 50 MG PO TABS: 50.0000 mg | ORAL_TABLET | Freq: Every day | ORAL | 0 refills | Status: DC

## 2015-12-14 MED ORDER — ATENOLOL 25 MG PO TABS
50.0000 mg | ORAL_TABLET | Freq: Once | ORAL | Status: AC
  Administered 2015-12-14: 50 mg via ORAL
  Filled 2015-12-14: qty 2

## 2015-12-14 MED ORDER — TRIAMTERENE-HCTZ 37.5-25 MG PO CAPS: 1.0000 | ORAL_CAPSULE | Freq: Every day | ORAL | 0 refills | Status: DC

## 2015-12-14 NOTE — Progress Notes (Signed)
  Echocardiogram 2D Echocardiogram has been performed.  Lori Price 12/14/2015, 11:00 AM

## 2015-12-14 NOTE — Evaluation (Signed)
Physical Therapy Evaluation Patient Details Name: Lori Price MRN: [id number] DOB: [birth year] Today's Date: 12/14/2015   History of Present Illness  Pt is an 82 y.o. female presenting with mild confusion and word finding difficulties. PMHx: HTN, Hyperlipidemia, Vertigo, and CAD. CT on 7/26 was unremarkable.  Clinical Impression  Patient is functioning at supervision to Mod I level with mobility and gait.  This is her baseline functional level.  Encouraged patient to use her RW at all times during gait for balance/safety.  No acute PT needs identified - PT will sign off.    Follow Up Recommendations No PT follow up;Supervision for mobility/OOB    Equipment Recommendations  None recommended by PT    Recommendations for Other Services       Precautions / Restrictions Precautions Precautions: Fall Restrictions Weight Bearing Restrictions: No      Mobility  Bed Mobility Overal bed mobility: Modified Independent             General bed mobility comments: Increased time  Transfers Overall transfer level: Needs assistance Equipment used: None;Straight cane Transfers: Sit to/from Stand Sit to Stand: Supervision         General transfer comment: Increased time.  Assist for safety.   Ambulation/Gait Ambulation/Gait assistance: Min guard Ambulation Distance (Feet): 56 Feet Assistive device: Straight cane Gait Pattern/deviations: Step-through pattern;Decreased stride length;Shuffle;Trunk flexed Gait velocity: decreased Gait velocity interpretation: Below normal speed for age/gender General Gait Details: Somewhat unsteady gait, with slow, guarded, shuffling steps.  Patient using cane, however states she uses RW at home.  Encouraged patient to use only RW for safety.  Stairs            Wheelchair Mobility    Modified Rankin (Stroke Patients Only) Modified Rankin (Stroke Patients Only) Pre-Morbid Rankin Score: Slight disability Modified Rankin: Slight  disability     Balance Overall balance assessment: Needs assistance         Standing balance support: Single extremity supported;During functional activity Standing balance-Leahy Scale: Fair                               Pertinent Vitals/Pain Pain Assessment: 0-10 Pain Score: 4  Pain Location: Rt Shoulder, Bil knees, back (chronic pain) Pain Descriptors / Indicators: Aching Pain Intervention(s): Monitored during session;Repositioned    Home Living Family/patient expects to be discharged to:: Private residence Living Arrangements: Spouse/significant other Available Help at Discharge: Family;Available 24 hours/day;Personal care attendant (Housekeeper/Aide 4 days/week ) Type of Home: House Home Access: Stairs to enter;Elevator Entrance Stairs-Rails: None Entrance Stairs-Number of Steps: 1 Home Layout: Two level;Bed/bath upstairs Management consultant to second floor) Home Equipment: Walker - 2 wheels;Cane - single point;Tub bench;Grab bars - toilet;Grab bars - tub/shower;Walker - 4 wheels (tall toilet)      Prior Function Level of Independence: Independent with assistive device(s)         Comments: RW for mobility. Independent with ADLs. Does not walk long distances.  Housekeeper runs errands.     Hand Dominance   Dominant Hand: Left    Extremity/Trunk Assessment   Upper Extremity Assessment: Defer to OT evaluation           Lower Extremity Assessment: Generalized weakness      Cervical / Trunk Assessment: Kyphotic  Communication   Communication: No difficulties (Daughter reports patient continues to have slight slurring. )  Cognition Arousal/Alertness: Awake/alert Behavior During Therapy: WFL for tasks assessed/performed Overall Cognitive Status: Within Functional Limits  for tasks assessed                      General Comments      Exercises        Assessment/Plan    PT Assessment Patent does not need any further PT services  PT  Diagnosis Abnormality of gait;Generalized weakness   PT Problem List    PT Treatment Interventions     PT Goals (Current goals can be found in the Care Plan section) Acute Rehab PT Goals Patient Stated Goal: To go home PT Goal Formulation: All assessment and education complete, DC therapy    Frequency     Barriers to discharge        Co-evaluation               End of Session Equipment Utilized During Treatment: Gait belt Activity Tolerance: Patient limited by fatigue Patient left: in bed;with call bell/phone within reach;with bed alarm set;with family/visitor present (Sitting EOB for lunch) Nurse Communication: Mobility status    Functional Assessment Tool Used: Clinical judgement Functional Limitation: Mobility: Walking and moving around Mobility: Walking and Moving Around Current Status [id number]): At least 1 percent but less than 20 percent impaired, limited or restricted Mobility: Walking and Moving Around Goal Status [phone]): At least 1 percent but less than 20 percent impaired, limited or restricted Mobility: Walking and Moving Around Discharge Status [phone]): At least 1 percent but less than 20 percent impaired, limited or restricted    Time: 1116-1140 PT Time Calculation (min) (ACUTE ONLY): 24 min   Charges:   PT Evaluation $PT Eval Moderate Complexity: 1 Procedure PT Treatments $Gait Training: 8-22 mins   PT G Codes:   PT G-Codes **NOT FOR INPATIENT CLASS** Functional Assessment Tool Used: Clinical judgement Functional Limitation: Mobility: Walking and moving around Mobility: Walking and Moving Around Current Status [id number]): At least 1 percent but less than 20 percent impaired, limited or restricted Mobility: Walking and Moving Around Goal Status [phone]): At least 1 percent but less than 20 percent impaired, limited or restricted Mobility: Walking and Moving Around Discharge Status [phone]): At least 1 percent but less than 20 percent impaired, limited or  restricted    Despina Pole 12/14/2015, 12:18 PM Carita Pian. Sanjuana Kava, [place] Pager [phone]

## 2015-12-14 NOTE — Progress Notes (Signed)
Discharge orders received.  Discharge instructions and follow-up appointments reviewed with the patient and her daughter.  VSS upon discharge.  IV removed and education complete.  Transported out via wheelchair.   Arayna Illescas M, RN 

## 2015-12-14 NOTE — Progress Notes (Signed)
Subjective:  Lori Price appears to be doing well this morning she has noted complaints she feels back to her baseline she reports no changes in her speech.  Exam: Vitals:   12/14/15 0436 12/14/15 0950  BP: (!) 183/82 (!) 145/76  Pulse: 74 68  Resp: 20 18  Temp: 98.2 F (36.8 C) 98.2 F (36.8 C)    HEENT-  Normocephalic, no lesions, without obvious abnormality.  Normal external eye and conjunctiva.  Normal TM's bilaterally.  Normal auditory canals and external ears. Normal external nose, mucus membranes and septum.  Normal pharynx. Cardiovascular- regular rate and rhythm, S1, S2 normal, no murmur, click, rub or gallop, pulses palpable throughout   Lungs- chest clear, no wheezing, rales, normal symmetric air entry, Heart exam - S1, S2 normal, no murmur, no gallop, rate regular Abdomen- soft, non-tender; bowel sounds normal; no masses,  no organomegaly Extremities- less then 2 second capillary refill Lymph-no adenopathy palpable Musculoskeletal-no joint tenderness, deformity or swelling Skin-warm and dry, no hyperpigmentation, vitiligo, or suspicious lesions    Gen: In bed, NAD MS: Alert and oriented CN: Cranial nerves II through XII are intact. There is no facial asymmetry. Motor: Moves all extremities with 5 out of 5 strength. There is no pronator drift. Sensory: Sensation is intact throughout. DTR: Reflexes are 1-2+ throughout.  Pertinent Labs/Diagnostics: Reviewed    Impression:   GEN presented yesterday with some difficulty with word finding. Her systolic blood pressure was over 200. She had been off her anti-hypertensive medications for over a month. It is likely that this resulted in a transient encephalopathy. An ischemic event cannot be completely ruled out based upon her negative CT. However her symptoms have presently resolved.   Recommendations: 1. Lori Price appears back to her baseline at this time.  2. Lori Price may be discharged home when cleared by the medicine  team.   Lori Price, M.D. Neurohospitalist Phone: [phone]   12/14/2015, 10:19 AM

## 2015-12-14 NOTE — Evaluation (Signed)
Clinical/Bedside Swallow Evaluation Patient Details  Name: Lori Price MRN: [id number] Date of Birth: 05/14/1932  Today's Date: 12/14/2015 Time: SLP Start Time (ACUTE ONLY): [id number] SLP Stop Time (ACUTE ONLY): 0836 SLP Time Calculation (min) (ACUTE ONLY): 19 min  Past Medical History:  Past Medical History:  Diagnosis Date  . Arthritis    oa  . Asthma   . BPV (benign positional vertigo)   . Carotid artery occlusion   . Chronic cough   . Complication of anesthesia    dental work  bp dropped  . Coronary artery disease    s/p MI 2001--(catheterizations in 2001 with left main 70% stenosis, LAD 70% followed by 90% stenosis, circumflex 99% stenosis, right coronary artery occluded.   . Hyperlipidemia   . Hypertension    dr Percival Spanish  . Nephrolithiasis   . Peripheral vascular disease ([place])   . Sleep apnea    CPAP   no  . Vertigo    Past Surgical History:  Past Surgical History:  Procedure Laterality Date  . CARDIAC CATHETERIZATION     2001  . CAROTID ENDARTERECTOMY Right    cea  Dr. Amedeo Plenty  . CARPAL TUNNEL RELEASE Right   . CORONARY ARTERY BYPASS GRAFT     2001 (LIMA to LAD, SVG to obtuse marginal, sequential  SVG to RV , marginal branch in the distal right coronary artery.  Marland Kitchen knee arthoscopy    . left carpal tunnel    . ORIF SHOULDER FRACTURE Right 08/30/2013   Procedure: HEMI ARTHROPLASTY;  Surgeon: [name], MD;  Location: [facility];  Service: Orthopedics;  Laterality: Right;  . right carotid endarterectomy    . TONSILLECTOMY     HPI:  82 y.o.femalewith medical history significant for HLD, CAD, HTN poorly controlled, chronic cough sent to the ER after presenting to Urgent care with "trouble finding words" worse since today (but present for at least 3 years), trouble swallowing pills, causing her to "cough it out ". CT no intracranial mass, hemorrhage, or evidence of acute infarct. CXR stable cardiomegaly and central pulmonary vascular congestion. Mild left lingular  subsegmental atelectasis or scarring is noted.   Assessment / Plan / Recommendation Clinical Impression  Pt report intermittent coughing with liquids (especially coffee) for approximately 10 years. She reports pna once 5 years ago. Immediate cough with one of 6 sips coffee prevented with chin tuck. SLP suspects decreased bolus cohesion and altered oral prep required with hot liquds. Pt denies GER; vocal quality appeared somewhat hoarse at baseline. Pt reports mucous primarily in the morning. SLP recommends regular texture diet, thin liquids with chin tuck when needed (coffee), small bites, straws allowed. Pt denied difficulty with pills. SLp educated consuming pills in puree if problems arise. No further ST needed.     Aspiration Risk   (mild-mod)    Diet Recommendation Regular;Thin liquid   Liquid Administration via: Cup;Straw Medication Administration: Whole meds with liquid Supervision: Patient able to self feed Compensations: Slow rate;Small sips/bites;Chin tuck (with liquids) Postural Changes: Seated upright at 90 degrees    Other  Recommendations Oral Care Recommendations: Oral care BID   Follow up Recommendations  None    Frequency and Duration            Prognosis        Swallow Study   General HPI: 82 y.o.femalewith medical history significant for HLD, CAD, HTN poorly controlled, chronic cough sent to the ER after presenting to Urgent care with "trouble finding words" worse since  today (but present for at least 3 years), trouble swallowing pills, causing her to "cough it out ". CT no intracranial mass, hemorrhage, or evidence of acute infarct. CXR stable cardiomegaly and central pulmonary vascular congestion. Mild left lingular subsegmental atelectasis or scarring is noted. Type of Study: Bedside Swallow Evaluation Previous Swallow Assessment:  (none) Diet Prior to this Study: NPO Temperature Spikes Noted: No Respiratory Status: Nasal cannula History of Recent  Intubation: No Behavior/Cognition: Alert;Cooperative;Pleasant mood Oral Cavity Assessment: Within Functional Limits Oral Care Completed by SLP: No Oral Cavity - Dentition: Adequate natural dentition Vision: Functional for self-feeding Self-Feeding Abilities: Able to feed self Patient Positioning: Upright in chair Baseline Vocal Quality:  (slight hoarseness) Volitional Cough: Strong Volitional Swallow: Able to elicit    Oral/Motor/Sensory Function Overall Oral Motor/Sensory Function: Within functional limits   Ice Chips Ice chips: Not tested   Thin Liquid Thin Liquid: Impaired Presentation: Cup;Straw Pharyngeal  Phase Impairments: Cough - Immediate    Nectar Thick Nectar Thick Liquid: Not tested   Honey Thick Honey Thick Liquid: Not tested   Puree Puree: Within functional limits   Solid   GO   Solid: Within functional limits    Functional Assessment Tool Used: skilled clinical judgement Functional Limitations: Swallowing Swallow Current Status [id number]): At least 20 percent but less than 40 percent impaired, limited or restricted Swallow Goal Status [phone]): At least 20 percent but less than 40 percent impaired, limited or restricted Swallow Discharge Status [phone]): At least 20 percent but less than 40 percent impaired, limited or restricted   Houston Siren 12/14/2015,8:52 AM Orbie Pyo Colvin Caroli.Ed [employer] [phone]

## 2015-12-14 NOTE — Discharge Summary (Signed)
Physician Discharge Summary  Lori Price [id number] DOB: [birth year] DOA: 12/13/2015  PCP: Glenda Chroman, MD  Admit date: 12/13/2015 Discharge date: 12/14/2015  Time spent: 35 minutes  Recommendations for Outpatient Follow-up:  1. Follow-up with PCP in one week   Discharge Diagnoses:  Active Problems:   Coronary atherosclerosis   Cough   Obesity   TIA (transient ischemic attack)   Stroke-like symptoms   Gastroesophageal reflux disease without esophagitis   BPV (benign positional vertigo)   Discharge Condition: Stable  Diet recommendation: Cardiac  Filed Weights   12/13/15 1346  Weight: 108 kg (238 lb)    History of present illness and Hospital Course:   Lori Price is an 82 y.o. female With history of hypertension, hyperlipidemia was brought to the emergency department for  some mild confusion and word finding difficulties that was noted this morning. She reports that a few friends thought that perhaps she might have experienced a slight right facial droop as well. [place] has a known history of hypertension hyperlipidemia and coronary artery disease. Ran out of her antihypertensive medications approximately a month ago. Upon arrival of the EMS team  noted to have 210/70. Had no obvious focal neurological deficits and no clear facial asymmetry but mild word finding difficulties during the neurological exam. Patient was started back on home medications with the Tenormin which includes a blood pressure significantly. Patient's symptoms were completely resolved. Neurology was consultation. If patient's symptoms are completely resolved felt no further workup is required. Felt this was from the mild hypertensive encephalopathy.   Discharge Exam: Vitals:   12/14/15 0950 12/14/15 1413  BP: (!) 145/76 (!) 168/78  Pulse: 68 70  Resp: 18 18  Temp: 98.2 F (36.8 C) 98.8 F (37.1 C)    General: Alert oriented Cardiovascular: S1-S2 regular Respiratory: Bilateral clear to auscultation    Discharge Instructions Recommended patient to follow-up with PCP in one week   Current Discharge Medication List    START taking these medications   Details  !! atenolol (TENORMIN) 50 MG tablet Take 1 tablet (50 mg total) by mouth daily. Qty: 90 tablet, Refills: 0     !! - Potential duplicate medications found. Please discuss with provider.    CONTINUE these medications which have CHANGED   Details  triamterene-hydrochlorothiazide (DYAZIDE) 37.5-25 MG capsule Take 1 each (1 capsule total) by mouth daily. Qty: 90 capsule, Refills: 0      CONTINUE these medications which have NOT CHANGED   Details  acetaminophen (TYLENOL) 325 MG tablet Take 650 mg by mouth as needed.    aspirin 81 MG tablet Take 81 mg by mouth daily.      !! atenolol (TENORMIN) 50 MG tablet Take 1 tablet (50 mg total) by mouth daily. Qty: 90 tablet, Refills: 3    beclomethasone (QVAR) 40 MCG/ACT inhaler Inhale 1 puff into the lungs 2 (two) times daily as needed (for breathing difficulty during allergery season).     bisacodyl (DULCOLAX) 5 MG EC tablet Take 5 mg by mouth daily as needed for moderate constipation.     fexofenadine (ALLEGRA) 180 MG tablet Take 180 mg by mouth daily as needed for allergies.     folic acid (FOLVITE) [id number] MCG tablet Take 400 mcg by mouth daily.      ibuprofen (ADVIL,MOTRIN) 200 MG tablet Take 800 mg by mouth every 6 (six) hours as needed.    Mag Aspart-Potassium Aspart (ASPARTATE MG & K) 90-90 MG CAPS Take 1 capsule by mouth  3 (three) times a week. On Mon, Wed, Fri    Magnesium 250 MG TABS Take 250 mg by mouth 2 (two) times daily.     meclizine (ANTIVERT) 25 MG tablet Take 25 mg by mouth daily as needed for dizziness.     Omega-3 Fatty Acids (FISH OIL) 1000 MG CAPS Take 1,000 mg by mouth daily.     HYDROcodone-acetaminophen (NORCO) 5-325 MG per tablet Take 1-2 tablets by mouth every 4 (four) hours as needed for moderate pain. Qty: 60 tablet, Refills: 0    methocarbamol  (ROBAXIN) 500 MG tablet Take 1 tablet (500 mg total) by mouth 3 (three) times daily as needed for muscle spasms. Qty: 60 tablet, Refills: 1    pantoprazole (PROTONIX) 40 MG tablet Take 40 mg by mouth as needed.     simvastatin (ZOCOR) 40 MG tablet Take 1 tablet (40 mg total) by mouth 3 (three) times a week. Qty: 50 tablet, Refills: 3     !! - Potential duplicate medications found. Please discuss with provider.     Allergies  Allergen Reactions  . Codeine Hives, Itching and Other (See Comments)    extreme agitation  . Losartan Other (See Comments)    "I can't walk, I can't do anything"   . Oxycodone Hives and Itching  . Penicillins Swelling and Rash    Has patient had a PCN reaction causing immediate rash, facial/tongue/throat swelling, SOB or lightheadedness with hypotension: NO Has patient had a PCN reaction causing severe rash involving mucus membranes or skin necrosis: NO Has patient had a PCN reaction that required hospitalization: NO  Has patient had a PCN reaction occurring within the last 10 years: NO If all of the above answers are "NO", then may proceed with Cephalosporin use.    Marland Kitchen Pentazocine Other (See Comments) and Nausea And Vomiting  . Quinapril Cough    REACTION: cough  . Sibutramine Palpitations    REACTION: palpitations  . Sulfamethoxazole Nausea And Vomiting    hives  . Sulfonamide Derivatives Rash  . Ace Inhibitors Cough    cough  . Eggs Or Egg-Derived Products Hives, Diarrhea and Other (See Comments)    Pt stated "My esophagus swells, I get hives, diarrhea, and I pass out"  . Losartan Potassium     cough  . Other Other (See Comments)    EGGS: passes out, hives      The results of significant diagnostics from this hospitalization (including imaging, microbiology, ancillary and laboratory) are listed below for reference.    Significant Diagnostic Studies: Dg Chest 2 View  Result Date: 12/13/2015 CLINICAL DATA:  Cough, shortness of breath. EXAM:  CHEST  2 VIEW COMPARISON:  Radiographs of August 27, 2013. FINDINGS: Stable cardiomegaly. Stable central pulmonary vascular congestion is noted. Status post Coronary artery bypass graft. Status post right shoulder arthroplasty. No pneumothorax or pleural effusion is noted. Right lung is clear. Mild left lingular subsegmental atelectasis or scarring is noted. IMPRESSION: Stable cardiomegaly and central pulmonary vascular congestion. Mild left lingular subsegmental atelectasis or scarring is noted. Electronically Signed   By: Marijo Conception, M.D.   On: 12/13/2015 15:36  Ct Head Code Stroke W/o Cm  Result Date: 12/13/2015 CLINICAL DATA:  Dysphasia of acute onset EXAM: CT HEAD WITHOUT CONTRAST TECHNIQUE: Contiguous axial images were obtained from the base of the skull through the vertex without intravenous contrast. COMPARISON:  March 05, 2006 FINDINGS: The ventricles are normal in size and configuration. There is no intracranial mass, hemorrhage, extra-axial  fluid collection, or midline shift. There is slight small vessel disease in the centra semiovale bilaterally. Elsewhere gray-white compartments appear normal. No acute infarct is evident. There is no demonstrable hyperdense vessel on this study. There is calcification in both carotid siphon regions. The bony calvarium appears intact. There is opacification of several inferior mastoid air cells on the left. Most of the mastoid air cells are clear. There is mild mucosal thickening in several ethmoid air cells bilaterally. Visualized paranasal sinuses elsewhere are clear. No intraorbital lesions are evident. IMPRESSION: Mild periventricular small vessel disease. No intracranial mass, hemorrhage, or evidence of acute infarct. No hyperdense vessel. There are vascular calcifications in the carotid siphon regions bilaterally. There is opacification several ethmoid air cells as well as in the inferior left mastoid region. Critical Value/emergent results were called  by telephone at the time of interpretation on 12/13/2015 at 1:44 pm to Dr. Jordan Hawks, neurology, who verbally acknowledged these results. Electronically Signed   By: Lowella Grip III M.D.   On: 12/13/2015 13:44   Microbiology: No results found for this or any previous visit (from the past 240 hour(s)).   Labs: Basic Metabolic Panel:  Recent Labs Lab 12/13/15 1311 12/13/15 1323  NA 138 140  K 4.4 4.6  CL 106 104  CO2 25  --   GLUCOSE 137* 138*  BUN 16 24*  CREATININE 0.69 0.60  CALCIUM 9.6  --    Liver Function Tests:  Recent Labs Lab 12/13/15 1311  AST 30  ALT 21  ALKPHOS 92  BILITOT 1.1  PROT 6.6  ALBUMIN 3.6   No results for input(s): LIPASE, AMYLASE in the last 168 hours. No results for input(s): AMMONIA in the last 168 hours. CBC:  Recent Labs Lab 12/13/15 1311 12/13/15 1323  WBC 7.2  --   NEUTROABS 4.6  --   HGB 16.4* 16.3*  HCT 49.2* 48.0*  MCV 92.5  --   PLT 218  --    Cardiac Enzymes: No results for input(s): CKTOTAL, CKMB, CKMBINDEX, TROPONINI in the last 168 hours. BNP: BNP (last 3 results) No results for input(s): BNP in the last 8760 hours.  ProBNP (last 3 results) No results for input(s): PROBNP in the last 8760 hours.  CBG:  Recent Labs Lab 12/13/15 1313 12/13/15 1339  GLUCAP 135* 124*       Signed:  Javari Bufkin MD.  [facility] 12/14/2015, 5:02 PM

## 2015-12-14 NOTE — Evaluation (Addendum)
Occupational Therapy Evaluation and Discharge  Patient Details Name: Lori Price MRN: [id number] DOB: [birth year] Today's Date: 12/14/2015    History of Present Illness Pt is an 82 y.o. female presenting with mild confusion and word finding difficulties. PMHx: HTN, Hyperlipidemia, Vertigo, and CAD. CT on 7/26 was unremarkable.   Clinical Impression   Pt reports she was independent with ADL PTA. Currently pt overall min guard for safety with ADL and functional mobility. Pt presenting with generalized bil UE weakness and decreased sensation in bil hands; pt reports all symptoms present PTA. Pt reports she feels that all her acute symptoms have resolved and she is currently at baseline. Pt planning to d/c home with adequate supervision from family. No further acute OT needs identified; signing off at this time. Please re-consult if needs change. Thank you for this referral.     Follow Up Recommendations  No OT follow up;Supervision - Intermittent    Equipment Recommendations  None recommended by OT    Recommendations for Other Services PT consult;Speech consult     Precautions / Restrictions Precautions Precautions: Fall Restrictions Weight Bearing Restrictions: No      Mobility Bed Mobility               General bed mobility comments: Pt OOB in chair upon arrival.  Transfers Overall transfer level: Needs assistance Equipment used: None;Straight cane Transfers: Sit to/from Stand Sit to Stand: Min guard         General transfer comment: Pt able to boost up from chair and steady self using SPC with min guard assist overall for safety.    Balance Overall balance assessment: Needs assistance Sitting-balance support: Feet supported;No upper extremity supported Sitting balance-Leahy Scale: Good     Standing balance support: Single extremity supported;During functional activity Standing balance-Leahy Scale: Fair                              ADL Overall  ADL's : Needs assistance/impaired Eating/Feeding: NPO   Grooming: Min guard;Standing   Upper Body Bathing: Supervision/ safety;Sitting   Lower Body Bathing: Min guard;Sit to/from stand   Upper Body Dressing : Supervision/safety   Lower Body Dressing: Minimal assistance;Sit to/from stand Lower Body Dressing Details (indicate cue type and reason): Assist to don sandals. Pt reports husband can assist as needed. Toilet Transfer: Min guard;Ambulation;Comfort height toilet;Grab bars [facility]) Toilet Transfer Details (indicate cue type and reason): Simulated by sit to stand from chair with short distance functional mobility in room. Toileting- Water quality scientist and Hygiene: Min guard;Sit to/from stand       Functional mobility during ADLs: Min guard;Cane General ADL Comments: Educated pt on home safety and fall prevention strategies; pt verbalized understanding and reports she is very aware of safety at home and does not want to fall again. Last fall was a couple years ago resulting in R shoulder injury. SpO2=90% on RA following functional mobility; reapplied supplemental O2 (RN notified).      Vision Additional Comments: Appears WFL.   Perception     Praxis      Pertinent Vitals/Pain Pain Assessment: 0-10 Pain Score: 4  Pain Descriptors / Indicators: Sore Pain Intervention(s): Monitored during session     Hand Dominance Left   Extremity/Trunk Assessment Upper Extremity Assessment Upper Extremity Assessment: RUE deficits/detail;Generalized weakness (Numbness in bil hands; present PTA) RUE Deficits / Details: Limited shoulder ROM from old injury ~3 years ago. RUE Sensation: decreased light touch RUE Coordination:  decreased fine motor   Lower Extremity Assessment Lower Extremity Assessment: Defer to PT evaluation   Cervical / Trunk Assessment Cervical / Trunk Assessment: Kyphotic   Communication Communication Communication: No difficulties   Cognition Arousal/Alertness:  Awake/alert Behavior During Therapy: WFL for tasks assessed/performed Overall Cognitive Status: Within Functional Limits for tasks assessed                     General Comments       Exercises       Shoulder Instructions      Home Living Family/patient expects to be discharged to:: Private residence Living Arrangements: Spouse/significant other Available Help at Discharge: Family;Available 24 hours/day Type of Home: House Home Access: Stairs to enter [employer] of Steps: 1   Home Layout: Two level;Bed/bath upstairs (has elevator to second floor)     Bathroom Shower/Tub: Tub/shower unit Shower/tub characteristics: Curtain Bathroom Toilet: Handicapped height     Home Equipment: Environmental consultant - 2 wheels;Cane - single point;Tub bench;Grab bars - toilet;Grab bars - tub/shower          Prior Functioning/Environment Level of Independence: Independent with assistive device(s)        Comments: RW/SPC for mobility. Independent with ADLs. Does not walk long distances.    OT Diagnosis: Generalized weakness;Acute pain   OT Problem List:     OT Treatment/Interventions:      OT Goals(Current goals can be found in the care plan section) Acute Rehab OT Goals Patient Stated Goal: get something to eat OT Goal Formulation: All assessment and education complete, DC therapy  OT Frequency:     Barriers to D/C:            Co-evaluation              End of Session Equipment Utilized During Treatment: Oxygen;Other (comment) [facility]) Nurse Communication: Mobility status;Other (comment) (SpO2)  Activity Tolerance: Patient tolerated treatment well Patient left: in chair;with call bell/phone within reach;with chair alarm set;with family/visitor present   Time: [id number] OT Time Calculation (min): 21 min Charges:  OT General Charges $OT Visit: 1 Procedure OT Evaluation $OT Eval Moderate Complexity: 1 Procedure G-Codes: OT G-codes **NOT FOR INPATIENT  CLASS** Functional Assessment Tool Used: Clinical judgement Functional Limitation: Self care Self Care Current Status [id number]): At least 1 percent but less than 20 percent impaired, limited or restricted Self Care Goal Status [id number]): At least 1 percent but less than 20 percent impaired, limited or restricted Self Care Discharge Status [phone]): At least 1 percent but less than 20 percent impaired, limited or restricted   Binnie Kand M.S., OTR/L Pager: [phone]  12/14/2015, 8:32 AM

## 2015-12-14 NOTE — Care Management Note (Signed)
Case Management Note  Patient Details  Name: Lori Price MRN: [id number] Date of Birth: January 09, 1932  Subjective/Objective:                    Action/Plan: Pt discharging home with self care. No further needs per CM.   Expected Discharge Date:                  Expected Discharge Plan:  Home/Self Care  In-House Referral:     Discharge planning Services     Post Acute Care Choice:    Choice offered to:     DME Arranged:    DME Agency:     HH Arranged:    HH Agency:     Status of Service:  In process, will continue to follow  If discussed at Long Length of Stay Meetings, dates discussed:    Additional Comments:  Pollie Friar, RN 12/14/2015, 3:42 PM

## 2015-12-14 NOTE — Care Management Obs Status (Signed)
[facility] NOTIFICATION   Patient Details  Name: Lori Price MRN: [id number] Date of Birth: 1931/06/06   Medicare Observation Status Notification Given:  Yes    Pollie Friar, RN 12/14/2015, 1:29 PM

## 2015-12-14 NOTE — Care Management Note (Signed)
Case Management Note  Patient Details  Name: Lori Price MRN: [id number] Date of Birth: [birth year]  Subjective/Objective:   Pt in to r/o TIA. She is from home with her spouse.                 Action/Plan: No f/u needs per PT/OT. CM following for further d/c needs.  Expected Discharge Date:                  Expected Discharge Plan:  Home/Self Care  In-House Referral:     Discharge planning Services     Post Acute Care Choice:    Choice offered to:     DME Arranged:    DME Agency:     HH Arranged:    HH Agency:     Status of Service:  In process, will continue to follow  If discussed at Long Length of Stay Meetings, dates discussed:    Additional Comments:  Pollie Friar, RN 12/14/2015, 3:23 PM

## 2015-12-15 LAB — HEMOGLOBIN A1C
Hgb A1c MFr Bld: 6 % — ABNORMAL HIGH (ref 4.8–5.6)
Mean Plasma Glucose: 126 mg/dL

## 2015-12-17 ENCOUNTER — Encounter: Payer: Self-pay | Admitting: Cardiology

## 2015-12-17 NOTE — Progress Notes (Signed)
Cardiology Office Note   Date:  12/18/2015   ID:  Lori Price, DOB 1931/10/21, MRN [id number]  PCP:  Glenda Chroman, MD  Cardiologist:   Minus Breeding, MD   Chief Complaint  Patient presents with  . Shortness of Breath      History of Present Illness: Lori Price is a 82 y.o. female who presents for  CAD/CABG.   I have not seen her since 2015. She was in the hospital in July of this year with a TIA.  He had a hypertensive urgency and I have reviewed these records.  As part of this evaluation she did have an echo which was unchanged from previous and listed below.  She comes with her daughter today.   Her daughters. Says that she is care of herself. The patient gets around with a walker because she has decreased balance. She's gained 17 pounds since I saw her a couple of years ago. She ran out of her medications and hadn't seen [place] in a couple of years. She stopped taking her cholesterol medicine because her joints hurt. She's had progressive shortness of breath. She slept on 3 pillows or in a recliner for years but her daughter says she's not short of breath with minimal activity. She's not describing any chest pressure, neck or arm discomfort. She's not having any palpitations, presyncope or syncope. She only wears her CPAP part of the night because she has trouble getting back on when she gets up to go the bathroom. Her daughter reports dietary indiscretion although the patient says she's not eating a lot not having sugar non-salt.  Past Medical History:  Diagnosis Date  . Arthritis    OA  . Asthma   . BPV (benign positional vertigo)   . Carotid artery occlusion   . Coronary artery disease    s/p MI 2001--(catheterizations in 2001 with left main 70% stenosis, LAD 70% followed by 90% stenosis, circumflex 99% stenosis, right coronary artery occluded.   . Hyperlipidemia   . Hypertension    Dr. Percival Spanish  . Nephrolithiasis   . Peripheral vascular disease ([place])   . Sleep apnea    CPAP    no  . Vertigo     Past Surgical History:  Procedure Laterality Date  . CARDIAC CATHETERIZATION     2001  . CAROTID ENDARTERECTOMY Right    cea  Dr. Amedeo Plenty  . CARPAL TUNNEL RELEASE Right   . CORONARY ARTERY BYPASS GRAFT     2001 (LIMA to LAD, SVG to obtuse marginal, sequential  SVG to RV , marginal branch in the distal right coronary artery.  Marland Kitchen knee arthoscopy    . left carpal tunnel    . ORIF SHOULDER FRACTURE Right 08/30/2013   Procedure: HEMI ARTHROPLASTY;  Surgeon: [name], MD;  Location: [place] [facility];  Service: Orthopedics;  Laterality: Right;  . right carotid endarterectomy    . TONSILLECTOMY       Current Outpatient Prescriptions  Medication Sig Dispense Refill  . acetaminophen (TYLENOL) 325 MG tablet Take 650 mg by mouth as needed.    Marland Kitchen aspirin 81 MG tablet Take 81 mg by mouth daily.      Marland Kitchen atenolol (TENORMIN) 50 MG tablet Take 1 tablet (50 mg total) by mouth daily. 90 tablet 3  . beclomethasone (QVAR) 40 MCG/ACT inhaler Inhale 1 puff into the lungs 2 (two) times daily as needed (for breathing difficulty during allergery season).     . bisacodyl (DULCOLAX)  5 MG EC tablet Take 5 mg by mouth daily as needed for moderate constipation.     . fexofenadine (ALLEGRA) 180 MG tablet Take 180 mg by mouth daily as needed for allergies.     . folic acid (FOLVITE) A999333 MCG tablet Take 400 mcg by mouth daily.      Marland Kitchen HYDROcodone-acetaminophen (NORCO) 5-325 MG per tablet Take 1-2 tablets by mouth every 4 (four) hours as needed for moderate pain. 60 tablet 0  . ibuprofen (ADVIL,MOTRIN) 200 MG tablet Take 800 mg by mouth every 6 (six) hours as needed.    [name] Aspart-Potassium Aspart (ASPARTATE MG & K) 90-90 MG CAPS Take 1 capsule by mouth 3 (three) times a week. On Mon, Wed, Fri    . Magnesium 250 MG TABS Take 250 mg by mouth 2 (two) times daily.     . meclizine (ANTIVERT) 25 MG tablet Take 25 mg by mouth daily as needed for dizziness.     . methocarbamol (ROBAXIN) 500 MG tablet Take 1  tablet (500 mg total) by mouth 3 (three) times daily as needed for muscle spasms. 60 tablet 1  . Omega-3 Fatty Acids (FISH OIL) 1000 MG CAPS Take 1,000 mg by mouth daily.     . pantoprazole (PROTONIX) 40 MG tablet Take 40 mg by mouth as needed.     . triamterene-hydrochlorothiazide (DYAZIDE) 37.5-25 MG capsule Take 1 each (1 capsule total) by mouth daily. 90 capsule 0  . pravastatin (PRAVACHOL) 80 MG tablet Take 1 tablet (80 mg total) by mouth every evening. 90 tablet 3   No current facility-administered medications for this visit.     Allergies:   Codeine; Losartan; Oxycodone; Penicillins; Pentazocine; Quinapril; Sibutramine; Sulfamethoxazole; Sulfonamide derivatives; Ace inhibitors; Eggs or egg-derived products; Losartan potassium; and Other   ROS:  Please see the history of present illness.   Otherwise, review of systems are positive for none.   All other systems are reviewed and negative.    PHYSICAL EXAM: VS:  BP (!) 148/78   Pulse 66   Ht 5\' 6"  (1.676 m)   Wt 239 lb 12.8 oz (108.8 kg)   BMI 38.70 kg/m  , BMI Body mass index is 38.7 kg/m. GENERAL:  Well appearing NECK:  No jugular venous distention, waveform within normal limits, carotid upstroke brisk and symmetric, bilateral bruits, no thyromegaly LYMPHATICS:  No cervical, inguinal adenopathy LUNGS:  Clear to auscultation bilaterally BACK:  No CVA tenderness CHEST: Well healed sternotomy scar. HEART:  PMI not displaced or sustained,S1 and S2 within normal limits, no S3, no S4, no clicks, no rubs, 2 out of 6 apical systolic murmur radiating slightly out the aortic outflow tract, no diastolic murmurs ABD:  Flat, positive bowel sounds normal in frequency in pitch, no bruits, no rebound, no guarding, no midline pulsatile mass, no hepatomegaly, no splenomegaly, obese EXT:  2 plus pulses throughout, no edema, no cyanosis no clubbing   EKG:  EKG is not ordered today. The ekg ordered 12/13/15 demonstrates Sinus rhythm, rate 66, axis  within normal limits, intervals within normal limits, premature ectopic complexes.   Recent Labs: 12/13/2015: ALT 21; BUN 24; Creatinine, Ser 0.60; Hemoglobin 16.3; Platelets 218; Potassium 4.6; Sodium 140    Lipid Panel    Component Value Date/Time   CHOL 243 (H) 12/14/2015 0542   TRIG 410 (H) 12/14/2015 0542   HDL 31 (L) 12/14/2015 0542   CHOLHDL 7.8 12/14/2015 0542   VLDL UNABLE TO CALCULATE IF TRIGLYCERIDE OVER 400 mg/dL 12/14/2015  0542   LDLCALC UNABLE TO CALCULATE IF TRIGLYCERIDE OVER 400 mg/dL 12/14/2015 0542   LDLDIRECT 111.8 02/03/2014 0924     Lab Results  Component Value Date   HGBA1C 6.0 (H) 12/14/2015    Wt Readings from Last 3 Encounters:  12/18/15 239 lb 12.8 oz (108.8 kg)  12/13/15 238 lb (108 kg)  11/07/15 238 lb (108 kg)    ECHO:   - Left ventricle: The cavity size was normal. There was severe   concentric hypertrophy. Systolic function was normal. The   estimated ejection fraction was in the range of 60% to 65%. Wall   motion was normal; there were no regional wall motion   abnormalities. Features are consistent with a pseudonormal left   ventricular filling pattern, with concomitant abnormal relaxation   and increased filling pressure (grade 2 diastolic dysfunction). - Aortic valve: Trileaflet; mildly thickened, mildly calcified   leaflets. Valve mobility was restricted. There was mild to   moderate stenosis. Peak velocity (S): 287 cm/s. Mean velocity   (S): 19 cm/s. - Mitral valve: Calcified annulus. Mildly thickened leaflets .   There was mild regurgitation. - Left atrium: The atrium was mildly dilated.    Other studies Reviewed: Additional studies/ records that were reviewed today include: Hospital records. . Review of the above records demonstrates:  Please see elsewhere in the note.     ASSESSMENT AND PLAN:  CAD:  Her last perfusion study was in 2014.  Since then she's had more shortness of breath. She's not taking care of herself. She  has significant risk factors. She needs stress testing but wouldn't be a walk on a treadmill. Therefore, she will have a [employer].  HTN:  The blood pressure is mildly elevated today. Marland Kitchen No change in medications is indicated. We will continue with therapeutic lifestyle changes (TLC).  CAROTID STENOSIS:  She is having this followed by vascular surgery.  DYSLIPIDEMIA:   She said she had muscle aches on Zocor. Change her Pravachol 80 mg daily. She Lipid profile 8 weeks.  MURMUR:  She had mild AS on echo during her TIA work up.  I will follow this clinically.  DYSPNEA:  I suspect this is multifactorial in part related to weight and deconditioning. I checked a stress test as above and a BNP level.  Current medicines are reviewed at length with the patient today.  The patient does not have concerns regarding medicines.  The following changes have been made:  As above  Labs/ tests ordered today include:   Orders Placed This Encounter  Procedures  . B Nat Peptide  . Myocardial Perfusion Imaging  . Split night study     Disposition:   FU with me in six weeks.     Signed, Minus Breeding, MD  12/18/2015 10:34 AM    [facility]

## 2015-12-18 ENCOUNTER — Encounter: Payer: Self-pay | Admitting: Cardiology

## 2015-12-18 ENCOUNTER — Ambulatory Visit (INDEPENDENT_AMBULATORY_CARE_PROVIDER_SITE_OTHER): Payer: Medicare Other | Admitting: Cardiology

## 2015-12-18 VITALS — BP 148/78 | HR 66 | Ht 66.0 in | Wt 239.8 lb

## 2015-12-18 DIAGNOSIS — G4730 Sleep apnea, unspecified: Secondary | ICD-10-CM

## 2015-12-18 DIAGNOSIS — I2510 Atherosclerotic heart disease of native coronary artery without angina pectoris: Secondary | ICD-10-CM | POA: Diagnosis not present

## 2015-12-18 DIAGNOSIS — I6521 Occlusion and stenosis of right carotid artery: Secondary | ICD-10-CM

## 2015-12-18 DIAGNOSIS — R0602 Shortness of breath: Secondary | ICD-10-CM | POA: Diagnosis not present

## 2015-12-18 LAB — BRAIN NATRIURETIC PEPTIDE: Brain Natriuretic Peptide: 257.7 pg/mL — ABNORMAL HIGH (ref <100)

## 2015-12-18 MED ORDER — PRAVASTATIN SODIUM 80 MG PO TABS: 80.0000 mg | ORAL_TABLET | Freq: Every evening | ORAL | 3 refills | Status: DC

## 2015-12-18 NOTE — Patient Instructions (Addendum)
Medication Instructions:  STOP Simvastatin and START Pravastatin 80 mg daily  Labwork: BNP  Testing/Procedures: Your physician has requested that you have a lexiscan myoview. For further information please visit [email or web address]. Please follow instruction sheet, as given.  Your physician has recommended that you have a sleep study. This test records several body functions during sleep, including: brain activity, eye movement, oxygen and carbon dioxide blood levels, heart rate and rhythm, breathing rate and rhythm, the flow of air through your mouth and nose, snoring, body muscle movements, and chest and belly movement.  Follow-Up: Your physician recommends that you schedule a follow-up appointment in: 6 Weeks in [place]  Any Other Special Instructions Will Be Listed Below (If Applicable).   If you need a refill on your cardiac medications before your next appointment, please call your pharmacy.

## 2015-12-19 ENCOUNTER — Telehealth: Payer: Self-pay | Admitting: *Deleted

## 2015-12-19 MED ORDER — FUROSEMIDE 20 MG PO TABS: 10.0000 mg | ORAL_TABLET | Freq: Every day | ORAL | 6 refills | Status: DC

## 2015-12-19 NOTE — Telephone Encounter (Signed)
-----   Message from Minus Breeding, MD sent at 12/18/2015  9:39 PM EDT ----- BNP is up slightly.  Start Lasix 10 mg po daily.  Disp number 31 with 3 refills.

## 2015-12-19 NOTE — Telephone Encounter (Signed)
Pt is aware of her blood work Lasix 10 mg was send into pt pharmacy, pt is aware of her medication changes, pt voice understanding.

## 2015-12-22 ENCOUNTER — Telehealth (HOSPITAL_COMMUNITY): Payer: Self-pay

## 2015-12-22 NOTE — Telephone Encounter (Signed)
Encounter complete. 

## 2015-12-27 ENCOUNTER — Ambulatory Visit (HOSPITAL_COMMUNITY)
Admission: RE | Admit: 2015-12-27 | Discharge: 2015-12-27 | Disposition: A | Discharge status: Home / Self Care | Payer: Medicare Other | Source: Ambulatory Visit | Attending: Cardiology | Admitting: Cardiology

## 2015-12-27 DIAGNOSIS — Z8249 Family history of ischemic heart disease and other diseases of the circulatory system: Secondary | ICD-10-CM | POA: Insufficient documentation

## 2015-12-27 DIAGNOSIS — I10 Essential (primary) hypertension: Secondary | ICD-10-CM | POA: Diagnosis not present

## 2015-12-27 DIAGNOSIS — E669 Obesity, unspecified: Secondary | ICD-10-CM | POA: Insufficient documentation

## 2015-12-27 DIAGNOSIS — R9439 Abnormal result of other cardiovascular function study: Secondary | ICD-10-CM | POA: Insufficient documentation

## 2015-12-27 DIAGNOSIS — R0609 Other forms of dyspnea: Secondary | ICD-10-CM | POA: Insufficient documentation

## 2015-12-27 DIAGNOSIS — R0602 Shortness of breath: Secondary | ICD-10-CM | POA: Insufficient documentation

## 2015-12-27 DIAGNOSIS — Z6838 Body mass index (BMI) 38.0-38.9, adult: Secondary | ICD-10-CM | POA: Insufficient documentation

## 2015-12-27 DIAGNOSIS — I6529 Occlusion and stenosis of unspecified carotid artery: Secondary | ICD-10-CM | POA: Diagnosis not present

## 2015-12-27 MED ORDER — TECHNETIUM TC 99M TETROFOSMIN IV KIT
31.9000 | PACK | Freq: Once | INTRAVENOUS | Status: DC | PRN
  Filled 2015-12-27: qty 32

## 2015-12-27 MED ORDER — AMINOPHYLLINE 25 MG/ML IV SOLN: 75.0000 mg | Freq: Once | INTRAVENOUS | Status: DC

## 2015-12-27 MED ORDER — REGADENOSON 0.4 MG/5ML IV SOLN: 0.4000 mg | Freq: Once | INTRAVENOUS | Status: DC

## 2015-12-28 ENCOUNTER — Ambulatory Visit (HOSPITAL_COMMUNITY)
Admission: RE | Admit: 2015-12-28 | Discharge: 2015-12-28 | Disposition: A | Discharge status: Home / Self Care | Payer: Medicare Other | Source: Ambulatory Visit | Attending: Cardiology | Admitting: Cardiology

## 2015-12-28 LAB — MYOCARDIAL PERFUSION IMAGING
CHL CUP NUCLEAR SSS: 10
LVDIAVOL: 89 mL (ref 46–106)
LVSYSVOL: 36 mL
Peak HR: 69 {beats}/min
Rest HR: 52 {beats}/min
SDS: 6
SRS: 4
TID: 0.91

## 2015-12-28 MED ORDER — TECHNETIUM TC 99M TETROFOSMIN IV KIT
30.6000 | PACK | Freq: Once | INTRAVENOUS | Status: AC | PRN
  Administered 2015-12-28: 31 via INTRAVENOUS

## 2016-01-31 ENCOUNTER — Ambulatory Visit (HOSPITAL_BASED_OUTPATIENT_CLINIC_OR_DEPARTMENT_OTHER): Payer: Medicare Other | Attending: Cardiology | Admitting: Cardiovascular Disease

## 2016-01-31 VITALS — Ht 67.0 in | Wt 240.0 lb

## 2016-01-31 DIAGNOSIS — G4733 Obstructive sleep apnea (adult) (pediatric): Secondary | ICD-10-CM | POA: Diagnosis not present

## 2016-01-31 DIAGNOSIS — I10 Essential (primary) hypertension: Secondary | ICD-10-CM | POA: Diagnosis not present

## 2016-01-31 DIAGNOSIS — G4730 Sleep apnea, unspecified: Secondary | ICD-10-CM

## 2016-01-31 DIAGNOSIS — E669 Obesity, unspecified: Secondary | ICD-10-CM | POA: Diagnosis not present

## 2016-01-31 DIAGNOSIS — G4761 Periodic limb movement disorder: Secondary | ICD-10-CM | POA: Insufficient documentation

## 2016-01-31 DIAGNOSIS — G4719 Other hypersomnia: Secondary | ICD-10-CM | POA: Insufficient documentation

## 2016-01-31 DIAGNOSIS — Z6838 Body mass index (BMI) 38.0-38.9, adult: Secondary | ICD-10-CM | POA: Insufficient documentation

## 2016-01-31 DIAGNOSIS — G4736 Sleep related hypoventilation in conditions classified elsewhere: Secondary | ICD-10-CM | POA: Diagnosis not present

## 2016-01-31 DIAGNOSIS — Z7982 Long term (current) use of aspirin: Secondary | ICD-10-CM | POA: Diagnosis not present

## 2016-01-31 DIAGNOSIS — R0683 Snoring: Secondary | ICD-10-CM | POA: Diagnosis not present

## 2016-01-31 DIAGNOSIS — Z79899 Other long term (current) drug therapy: Secondary | ICD-10-CM | POA: Insufficient documentation

## 2016-01-31 DIAGNOSIS — I493 Ventricular premature depolarization: Secondary | ICD-10-CM | POA: Insufficient documentation

## 2016-02-06 NOTE — Progress Notes (Signed)
Cardiology Office Note   Date:  02/07/2016   ID:  Lori Price, Lori Price [birth year], MRN [id number]  PCP:  Glenda Chroman, MD  Cardiologist:   Minus Breeding, MD   Chief Complaint  Patient presents with  . Irregular Heart Beat      History of Present Illness: Lori Price is a 82 y.o. female who presents for  CAD/CABG.    She was in the hospital in July of this year with a TIA.  He had a hypertensive urgency  As part of this evaluation she did have an echo which was unchanged from previous.  After her visit with me in July she had a stress perfusion study which a small inferior defect possibly a prior MI with mild ischemia.  The EF was 60%.  A BNP level was mildly elevated and I did start her on Lasix. She does think this helps her breathing. However, she comes with her daughter does report that she gets short of breath with minimal activity such as dressing. She slept upright in chairs for years for breathing and for other reasons. This is not different. She was able to travel recently and didn't have significant problems with this. She's not having any chest pressure, neck or arm discomfort. She does have some lower extremity swelling. Her weight has steadily increased over time.  Past Medical History:  Diagnosis Date  . Arthritis    OA  . Asthma   . BPV (benign positional vertigo)   . Carotid artery occlusion   . Coronary artery disease    s/p MI 2001--(catheterizations in 2001 with left main 70% stenosis, LAD 70% followed by 90% stenosis, circumflex 99% stenosis, right coronary artery occluded.   . Hyperlipidemia   . Hypertension    Dr. Percival Spanish  . Nephrolithiasis   . Peripheral vascular disease ([place])   . Sleep apnea    CPAP   no  . Vertigo     Past Surgical History:  Procedure Laterality Date  . CARDIAC CATHETERIZATION     2001  . CAROTID ENDARTERECTOMY Right    cea  Dr. Amedeo Plenty  . CARPAL TUNNEL RELEASE Right   . CORONARY ARTERY BYPASS GRAFT     2001 (LIMA to LAD, SVG to  obtuse marginal, sequential  SVG to RV , marginal branch in the distal right coronary artery.  Marland Kitchen knee arthoscopy    . left carpal tunnel    . ORIF SHOULDER FRACTURE Right 08/30/2013   Procedure: HEMI ARTHROPLASTY;  Surgeon: [name], MD;  Location: [facility];  Service: Orthopedics;  Laterality: Right;  . right carotid endarterectomy    . TONSILLECTOMY       Current Outpatient Prescriptions  Medication Sig Dispense Refill  . aspirin 81 MG tablet Take 81 mg by mouth daily.      Marland Kitchen atenolol (TENORMIN) 50 MG tablet Take 1 tablet (50 mg total) by mouth daily. 90 tablet 3  . beclomethasone (QVAR) 40 MCG/ACT inhaler Inhale 1 puff into the lungs 2 (two) times daily as needed (for breathing difficulty during allergery season).     . bisacodyl (DULCOLAX) 5 MG EC tablet Take 5 mg by mouth daily as needed for moderate constipation.     . fexofenadine (ALLEGRA) 180 MG tablet Take 180 mg by mouth daily as needed for allergies.     . folic acid (FOLVITE) A999333 MCG tablet Take 400 mcg by mouth daily.      . furosemide (LASIX) 20 MG  tablet Take 0.5 tablets (10 mg total) by mouth daily. 15 tablet 6  . HYDROcodone-acetaminophen (NORCO) 5-325 MG per tablet Take 1-2 tablets by mouth every 4 (four) hours as needed for moderate pain. 60 tablet 0  . ibuprofen (ADVIL,MOTRIN) 200 MG tablet Take 800 mg by mouth every 6 (six) hours as needed.    [name] Aspart-Potassium Aspart (ASPARTATE MG & K) 90-90 MG CAPS Take 1 capsule by mouth 3 (three) times a week. On Mon, Wed, Fri    . Magnesium 250 MG TABS Take 250 mg by mouth 2 (two) times daily.     . meclizine (ANTIVERT) 25 MG tablet Take 25 mg by mouth daily as needed for dizziness.     . Omega-3 Fatty Acids (FISH OIL) 1000 MG CAPS Take 1,000 mg by mouth daily.     . pantoprazole (PROTONIX) 40 MG tablet Take 40 mg by mouth as needed.     . pravastatin (PRAVACHOL) 80 MG tablet Take 1 tablet (80 mg total) by mouth every evening. 90 tablet 3  . triamterene-hydrochlorothiazide  (DYAZIDE) 37.5-25 MG capsule Take 1 each (1 capsule total) by mouth daily. 90 capsule 3   No current facility-administered medications for this visit.     Allergies:   Codeine; Losartan; Oxycodone; Penicillins; Pentazocine; Quinapril; Sibutramine; Sulfamethoxazole; Sulfonamide derivatives; Ace inhibitors; Eggs or egg-derived products; Losartan potassium; and Other   ROS:  Please see the history of present illness.   Otherwise, review of systems are positive for none.   All other systems are reviewed and negative.    PHYSICAL EXAM: VS:  BP 128/66   Pulse (!) 53   Ht 5\' 6"  (1.676 m)   Wt 243 lb (110.2 kg)   BMI 39.22 kg/m  , BMI Body mass index is 39.22 kg/m. GENERAL:  Well appearing NECK:  No jugular venous distention, waveform within normal limits, carotid upstroke brisk and symmetric, bilateral bruits, no thyromegaly LYMPHATICS:  No cervical, inguinal adenopathy LUNGS:  Clear to auscultation bilaterally BACK:  No CVA tenderness CHEST: Well healed sternotomy scar. HEART:  PMI not displaced or sustained,S1 and S2 within normal limits, no S3, no S4, no clicks, no rubs, 2 out of 6 apical systolic murmur radiating slightly out the aortic outflow tract, no diastolic murmurs ABD:  Flat, positive bowel sounds normal in frequency in pitch, no bruits, no rebound, no guarding, no midline pulsatile mass, no hepatomegaly, no splenomegaly, obese EXT:  2 plus pulses throughout, mild edema, no cyanosis no clubbing   EKG:  EKG is not not ordered today.   Recent Labs: 12/13/2015: ALT 21; BUN 24; Creatinine, Ser 0.60; Hemoglobin 16.3; Platelets 218; Potassium 4.6; Sodium 140 12/18/2015: Brain Natriuretic Peptide 257.7    Lipid Panel    Component Value Date/Time   CHOL 243 (H) 12/14/2015 0542   TRIG 410 (H) 12/14/2015 0542   HDL 31 (L) 12/14/2015 0542   CHOLHDL 7.8 12/14/2015 0542   VLDL UNABLE TO CALCULATE IF TRIGLYCERIDE OVER 400 mg/dL 12/14/2015 0542   LDLCALC UNABLE TO CALCULATE IF  TRIGLYCERIDE OVER 400 mg/dL 12/14/2015 0542   LDLDIRECT 111.8 02/03/2014 0924     Lab Results  Component Value Date   HGBA1C 6.0 (H) 12/14/2015    Wt Readings from Last 3 Encounters:  02/07/16 243 lb (110.2 kg)  01/31/16 240 lb (108.9 kg)  12/27/15 239 lb (108.4 kg)     Other studies Reviewed: Additional studies/ records that were reviewed today include: Hospital records. . Review of the above records  demonstrates:  Please see elsewhere in the note.     ASSESSMENT AND PLAN:  CAD: She had a low risk nuclear study.  No further cardiovascular testing is planned.  HTN:  The blood pressure is at target. No change in medications is indicated. We will continue with therapeutic lifestyle changes (TLC).  CAROTID STENOSIS:  She is having this followed by vascular surgery.  DYSLIPIDEMIA:    She will get a lipid profile.  MURMUR:  She had mild AS on echo during her TIA work up.  I will follow this clinically.  DYSPNEA:  I suspect this is multifactorial in part related to weight and deconditioning.  We talked at great length about this.  Current medicines are reviewed at length with the patient today.  The patient does not have concerns regarding medicines.  The following changes have been made:  None  Labs/ tests ordered today include:   Orders Placed This Encounter  Procedures  . Lipid Profile     Disposition:   FU with me in 6 months.   Signed, Minus Breeding, MD  02/07/2016 3:54 PM    [place]

## 2016-02-07 ENCOUNTER — Ambulatory Visit (INDEPENDENT_AMBULATORY_CARE_PROVIDER_SITE_OTHER): Payer: Medicare Other | Admitting: Cardiology

## 2016-02-07 ENCOUNTER — Encounter: Payer: Self-pay | Admitting: Cardiology

## 2016-02-07 VITALS — BP 128/66 | HR 53 | Ht 66.0 in | Wt 243.0 lb

## 2016-02-07 DIAGNOSIS — R0602 Shortness of breath: Secondary | ICD-10-CM | POA: Diagnosis not present

## 2016-02-07 DIAGNOSIS — I6521 Occlusion and stenosis of right carotid artery: Secondary | ICD-10-CM

## 2016-02-07 DIAGNOSIS — E785 Hyperlipidemia, unspecified: Secondary | ICD-10-CM

## 2016-02-07 MED ORDER — TRIAMTERENE-HCTZ 37.5-25 MG PO CAPS: 1.0000 | ORAL_CAPSULE | Freq: Every day | ORAL | 3 refills | Status: DC

## 2016-02-07 MED ORDER — ATENOLOL 50 MG PO TABS: 50.0000 mg | ORAL_TABLET | Freq: Every day | ORAL | 3 refills | Status: DC

## 2016-02-07 NOTE — Patient Instructions (Addendum)
Medication Instructions:  The current medical regimen is effective;  continue present plan and medications.  Please have fasting blood work at [facility] (Lipid)  Follow-Up: Follow up in 6 months with Dr. Percival Spanish.  You will receive a letter in the mail 2 months before you are due.  Please call us when you receive this letter to schedule your follow up appointment.  If you need a refill on your cardiac medications before your next appointment, please call your pharmacy.  Thank you for choosing [facility]!!

## 2016-02-08 ENCOUNTER — Other Ambulatory Visit: Payer: Medicare Other

## 2016-02-08 DIAGNOSIS — E785 Hyperlipidemia, unspecified: Secondary | ICD-10-CM | POA: Diagnosis not present

## 2016-02-09 LAB — LIPID PANEL
Chol/HDL Ratio: 3.9 ratio (ref 0.0–4.4)
Cholesterol, Total: 153 mg/dL (ref 100–199)
HDL: 39 mg/dL — ABNORMAL LOW
LDL Calculated: 71 mg/dL (ref 0–99)
Triglycerides: 213 mg/dL — ABNORMAL HIGH (ref 0–149)
VLDL Cholesterol Cal: 43 mg/dL — ABNORMAL HIGH (ref 5–40)

## 2016-02-11 NOTE — Procedures (Signed)
Patient Name: Lori Price, Shaff Date: 01/31/2016 Gender: Female D.O.B: [birth year] Age (years): 16 Referring Provider: Minus Breeding Height (inches): 58 Interpreting Physician: Shelva Majestic MD, ABSM Weight (lbs): 240 RPSGT: Laren Everts BMI: 51 MRN: [id number] Neck Size: 15.00  CLINICAL INFORMATION Sleep Study Type: NPSG Indication for sleep study: Excessive Daytime Sleepiness, Hypertension, Obesity, OSA, Re-Evaluation, Witnessed Apneas Epworth Sleepiness Score: 11  Most recent polysomnogram dated 01/03/2009 revealed an AHI of 15.8/h and RDI of 22.9/h. Most recent titration study dated 01/03/2009 was optimal at 13cm H2O with an AHI of 2.4/h.  SLEEP STUDY TECHNIQUE As per the AASM Manual for the Scoring of Sleep and Associated Events v2.3 (April 2016) with a hypopnea requiring 4% desaturations. The channels recorded and monitored were frontal, central and occipital EEG, electrooculogram (EOG), submentalis EMG (chin), nasal and oral airflow, thoracic and abdominal wall motion, anterior tibialis EMG, snore microphone, electrocardiogram, and pulse oximetry.  MEDICATIONS aspirin 81 MG tablet atenolol (TENORMIN) 50 MG tablet beclomethasone (QVAR) 40 MCG/ACT inhaler bisacodyl (DULCOLAX) 5 MG EC tablet fexofenadine (ALLEGRA) 99991111 MG tablet folic acid (FOLVITE) A999333 MCG tablet furosemide (LASIX) 20 MG tablet HYDROcodone-acetaminophen (NORCO) 5-325 MG per tablet ibuprofen (ADVIL,MOTRIN) 200 MG tablet Mag Aspart-Potassium Aspart (ASPARTATE MG & K) 90-90 MG CAPS Magnesium 250 MG TABS meclizine (ANTIVERT) 25 MG tablet Omega-3 Fatty Acids (FISH OIL) 1000 MG CAPS pantoprazole (PROTONIX) 40 MG tablet pravastatin (PRAVACHOL) 80 MG tablet triamterene-hydrochlorothiazide (DYAZIDE) 37.5-25 MG capsule  Medications self-administered by patient during sleep study : No sleep medicine administered.  SLEEP ARCHITECTURE The study was initiated at 10:05:21 PM and ended at 4:44:25 AM. Sleep  onset time was 7.6 minutes and the sleep efficiency was 75.9%. The total sleep time was 303.0 minutes. Stage REM latency was 62.0 minutes. The patient spent 7.92% of the night in stage N1 sleep, 71.29% in stage N2 sleep, 0.00% in stage N3 and 20.79% in REM. Alpha intrusion was absent. Supine sleep was 100.00%.  RESPIRATORY PARAMETERS The overall apnea/hypopnea index (AHI) was 8.7 per hour. There were 0 total apneas, including 0 obstructive, 0 central and 0 mixed apneas. There were 44 hypopneas and 17 RERAs. The AHI during Stage REM sleep was 32.4 per hour. AHI while supine was 8.7 per hour. The mean oxygen saturation was 87.90%. The minimum SpO2 during sleep was 79.00%. Soft snoring was noted during this study.  CARDIAC DATA The 2 lead EKG demonstrated sinus rhythm. The mean heart rate was 56.06 beats per minute. Other EKG findings include: PVCs and occasional to frequent PACs.  LEG MOVEMENT DATA The total PLMS were 380 with a resulting PLMS index of 75.25. Associated arousal with leg movement index was 3.8 .  IMPRESSIONS  - Mild obstructive sleep apnea  overall (AHI 8.7/h; RDI 12.1/h)); however, severe sleep apnea during REM sleep (AHI 32.4/h) - No significant central sleep apnea occurred during this study (CAI = 0.0/h). - Moderate oxygen desaturation to a nadir of 79.00%. - Abnormal sleep architecture with absence of stage 3 sleep. - The patient snored with Soft snoring volume. - EKG findings include PVCs. - Severe periodic limb movements of sleep  with a PLMS index of 75.25. No significant associated arousals.  DIAGNOSIS - Obstructive Sleep Apnea (327.23 [G47.33 ICD-10]) - Nocturnal Hypoxemia (327.26 [G47.36 ICD-10]) - Periodic Limb Movement Disorder of Sleep  RECOMMENDATIONS - Therapeutic CPAP titration to determine optimal pressure required to alleviate sleep disordered breathing. - Efforts should be made to optimize nasal and oropharyngeal patency. - Avoid alcohol, sedatives  and other  CNS depressants that may worsen sleep apnea and disrupt normal sleep architecture. - If patient is symptomatic with restless leg syndrome with CPAP therapy consider a trial of pharmocotherapy. - Sleep hygiene should be reviewed to assess factors that may improve sleep quality. - Weight management and regular exercise should be initiated or continued if appropriate.   [Electronically signed] 02/11/2016 08:00 PM  Shelva Majestic MD, [facility], [place], American Board of Sleep Medicine   NPI: [id number] PH: [phone]   FX: [phone] [place]

## 2016-02-13 ENCOUNTER — Telehealth: Payer: Self-pay | Admitting: *Deleted

## 2016-02-13 NOTE — Telephone Encounter (Signed)
Left message to return a call to discussSleep study results and recommendations.

## 2016-02-20 ENCOUNTER — Telehealth: Payer: Self-pay | Admitting: *Deleted

## 2016-02-20 DIAGNOSIS — G4730 Sleep apnea, unspecified: Secondary | ICD-10-CM

## 2016-02-20 NOTE — Progress Notes (Signed)
pt aware of results  CPAP titrations order placed

## 2016-02-20 NOTE — Telephone Encounter (Signed)
pt aware of results  CPAP titration order placed 

## 2016-02-20 NOTE — Telephone Encounter (Signed)
-----   Message from Lori Sine, MD sent at 02/11/2016 10:00 PM EDT ----- [name] please notify pt and schedule for CPAP titration

## 2016-02-21 ENCOUNTER — Telehealth: Payer: Self-pay | Admitting: Cardiovascular Disease

## 2016-02-21 NOTE — Telephone Encounter (Signed)
Called patient and scheduled CPAP titration for the last week of November.  Mailed out packet to the patient today.

## 2016-04-03 DIAGNOSIS — L720 Epidermal cyst: Secondary | ICD-10-CM | POA: Diagnosis not present

## 2016-04-05 DIAGNOSIS — L720 Epidermal cyst: Secondary | ICD-10-CM | POA: Diagnosis not present

## 2016-04-15 ENCOUNTER — Ambulatory Visit (HOSPITAL_BASED_OUTPATIENT_CLINIC_OR_DEPARTMENT_OTHER): Payer: Medicare Other | Attending: Cardiovascular Disease | Admitting: Cardiovascular Disease

## 2016-04-15 VITALS — Ht 67.5 in | Wt 245.0 lb

## 2016-04-15 DIAGNOSIS — G4761 Periodic limb movement disorder: Secondary | ICD-10-CM

## 2016-04-15 DIAGNOSIS — G4733 Obstructive sleep apnea (adult) (pediatric): Secondary | ICD-10-CM | POA: Diagnosis not present

## 2016-04-15 DIAGNOSIS — G4730 Sleep apnea, unspecified: Secondary | ICD-10-CM

## 2016-05-08 NOTE — Procedures (Signed)
Patient Name: Lori Price, Lori Price Date: 04/15/2016 Gender: Female D.O.B: 1931-12-25 Age (years): 69 Referring Provider: Minus Breeding Height (inches): 68 Interpreting Physician: Shelva Majestic MD, ABSM Weight (lbs): 245 RPSGT: Zadie Rhine BMI: 84 MRN: [id number] Neck Size: 15.00  CLINICAL INFORMATION The patient is referred for a CPAP titration to treat sleep apnea.  Date of NPSG, Split Night or HST: 01/31/2016:  AHI 8.7/hr; RDI 12.1/hr;  AHI during REM sleep 32.4/hr.  O2 desaturation to 79%.  SLEEP STUDY TECHNIQUE As per the AASM Manual for the Scoring of Sleep and Associated Events v2.3 (April 2016) with a hypopnea requiring 4% desaturations.  The channels recorded and monitored were frontal, central and occipital EEG, electrooculogram (EOG), submentalis EMG (chin), nasal and oral airflow, thoracic and abdominal wall motion, anterior tibialis EMG, snore microphone, electrocardiogram, and pulse oximetry. Continuous positive airway pressure (CPAP) was initiated at the beginning of the study and titrated to treat sleep-disordered breathing.  MEDICATIONS aspirin 81 MG tablet atenolol (TENORMIN) 50 MG tablet beclomethasone (QVAR) 40 MCG/ACT inhaler bisacodyl (DULCOLAX) 5 MG EC tablet fexofenadine (ALLEGRA) 99991111 MG tablet folic acid (FOLVITE) [id number] MCG tablet furosemide (LASIX) 20 MG tablet HYDROcodone-acetaminophen (NORCO) 5-325 MG per tablet ibuprofen (ADVIL,MOTRIN) 200 MG tablet Mag Aspart-Potassium Aspart (ASPARTATE MG & K) 90-90 MG CAPS Magnesium 250 MG TABS meclizine (ANTIVERT) 25 MG tablet Omega-3 Fatty Acids (FISH OIL) 1000 MG CAPS pantoprazole (PROTONIX) 40 MG tablet pravastatin (PRAVACHOL) 80 MG tablet (Expired) triamterene-hydrochlorothiazide (DYAZIDE) 37.5-25 MG capsule (Expired)  Medications self-administered by patient taken the night of the study : PRAVACHOL, ALLEGRA  TECHNICIAN COMMENTS Comments added by technician: TWO RESTROOM VISTED. Patient had  difficulty initiating sleep. Patient was restless all through the night.  Comments added by scorer: N/A  RESPIRATORY PARAMETERS Optimal PAP Pressure (cm): 9 AHI at Optimal Pressure (/hr): 0.0 Overall Minimal O2 (%): 76.00 Supine % at Optimal Pressure (%): 100 Minimal O2 at Optimal Pressure (%): 87.0    SLEEP ARCHITECTURE The study was initiated at 10:51:10 PM and ended at 5:04:09 AM.  Sleep onset time was 41.0 minutes and the sleep efficiency was 64.3%. The total sleep time was 240.0 minutes.  The patient spent 4.38% of the night in stage N1 sleep, 58.33% in stage N2 sleep, 18.13% in stage N3 and 19.17% in REM.Stage REM latency was 238.0 minutes  Wake after sleep onset was 92.0. Alpha intrusion was absent. Supine sleep was 100.00%.  CARDIAC DATA The 2 lead EKG demonstrated sinus rhythm. The mean heart rate was 56.55 beats per minute. Other EKG findings include: None.  LEG MOVEMENT DATA The total Periodic Limb Movements of Sleep (PLMS) were 586. The PLMS index was 146.52. A PLMS index of <15 is considered normal in adults.  IMPRESSIONS - The patient was titrated up to 9 cm water pressure.  At 9 cm of water: AHI 0; with O2 nadir at 87% with NREM and 90% with REM sleep. - Central sleep apnea was not noted during this titration (CAI = 0.0/h). - Severe oxygen desaturation to a nadir of 76.00% at  6 cm water pressure. - No snoring was audible during this study. - No cardiac abnormalities were observed during this study. - Severe periodic limb movements were observed during this study. Arousals associated with PLMs were significant.  DIAGNOSIS - Obstructive Sleep Apnea (327.23 [G47.33 ICD-10]) -Periodic Limb Movement Disorder  RECOMMENDATIONS - Recommend an initial trial of CPAP therapy with EPR at 10 cm H2O with  heated humidification.  A Medium size Resmed Full  Face Mask AirFit F20 mask was used for the titration. - With severe PLMS index of  146.52 consider a trial of pharmacotherapy  with possibly requip, mirapex, gabapentin, etc.  for treatment of restless legs. - Avoid alcohol, sedatives and other CNS depressants that may worsen sleep apnea and disrupt normal sleep architecture. - Sleep hygiene should be reviewed to assess factors that may improve sleep quality. - Weight management and regular exercise should be initiated or continued. - Recommend a download in 30 days and  sleep clinic evaluation.   [Electronically signed] 05/08/2016 08:48 AM  Shelva Majestic MD, [facility], [place], American Board of Sleep Medicine  NPI: [id number]  [place] PH: [phone]   FX: [phone] [place]

## 2016-05-09 ENCOUNTER — Telehealth: Payer: Self-pay | Admitting: *Deleted

## 2016-05-09 ENCOUNTER — Other Ambulatory Visit: Payer: Self-pay | Admitting: *Deleted

## 2016-05-09 DIAGNOSIS — G4733 Obstructive sleep apnea (adult) (pediatric): Secondary | ICD-10-CM

## 2016-05-09 DIAGNOSIS — Z9989 Dependence on other enabling machines and devices: Principal | ICD-10-CM

## 2016-05-09 NOTE — Telephone Encounter (Signed)
Left message sleep study positive for sleep apnea. Orders will be sent to [employer] to set up therapy.

## 2016-05-09 NOTE — Telephone Encounter (Signed)
-----   Message from Troy Sine, MD sent at 05/08/2016  8:55 AM EST ----- [name]; please set up with DME for CPAP set up and sleep eval

## 2016-05-09 NOTE — Progress Notes (Signed)
Left message order will be sent to [facility].

## 2016-08-06 NOTE — Progress Notes (Deleted)
Cardiology Office Note   Date:  08/06/2016   ID:  Lori, Price March 03, 1932, MRN [id number]  PCP:  Glenda Chroman, MD  Cardiologist:   Minus Breeding, MD   No chief complaint on file.     History of Present Illness: Lori Price is a 83 y.o. female who presents for  CAD/CABG.    She was in the hospital in July of last year with a TIA.  He had a hypertensive urgency  As part of this evaluation she did have an echo which was unchanged from previous.  After her visit with me in July she had a stress perfusion study which a small inferior defect possibly a prior MI with mild ischemia.  The EF was 60%.  A BNP level was mildly elevated and I did start her on Lasix.  She returns for follow up.  ***    She does think this helps her breathing. However, she comes with her daughter does report that she gets short of breath with minimal activity such as dressing. She slept upright in chairs for years for breathing and for other reasons. This is not different. She was able to travel recently and didn't have significant problems with this. She's not having any chest pressure, neck or arm discomfort. She does have some lower extremity swelling. Her weight has steadily increased over time.  Past Medical History:  Diagnosis Date  . Arthritis    OA  . Asthma   . BPV (benign positional vertigo)   . Carotid artery occlusion   . Coronary artery disease    s/p MI 2001--(catheterizations in 2001 with left main 70% stenosis, LAD 70% followed by 90% stenosis, circumflex 99% stenosis, right coronary artery occluded.   . Hyperlipidemia   . Hypertension    Dr. Percival Spanish  . Nephrolithiasis   . Peripheral vascular disease ([place])   . Sleep apnea    CPAP   no  . Vertigo     Past Surgical History:  Procedure Laterality Date  . CARDIAC CATHETERIZATION     2001  . CAROTID ENDARTERECTOMY Right    cea  Dr. Amedeo Plenty  . CARPAL TUNNEL RELEASE Right   . CORONARY ARTERY BYPASS GRAFT     2001 (LIMA to LAD, SVG to  obtuse marginal, sequential  SVG to RV , marginal branch in the distal right coronary artery.  Marland Kitchen knee arthoscopy    . left carpal tunnel    . ORIF SHOULDER FRACTURE Right 08/30/2013   Procedure: HEMI ARTHROPLASTY;  Surgeon: [name], MD;  Location: [place];  Service: Orthopedics;  Laterality: Right;  . right carotid endarterectomy    . TONSILLECTOMY       Current Outpatient Prescriptions  Medication Sig Dispense Refill  . aspirin 81 MG tablet Take 81 mg by mouth daily.      Marland Kitchen atenolol (TENORMIN) 50 MG tablet Take 1 tablet (50 mg total) by mouth daily. 90 tablet 3  . beclomethasone (QVAR) 40 MCG/ACT inhaler Inhale 1 puff into the lungs 2 (two) times daily as needed (for breathing difficulty during allergery season).     . bisacodyl (DULCOLAX) 5 MG EC tablet Take 5 mg by mouth daily as needed for moderate constipation.     . fexofenadine (ALLEGRA) 180 MG tablet Take 180 mg by mouth daily as needed for allergies.     . folic acid (FOLVITE) 416 MCG tablet Take 400 mcg by mouth daily.      Marland Kitchen  furosemide (LASIX) 20 MG tablet Take 0.5 tablets (10 mg total) by mouth daily. 15 tablet 6  . HYDROcodone-acetaminophen (NORCO) 5-325 MG per tablet Take 1-2 tablets by mouth every 4 (four) hours as needed for moderate pain. 60 tablet 0  . ibuprofen (ADVIL,MOTRIN) 200 MG tablet Take 800 mg by mouth every 6 (six) hours as needed.    [name] Aspart-Potassium Aspart (ASPARTATE MG & K) 90-90 MG CAPS Take 1 capsule by mouth 3 (three) times a week. On Mon, Wed, Fri    . Magnesium 250 MG TABS Take 250 mg by mouth 2 (two) times daily.     . meclizine (ANTIVERT) 25 MG tablet Take 25 mg by mouth daily as needed for dizziness.     . Omega-3 Fatty Acids (FISH OIL) 1000 MG CAPS Take 1,000 mg by mouth daily.     . pantoprazole (PROTONIX) 40 MG tablet Take 40 mg by mouth as needed.     . pravastatin (PRAVACHOL) 80 MG tablet Take 1 tablet (80 mg total) by mouth every evening. 90 tablet 3  . triamterene-hydrochlorothiazide  (DYAZIDE) 37.5-25 MG capsule Take 1 each (1 capsule total) by mouth daily. 90 capsule 3   No current facility-administered medications for this visit.     Allergies:   Codeine; Losartan; Oxycodone; Penicillins; Pentazocine; Quinapril; Sibutramine; Sulfamethoxazole; Sulfonamide derivatives; Ace inhibitors; Eggs or egg-derived products; Losartan potassium; and Other   ROS:  Please see the history of present illness.   Otherwise, review of systems are positive for none.   All other systems are reviewed and negative.    PHYSICAL EXAM: VS:  There were no vitals taken for this visit. , BMI There is no height or weight on file to calculate BMI. GENERAL:  Well appearing NECK:  No jugular venous distention, waveform within normal limits, carotid upstroke brisk and symmetric, bilateral bruits, no thyromegaly LYMPHATICS:  No cervical, inguinal adenopathy LUNGS:  Clear to auscultation bilaterally BACK:  No CVA tenderness CHEST: Well healed sternotomy scar. HEART:  PMI not displaced or sustained,S1 and S2 within normal limits, no S3, no S4, no clicks, no rubs, 2 out of 6 apical systolic murmur radiating slightly out the aortic outflow tract, no diastolic murmurs ABD:  Flat, positive bowel sounds normal in frequency in pitch, no bruits, no rebound, no guarding, no midline pulsatile mass, no hepatomegaly, no splenomegaly, obese EXT:  2 plus pulses throughout, mild edema, no cyanosis no clubbing   EKG:  EKG is not ***ordered today.   Recent Labs: 12/13/2015: ALT 21; BUN 24; Creatinine, Ser 0.60; Hemoglobin 16.3; Platelets 218; Potassium 4.6; Sodium 140 12/18/2015: Brain Natriuretic Peptide 257.7    Lipid Panel    Component Value Date/Time   CHOL 153 02/08/2016 0852   TRIG 213 (H) 02/08/2016 0852   HDL 39 (L) 02/08/2016 0852   CHOLHDL 3.9 02/08/2016 0852   CHOLHDL 7.8 12/14/2015 0542   VLDL UNABLE TO CALCULATE IF TRIGLYCERIDE OVER 400 mg/dL 12/14/2015 0542   LDLCALC 71 02/08/2016 0852    LDLDIRECT 111.8 02/03/2014 0924     Lab Results  Component Value Date   HGBA1C 6.0 (H) 12/14/2015    Wt Readings from Last 3 Encounters:  04/15/16 245 lb (111.1 kg)  02/07/16 243 lb (110.2 kg)  01/31/16 240 lb (108.9 kg)     Other studies Reviewed: Additional studies/ records that were reviewed today include: *** Review of the above records demonstrates:  ***   ASSESSMENT AND PLAN:  CAD:   Since her last  stress test ***She had a low risk nuclear study.  No further cardiovascular testing is planned.  HTN:  The blood pressure is at target. No change in medications is indicated. We will continue with therapeutic lifestyle changes (TLC).  CAROTID STENOSIS:  She is having this followed by vascular surgery.  DYSLIPIDEMIA:    ***  MURMUR:  She had mild AS on echo ***  DYSPNEA:  I suspect this is multifactorial in part related to weight and deconditioning.  We talked at great length about this.  Current medicines are reviewed at length with the patient today.  The patient does not have concerns regarding medicines.  The following changes have been made:  ***  Labs/ tests ordered today include:  ***  No orders of the defined types were placed in this encounter.    Disposition:   FU with me in *** months.   Signed, Minus Breeding, MD  08/06/2016 9:17 PM    [facility]

## 2016-08-07 ENCOUNTER — Encounter: Payer: Self-pay | Admitting: Cardiology

## 2016-08-07 ENCOUNTER — Ambulatory Visit: Payer: Medicare Other | Admitting: Cardiology

## 2016-08-07 ENCOUNTER — Ambulatory Visit (INDEPENDENT_AMBULATORY_CARE_PROVIDER_SITE_OTHER): Payer: Medicare Other | Admitting: Cardiology

## 2016-08-07 VITALS — BP 110/64 | HR 56 | Ht 66.5 in | Wt 240.0 lb

## 2016-08-07 DIAGNOSIS — I779 Disorder of arteries and arterioles, unspecified: Secondary | ICD-10-CM

## 2016-08-07 DIAGNOSIS — I10 Essential (primary) hypertension: Secondary | ICD-10-CM | POA: Diagnosis not present

## 2016-08-07 DIAGNOSIS — E7800 Pure hypercholesterolemia, unspecified: Secondary | ICD-10-CM

## 2016-08-07 DIAGNOSIS — G4730 Sleep apnea, unspecified: Secondary | ICD-10-CM | POA: Diagnosis not present

## 2016-08-07 DIAGNOSIS — I739 Peripheral vascular disease, unspecified: Secondary | ICD-10-CM

## 2016-08-07 MED ORDER — FUROSEMIDE 20 MG PO TABS: 10.0000 mg | ORAL_TABLET | Freq: Every day | ORAL | 3 refills | Status: DC

## 2016-08-07 MED ORDER — TRIAMTERENE-HCTZ 37.5-25 MG PO CAPS: 1.0000 | ORAL_CAPSULE | Freq: Every day | ORAL | 3 refills | Status: DC

## 2016-08-07 MED ORDER — PRAVASTATIN SODIUM 40 MG PO TABS: 40.0000 mg | ORAL_TABLET | Freq: Every evening | ORAL | 3 refills | Status: DC

## 2016-08-07 NOTE — Progress Notes (Signed)
Cardiology Office Note   Date:  08/08/2016   ID:  Zanayah, Shadowens 1931-08-02, MRN [id number]  PCP:  Glenda Chroman, MD  Cardiologist:   Minus Breeding, MD   Chief Complaint  Patient presents with  . Coronary Artery Disease      History of Present Illness: Lori Price is a 83 y.o. female who presents for  CAD/CABG.    She was in the hospital in July of last year with a TIA.  She had a hypertensive urgency  As part of this evaluation she did have an echo which was unchanged from previous.  After her visit with me in July she had a stress perfusion study which a small inferior defect possibly a prior MI with mild ischemia.  The EF was 60%.  A BNP level was mildly elevated and I did start her on Lasix.  She returns for follow up.  She says she's doing well. I do note she had a sleep study and she had severe oxygen desaturations and severe periodic limb movements. However, there were some scheduling issues and she never had her CPAP. She really hasn't wanted to pursue this. She's had no new cardiovascular symptoms such as chest pressure, neck or arm discomfort. She's had no new palpitations, presyncope or syncope. She's not describing PND or orthopnea. She stopped taking her statin because she thought her muscles were reviewed with this.   Past Medical History:  Diagnosis Date  . Arthritis    OA  . Asthma   . BPV (benign positional vertigo)   . Carotid artery occlusion   . Coronary artery disease    s/p MI 2001--(catheterizations in 2001 with left main 70% stenosis, LAD 70% followed by 90% stenosis, circumflex 99% stenosis, right coronary artery occluded.   . Hyperlipidemia   . Hypertension    Dr. Percival Spanish  . Nephrolithiasis   . Peripheral vascular disease ([place])   . Sleep apnea    CPAP   no  . Vertigo     Past Surgical History:  Procedure Laterality Date  . CARDIAC CATHETERIZATION     2001  . CAROTID ENDARTERECTOMY Right    cea  Dr. Amedeo Plenty  . CARPAL TUNNEL RELEASE Right   .  CORONARY ARTERY BYPASS GRAFT     2001 (LIMA to LAD, SVG to obtuse marginal, sequential  SVG to RV , marginal branch in the distal right coronary artery.  Marland Kitchen knee arthoscopy    . left carpal tunnel    . ORIF SHOULDER FRACTURE Right 08/30/2013   Procedure: HEMI ARTHROPLASTY;  Surgeon: [name], MD;  Location: [place];  Service: Orthopedics;  Laterality: Right;  . right carotid endarterectomy    . TONSILLECTOMY       Current Outpatient Prescriptions  Medication Sig Dispense Refill  . aspirin 81 MG tablet Take 81 mg by mouth daily.      Marland Kitchen atenolol (TENORMIN) 50 MG tablet Take 1 tablet (50 mg total) by mouth daily. 90 tablet 3  . beclomethasone (QVAR) 40 MCG/ACT inhaler Inhale 1 puff into the lungs 2 (two) times daily as needed (for breathing difficulty during allergery season).     . bisacodyl (DULCOLAX) 5 MG EC tablet Take 5 mg by mouth daily as needed for moderate constipation.     . fexofenadine (ALLEGRA) 180 MG tablet Take 180 mg by mouth daily as needed for allergies.     . folic acid (FOLVITE) 182 MCG tablet Take 400 mcg by  mouth daily.      . furosemide (LASIX) 20 MG tablet Take 0.5 tablets (10 mg total) by mouth daily. 45 tablet 3  . ibuprofen (ADVIL,MOTRIN) 200 MG tablet Take 800 mg by mouth every 6 (six) hours as needed.    . Magnesium 250 MG TABS Take 250 mg by mouth 2 (two) times daily.     . meclizine (ANTIVERT) 25 MG tablet Take 25 mg by mouth daily as needed for dizziness.     . Omega-3 Fatty Acids (FISH OIL) 1000 MG CAPS Take 1,000 mg by mouth daily.     Marland Kitchen triamterene-hydrochlorothiazide (DYAZIDE) 37.5-25 MG capsule Take 1 each (1 capsule total) by mouth daily. 90 capsule 3  . HYDROcodone-acetaminophen (NORCO) 5-325 MG per tablet Take 1-2 tablets by mouth every 4 (four) hours as needed for moderate pain. (Patient not taking: Reported on 08/07/2016) 60 tablet 0  . Mag Aspart-Potassium Aspart (ASPARTATE MG & K) 90-90 MG CAPS Take 1 capsule by mouth 3 (three) times a week. On  Mon, Wed, Fri    . pravastatin (PRAVACHOL) 40 MG tablet Take 1 tablet (40 mg total) by mouth every evening. 90 tablet 3   No current facility-administered medications for this visit.     Allergies:   Codeine; Losartan; Oxycodone; Penicillins; Pentazocine; Quinapril; Sibutramine; Sulfamethoxazole; Sulfonamide derivatives; Ace inhibitors; Eggs or egg-derived products; Losartan potassium; and Other   ROS:  Please see the history of present illness.   Otherwise, review of systems are positive for none.   All other systems are reviewed and negative.    PHYSICAL EXAM: VS:  BP 110/64   Pulse (!) 56   Ht 5' 6.5" (1.689 m)   Wt 240 lb (108.9 kg)   BMI 38.16 kg/m  , BMI Body mass index is 38.16 kg/m. GENERAL:  Well appearing NECK:  No jugular venous distention, waveform within normal limits, carotid upstroke brisk and symmetric, bilateral bruits, no thyromegaly LYMPHATICS:  No cervical, inguinal adenopathy LUNGS:  Clear to auscultation bilaterally BACK:  No CVA tenderness CHEST: Well healed sternotomy scar. HEART:  PMI not displaced or sustained,S1 and S2 within normal limits, no S3, no S4, no clicks, no rubs, 2 out of 6 apical systolic murmur radiating slightly out the aortic outflow tract, no diastolic murmurs ABD:  Flat, positive bowel sounds normal in frequency in pitch, no bruits, no rebound, no guarding, no midline pulsatile mass, no hepatomegaly, no splenomegaly, obese EXT:  2 plus pulses throughout, mild edema, no cyanosis no clubbing   EKG:  EKG is ordered today. Sinus rhythm, rate 57, axis within normal limits, intervals within normal limits, no acute ST-T wave changes.  Recent Labs: 12/13/2015: ALT 21; BUN 24; Creatinine, Ser 0.60; Hemoglobin 16.3; Platelets 218; Potassium 4.6; Sodium 140 12/18/2015: Brain Natriuretic Peptide 257.7    Lipid Panel    Component Value Date/Time   CHOL 153 02/08/2016 0852   TRIG 213 (H) 02/08/2016 0852   HDL 39 (L) 02/08/2016 0852   CHOLHDL 3.9  02/08/2016 0852   CHOLHDL 7.8 12/14/2015 0542   VLDL UNABLE TO CALCULATE IF TRIGLYCERIDE OVER 400 mg/dL 12/14/2015 0542   LDLCALC 71 02/08/2016 0852   LDLDIRECT 111.8 02/03/2014 0924     Lab Results  Component Value Date   HGBA1C 6.0 (H) 12/14/2015    Wt Readings from Last 3 Encounters:  08/07/16 240 lb (108.9 kg)  04/15/16 245 lb (111.1 kg)  02/07/16 243 lb (110.2 kg)     Other studies Reviewed: Additional studies/ records  that were reviewed today include: Sleep study Review of the above records demonstrates:   See elsewhere    ASSESSMENT AND PLAN:  CAD:   Since her last stress test she has had no new chest pain. She had a low risk nuclear study.  No further cardiovascular testing is planned.  HTN:  The blood pressure is at target. No change in medications is indicated. We will continue with therapeutic lifestyle  changes (TLC).  No change in meds  SLEEP APNEA:   I convinced her to follow-up for treatment of her sleep apnea and we provided the number today.  CAROTID STENOSIS:  She is having this followed by vascular surgery.   She will continue with risk reduction.  DYSLIPIDEMIA:    I convinced her to try pravastatin 40 mg of Lasix given her vascular disease and other risk factors.  She will get her lipids checked again in 8 weeks.    MURMUR:  She had mild AS on echo .  No further imaging is indicated.    Current medicines are reviewed at length with the patient today.  The patient does not have concerns regarding medicines.  The following changes have been made:  As above  Labs/ tests ordered today include:    Orders Placed This Encounter  Procedures  . Lipid panel  . EKG 12-Lead     Disposition:   FU with me in 12 months.   Signed, Minus Breeding, MD  08/08/2016 5:46 PM    [facility]

## 2016-08-07 NOTE — Patient Instructions (Signed)
Medication Instructions:  Please start Pravastatin 40 mg a day. Continue all other medications as listed.  Please have blood work in 8 weeks (Oct 02, 2016) Lipid panel  Follow-Up: Follow up in 1 year with Dr. Percival Spanish.  You will receive a letter in the mail 2 months before you are due.  Please call us when you receive this letter to schedule your follow up appointment.  If you need a refill on your cardiac medications before your next appointment, please call your pharmacy.  Thank you for choosing [facility]!!     Lori Price [phone]

## 2016-08-08 ENCOUNTER — Ambulatory Visit: Payer: Medicare Other | Admitting: Cardiovascular Disease

## 2016-08-08 ENCOUNTER — Encounter: Payer: Self-pay | Admitting: Cardiology

## 2016-09-01 IMAGING — CT CT HEAD CODE STROKE
3 series · 14 of 47 positions shown, 16 images · non-contrast
Comparison: March 05, 2006

CLINICAL DATA: Dysphasia of acute onset

EXAM:
CT HEAD WITHOUT CONTRAST
TECHNIQUE: Contiguous axial images were obtained from the base of the skull
through the vertex without intravenous contrast.

[Series 2: head 5.0 st · axial · 0.46mm/px · z∈[-117,+8]mm · 8 of 30 slices shown, 10 images]
[im 3/30  brain]
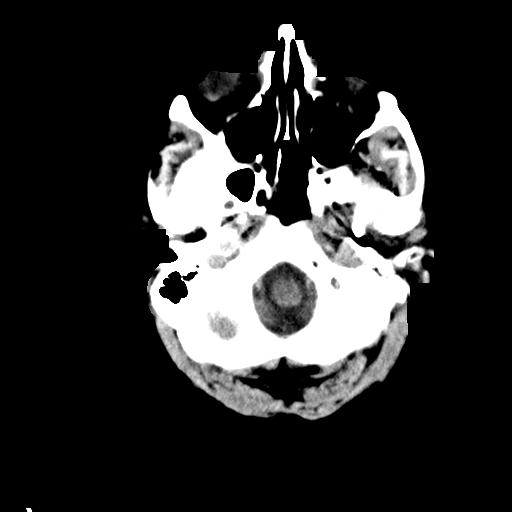
[im 3/30  bone]
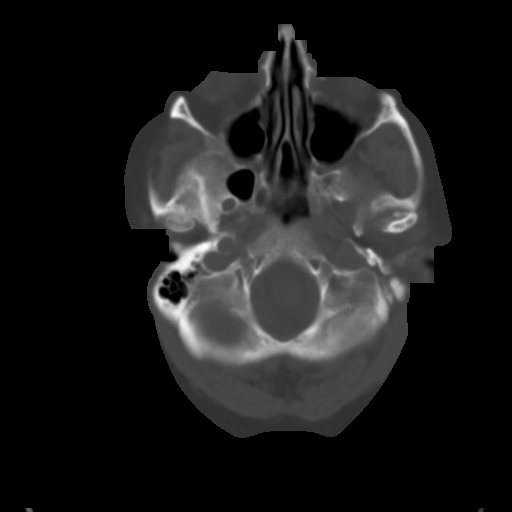
[im 7/30  brain]
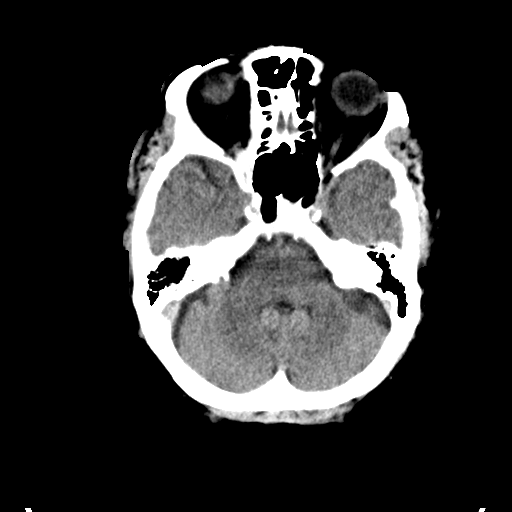
[im 10/30  brain]
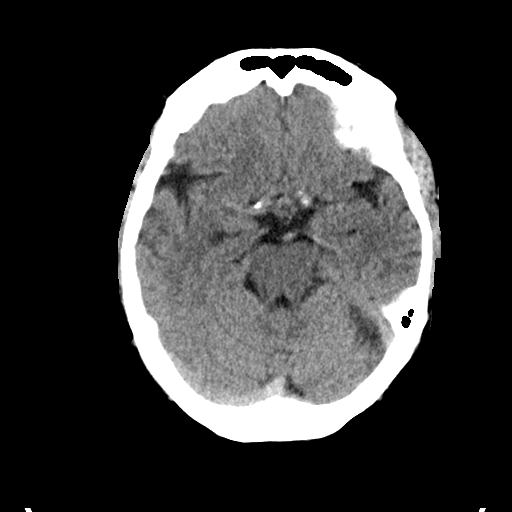
[im 14/30  brain]
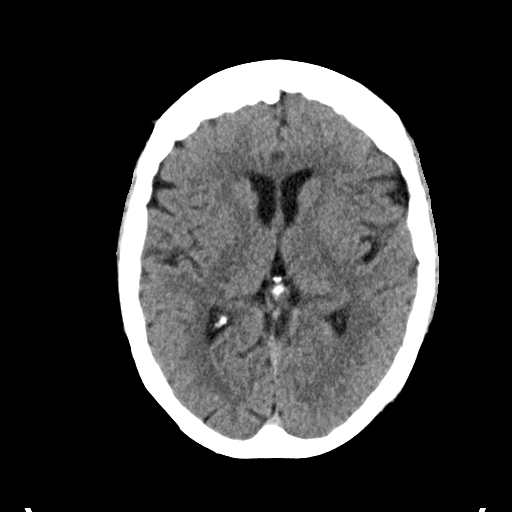
[im 17/30  brain]
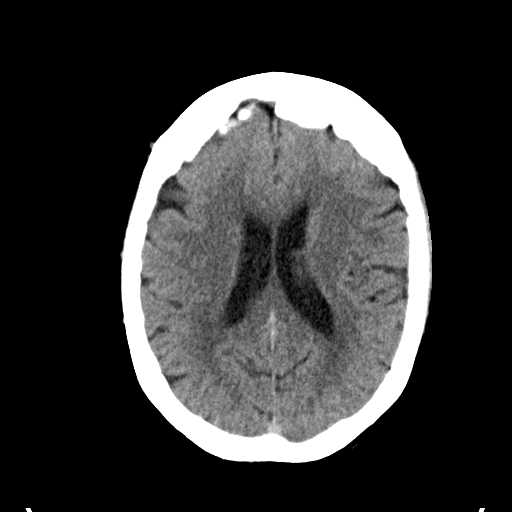
[im 17/30  bone]
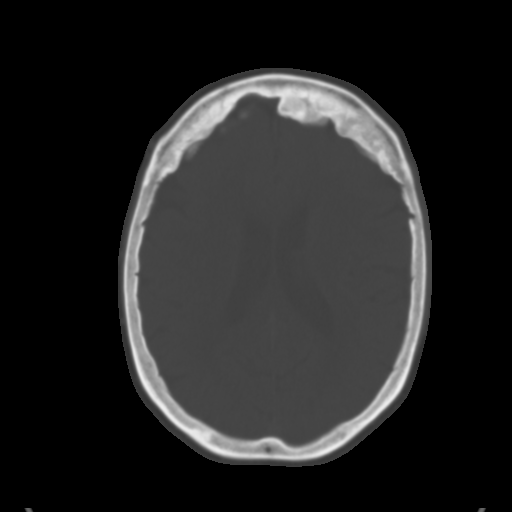
[im 21/30  brain]
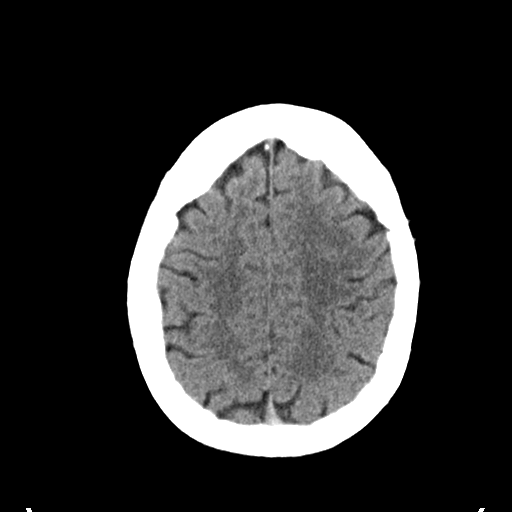
[im 24/30  brain]
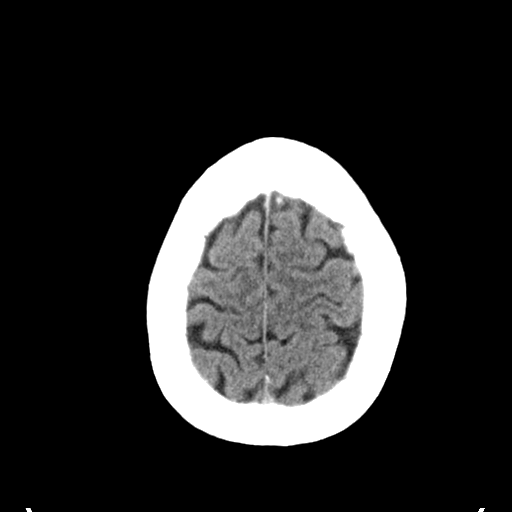
[im 28/30  brain]
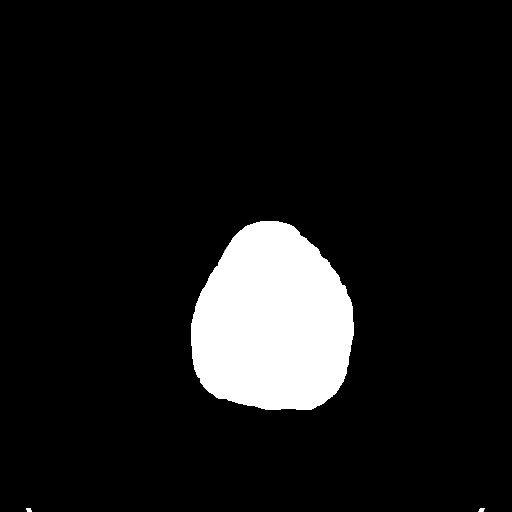

[Series 4: head 3.0 cor st · coronal · 0.29mm/px · 3 of 60 slices shown]
[im 20/60  brain]
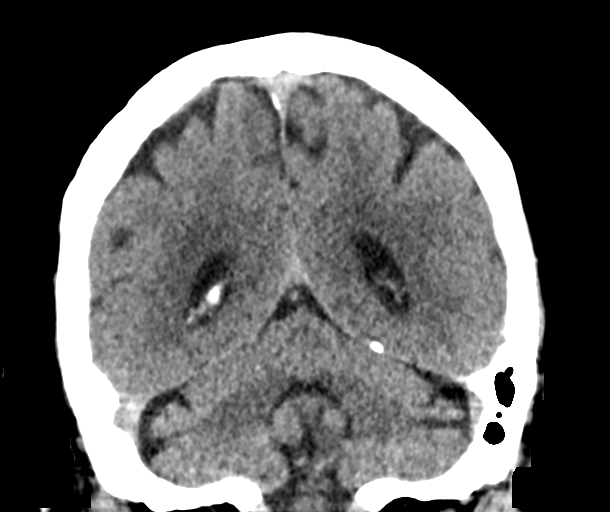
[im 27/60  brain]
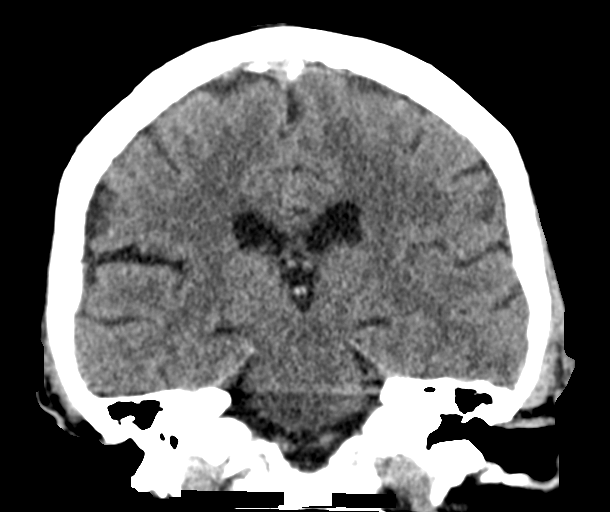
[im 33/60  brain]
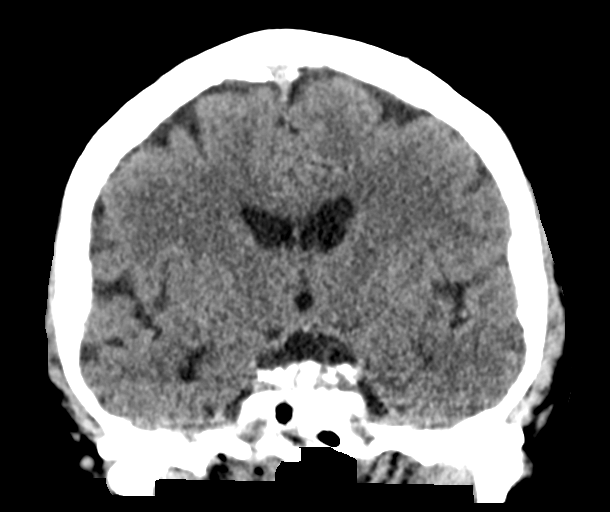

[Series 5: head 3.0 sag st · sagittal · 0.30mm/px · 3 of 51 slices shown]
[im 17/51  brain]
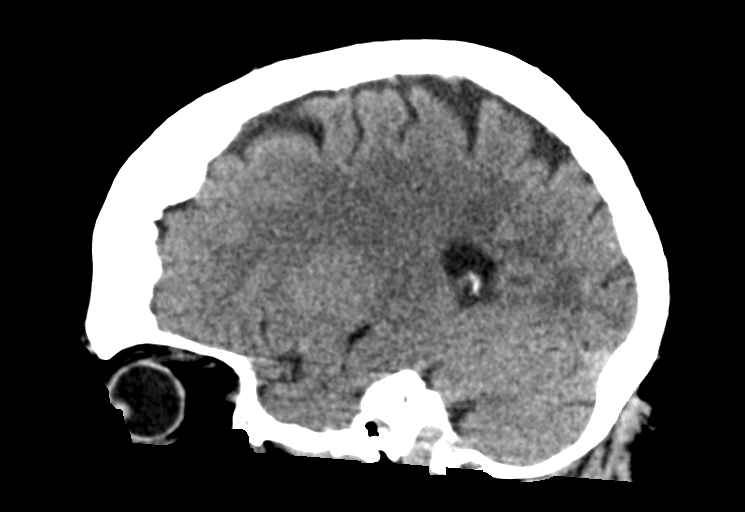
[im 26/51  brain]
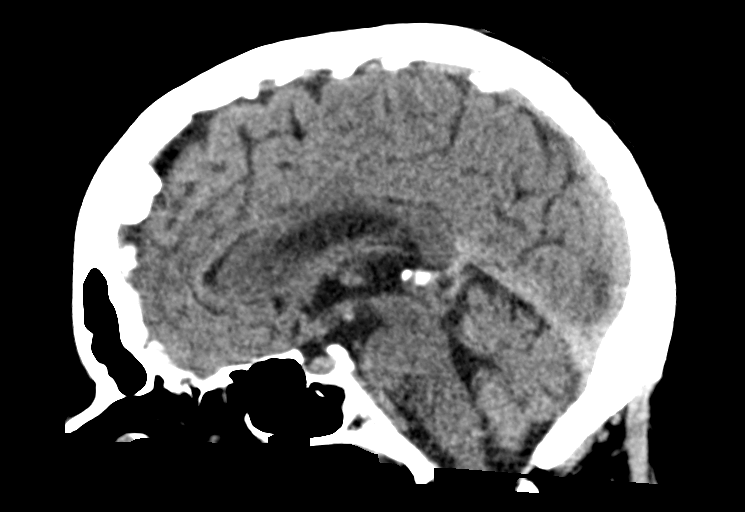
[im 34/51  brain]
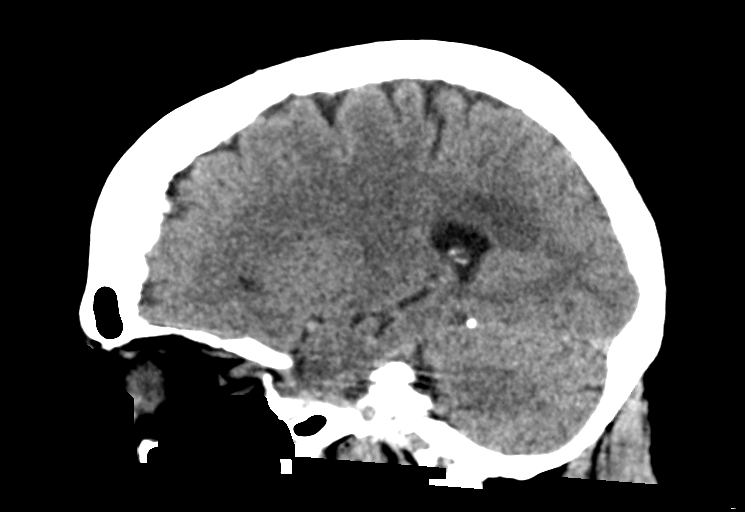

[14 of 47 positions shown; findings below may reference images not displayed]

FINDINGS: The ventricles are normal in size and configuration. There is no
intracranial mass, hemorrhage, extra-axial fluid collection, or
midline shift. There is slight small vessel disease in the centra
semiovale bilaterally. Elsewhere gray-white compartments appear
normal. No acute infarct is evident. There is no demonstrable
hyperdense vessel on this study. There is calcification in both
carotid siphon regions. The bony calvarium appears intact. There is
opacification of several inferior mastoid air cells on the left.
Most of the mastoid air cells are clear. There is mild mucosal
thickening in several ethmoid air cells bilaterally. Visualized
paranasal sinuses elsewhere are clear. No intraorbital lesions are
evident.
IMPRESSION: Mild periventricular small vessel disease. No intracranial mass,
hemorrhage, or evidence of acute infarct. No hyperdense vessel.
There are vascular calcifications in the carotid siphon regions
bilaterally. There is opacification several ethmoid air cells as
well as in the inferior left mastoid region.

Critical Value/emergent results were called by telephone at the time
of interpretation on 12/13/2015 at [DATE] to Dr. Saez Ortiz Edwin Katiria,
neurology, who verbally acknowledged these results.

## 2016-10-30 ENCOUNTER — Encounter: Payer: Self-pay | Admitting: Family

## 2016-10-31 DIAGNOSIS — H16211 Exposure keratoconjunctivitis, right eye: Secondary | ICD-10-CM | POA: Diagnosis not present

## 2016-11-04 ENCOUNTER — Other Ambulatory Visit: Payer: Self-pay

## 2016-11-04 DIAGNOSIS — Z9889 Other specified postprocedural states: Secondary | ICD-10-CM

## 2016-11-04 DIAGNOSIS — I6523 Occlusion and stenosis of bilateral carotid arteries: Secondary | ICD-10-CM

## 2016-11-12 ENCOUNTER — Ambulatory Visit (HOSPITAL_COMMUNITY)
Admit: 2016-11-12 | Discharge: 2016-11-12 | Disposition: A | Discharge status: Home / Self Care | Payer: Medicare Other | Attending: Vascular Surgery | Admitting: Vascular Surgery

## 2016-11-12 ENCOUNTER — Encounter: Payer: Self-pay | Admitting: Family

## 2016-11-12 ENCOUNTER — Ambulatory Visit (INDEPENDENT_AMBULATORY_CARE_PROVIDER_SITE_OTHER): Payer: Medicare Other | Admitting: Family

## 2016-11-12 VITALS — BP 150/87 | HR 62 | Temp 97.6°F | Resp 20 | Ht 66.5 in | Wt 235.0 lb

## 2016-11-12 DIAGNOSIS — Z9889 Other specified postprocedural states: Secondary | ICD-10-CM | POA: Diagnosis not present

## 2016-11-12 DIAGNOSIS — I6521 Occlusion and stenosis of right carotid artery: Secondary | ICD-10-CM | POA: Diagnosis not present

## 2016-11-12 DIAGNOSIS — I6523 Occlusion and stenosis of bilateral carotid arteries: Secondary | ICD-10-CM | POA: Diagnosis not present

## 2016-11-12 LAB — VAS US CAROTID
LCCADDIAS: 10 cm/s
LCCADSYS: 72 cm/s
LCCAPDIAS: 15 cm/s
LEFT ECA DIAS: -2 cm/s
LEFT VERTEBRAL DIAS: 11 cm/s
LICADDIAS: -25 cm/s
LICADSYS: -92 cm/s
LICAPDIAS: -21 cm/s
Left CCA prox sys: 147 cm/s
Left ICA prox sys: -87 cm/s
RIGHT CCA MID DIAS: 15 cm/s
RIGHT ECA DIAS: 3 cm/s
RIGHT VERTEBRAL DIAS: -9 cm/s
Right CCA prox dias: 10 cm/s
Right CCA prox sys: 79 cm/s
Right cca dist sys: -71 cm/s

## 2016-11-12 NOTE — Progress Notes (Signed)
Chief Complaint: Follow up Extracranial Carotid Artery Stenosis   History of Present Illness  Lori Price is a 83 y.o. female patient of Dr. Scot Dock who is s/p right carotid endarterectomy on 05/22/2005 by Dr. Amedeo Plenty. She returns today for follow up. Pt denies any remote or recent history of stroke or TIA, but states she had a large MI in 2001, had 4 vessel CABG. Pt states Dr. Percival Spanish is aware of her occasionally irregular heart rhythm.   The patient denies any history of TIA or stroke symptoms, specifically she denies a history of amaurosis fugax or monocular blindness, unilateral facial drooping, hemiplegia, or receptive or expressive aphasia.    Pt reports that she was diagnosed with lumbar stenosis. Her hips and knees also are intermittently painful.  Fell in 2015 and had right shoulder replacement in April, 2015.  She does not use ETOH, has "bad knees", states she needs left knee replacement but decided not to have this done.  Pt Diabetic: No Pt smoker: non-smoker  Pt meds include: Statin : yes ASA: Yes Other anticoagulants/antiplatelets: no    Past Medical History:  Diagnosis Date  . Arthritis    OA  . Asthma   . BPV (benign positional vertigo)   . Carotid artery occlusion   . Coronary artery disease    s/p MI 2001--(catheterizations in 2001 with left main 70% stenosis, LAD 70% followed by 90% stenosis, circumflex 99% stenosis, right coronary artery occluded.   . Hyperlipidemia   . Hypertension    Dr. Percival Spanish  . Nephrolithiasis   . Peripheral vascular disease ([place])   . Sleep apnea    CPAP   no  . Vertigo     Social History Social History  Substance Use Topics  . Smoking status: Never Smoker  . Smokeless tobacco: Never Used  . Alcohol use No    Family History Family History  Problem Relation Age of Onset  . Ovarian cancer Mother   . Cancer Mother        Ovarian  . COPD Father   . Aneurysm Father        Throat  . Kidney disease Father   .  Heart disease Maternal Grandmother   . Heart disease Maternal Grandfather     Surgical History Past Surgical History:  Procedure Laterality Date  . CARDIAC CATHETERIZATION     2001  . CAROTID ENDARTERECTOMY Right    cea  Dr. Amedeo Plenty  . CARPAL TUNNEL RELEASE Right   . CORONARY ARTERY BYPASS GRAFT     2001 (LIMA to LAD, SVG to obtuse marginal, sequential  SVG to RV , marginal branch in the distal right coronary artery.  Marland Kitchen knee arthoscopy    . left carpal tunnel    . ORIF SHOULDER FRACTURE Right 08/30/2013   Procedure: HEMI ARTHROPLASTY;  Surgeon: [name], MD;  Location: [facility];  Service: Orthopedics;  Laterality: Right;  . right carotid endarterectomy    . TONSILLECTOMY      Allergies  Allergen Reactions  . Codeine Hives, Itching and Other (See Comments)    extreme agitation  . Losartan Other (See Comments)    "I can't walk, I can't do anything"   . Oxycodone Hives and Itching  . Penicillins Swelling and Rash    Has patient had a PCN reaction causing immediate rash, facial/tongue/throat swelling, SOB or lightheadedness with hypotension: NO Has patient had a PCN reaction causing severe rash involving mucus membranes or skin necrosis: NO Has patient had  a PCN reaction that required hospitalization: NO  Has patient had a PCN reaction occurring within the last 10 years: NO If all of the above answers are "NO", then may proceed with Cephalosporin use.    Marland Kitchen Pentazocine Other (See Comments) and Nausea And Vomiting  . Quinapril Cough    REACTION: cough  . Sibutramine Palpitations    REACTION: palpitations  . Sulfamethoxazole Nausea And Vomiting    hives  . Sulfonamide Derivatives Rash  . Ace Inhibitors Cough    cough  . Eggs Or Egg-Derived Products Hives, Diarrhea and Other (See Comments)    Pt stated "My esophagus swells, I get hives, diarrhea, and I pass out"  . Losartan Potassium     cough  . Other Other (See Comments)    EGGS: passes out, hives    Current  Outpatient Prescriptions  Medication Sig Dispense Refill  . acetaminophen (TYLENOL) 500 MG tablet Take 1,000 mg by mouth every 6 (six) hours as needed.    Marland Kitchen aspirin 81 MG tablet Take 81 mg by mouth daily.      Marland Kitchen atenolol (TENORMIN) 50 MG tablet Take 1 tablet (50 mg total) by mouth daily. 90 tablet 3  . beclomethasone (QVAR) 40 MCG/ACT inhaler Inhale 1 puff into the lungs 2 (two) times daily as needed (for breathing difficulty during allergery season).     . bisacodyl (DULCOLAX) 5 MG EC tablet Take 5 mg by mouth daily as needed for moderate constipation.     . fexofenadine (ALLEGRA) 180 MG tablet Take 180 mg by mouth daily as needed for allergies.     . folic acid (FOLVITE) 607 MCG tablet Take 400 mcg by mouth daily.      . furosemide (LASIX) 20 MG tablet Take 0.5 tablets (10 mg total) by mouth daily. 45 tablet 3  . HYDROcodone-acetaminophen (NORCO) 5-325 MG per tablet Take 1-2 tablets by mouth every 4 (four) hours as needed for moderate pain. 60 tablet 0  . Mag Aspart-Potassium Aspart (ASPARTATE MG & K) 90-90 MG CAPS Take 1 capsule by mouth 3 (three) times a week. On Mon, Wed, Fri    . Magnesium 250 MG TABS Take 250 mg by mouth 2 (two) times daily.     . meclizine (ANTIVERT) 25 MG tablet Take 25 mg by mouth daily as needed for dizziness.     . Omega-3 Fatty Acids (FISH OIL) 1000 MG CAPS Take 1,000 mg by mouth daily.     Marland Kitchen ibuprofen (ADVIL,MOTRIN) 200 MG tablet Take 800 mg by mouth every 6 (six) hours as needed.    . pravastatin (PRAVACHOL) 40 MG tablet Take 1 tablet (40 mg total) by mouth every evening. 90 tablet 3  . triamterene-hydrochlorothiazide (DYAZIDE) 37.5-25 MG capsule Take 1 each (1 capsule total) by mouth daily. 90 capsule 3   No current facility-administered medications for this visit.     Review of Systems : See HPI for pertinent positives and negatives.  Physical Examination  Vitals:   11/12/16 1232 11/12/16 1235  BP: (!) 146/81 (!) 150/87  Pulse: 62   Resp: 20   Temp:  97.6 F (36.4 C)   TempSrc: Oral   SpO2: 91%   Weight: 235 lb (106.6 kg)   Height: 5' 6.5" (1.689 m)    Body mass index is 37.36 kg/m.  General: WDWN obese female in NAD GAIT: antalgic, using cane Eyes: PERRLA Pulmonary: Respirations are non-labored, CTAB, distant breath sounds Cardiac: regular rhythm and rate,positive murmur.  VASCULAR EXAM Carotid  Bruits Left Right   Negative Transmitted cardiac murmur   Radial pulses are 2+ palpable and equal.      LE Pulses LEFT RIGHT   POPLITEAL not palpable  not palpable   POSTERIOR TIBIAL 2+ palpable  2+ palpable    DORSALIS PEDIS  ANTERIOR TIBIAL not palpable  1+ palpable     Gastrointestinal: soft, nontender, BS WNL, no r/g, no palpable masses.  Musculoskeletal: No muscle atrophy/wasting. M/S 5/5 throughout except 3/5 in right UE, Extremities without ischemic changes.  Neurologic: A&O X 3; Appropriate Affect, Speech is normal CN 2-12 intact, Pain and light touch intact in extremities, Motor exam as listed above    Assessment: KALIE CABRAL is a 83 y.o. female who is s/p right carotid endarterectomy on 05/22/2005. She denies any remote or recent history of stroke or TIA.   Her atherosclerotic risk factors include CAD, dyslipidemia, and obesity. Fortunately she does not have DM and has never used tobacco. She was exposed to secondhand smoke as a child from her father.   DATA Carotid Duplex (11/12/16): Right carotid endarterectomy site with 1-39% stenosis 1-39% left internal carotid artery stenosis. Bilateral vertebral artery flow is antegrade.  Bilateral subclavian artery waveforms are normal.  No significant change compared to the last exam on 11-07-15.    Plan: Follow-up in 18 months with Carotid Duplex scan.   I  discussed in depth with the patient the nature of atherosclerosis, and emphasized the importance of maximal medical management including strict control of blood pressure, blood glucose, and lipid levels, obtaining regular exercise, and continued cessation of smoking.  The patient is aware that without maximal medical management the underlying atherosclerotic disease process will progress, limiting the benefit of any interventions. The patient was given information about stroke prevention and what symptoms should prompt the patient to seek immediate medical care. Thank you for allowing [place] to participate in this patient's care.  Clemon Chambers, RN, MSN, FNP-C [facility] Office: [phone]  Clinic Physician: Early  11/12/16 12:39 PM

## 2016-11-12 NOTE — Patient Instructions (Signed)
Stroke Prevention Some medical conditions and behaviors are associated with an increased chance of having a stroke. You may prevent a stroke by making healthy choices and managing medical conditions. How can I reduce my risk of having a stroke?  Stay physically active. Get at least 30 minutes of activity on most or all days.  Do not smoke. It may also be helpful to avoid exposure to secondhand smoke.  Limit alcohol use. Moderate alcohol use is considered to be:  No more than 2 drinks per day for men.  No more than 1 drink per day for nonpregnant women.  Eat healthy foods. This involves:  Eating 5 or more servings of fruits and vegetables a day.  Making dietary changes that address high blood pressure (hypertension), high cholesterol, diabetes, or obesity.  Manage your cholesterol levels.  Making food choices that are high in fiber and low in saturated fat, trans fat, and cholesterol may control cholesterol levels.  Take any prescribed medicines to control cholesterol as directed by your health care provider.  Manage your diabetes.  Controlling your carbohydrate and sugar intake is recommended to manage diabetes.  Take any prescribed medicines to control diabetes as directed by your health care provider.  Control your hypertension.  Making food choices that are low in salt (sodium), saturated fat, trans fat, and cholesterol is recommended to manage hypertension.  Ask your health care provider if you need treatment to lower your blood pressure. Take any prescribed medicines to control hypertension as directed by your health care provider.  If you are 18-39 years of age, have your blood pressure checked every 3-5 years. If you are 40 years of age or older, have your blood pressure checked every year.  Maintain a healthy weight.  Reducing calorie intake and making food choices that are low in sodium, saturated fat, trans fat, and cholesterol are recommended to manage  weight.  Stop drug abuse.  Avoid taking birth control pills.  Talk to your health care provider about the risks of taking birth control pills if you are over 35 years old, smoke, get migraines, or have ever had a blood clot.  Get evaluated for sleep disorders (sleep apnea).  Talk to your health care provider about getting a sleep evaluation if you snore a lot or have excessive sleepiness.  Take medicines only as directed by your health care provider.  For some people, aspirin or blood thinners (anticoagulants) are helpful in reducing the risk of forming abnormal blood clots that can lead to stroke. If you have the irregular heart rhythm of atrial fibrillation, you should be on a blood thinner unless there is a good reason you cannot take them.  Understand all your medicine instructions.  Make sure that other conditions (such as anemia or atherosclerosis) are addressed. Get help right away if:  You have sudden weakness or numbness of the face, arm, or leg, especially on one side of the body.  Your face or eyelid droops to one side.  You have sudden confusion.  You have trouble speaking (aphasia) or understanding.  You have sudden trouble seeing in one or both eyes.  You have sudden trouble walking.  You have dizziness.  You have a loss of balance or coordination.  You have a sudden, severe headache with no known cause.  You have new chest pain or an irregular heartbeat. Any of these symptoms may represent a serious problem that is an emergency. Do not wait to see if the symptoms will go away.   Get medical help at once. Call your local emergency services (911 in [place] Do not drive yourself to the hospital. This information is not intended to replace advice given to you by your health care provider. Make sure you discuss any questions you have with your health care provider. Document Released: 06/13/2004 Document Revised: 10/12/2015 Document Reviewed: 11/06/2012 Elsevier  Interactive Patient Education  2017 Elsevier Inc.  

## 2016-11-27 DIAGNOSIS — H16211 Exposure keratoconjunctivitis, right eye: Secondary | ICD-10-CM | POA: Diagnosis not present

## 2017-01-08 NOTE — Addendum Note (Signed)
Addended by: Lianne Cure A on: 01/08/2017 11:56 AM   Modules accepted: Orders

## 2017-04-01 ENCOUNTER — Telehealth: Payer: Self-pay | Admitting: Cardiology

## 2017-04-01 MED ORDER — ATENOLOL 50 MG PO TABS: 50.0000 mg | ORAL_TABLET | Freq: Every day | ORAL | 1 refills | Status: DC

## 2017-04-01 NOTE — Telephone Encounter (Signed)
New message     *STAT* If patient is at the pharmacy, call can be transferred to refill team.   1. Which medications need to be refilled? (please list name of each medication and dose if known) atenolol (TENORMIN) 50 MG tablet  2. Which pharmacy/location (including street and city if local pharmacy) is medication to be sent to? [place] 3. Do they need a 30 day or 90 day supply? [place]

## 2017-04-01 NOTE — Telephone Encounter (Signed)
Rx(s) sent to pharmacy electronically.  

## 2017-04-03 DIAGNOSIS — H40013 Open angle with borderline findings, low risk, bilateral: Secondary | ICD-10-CM | POA: Diagnosis not present

## 2017-04-03 DIAGNOSIS — H2513 Age-related nuclear cataract, bilateral: Secondary | ICD-10-CM | POA: Diagnosis not present

## 2017-04-03 DIAGNOSIS — H04123 Dry eye syndrome of bilateral lacrimal glands: Secondary | ICD-10-CM | POA: Diagnosis not present

## 2017-04-03 DIAGNOSIS — H10413 Chronic giant papillary conjunctivitis, bilateral: Secondary | ICD-10-CM | POA: Diagnosis not present

## 2017-06-05 DIAGNOSIS — I517 Cardiomegaly: Secondary | ICD-10-CM | POA: Diagnosis not present

## 2017-06-05 DIAGNOSIS — I2510 Atherosclerotic heart disease of native coronary artery without angina pectoris: Secondary | ICD-10-CM | POA: Diagnosis not present

## 2017-06-05 DIAGNOSIS — I10 Essential (primary) hypertension: Secondary | ICD-10-CM | POA: Diagnosis not present

## 2017-06-05 DIAGNOSIS — J45909 Unspecified asthma, uncomplicated: Secondary | ICD-10-CM | POA: Diagnosis not present

## 2017-06-05 DIAGNOSIS — R0600 Dyspnea, unspecified: Secondary | ICD-10-CM | POA: Diagnosis not present

## 2017-06-05 DIAGNOSIS — J209 Acute bronchitis, unspecified: Secondary | ICD-10-CM | POA: Diagnosis not present

## 2017-06-05 DIAGNOSIS — R0602 Shortness of breath: Secondary | ICD-10-CM | POA: Diagnosis not present

## 2017-06-05 DIAGNOSIS — Z6838 Body mass index (BMI) 38.0-38.9, adult: Secondary | ICD-10-CM | POA: Diagnosis not present

## 2017-06-05 DIAGNOSIS — R05 Cough: Secondary | ICD-10-CM | POA: Diagnosis not present

## 2017-06-05 DIAGNOSIS — Z79899 Other long term (current) drug therapy: Secondary | ICD-10-CM | POA: Diagnosis not present

## 2017-06-05 DIAGNOSIS — Z299 Encounter for prophylactic measures, unspecified: Secondary | ICD-10-CM | POA: Diagnosis not present

## 2017-06-05 DIAGNOSIS — R0989 Other specified symptoms and signs involving the circulatory and respiratory systems: Secondary | ICD-10-CM | POA: Diagnosis not present

## 2017-06-05 DIAGNOSIS — R5383 Other fatigue: Secondary | ICD-10-CM | POA: Diagnosis not present

## 2017-06-05 DIAGNOSIS — E7800 Pure hypercholesterolemia, unspecified: Secondary | ICD-10-CM | POA: Diagnosis not present

## 2017-06-09 DIAGNOSIS — R0602 Shortness of breath: Secondary | ICD-10-CM | POA: Diagnosis not present

## 2017-06-11 DIAGNOSIS — J209 Acute bronchitis, unspecified: Secondary | ICD-10-CM | POA: Diagnosis not present

## 2017-06-11 DIAGNOSIS — Z6838 Body mass index (BMI) 38.0-38.9, adult: Secondary | ICD-10-CM | POA: Diagnosis not present

## 2017-06-11 DIAGNOSIS — Z299 Encounter for prophylactic measures, unspecified: Secondary | ICD-10-CM | POA: Diagnosis not present

## 2017-06-11 DIAGNOSIS — I10 Essential (primary) hypertension: Secondary | ICD-10-CM | POA: Diagnosis not present

## 2017-06-11 DIAGNOSIS — J45909 Unspecified asthma, uncomplicated: Secondary | ICD-10-CM | POA: Diagnosis not present

## 2017-07-21 DIAGNOSIS — I517 Cardiomegaly: Secondary | ICD-10-CM | POA: Diagnosis not present

## 2017-07-21 DIAGNOSIS — R05 Cough: Secondary | ICD-10-CM | POA: Diagnosis not present

## 2017-08-05 DIAGNOSIS — H25813 Combined forms of age-related cataract, bilateral: Secondary | ICD-10-CM | POA: Diagnosis not present

## 2017-08-05 DIAGNOSIS — H40013 Open angle with borderline findings, low risk, bilateral: Secondary | ICD-10-CM | POA: Diagnosis not present

## 2017-08-11 DIAGNOSIS — H2511 Age-related nuclear cataract, right eye: Secondary | ICD-10-CM | POA: Diagnosis not present

## 2017-08-20 ENCOUNTER — Ambulatory Visit: Payer: Medicare Other | Admitting: Cardiology

## 2017-08-21 DIAGNOSIS — H2512 Age-related nuclear cataract, left eye: Secondary | ICD-10-CM | POA: Diagnosis not present

## 2017-08-25 DIAGNOSIS — H2512 Age-related nuclear cataract, left eye: Secondary | ICD-10-CM | POA: Diagnosis not present

## 2017-08-27 ENCOUNTER — Other Ambulatory Visit: Payer: Self-pay | Admitting: Cardiology

## 2017-08-27 NOTE — Telephone Encounter (Signed)
REFILL 

## 2017-09-19 DIAGNOSIS — Z7189 Other specified counseling: Secondary | ICD-10-CM | POA: Diagnosis not present

## 2017-09-19 DIAGNOSIS — I10 Essential (primary) hypertension: Secondary | ICD-10-CM | POA: Diagnosis not present

## 2017-09-19 DIAGNOSIS — Z0000 Encounter for general adult medical examination without abnormal findings: Secondary | ICD-10-CM | POA: Diagnosis not present

## 2017-09-19 DIAGNOSIS — Z299 Encounter for prophylactic measures, unspecified: Secondary | ICD-10-CM | POA: Diagnosis not present

## 2017-09-19 DIAGNOSIS — Z1211 Encounter for screening for malignant neoplasm of colon: Secondary | ICD-10-CM | POA: Diagnosis not present

## 2017-09-19 DIAGNOSIS — R5383 Other fatigue: Secondary | ICD-10-CM | POA: Diagnosis not present

## 2017-09-19 DIAGNOSIS — E7800 Pure hypercholesterolemia, unspecified: Secondary | ICD-10-CM | POA: Diagnosis not present

## 2017-09-19 DIAGNOSIS — Z79899 Other long term (current) drug therapy: Secondary | ICD-10-CM | POA: Diagnosis not present

## 2017-09-19 DIAGNOSIS — Z6838 Body mass index (BMI) 38.0-38.9, adult: Secondary | ICD-10-CM | POA: Diagnosis not present

## 2017-09-19 DIAGNOSIS — E2839 Other primary ovarian failure: Secondary | ICD-10-CM | POA: Diagnosis not present

## 2017-09-19 DIAGNOSIS — I2510 Atherosclerotic heart disease of native coronary artery without angina pectoris: Secondary | ICD-10-CM | POA: Diagnosis not present

## 2017-09-19 DIAGNOSIS — Z1331 Encounter for screening for depression: Secondary | ICD-10-CM | POA: Diagnosis not present

## 2017-09-19 DIAGNOSIS — Z1339 Encounter for screening examination for other mental health and behavioral disorders: Secondary | ICD-10-CM | POA: Diagnosis not present

## 2017-09-24 ENCOUNTER — Other Ambulatory Visit: Payer: Self-pay | Admitting: Cardiology

## 2017-09-24 NOTE — Telephone Encounter (Signed)
Rx(s) sent to pharmacy electronically.  

## 2017-09-25 ENCOUNTER — Encounter: Payer: Self-pay | Admitting: Cardiology

## 2017-10-12 NOTE — Progress Notes (Signed)
Cardiology Office Note   Date:  10/15/2017   ID:  Lori Price, DOB 1931/08/24, MRN [id number]  PCP:  Glenda Chroman, MD  Cardiologist:   Minus Breeding, MD   No chief complaint on file.     History of Present Illness: Lori Price is a 84 y.o. female who presents for  CAD/CABG.    She was in the hospital in July of 2017 with a TIA.  She had a hypertensive urgency  As part of this evaluation she did have an echo which was unchanged from previous.  She had a stress perfusion study in July 2018 with a small inferior defect possibly a prior MI with mild ischemia.  The EF was 60%.  A BNP level was mildly elevated and I did start her on Lasix.  A sleep study had severe oxygen desaturations and severe periodic limb movements. However, there were some scheduling issues and she never had her CPAP.  I tried to convince her to pursue treatment of this.  She declined previously and today still does not want to address this.    The patient denies any new symptoms such as chest discomfort, neck or arm discomfort. There has been no new shortness of breath, PND or orthopnea. There have been no reported palpitations, presyncope or syncope.  She gets around slowly with two canes.  She is limited by joint pain and back pain.  The patient denies any new symptoms such as chest discomfort, neck or arm discomfort. There has been no new shortness of breath, PND or orthopnea. There have been no reported palpitations, presyncope or syncope.  She sleeps chronically in a chair because this is comfortable for her.    Past Medical History:  Diagnosis Date  . Arthritis    OA  . Asthma   . BPV (benign positional vertigo)   . Carotid artery occlusion   . Coronary artery disease    s/p MI 2001--(catheterizations in 2001 with left main 70% stenosis, LAD 70% followed by 90% stenosis, circumflex 99% stenosis, right coronary artery occluded.   . Hyperlipidemia   . Hypertension    Dr. Percival Spanish  . Nephrolithiasis   .  Peripheral vascular disease ([place])   . Sleep apnea    CPAP   no  . Vertigo     Past Surgical History:  Procedure Laterality Date  . CARDIAC CATHETERIZATION     2001  . CAROTID ENDARTERECTOMY Right    cea  Dr. Amedeo Plenty  . CARPAL TUNNEL RELEASE Right   . CORONARY ARTERY BYPASS GRAFT     2001 (LIMA to LAD, SVG to obtuse marginal, sequential  SVG to RV , marginal branch in the distal right coronary artery.  Marland Kitchen knee arthoscopy    . left carpal tunnel    . ORIF SHOULDER FRACTURE Right 08/30/2013   Procedure: HEMI ARTHROPLASTY;  Surgeon: [name], MD;  Location: [facility];  Service: Orthopedics;  Laterality: Right;  . right carotid endarterectomy    . TONSILLECTOMY       Current Outpatient Medications  Medication Sig Dispense Refill  . aspirin 81 MG tablet Take 81 mg by mouth daily.      Marland Kitchen atenolol (TENORMIN) 50 MG tablet Take 1 tablet (50 mg total) daily by mouth. 90 tablet 1  . beclomethasone (QVAR) 40 MCG/ACT inhaler Inhale 1 puff into the lungs 2 (two) times daily as needed (for breathing difficulty during allergery season).     . bisacodyl (DULCOLAX) 5  MG EC tablet Take 5 mg by mouth daily as needed for moderate constipation.     . fexofenadine (ALLEGRA) 180 MG tablet Take 180 mg by mouth daily as needed for allergies.     . folic acid (FOLVITE) 263 MCG tablet 400 mcg. Take one tablet by mouth every third day    . furosemide (LASIX) 20 MG tablet Take 0.5 tablets (10 mg total) by mouth daily. 45 tablet 0  . HYDROcodone-acetaminophen (NORCO) 5-325 MG per tablet Take 1-2 tablets by mouth every 4 (four) hours as needed for moderate pain. 60 tablet 0  . ibuprofen (ADVIL,MOTRIN) 200 MG tablet Take 800 mg by mouth every 6 (six) hours as needed.    [name] Aspart-Potassium Aspart (ASPARTATE MG & K) 90-90 MG CAPS Take 1 capsule by mouth once a week. On Mon, Wed, Fri    . meclizine (ANTIVERT) 25 MG tablet Take 25 mg by mouth daily as needed for dizziness.     . Multiple Vitamin (MULTIVITAMIN)  capsule Take 1 capsule by mouth every other day.    . Omega-3 Fatty Acids (FISH OIL) 1000 MG CAPS Take 1,000 mg by mouth daily.     . pravastatin (PRAVACHOL) 40 MG tablet Take 1 tablet (40 mg total) by mouth every evening. 90 tablet 3  . triamterene-hydrochlorothiazide (DYAZIDE) 37.5-25 MG capsule Take 1 each (1 capsule total) by mouth daily. KEEP OV. 90 capsule 0  . acetaminophen (TYLENOL) 500 MG tablet Take 1,000 mg by mouth every 6 (six) hours as needed.     No current facility-administered medications for this visit.     Allergies:   Codeine; Losartan; Oxycodone; Penicillins; Pentazocine; Quinapril; Sibutramine; Sulfamethoxazole; Sulfonamide derivatives; Ace inhibitors; Eggs or egg-derived products; Losartan potassium; and Other   ROS:  Please see the history of present illness.   Otherwise, review of systems are positive for none.   All other systems are reviewed and negative.    PHYSICAL EXAM: VS:  BP 116/78   Pulse 60   Ht 5' 6.5" (1.689 m)   Wt 238 lb (108 kg)   BMI 37.84 kg/m  , BMI Body mass index is 37.84 kg/m.  GENERAL:  Well appearing NECK:  No jugular venous distention, waveform within normal limits, carotid upstroke brisk and symmetric, no bruits, no thyromegaly LUNGS:  Clear to auscultation bilaterally CHEST:  Well healed sternotomy scar. HEART:  PMI not displaced or sustained,S1 and S2 within normal limits, no S3, no S4, no clicks, no rubs, 2 out of 6 apical systolic murmur radiating slightly at the aortic outflow tract, no diastolic murmurs ABD:  Flat, positive bowel sounds normal in frequency in pitch, no bruits, no rebound, no guarding, no midline pulsatile mass, no hepatomegaly, no splenomegaly EXT:  2 plus pulses throughout, mild edema, no cyanosis no clubbing   EKG:  EKG is  ordered today. Sinus rhythm, rate 60, axis within normal limits, intervals within normal limits, no acute ST-T wave changes.  PACs, probable old inferior infarct  Recent Labs: No results  found for requested labs within last 8760 hours.    Lipid Panel    Component Value Date/Time   CHOL 153 02/08/2016 0852   TRIG 213 (H) 02/08/2016 0852   HDL 39 (L) 02/08/2016 0852   CHOLHDL 3.9 02/08/2016 0852   CHOLHDL 7.8 12/14/2015 0542   VLDL UNABLE TO CALCULATE IF TRIGLYCERIDE OVER 400 mg/dL 12/14/2015 0542   LDLCALC 71 02/08/2016 0852   LDLDIRECT 111.8 02/03/2014 0924     Lab  Results  Component Value Date   HGBA1C 6.0 (H) 12/14/2015    Wt Readings from Last 3 Encounters:  10/15/17 238 lb (108 kg)  11/12/16 235 lb (106.6 kg)  08/07/16 240 lb (108.9 kg)     Other studies Reviewed: Additional studies/ records that were reviewed today include: None Review of the above records demonstrates:      ASSESSMENT AND PLAN:  CAD:   Since her last stress test in 2017 The patient has no new sypmtoms.  No further cardiovascular testing is indicated.  We will continue with aggressive risk reduction and meds as listed.  HTN:  The blood pressure is at targetr.  No change in therapy.   SLEEP APNEA:     She does not want treatment of this.   CAROTID STENOSIS:  She is having this followed by vascular surgery.    DYSLIPIDEMIA:    She says that she will get me these results.    MURMUR:  She had mild AS on echo in 2018.  No change in therapy is indicated.      Current medicines are reviewed at length with the patient today.  The patient does not have concerns regarding medicines.  The following changes have been made:  None  Labs/ tests ordered today include:  None  No orders of the defined types were placed in this encounter.    Disposition:   FU with me in 12 months.   Signed, Minus Breeding, MD  10/15/2017 11:41 AM    [facility]

## 2017-10-15 ENCOUNTER — Ambulatory Visit (INDEPENDENT_AMBULATORY_CARE_PROVIDER_SITE_OTHER): Payer: Medicare Other | Admitting: Cardiology

## 2017-10-15 ENCOUNTER — Encounter: Payer: Self-pay | Admitting: Cardiology

## 2017-10-15 VITALS — BP 116/78 | HR 60 | Ht 66.5 in | Wt 238.0 lb

## 2017-10-15 DIAGNOSIS — I350 Nonrheumatic aortic (valve) stenosis: Secondary | ICD-10-CM | POA: Insufficient documentation

## 2017-10-15 DIAGNOSIS — I10 Essential (primary) hypertension: Secondary | ICD-10-CM

## 2017-10-15 DIAGNOSIS — G4730 Sleep apnea, unspecified: Secondary | ICD-10-CM

## 2017-10-15 DIAGNOSIS — E785 Hyperlipidemia, unspecified: Secondary | ICD-10-CM | POA: Diagnosis not present

## 2017-10-15 DIAGNOSIS — I2510 Atherosclerotic heart disease of native coronary artery without angina pectoris: Secondary | ICD-10-CM | POA: Diagnosis not present

## 2017-10-15 MED ORDER — ATENOLOL 50 MG PO TABS: 50.0000 mg | ORAL_TABLET | Freq: Every day | ORAL | 3 refills | Status: DC

## 2017-10-15 MED ORDER — FUROSEMIDE 20 MG PO TABS: 10.0000 mg | ORAL_TABLET | Freq: Every day | ORAL | 3 refills | Status: DC

## 2017-10-15 MED ORDER — PRAVASTATIN SODIUM 40 MG PO TABS: 40.0000 mg | ORAL_TABLET | Freq: Every evening | ORAL | 3 refills | Status: DC

## 2017-10-15 MED ORDER — TRIAMTERENE-HCTZ 37.5-25 MG PO CAPS: 1.0000 | ORAL_CAPSULE | Freq: Every day | ORAL | 3 refills | Status: DC

## 2017-10-15 NOTE — Patient Instructions (Signed)

## 2017-11-28 DIAGNOSIS — E7800 Pure hypercholesterolemia, unspecified: Secondary | ICD-10-CM | POA: Diagnosis not present

## 2017-11-28 DIAGNOSIS — Z6839 Body mass index (BMI) 39.0-39.9, adult: Secondary | ICD-10-CM | POA: Diagnosis not present

## 2017-11-28 DIAGNOSIS — J209 Acute bronchitis, unspecified: Secondary | ICD-10-CM | POA: Diagnosis not present

## 2017-11-28 DIAGNOSIS — Z299 Encounter for prophylactic measures, unspecified: Secondary | ICD-10-CM | POA: Diagnosis not present

## 2017-11-28 DIAGNOSIS — I10 Essential (primary) hypertension: Secondary | ICD-10-CM | POA: Diagnosis not present

## 2017-11-28 DIAGNOSIS — J45909 Unspecified asthma, uncomplicated: Secondary | ICD-10-CM | POA: Diagnosis not present

## 2018-01-23 DIAGNOSIS — E7800 Pure hypercholesterolemia, unspecified: Secondary | ICD-10-CM | POA: Diagnosis not present

## 2018-01-23 DIAGNOSIS — I2510 Atherosclerotic heart disease of native coronary artery without angina pectoris: Secondary | ICD-10-CM | POA: Diagnosis not present

## 2018-01-23 DIAGNOSIS — Z6838 Body mass index (BMI) 38.0-38.9, adult: Secondary | ICD-10-CM | POA: Diagnosis not present

## 2018-01-23 DIAGNOSIS — I10 Essential (primary) hypertension: Secondary | ICD-10-CM | POA: Diagnosis not present

## 2018-01-23 DIAGNOSIS — Z299 Encounter for prophylactic measures, unspecified: Secondary | ICD-10-CM | POA: Diagnosis not present

## 2018-05-26 ENCOUNTER — Ambulatory Visit: Payer: Medicare Other | Admitting: Family

## 2018-05-26 ENCOUNTER — Encounter (HOSPITAL_COMMUNITY): Payer: Medicare Other

## 2018-06-12 ENCOUNTER — Encounter (HOSPITAL_COMMUNITY): Payer: Medicare Other

## 2018-06-12 ENCOUNTER — Ambulatory Visit: Payer: Medicare Other | Admitting: Family

## 2018-07-22 NOTE — Progress Notes (Signed)
HISTORY AND PHYSICAL     CC:  follow up. Requesting Provider:  Glenda Chroman, MD  HPI: This is a 85 y.o. female here for follow up for carotid artery stenosis.  Pt is s/p right CEA by Dr. Amedeo Plenty on 05/22/05.  Pt was last seen June 2018 and at that time, she was asymptomatic.    She has hx of CABG x 4 and is followed by Dr. Percival Spanish for irregular heart rhythm.  She has a hx of mild AS by echo in 2018.    She comes in today for follow up.  She states she is doing well and denies any amaurosis fugax, speech difficulties, facial droop, weakness or hemiparesis.  She does get some swelling in the right leg since vein harvest for her CABG.   She also has had some pain down the left lateral leg.  She states she has spinal stenosis.   She does quite a bit of traveling.    The pt is on a statin for cholesterol management.  The pt is on a daily aspirin.   Other AC:  none The pt is on BB for hypertension.   The pt is not diabetic.   Tobacco hx:  never   Past Medical History:  Diagnosis Date  . Arthritis    OA  . Asthma   . BPV (benign positional vertigo)   . Carotid artery occlusion   . Coronary artery disease    s/p MI 2001--(catheterizations in 2001 with left main 70% stenosis, LAD 70% followed by 90% stenosis, circumflex 99% stenosis, right coronary artery occluded.   . Hyperlipidemia   . Hypertension    Dr. Percival Spanish  . Nephrolithiasis   . Peripheral vascular disease ([place])   . Sleep apnea    CPAP   no  . Vertigo     Past Surgical History:  Procedure Laterality Date  . CARDIAC CATHETERIZATION     2001  . CAROTID ENDARTERECTOMY Right    cea  Dr. Amedeo Plenty  . CARPAL TUNNEL RELEASE Right   . CORONARY ARTERY BYPASS GRAFT     2001 (LIMA to LAD, SVG to obtuse marginal, sequential  SVG to RV , marginal branch in the distal right coronary artery.  Marland Kitchen knee arthoscopy    . left carpal tunnel    . ORIF SHOULDER FRACTURE Right 08/30/2013   Procedure: HEMI ARTHROPLASTY;  Surgeon: [name], MD;  Location: [facility];  Service: Orthopedics;  Laterality: Right;  . right carotid endarterectomy    . TONSILLECTOMY      Allergies  Allergen Reactions  . Codeine Hives, Itching and Other (See Comments)    extreme agitation  . Losartan Other (See Comments)    "I can't walk, I can't do anything"   . Oxycodone Hives and Itching  . Penicillins Swelling and Rash    Has patient had a PCN reaction causing immediate rash, facial/tongue/throat swelling, SOB or lightheadedness with hypotension: NO Has patient had a PCN reaction causing severe rash involving mucus membranes or skin necrosis: NO Has patient had a PCN reaction that required hospitalization: NO  Has patient had a PCN reaction occurring within the last 10 years: NO If all of the above answers are "NO", then may proceed with Cephalosporin use.    Marland Kitchen Pentazocine Other (See Comments) and Nausea And Vomiting  . Quinapril Cough    REACTION: cough  . Sibutramine Palpitations    REACTION: palpitations  . Sulfamethoxazole Nausea And Vomiting  hives  . Sulfonamide Derivatives Rash  . Ace Inhibitors Cough    cough  . Eggs Or Egg-Derived Products Hives, Diarrhea and Other (See Comments)    Pt stated "My esophagus swells, I get hives, diarrhea, and I pass out"  . Losartan Potassium     cough  . Other Other (See Comments)    EGGS: passes out, hives    Current Outpatient Medications  Medication Sig Dispense Refill  . acetaminophen (TYLENOL) 500 MG tablet Take 1,000 mg by mouth every 6 (six) hours as needed.    Marland Kitchen aspirin 81 MG tablet Take 81 mg by mouth daily.      Marland Kitchen atenolol (TENORMIN) 50 MG tablet Take 1 tablet (50 mg total) by mouth daily. 90 tablet 3  . beclomethasone (QVAR) 40 MCG/ACT inhaler Inhale 1 puff into the lungs 2 (two) times daily as needed (for breathing difficulty during allergery season).     . bisacodyl (DULCOLAX) 5 MG EC tablet Take 5 mg by mouth daily as needed for moderate constipation.     .  fexofenadine (ALLEGRA) 180 MG tablet Take 180 mg by mouth daily as needed for allergies.     . folic acid (FOLVITE) 169 MCG tablet 400 mcg. Take one tablet by mouth every third day    . furosemide (LASIX) 20 MG tablet Take 0.5 tablets (10 mg total) by mouth daily. 45 tablet 3  . HYDROcodone-acetaminophen (NORCO) 5-325 MG per tablet Take 1-2 tablets by mouth every 4 (four) hours as needed for moderate pain. 60 tablet 0  . ibuprofen (ADVIL,MOTRIN) 200 MG tablet Take 800 mg by mouth every 6 (six) hours as needed.    [name] Aspart-Potassium Aspart (ASPARTATE MG & K) 90-90 MG CAPS Take 1 capsule by mouth once a week. On Mon, Wed, Fri    . meclizine (ANTIVERT) 25 MG tablet Take 25 mg by mouth daily as needed for dizziness.     . Multiple Vitamin (MULTIVITAMIN) capsule Take 1 capsule by mouth every other day.    . Omega-3 Fatty Acids (FISH OIL) 1000 MG CAPS Take 1,000 mg by mouth daily.     . pravastatin (PRAVACHOL) 40 MG tablet Take 1 tablet (40 mg total) by mouth every evening. 90 tablet 3  . triamterene-hydrochlorothiazide (DYAZIDE) 37.5-25 MG capsule Take 1 each (1 capsule total) by mouth daily. 90 capsule 3   No current facility-administered medications for this visit.     Family History  Problem Relation Age of Onset  . Ovarian cancer Mother   . Cancer Mother        Ovarian  . COPD Father   . Aneurysm Father        Throat  . Kidney disease Father   . Heart disease Maternal Grandmother   . Heart disease Maternal Grandfather     Social History   Socioeconomic History  . Marital status: Married    Spouse name: Not on file  . Number of children: Not on file  . Years of education: Not on file  . Highest education level: Not on file  Occupational History  . Not on file  Social Needs  . Financial resource strain: Not on file  . Food insecurity:    Worry: Not on file    Inability: Not on file  . Transportation needs:    Medical: Not on file    Non-medical: Not on file  Tobacco Use   . Smoking status: Never Smoker  . Smokeless tobacco: Never Used  Substance and Sexual Activity  . Alcohol use: No    Alcohol/week: 0.0 standard drinks  . Drug use: No  . Sexual activity: Not on file  Lifestyle  . Physical activity:    Days per week: Not on file    Minutes per session: Not on file  . Stress: Not on file  Relationships  . Social connections:    Talks on phone: Not on file    Gets together: Not on file    Attends religious service: Not on file    Active member of club or organization: Not on file    Attends meetings of clubs or organizations: Not on file    Relationship status: Not on file  . Intimate partner violence:    Fear of current or ex partner: Not on file    Emotionally abused: Not on file    Physically abused: Not on file    Forced sexual activity: Not on file  Other Topics Concern  . Not on file  Social History Narrative  . Not on file     REVIEW OF SYSTEMS:   [X]  denotes positive finding, [ ]  denotes negative finding Cardiac  Comments:  Chest pain or chest pressure:    Shortness of breath upon exertion:    Short of breath when lying flat:    Irregular heart rhythm:        Vascular    Pain in calf, thigh, or hip brought on by ambulation:    Pain in feet at night that wakes you up from your sleep:     Blood clot in your veins:    Leg swelling:         Pulmonary    Oxygen at home:    Productive cough:     Wheezing:         Neurologic    Sudden weakness in arms or legs:     Sudden numbness in arms or legs:     Sudden onset of difficulty speaking or slurred speech:    Temporary loss of vision in one eye:     Problems with dizziness:         Gastrointestinal    Blood in stool:     Vomited blood:         Genitourinary    Burning when urinating:     Blood in urine:        Psychiatric    Major depression:         Hematologic    Bleeding problems:    Problems with blood clotting too easily:        Skin    Rashes or ulcers:         Constitutional    Fever or chills:      PHYSICAL EXAMINATION:  Today's Vitals   07/23/18 1343  BP: (!) 142/73  Pulse: 65  Resp: 16  Temp: 97.8 F (36.6 C)  TempSrc: Oral  SpO2: 93%  Weight: 248 lb 5.6 oz (112.6 kg)  Height: 5' 6.5" (1.689 m)   Body mass index is 39.48 kg/m.   General:  WDWN in NAD; vital signs documented above Gait: walks well with 2 canes HENT: WNL, normocephalic Pulmonary: normal non-labored breathing , without Rales, rhonchi,  wheezing Cardiac: regular HR, with  Murmur  without carotid bruits Abdomen: soft, NT, no masses Skin: without rashes Vascular Exam/Pulses:  Right Left  Radial 2+ (normal) 2+ (normal)  DP 2+ (normal) Faintly palpable  PT Unable to palpate  Unable to palpate    Extremities: without ischemic changes, without Gangrene , without cellulitis; without open wounds;  Musculoskeletal: no muscle wasting or atrophy  Neurologic: A&O X 3 Psychiatric:  The pt has Normal affect.   Non-Invasive Vascular Imaging:   Carotid Duplex on 07/23/2018: Right:  1-39% ICA stenosis Left:  1-39% ICA stenosis Vertebrals:  Bilateral vertebral arteries demonstrate antegrade flow. Subclavians: Normal flow hemodynamics were seen in bilateral subclavian              arteries.  Previous Carotid duplex on 11/12/16: Carotid Duplex (11/12/16): Right carotid endarterectomy site with 1-39% stenosis 1-39% left internal carotid artery stenosis. Bilateral vertebral artery flow is antegrade.  Bilateral subclavian artery waveforms are normal.  No significant change compared to the last exam on 11-07-15.   ASSESSMENT/PLAN:: 85 y.o. female here for follow up carotid artery stenosis.  -pt doing well and remains asymptomatic. -discussed s/s of stroke with pt and they understand should they develop any of these sx, they will go to the nearest ER. -will have pt return in 18 months for repeat carotid duplex.  She will call sooner should she have any  issues.  Leontine Locket, PA-C [facility] [phone]  Clinic MD:  One[facility] Alar

## 2018-07-23 ENCOUNTER — Ambulatory Visit (HOSPITAL_COMMUNITY)
Admission: RE | Admit: 2018-07-23 | Discharge: 2018-07-23 | Disposition: A | Discharge status: Home / Self Care | Payer: Medicare Other | Source: Ambulatory Visit | Attending: Family | Admitting: Family

## 2018-07-23 ENCOUNTER — Ambulatory Visit (INDEPENDENT_AMBULATORY_CARE_PROVIDER_SITE_OTHER): Payer: Medicare Other | Admitting: Physician Assistant

## 2018-07-23 ENCOUNTER — Encounter: Payer: Self-pay | Admitting: Physician Assistant

## 2018-07-23 VITALS — BP 142/73 | HR 65 | Temp 97.8°F | Resp 16 | Ht 66.5 in | Wt 248.3 lb

## 2018-07-23 DIAGNOSIS — I6523 Occlusion and stenosis of bilateral carotid arteries: Secondary | ICD-10-CM | POA: Diagnosis not present

## 2018-07-23 DIAGNOSIS — I6521 Occlusion and stenosis of right carotid artery: Secondary | ICD-10-CM | POA: Diagnosis not present

## 2018-07-23 DIAGNOSIS — Z9889 Other specified postprocedural states: Secondary | ICD-10-CM

## 2018-09-25 DIAGNOSIS — Z299 Encounter for prophylactic measures, unspecified: Secondary | ICD-10-CM | POA: Diagnosis not present

## 2018-09-25 DIAGNOSIS — Z79899 Other long term (current) drug therapy: Secondary | ICD-10-CM | POA: Diagnosis not present

## 2018-09-25 DIAGNOSIS — Z7189 Other specified counseling: Secondary | ICD-10-CM | POA: Diagnosis not present

## 2018-09-25 DIAGNOSIS — Z0000 Encounter for general adult medical examination without abnormal findings: Secondary | ICD-10-CM | POA: Diagnosis not present

## 2018-09-25 DIAGNOSIS — R5383 Other fatigue: Secondary | ICD-10-CM | POA: Diagnosis not present

## 2018-09-25 DIAGNOSIS — Z6829 Body mass index (BMI) 29.0-29.9, adult: Secondary | ICD-10-CM | POA: Diagnosis not present

## 2018-09-25 DIAGNOSIS — Z1339 Encounter for screening examination for other mental health and behavioral disorders: Secondary | ICD-10-CM | POA: Diagnosis not present

## 2018-09-25 DIAGNOSIS — Z1331 Encounter for screening for depression: Secondary | ICD-10-CM | POA: Diagnosis not present

## 2018-09-25 DIAGNOSIS — Z1211 Encounter for screening for malignant neoplasm of colon: Secondary | ICD-10-CM | POA: Diagnosis not present

## 2018-09-25 DIAGNOSIS — I10 Essential (primary) hypertension: Secondary | ICD-10-CM | POA: Diagnosis not present

## 2018-09-25 DIAGNOSIS — E7800 Pure hypercholesterolemia, unspecified: Secondary | ICD-10-CM | POA: Diagnosis not present

## 2018-10-13 ENCOUNTER — Telehealth: Payer: Self-pay | Admitting: *Deleted

## 2018-10-13 NOTE — Progress Notes (Signed)
Virtual Visit via Video Note   This visit type was conducted due to national recommendations for restrictions regarding the COVID-19 Pandemic (e.g. social distancing) in an effort to limit this patient's exposure and mitigate transmission in our community.  Due to her co-morbid illnesses, this patient is at least at moderate risk for complications without adequate follow up.  This format is felt to be most appropriate for this patient at this time.  All issues noted in this document were discussed and addressed.  A limited physical exam was performed with this format.  Please refer to the patient's chart for her consent to telehealth for [facility].   Date:  10/14/2018   ID:  Lori Price, DOB 31-Oct-1931, MRN [id number]  Patient Location: Home Provider Location: Home  PCP:  Glenda Chroman, MD  Cardiologist:  Minus Breeding, MD  Electrophysiologist:  None   Evaluation Performed:  Follow-Up Visit  Chief Complaint:  CAD  History of Present Illness:    Lori Price is a 85 y.o. female who presents for follow up of CAD/CABG.    She was in the hospital in July of 2017 with a TIA.  She had a hypertensive urgency  As part of this evaluation she did have an echo which was unchanged from previous.  She had a stress perfusion study in July 2018 with a small inferior defect possibly a prior MI with mild ischemia.  The EF was 60%.  A BNP level was mildly elevated and I did start her on Lasix.  A sleep study had severe oxygen desaturations and severe periodic limb movements. However, there were some scheduling issues and she never had her CPAP.  I tried to convince her to pursue treatment of this. However, she has not wanted to pursue this.    Since I last saw her she has done well.  The patient denies any new symptoms such as chest discomfort, neck or arm discomfort. There has been no new shortness of breath, PND or orthopnea. There have been no reported palpitations, presyncope or syncope.   She is  working on a [employer].  She is also doing genealogy.  She is trying to stay active during the pandemic.  The patient does not have symptoms concerning for COVID-19 infection (fever, chills, cough, or new shortness of breath).    Past Medical History:  Diagnosis Date  . Arthritis    OA  . Asthma   . BPV (benign positional vertigo)   . Carotid artery occlusion   . Coronary artery disease    s/p MI 2001--(catheterizations in 2001 with left main 70% stenosis, LAD 70% followed by 90% stenosis, circumflex 99% stenosis, right coronary artery occluded.   . Hyperlipidemia   . Hypertension    Dr. Percival Spanish  . Nephrolithiasis   . Peripheral vascular disease ([place])   . Sleep apnea    CPAP   no  . Vertigo    Past Surgical History:  Procedure Laterality Date  . CARDIAC CATHETERIZATION     2001  . CAROTID ENDARTERECTOMY Right    cea  Dr. Amedeo Plenty  . CARPAL TUNNEL RELEASE Right   . CORONARY ARTERY BYPASS GRAFT     2001 (LIMA to LAD, SVG to obtuse marginal, sequential  SVG to RV , marginal branch in the distal right coronary artery.  Marland Kitchen knee arthoscopy    . left carpal tunnel    . ORIF SHOULDER FRACTURE Right 08/30/2013   Procedure: HEMI ARTHROPLASTY;  Surgeon: Remo Lipps  Orlena Sheldon, MD;  Location: [facility];  Service: Orthopedics;  Laterality: Right;  . right carotid endarterectomy    . TONSILLECTOMY      Prior to Admission medications   Medication Sig Start Date End Date Taking? Authorizing Provider  acetaminophen (TYLENOL) 500 MG tablet Take 1,000 mg by mouth every 6 (six) hours as needed.   Yes [provider]  aspirin 81 MG tablet Take 81 mg by mouth daily.     Yes [provider]  atenolol (TENORMIN) 50 MG tablet Take 1 tablet (50 mg total) by mouth daily. 10/15/17  Yes Minus Breeding, MD  beclomethasone (QVAR) 40 MCG/ACT inhaler Inhale 1 puff into the lungs 2 (two) times daily as needed (for breathing difficulty during allergery season).    Yes [provider]   bisacodyl (DULCOLAX) 5 MG EC tablet Take 5 mg by mouth daily as needed for moderate constipation.    Yes [provider]  fexofenadine (ALLEGRA) 180 MG tablet Take 180 mg by mouth daily as needed for allergies.    Yes [provider]  furosemide (LASIX) 20 MG tablet Take 0.5 tablets (10 mg total) by mouth daily. Patient taking differently: 20 mg. Take 1/2 to one tablet daily as needed 10/15/17  Yes Minus Breeding, MD  HYDROcodone-acetaminophen (NORCO) 5-325 MG per tablet Take 1-2 tablets by mouth every 4 (four) hours as needed for moderate pain. 09/01/13  Yes Netta Cedars, MD  ibuprofen (ADVIL,MOTRIN) 200 MG tablet Take 800 mg by mouth every 6 (six) hours as needed.   Yes [provider]  Mag Aspart-Potassium Aspart (ASPARTATE MG & K) 90-90 MG CAPS Take 1 capsule by mouth once a week. Take one tablet as needed   Yes [provider]  meclizine (ANTIVERT) 25 MG tablet Take 25 mg by mouth daily as needed for dizziness.    Yes [provider]  Multiple Vitamin (MULTIVITAMIN) capsule Take 1 capsule by mouth every other day.   Yes [provider]  Omega-3 Fatty Acids (FISH OIL) 1000 MG CAPS Take 1,000 mg by mouth daily.    Yes [provider]  pravastatin (PRAVACHOL) 40 MG tablet Take 1 tablet (40 mg total) by mouth every evening. 10/15/17  Yes Minus Breeding, MD  triamterene-hydrochlorothiazide (DYAZIDE) 37.5-25 MG capsule Take 1 each (1 capsule total) by mouth daily. 10/15/17  Yes Minus Breeding, MD  folic acid (FOLVITE) 937 MCG tablet 400 mcg. Take one tablet by mouth every third day    [provider]     Allergies:   Codeine; Losartan; Oxycodone; Penicillins; Pentazocine; Quinapril; Sibutramine; Sulfamethoxazole; Sulfonamide derivatives; Ace inhibitors; Eggs or egg-derived products; Losartan potassium; and Other   Social History   Tobacco Use  . Smoking status: Never Smoker  . Smokeless tobacco: Never Used  Substance Use  Topics  . Alcohol use: No    Alcohol/week: 0.0 standard drinks  . Drug use: No     Family Hx: The patient's family history includes Aneurysm in her father; COPD in her father; Cancer in her mother; Heart disease in her maternal grandfather and maternal grandmother; Kidney disease in her father; Ovarian cancer in her mother.  ROS:   Please see the history of present illness.    As stated in the HPI and negative for all other systems.   Prior CV studies:   The following studies were reviewed today:    Labs/Other Tests and Data Reviewed:    EKG:  No ECG reviewed.  Recent Labs: No results  found for requested labs within last 8760 hours.   Recent Lipid Panel Lab Results  Component Value Date/Time   CHOL 153 02/08/2016 08:52 AM   TRIG 213 (H) 02/08/2016 08:52 AM   HDL 39 (L) 02/08/2016 08:52 AM   CHOLHDL 3.9 02/08/2016 08:52 AM   CHOLHDL 7.8 12/14/2015 05:42 AM   LDLCALC 71 02/08/2016 08:52 AM   LDLDIRECT 111.8 02/03/2014 09:24 AM    Wt Readings from Last 3 Encounters:  10/14/18 239 lb (108.4 kg)  07/23/18 248 lb 5.6 oz (112.6 kg)  10/15/17 238 lb (108 kg)     Objective:    Vital Signs:  Ht 5' 7.5" (1.715 m)   Wt 239 lb (108.4 kg)   BMI 36.88 kg/m    VITAL SIGNS:  reviewed GEN:  no acute distress RESPIRATORY:  normal respiratory effort, symmetric expansion NEURO:  alert and oriented x 3, no obvious focal deficit PSYCH:  normal affect  ASSESSMENT & PLAN:    CAD:   She is had no new symptoms since a stress test in 2017.  She will continue with risk reduction.  HTN:  The blood pressure is at target from the last reading and she thinks it is fairly well controlled.  She is about to have this checked again and she will let me know.Marland Kitchen   SLEEP APNEA:   She is not on treatment for this.  No change in therapy.  CAROTID STENOSIS:   She is followed by [facility].    DYSLIPIDEMIA: She will get me the results.  She is about to have her labs drawn.  MURMUR: She had mild  AS on echo in 2018.  No change in therapy is indicated.    COVID-19 Education: The signs and symptoms of COVID-19 were discussed with the patient and how to seek care for testing (follow up with PCP or arrange E-visit).  The importance of social distancing was discussed today.  Time:   Today, I have spent 16 minutes with the patient with telehealth technology discussing the above problems.     Medication Adjustments/Labs and Tests Ordered: Current medicines are reviewed at length with the patient today.  Concerns regarding medicines are outlined above.   Tests Ordered: No orders of the defined types were placed in this encounter.   Medication Changes: No orders of the defined types were placed in this encounter.   Disposition:  Follow up with me in one year  Signed, Minus Breeding, MD  10/14/2018 11:01 AM    [place]

## 2018-10-13 NOTE — Telephone Encounter (Signed)
YOUR CARDIOLOGY TEAM HAS ARRANGED FOR AN E-VISIT FOR YOUR APPOINTMENT - PLEASE REVIEW IMPORTANT INFORMATION BELOW SEVERAL DAYS PRIOR TO YOUR APPOINTMENT  Due to the recent COVID-19 pandemic, we are transitioning in-person office visits to tele-medicine visits in an effort to decrease unnecessary exposure to our patients, their families, and staff. These visits are billed to your insurance just like a normal visit is. We also encourage you to sign up for MyChart if you have not already done so. You will need a smartphone if possible. For patients that do not have this, we can still complete the visit using a regular telephone but do prefer a smartphone to enable video when possible. You may have a family member that lives with you that can help. If possible, we also ask that you have a blood pressure cuff and scale at home to measure your blood pressure, heart rate and weight prior to your scheduled appointment. Patients with clinical needs that need an in-person evaluation and testing will still be able to come to the office if absolutely necessary. If you have any questions, feel free to call our office.   YOUR PROVIDER WILL BE USING THE FOLLOWING PLATFORM TO COMPLETE YOUR VISIT: [email or web address]  . IF USING [email or web address] - The staff will give you instructions on receiving your link to join the meeting the day of your visit.    2-3 DAYS BEFORE YOUR APPOINTMENT  You will receive a telephone call from one of our [facility] team members - your caller ID may say "Unknown caller." If this is a video visit, we will walk you through how to get the video launched on your phone. We will remind you check your blood pressure, heart rate and weight prior to your scheduled appointment. If you have an Apple Watch or Kardia, please upload any pertinent ECG strips the day before or morning of your appointment to [facility]. Our staff will also make sure you have reviewed the consent and agree to move forward with your scheduled  tele-health visit.     THE DAY OF YOUR APPOINTMENT  Approximately 15 minutes prior to your scheduled appointment, you will receive a telephone call from one of [facility] team - your caller ID may say "Unknown caller."  Our staff will confirm medications, vital signs for the day and any symptoms you may be experiencing. Please have this information available prior to the time of visit start. It may also be helpful for you to have a pad of paper and pen handy for any instructions given during your visit. They will also walk you through joining the smartphone meeting if this is a video visit.    CONSENT FOR TELE-HEALTH VISIT - PLEASE REVIEW  I hereby voluntarily request, consent and authorize [facility] and its employed or contracted physicians, physician assistants, nurse practitioners or other licensed health care professionals (the Practitioner), to provide me with telemedicine health care services (the "Services") as deemed necessary by the treating Practitioner. I acknowledge and consent to receive the Services by the Practitioner via telemedicine. I understand that the telemedicine visit will involve communicating with the Practitioner through live audiovisual communication technology and the disclosure of certain medical information by electronic transmission. I acknowledge that I have been given the opportunity to request an in-person assessment or other available alternative prior to the telemedicine visit and am voluntarily participating in the telemedicine visit.  I understand that I have the right to withhold or withdraw my consent to the use of telemedicine in the course  of my care at any time, without affecting my right to future care or treatment, and that the Practitioner or I may terminate the telemedicine visit at any time. I understand that I have the right to inspect all information obtained and/or recorded in the course of the telemedicine visit and may receive copies of available  information for a reasonable fee.  I understand that some of the potential risks of receiving the Services via telemedicine include:  Marland Kitchen Delay or interruption in medical evaluation due to technological equipment failure or disruption; . Information transmitted may not be sufficient (e.g. poor resolution of images) to allow for appropriate medical decision making by the Practitioner; and/or  . In rare instances, security protocols could fail, causing a breach of personal health information.  Furthermore, I acknowledge that it is my responsibility to provide information about my medical history, conditions and care that is complete and accurate to the best of my ability. I acknowledge that Practitioner's advice, recommendations, and/or decision may be based on factors not within their control, such as incomplete or inaccurate data provided by me or distortions of diagnostic images or specimens that may result from electronic transmissions. I understand that the practice of medicine is not an exact science and that Practitioner makes no warranties or guarantees regarding treatment outcomes. I acknowledge that I will receive a copy of this consent concurrently upon execution via email to the email address I last provided but may also request a printed copy by calling the office of [facility].    I understand that my insurance will be billed for this visit.   I have read or had this consent read to me. . I understand the contents of this consent, which adequately explains the benefits and risks of the Services being provided via telemedicine.  . I have been provided ample opportunity to ask questions regarding this consent and the Services and have had my questions answered to my satisfaction. . I give my informed consent for the services to be provided through the use of telemedicine in my medical care  By participating in this telemedicine visit I agree to the above. Pt gave verbal consent for virtual  visit.

## 2018-10-14 ENCOUNTER — Encounter: Payer: Self-pay | Admitting: Cardiology

## 2018-10-14 ENCOUNTER — Telehealth (INDEPENDENT_AMBULATORY_CARE_PROVIDER_SITE_OTHER): Payer: Medicare Other | Admitting: Cardiology

## 2018-10-14 VITALS — Ht 67.5 in | Wt 239.0 lb

## 2018-10-14 DIAGNOSIS — I2510 Atherosclerotic heart disease of native coronary artery without angina pectoris: Secondary | ICD-10-CM | POA: Diagnosis not present

## 2018-10-14 DIAGNOSIS — I350 Nonrheumatic aortic (valve) stenosis: Secondary | ICD-10-CM

## 2018-10-14 DIAGNOSIS — I6523 Occlusion and stenosis of bilateral carotid arteries: Secondary | ICD-10-CM | POA: Insufficient documentation

## 2018-10-14 DIAGNOSIS — Z7189 Other specified counseling: Secondary | ICD-10-CM | POA: Insufficient documentation

## 2018-10-14 DIAGNOSIS — G4730 Sleep apnea, unspecified: Secondary | ICD-10-CM

## 2018-10-14 MED ORDER — TRIAMTERENE-HCTZ 37.5-25 MG PO CAPS: 1.0000 | ORAL_CAPSULE | Freq: Every day | ORAL | 3 refills | Status: DC

## 2018-10-14 MED ORDER — ATENOLOL 50 MG PO TABS: 50.0000 mg | ORAL_TABLET | Freq: Every day | ORAL | 3 refills | Status: DC

## 2018-10-14 MED ORDER — FUROSEMIDE 20 MG PO TABS: 20.0000 mg | ORAL_TABLET | Freq: Every day | ORAL | 3 refills | Status: DC

## 2018-10-14 NOTE — Patient Instructions (Signed)
Medication Instructions:  The current medical regimen is effective;  continue present plan and medications.  If you need a refill on your cardiac medications before your next appointment, please call your pharmacy.   Follow-Up: Follow up in 1 year with Dr. Hochrein.  You will receive a letter in the mail 2 months before you are due.  Please call us when you receive this letter to schedule your follow up appointment.  Thank you for choosing [facility]!!     

## 2018-10-23 DIAGNOSIS — Z6837 Body mass index (BMI) 37.0-37.9, adult: Secondary | ICD-10-CM | POA: Diagnosis not present

## 2018-10-23 DIAGNOSIS — Z79899 Other long term (current) drug therapy: Secondary | ICD-10-CM | POA: Diagnosis not present

## 2018-10-23 DIAGNOSIS — Z299 Encounter for prophylactic measures, unspecified: Secondary | ICD-10-CM | POA: Diagnosis not present

## 2018-10-23 DIAGNOSIS — Z713 Dietary counseling and surveillance: Secondary | ICD-10-CM | POA: Diagnosis not present

## 2018-10-23 DIAGNOSIS — I10 Essential (primary) hypertension: Secondary | ICD-10-CM | POA: Diagnosis not present

## 2018-11-16 DIAGNOSIS — I10 Essential (primary) hypertension: Secondary | ICD-10-CM | POA: Diagnosis not present

## 2018-12-16 DIAGNOSIS — I10 Essential (primary) hypertension: Secondary | ICD-10-CM | POA: Diagnosis not present

## 2019-01-27 DIAGNOSIS — B351 Tinea unguium: Secondary | ICD-10-CM | POA: Diagnosis not present

## 2019-01-27 DIAGNOSIS — C44319 Basal cell carcinoma of skin of other parts of face: Secondary | ICD-10-CM | POA: Diagnosis not present

## 2019-01-27 DIAGNOSIS — D1801 Hemangioma of skin and subcutaneous tissue: Secondary | ICD-10-CM | POA: Diagnosis not present

## 2019-01-27 DIAGNOSIS — L918 Other hypertrophic disorders of the skin: Secondary | ICD-10-CM | POA: Diagnosis not present

## 2019-01-27 DIAGNOSIS — L820 Inflamed seborrheic keratosis: Secondary | ICD-10-CM | POA: Diagnosis not present

## 2019-01-27 DIAGNOSIS — L723 Sebaceous cyst: Secondary | ICD-10-CM | POA: Diagnosis not present

## 2019-01-27 DIAGNOSIS — D485 Neoplasm of uncertain behavior of skin: Secondary | ICD-10-CM | POA: Diagnosis not present

## 2019-01-27 DIAGNOSIS — L570 Actinic keratosis: Secondary | ICD-10-CM | POA: Diagnosis not present

## 2019-01-27 DIAGNOSIS — L821 Other seborrheic keratosis: Secondary | ICD-10-CM | POA: Diagnosis not present

## 2019-01-27 DIAGNOSIS — L814 Other melanin hyperpigmentation: Secondary | ICD-10-CM | POA: Diagnosis not present

## 2019-01-27 DIAGNOSIS — L853 Xerosis cutis: Secondary | ICD-10-CM | POA: Diagnosis not present

## 2019-01-29 DIAGNOSIS — E7800 Pure hypercholesterolemia, unspecified: Secondary | ICD-10-CM | POA: Diagnosis not present

## 2019-01-29 DIAGNOSIS — Z789 Other specified health status: Secondary | ICD-10-CM | POA: Diagnosis not present

## 2019-01-29 DIAGNOSIS — Z299 Encounter for prophylactic measures, unspecified: Secondary | ICD-10-CM | POA: Diagnosis not present

## 2019-01-29 DIAGNOSIS — Z6837 Body mass index (BMI) 37.0-37.9, adult: Secondary | ICD-10-CM | POA: Diagnosis not present

## 2019-01-29 DIAGNOSIS — J45909 Unspecified asthma, uncomplicated: Secondary | ICD-10-CM | POA: Diagnosis not present

## 2019-01-29 DIAGNOSIS — B351 Tinea unguium: Secondary | ICD-10-CM | POA: Diagnosis not present

## 2019-01-29 DIAGNOSIS — I10 Essential (primary) hypertension: Secondary | ICD-10-CM | POA: Diagnosis not present

## 2019-02-15 DIAGNOSIS — C44319 Basal cell carcinoma of skin of other parts of face: Secondary | ICD-10-CM | POA: Diagnosis not present

## 2019-02-15 DIAGNOSIS — Z85828 Personal history of other malignant neoplasm of skin: Secondary | ICD-10-CM | POA: Diagnosis not present

## 2019-02-26 DIAGNOSIS — L608 Other nail disorders: Secondary | ICD-10-CM | POA: Diagnosis not present

## 2019-02-26 DIAGNOSIS — Z79899 Other long term (current) drug therapy: Secondary | ICD-10-CM | POA: Diagnosis not present

## 2019-02-26 DIAGNOSIS — Z85828 Personal history of other malignant neoplasm of skin: Secondary | ICD-10-CM | POA: Diagnosis not present

## 2019-02-26 DIAGNOSIS — B351 Tinea unguium: Secondary | ICD-10-CM | POA: Diagnosis not present

## 2019-03-15 DIAGNOSIS — I10 Essential (primary) hypertension: Secondary | ICD-10-CM | POA: Diagnosis not present

## 2019-03-15 DIAGNOSIS — Z6837 Body mass index (BMI) 37.0-37.9, adult: Secondary | ICD-10-CM | POA: Diagnosis not present

## 2019-03-15 DIAGNOSIS — Z299 Encounter for prophylactic measures, unspecified: Secondary | ICD-10-CM | POA: Diagnosis not present

## 2019-03-15 DIAGNOSIS — Z79899 Other long term (current) drug therapy: Secondary | ICD-10-CM | POA: Diagnosis not present

## 2019-03-15 DIAGNOSIS — E7800 Pure hypercholesterolemia, unspecified: Secondary | ICD-10-CM | POA: Diagnosis not present

## 2019-03-18 DIAGNOSIS — Z79899 Other long term (current) drug therapy: Secondary | ICD-10-CM | POA: Diagnosis not present

## 2019-03-18 DIAGNOSIS — E7800 Pure hypercholesterolemia, unspecified: Secondary | ICD-10-CM | POA: Diagnosis not present

## 2019-04-09 DIAGNOSIS — B351 Tinea unguium: Secondary | ICD-10-CM | POA: Diagnosis not present

## 2019-04-09 DIAGNOSIS — Z85828 Personal history of other malignant neoplasm of skin: Secondary | ICD-10-CM | POA: Diagnosis not present

## 2019-06-12 DIAGNOSIS — Z23 Encounter for immunization: Secondary | ICD-10-CM | POA: Diagnosis not present

## 2019-07-12 DIAGNOSIS — Z23 Encounter for immunization: Secondary | ICD-10-CM | POA: Diagnosis not present

## 2019-08-12 DIAGNOSIS — J45909 Unspecified asthma, uncomplicated: Secondary | ICD-10-CM | POA: Diagnosis not present

## 2019-08-12 DIAGNOSIS — Z299 Encounter for prophylactic measures, unspecified: Secondary | ICD-10-CM | POA: Diagnosis not present

## 2019-08-12 DIAGNOSIS — Z6836 Body mass index (BMI) 36.0-36.9, adult: Secondary | ICD-10-CM | POA: Diagnosis not present

## 2019-08-12 DIAGNOSIS — I10 Essential (primary) hypertension: Secondary | ICD-10-CM | POA: Diagnosis not present

## 2019-08-12 DIAGNOSIS — Z713 Dietary counseling and surveillance: Secondary | ICD-10-CM | POA: Diagnosis not present

## 2019-09-30 DIAGNOSIS — Z0000 Encounter for general adult medical examination without abnormal findings: Secondary | ICD-10-CM | POA: Diagnosis not present

## 2019-09-30 DIAGNOSIS — I10 Essential (primary) hypertension: Secondary | ICD-10-CM | POA: Diagnosis not present

## 2019-09-30 DIAGNOSIS — Z1211 Encounter for screening for malignant neoplasm of colon: Secondary | ICD-10-CM | POA: Diagnosis not present

## 2019-09-30 DIAGNOSIS — Z79899 Other long term (current) drug therapy: Secondary | ICD-10-CM | POA: Diagnosis not present

## 2019-09-30 DIAGNOSIS — Z1339 Encounter for screening examination for other mental health and behavioral disorders: Secondary | ICD-10-CM | POA: Diagnosis not present

## 2019-09-30 DIAGNOSIS — R5383 Other fatigue: Secondary | ICD-10-CM | POA: Diagnosis not present

## 2019-09-30 DIAGNOSIS — E7800 Pure hypercholesterolemia, unspecified: Secondary | ICD-10-CM | POA: Diagnosis not present

## 2019-09-30 DIAGNOSIS — Z1331 Encounter for screening for depression: Secondary | ICD-10-CM | POA: Diagnosis not present

## 2019-09-30 DIAGNOSIS — Z299 Encounter for prophylactic measures, unspecified: Secondary | ICD-10-CM | POA: Diagnosis not present

## 2019-09-30 DIAGNOSIS — Z7189 Other specified counseling: Secondary | ICD-10-CM | POA: Diagnosis not present

## 2019-09-30 DIAGNOSIS — Z6836 Body mass index (BMI) 36.0-36.9, adult: Secondary | ICD-10-CM | POA: Diagnosis not present

## 2019-12-21 NOTE — Progress Notes (Signed)
Cardiology Office Note   Date:  12/22/2019   ID:  Lori Price, DOB 10-19-1931, MRN [id number]  PCP:  Glenda Chroman, MD  Cardiologist:   Minus Breeding, MD   Chief Complaint  Patient presents with  . Coronary Artery Disease      History of Present Illness: Lori Price is a 86 y.o. female who presents for follow up of CAD/CABG.    She was in the hospital in July of 2017 with a TIA.  She had a hypertensive urgency  As part of this evaluation she did have an echo which was unchanged from previous.  She had a stress perfusion study in July 2018 with a small inferior defect possibly a prior MI with mild ischemia.  The EF was 60%.  A BNP level was mildly elevated and I did start her on Lasix.  A sleep study had severe oxygen desaturations and severe periodic limb movements. However, there were some scheduling issues and she never had her CPAP.  I tried to convince her to pursue treatment of this. However, she has not wanted to pursue this.    Since I last saw her she has done okay.  She moved to a retirement community in [place].  She has downsized.  Is doing this move she has been more active than she had been previously.  She still walks with a walker with canes.  Of note her blood pressure was not well controlled and her primary care provider did increase her atenolol and this seems to have worked to bring her pressure down.  She is not having any new shortness of breath, PND or orthopnea.  She is not having any palpitations, presyncope or syncope.  She denies any chest pressure, neck or arm discomfort.   Past Medical History:  Diagnosis Date  . Arthritis    OA  . Asthma   . BPV (benign positional vertigo)   . Carotid artery occlusion   . Coronary artery disease    s/p MI 2001--(catheterizations in 2001 with left main 70% stenosis, LAD 70% followed by 90% stenosis, circumflex 99% stenosis, right coronary artery occluded.   . Hyperlipidemia   . Hypertension    Dr. Percival Spanish  .  Nephrolithiasis   . Peripheral vascular disease ([place])   . Sleep apnea    CPAP   no  . Vertigo     Past Surgical History:  Procedure Laterality Date  . CARDIAC CATHETERIZATION     2001  . CAROTID ENDARTERECTOMY Right    cea  Dr. Amedeo Plenty  . CARPAL TUNNEL RELEASE Right   . CORONARY ARTERY BYPASS GRAFT     2001 (LIMA to LAD, SVG to obtuse marginal, sequential  SVG to RV , marginal branch in the distal right coronary artery.  Marland Kitchen knee arthoscopy    . left carpal tunnel    . ORIF SHOULDER FRACTURE Right 08/30/2013   Procedure: HEMI ARTHROPLASTY;  Surgeon: [name], MD;  Location: [facility];  Service: Orthopedics;  Laterality: Right;  . right carotid endarterectomy    . TONSILLECTOMY       Current Outpatient Medications  Medication Sig Dispense Refill  . acetaminophen (TYLENOL) 500 MG tablet Take 1,000 mg by mouth every 6 (six) hours as needed.    Marland Kitchen aspirin 81 MG tablet Take 81 mg by mouth daily.      Marland Kitchen atenolol-chlorthalidone (TENORETIC) 100-25 MG tablet Take 1 tablet by mouth daily. 90 tablet 3  . atorvastatin (LIPITOR)  10 MG tablet Take 1 tablet (10 mg total) by mouth daily. 90 tablet 3  . bisacodyl (DULCOLAX) 5 MG EC tablet Take 5 mg by mouth daily as needed for moderate constipation.     . fexofenadine (ALLEGRA) 180 MG tablet Take 180 mg by mouth daily as needed for allergies.     . folic acid (FOLVITE) 962 MCG tablet 400 mcg. Take one tablet by mouth every third day    . furosemide (LASIX) 20 MG tablet Take 1 tablet (20 mg total) by mouth daily. Take 1/2 to one tablet daily as needed 45 tablet 3  . ibuprofen (ADVIL,MOTRIN) 200 MG tablet Take 800 mg by mouth every 6 (six) hours as needed.    [name] Aspart-Potassium Aspart (ASPARTATE MG & K) 90-90 MG CAPS Take 1 capsule by mouth once a week. Take one tablet as needed    . meclizine (ANTIVERT) 25 MG tablet Take 25 mg by mouth daily as needed for dizziness.     . Multiple Vitamin (MULTIVITAMIN) capsule Take 1 capsule by mouth every  other day.    . Omega-3 Fatty Acids (FISH OIL) 1000 MG CAPS Take 1,000 mg by mouth daily.      No current facility-administered medications for this visit.    Allergies:   Codeine, Losartan, Oxycodone, Penicillins, Pentazocine, Quinapril, Sibutramine, Sulfamethoxazole, Sulfonamide derivatives, Ace inhibitors, Eggs or egg-derived products, Losartan potassium, and Other    ROS:  Please see the history of present illness.   Otherwise, review of systems are positive for none.   All other systems are reviewed and negative.    PHYSICAL EXAM: VS:  BP 110/68   Pulse 64   Ht 5' 7.5" (1.715 m)   Wt 230 lb (104.3 kg)   SpO2 92%   BMI 35.49 kg/m  , BMI Body mass index is 35.49 kg/m. GENERAL:  Well appearing NECK:  No jugular venous distention, waveform within normal limits, carotid upstroke brisk and symmetric, bilateral bruits, no thyromegaly LUNGS:  Clear to auscultation bilaterally CHEST:  Unremarkable HEART:  PMI not displaced or sustained,S1 and S2 within normal limits, no S3, no S4, no clicks, no rubs, 3 of 6 apical systolic murmur radiating slightly at the aortic outflow tract, no diastolic murmurs ABD:  Flat, positive bowel sounds normal in frequency in pitch, no bruits, no rebound, no guarding, no midline pulsatile mass, no hepatomegaly, no splenomegaly EXT:  2 plus pulses throughout, trace leg edema, no cyanosis no clubbing   EKG:  EKG is ordered today. The ekg ordered today demonstrates sinus rhythm, rate 64, axis within normal limits, intervals within normal limits, old inferior infarct, lateral T wave inversions unchanged from previous.   Recent Labs: No results found for requested labs within last 8760 hours.    Lipid Panel    Component Value Date/Time   CHOL 153 02/08/2016 0852   TRIG 213 (H) 02/08/2016 0852   HDL 39 (L) 02/08/2016 0852   CHOLHDL 3.9 02/08/2016 0852   CHOLHDL 7.8 12/14/2015 0542   VLDL UNABLE TO CALCULATE IF TRIGLYCERIDE OVER 400 mg/dL 12/14/2015 0542    LDLCALC 71 02/08/2016 0852   LDLDIRECT 111.8 02/03/2014 0924      Wt Readings from Last 3 Encounters:  12/22/19 230 lb (104.3 kg)  10/14/18 239 lb (108.4 kg)  07/23/18 248 lb 5.6 oz (112.6 kg)      Other studies Reviewed: Additional studies/ records that were reviewed today include: Labs. Review of the above records demonstrates:  Please see elsewhere  in the note.     ASSESSMENT AND PLAN:  CAD:   The patient has no new sypmtoms.  No further cardiovascular testing is indicated.  We will continue with aggressive risk reduction and meds as listed.  HTN: The blood pressure is  at target.  No change in therapy.   SLEEP APNEA:  She has not wanted treatment for this.  No change in therapy.  CAROTID STENOSIS:   She is followed by [facility].     DYSLIPIDEMIA: Her total cholesterol is 94.  She will continue the meds as listed.   MURMUR: She had mild AS on echoin 2018.  The murmur sounds more prominent and I will check a follow-up echocardiogram.  COVID EDUCATION: She has been vaccinated.    Current medicines are reviewed at length with the patient today.  The patient does not have concerns regarding medicines.  The following changes have been made:  no change  Labs/ tests ordered today include:   Orders Placed This Encounter  Procedures  . EKG 12-Lead  . ECHOCARDIOGRAM COMPLETE     Disposition:   FU with me in one year.     Signed, Minus Breeding, MD  12/22/2019 5:03 PM    [facility]

## 2019-12-22 ENCOUNTER — Encounter: Payer: Self-pay | Admitting: Cardiology

## 2019-12-22 ENCOUNTER — Ambulatory Visit (INDEPENDENT_AMBULATORY_CARE_PROVIDER_SITE_OTHER): Payer: Medicare Other | Admitting: Cardiology

## 2019-12-22 ENCOUNTER — Other Ambulatory Visit: Payer: Self-pay

## 2019-12-22 VITALS — BP 110/68 | HR 64 | Ht 67.5 in | Wt 230.0 lb

## 2019-12-22 DIAGNOSIS — I10 Essential (primary) hypertension: Secondary | ICD-10-CM | POA: Diagnosis not present

## 2019-12-22 DIAGNOSIS — I2510 Atherosclerotic heart disease of native coronary artery without angina pectoris: Secondary | ICD-10-CM | POA: Diagnosis not present

## 2019-12-22 DIAGNOSIS — Z7189 Other specified counseling: Secondary | ICD-10-CM | POA: Diagnosis not present

## 2019-12-22 DIAGNOSIS — E785 Hyperlipidemia, unspecified: Secondary | ICD-10-CM | POA: Diagnosis not present

## 2019-12-22 DIAGNOSIS — I350 Nonrheumatic aortic (valve) stenosis: Secondary | ICD-10-CM | POA: Diagnosis not present

## 2019-12-22 MED ORDER — ATENOLOL-CHLORTHALIDONE 100-25 MG PO TABS: 1.0000 | ORAL_TABLET | Freq: Every day | ORAL | 3 refills | Status: DC

## 2019-12-22 MED ORDER — ATORVASTATIN CALCIUM 10 MG PO TABS: 10.0000 mg | ORAL_TABLET | Freq: Every day | ORAL | 3 refills | Status: DC

## 2019-12-22 NOTE — Patient Instructions (Signed)
Medication Instructions:  The current medical regimen is effective;  continue present plan and medications.  *If you need a refill on your cardiac medications before your next appointment, please call your pharmacy*  Testing/Procedures: Your physician has requested that you have an echocardiogram. Echocardiography is a painless test that uses sound waves to create images of your heart. It provides your doctor with information about the size and shape of your heart and how well your heart's chambers and valves are working. This procedure takes approximately one hour. There are no restrictions for this procedure.  Follow-Up: At [facility], you and your health needs are our priority.  As part of our continuing mission to provide you with exceptional heart care, we have created designated Provider Care Teams.  These Care Teams include your primary Cardiologist (physician) and Advanced Practice Providers (APPs -  Physician Assistants and Nurse Practitioners) who all work together to provide you with the care you need, when you need it.  We recommend signing up for the patient portal called "MyChart".  Sign up information is provided on this After Visit Summary.  MyChart is used to connect with patients for Virtual Visits (Telemedicine).  Patients are able to view lab/test results, encounter notes, upcoming appointments, etc.  Non-urgent messages can be sent to your provider as well.   To learn more about what you can do with MyChart, go to [email or web address].    Your next appointment:   12 month(s)  The format for your next appointment:   In Person  Provider: James Hochrein, MD  Thank you for choosing [facility]!!     

## 2020-01-04 ENCOUNTER — Other Ambulatory Visit: Payer: Self-pay

## 2020-01-04 ENCOUNTER — Ambulatory Visit (HOSPITAL_COMMUNITY): Payer: Medicare Other | Attending: Internal Medicine

## 2020-01-04 DIAGNOSIS — I350 Nonrheumatic aortic (valve) stenosis: Secondary | ICD-10-CM | POA: Insufficient documentation

## 2020-01-04 LAB — ECHOCARDIOGRAM COMPLETE
AR max vel: 0.83 cm2
AV Area VTI: 0.88 cm2
AV Area mean vel: 0.79 cm2
AV Mean grad: 37 mmHg
AV Peak grad: 64 mmHg
Ao pk vel: 4 m/s
Area-P 1/2: 2.24 cm2
P 1/2 time: 473 msec
S' Lateral: 2.7 cm

## 2020-01-07 DIAGNOSIS — Z299 Encounter for prophylactic measures, unspecified: Secondary | ICD-10-CM | POA: Diagnosis not present

## 2020-01-07 DIAGNOSIS — Z789 Other specified health status: Secondary | ICD-10-CM | POA: Diagnosis not present

## 2020-01-07 DIAGNOSIS — J069 Acute upper respiratory infection, unspecified: Secondary | ICD-10-CM | POA: Diagnosis not present

## 2020-02-15 ENCOUNTER — Other Ambulatory Visit: Payer: Self-pay

## 2020-02-15 DIAGNOSIS — I6523 Occlusion and stenosis of bilateral carotid arteries: Secondary | ICD-10-CM

## 2020-02-25 DIAGNOSIS — Z23 Encounter for immunization: Secondary | ICD-10-CM | POA: Diagnosis not present

## 2020-02-29 ENCOUNTER — Ambulatory Visit (HOSPITAL_COMMUNITY)
Admission: RE | Admit: 2020-02-29 | Discharge: 2020-02-29 | Disposition: A | Discharge status: Home / Self Care | Payer: Medicare Other | Source: Ambulatory Visit | Attending: Vascular Surgery | Admitting: Vascular Surgery

## 2020-02-29 ENCOUNTER — Other Ambulatory Visit: Payer: Self-pay

## 2020-02-29 ENCOUNTER — Ambulatory Visit (INDEPENDENT_AMBULATORY_CARE_PROVIDER_SITE_OTHER): Payer: Medicare Other | Admitting: Physician Assistant

## 2020-02-29 VITALS — BP 123/72 | HR 55 | Temp 98.2°F | Resp 20 | Ht 67.5 in | Wt 230.1 lb

## 2020-02-29 DIAGNOSIS — I6523 Occlusion and stenosis of bilateral carotid arteries: Secondary | ICD-10-CM | POA: Diagnosis not present

## 2020-02-29 NOTE — Progress Notes (Signed)
Office Note     CC:  follow up Requesting Provider:  Glenda Chroman, MD  HPI: Lori Price is a 86 y.o. (01/02/1932) female who presents for follow up of carotid stenosis. She has history of right CEA by Dr. Amedeo Plenty on 05/22/05. She has no history of stroke or TIA. She denies. She states she is doing well and denies any amaurosis fugax, speech difficulties, facial droop, weakness or hemiparesis.   She denies any claudication symptoms or rest pain. She has long history of venous insufficiency and lower extremity swelling right greater than left secondary to vein harvest for her CABG 25 years ago. The swelling is improved with elevation. She has noticed recently some "bluish" coloration changes in her toes over past couple months. She denies any pain, coldness or numbness. She continues to see cardiology regularly for her Aortic stenosis. She denies any chest pain or shortness of breath  The pt is on a statin for cholesterol management.  The pt is on a daily aspirin.   Other AC:  none The pt is on BB for hypertension.   The pt is not diabetic.   Tobacco hx:  never  Past Medical History:  Diagnosis Date  . Arthritis    OA  . Asthma   . BPV (benign positional vertigo)   . Carotid artery occlusion   . Coronary artery disease    s/p MI 2001--(catheterizations in 2001 with left main 70% stenosis, LAD 70% followed by 90% stenosis, circumflex 99% stenosis, right coronary artery occluded.   . Hyperlipidemia   . Hypertension    Dr. Percival Spanish  . Nephrolithiasis   . Peripheral vascular disease ([place])   . Sleep apnea    CPAP   no  . Vertigo     Past Surgical History:  Procedure Laterality Date  . CARDIAC CATHETERIZATION     2001  . CAROTID ENDARTERECTOMY Right    cea  Dr. Amedeo Plenty  . CARPAL TUNNEL RELEASE Right   . CORONARY ARTERY BYPASS GRAFT     2001 (LIMA to LAD, SVG to obtuse marginal, sequential  SVG to RV , marginal branch in the distal right coronary artery.  Marland Kitchen knee arthoscopy    . left  carpal tunnel    . ORIF SHOULDER FRACTURE Right 08/30/2013   Procedure: HEMI ARTHROPLASTY;  Surgeon: [name], MD;  Location: [facility];  Service: Orthopedics;  Laterality: Right;  . right carotid endarterectomy    . TONSILLECTOMY      Social History   Socioeconomic History  . Marital status: Married    Spouse name: Not on file  . Number of children: Not on file  . Years of education: Not on file  . Highest education level: Not on file  Occupational History  . Not on file  Tobacco Use  . Smoking status: Never Smoker  . Smokeless tobacco: Never Used  Vaping Use  . Vaping Use: Never used  Substance and Sexual Activity  . Alcohol use: No    Alcohol/week: 0.0 standard drinks  . Drug use: No  . Sexual activity: Not on file  Other Topics Concern  . Not on file  Social History Narrative  . Not on file   Social Determinants of Health   Financial Resource Strain:   . Difficulty of Paying Living Expenses: Not on file  Food Insecurity:   . Worried About Charity fundraiser in the Last Year: Not on file  . Ran Out of Food in the  Last Year: Not on file  Transportation Needs:   . Lack of Transportation (Medical): Not on file  . Lack of Transportation (Non-Medical): Not on file  Physical Activity:   . Days of Exercise per Week: Not on file  . Minutes of Exercise per Session: Not on file  Stress:   . Feeling of Stress : Not on file  Social Connections:   . Frequency of Communication with Friends and Family: Not on file  . Frequency of Social Gatherings with Friends and Family: Not on file  . Attends Religious Services: Not on file  . Active Member of Clubs or Organizations: Not on file  . Attends Archivist Meetings: Not on file  . Marital Status: Not on file  Intimate Partner Violence:   . Fear of Current or Ex-Partner: Not on file  . Emotionally Abused: Not on file  . Physically Abused: Not on file  . Sexually Abused: Not on file   Family History  Problem  Relation Age of Onset  . Ovarian cancer Mother   . Cancer Mother        Ovarian  . COPD Father   . Aneurysm Father        Throat  . Kidney disease Father   . Heart disease Maternal Grandmother   . Heart disease Maternal Grandfather     Current Outpatient Medications  Medication Sig Dispense Refill  . acetaminophen (TYLENOL) 500 MG tablet Take 1,000 mg by mouth every 6 (six) hours as needed.    Marland Kitchen aspirin 81 MG tablet Take 81 mg by mouth daily.      Marland Kitchen atenolol-chlorthalidone (TENORETIC) 100-25 MG tablet Take 1 tablet by mouth daily. 90 tablet 3  . atorvastatin (LIPITOR) 10 MG tablet Take 1 tablet (10 mg total) by mouth daily. 90 tablet 3  . bisacodyl (DULCOLAX) 5 MG EC tablet Take 5 mg by mouth daily as needed for moderate constipation.     . fexofenadine (ALLEGRA) 180 MG tablet Take 180 mg by mouth daily as needed for allergies.     . folic acid (FOLVITE) 841 MCG tablet 400 mcg. Take one tablet by mouth every third day    . furosemide (LASIX) 20 MG tablet Take 1 tablet (20 mg total) by mouth daily. Take 1/2 to one tablet daily as needed 45 tablet 3  . ibuprofen (ADVIL,MOTRIN) 200 MG tablet Take 800 mg by mouth every 6 (six) hours as needed.    [name] Aspart-Potassium Aspart (ASPARTATE MG & K) 90-90 MG CAPS Take 1 capsule by mouth once a week. Take one tablet as needed    . meclizine (ANTIVERT) 25 MG tablet Take 25 mg by mouth daily as needed for dizziness.     . Multiple Vitamin (MULTIVITAMIN) capsule Take 1 capsule by mouth every other day.    . Omega-3 Fatty Acids (FISH OIL) 1000 MG CAPS Take 1,000 mg by mouth daily.      No current facility-administered medications for this visit.    Allergies  Allergen Reactions  . Codeine Hives, Itching and Other (See Comments)    extreme agitation  . Losartan Other (See Comments)    "I can't walk, I can't do anything"   . Oxycodone Hives and Itching  . Penicillins Swelling and Rash    Has patient had a PCN reaction causing immediate rash,  facial/tongue/throat swelling, SOB or lightheadedness with hypotension: NO Has patient had a PCN reaction causing severe rash involving mucus membranes or skin necrosis: NO Has  patient had a PCN reaction that required hospitalization: NO  Has patient had a PCN reaction occurring within the last 10 years: NO If all of the above answers are "NO", then may proceed with Cephalosporin use.    Marland Kitchen Pentazocine Other (See Comments) and Nausea And Vomiting  . Quinapril Cough    REACTION: cough  . Sibutramine Palpitations    REACTION: palpitations  . Sulfamethoxazole Nausea And Vomiting    hives  . Sulfonamide Derivatives Rash  . Ace Inhibitors Cough    cough  . Eggs Or Egg-Derived Products Hives, Diarrhea and Other (See Comments)    Pt stated "My esophagus swells, I get hives, diarrhea, and I pass out"  . Losartan Potassium     cough  . Other Other (See Comments)    EGGS: passes out, hives     REVIEW OF SYSTEMS:  [X]  denotes positive finding, [ ]  denotes negative finding Cardiac  Comments:  Chest pain or chest pressure:    Shortness of breath upon exertion:    Short of breath when lying flat:    Irregular heart rhythm:        Vascular    Pain in calf, thigh, or hip brought on by ambulation:    Pain in feet at night that wakes you up from your sleep:     Blood clot in your veins:    Leg swelling:         Pulmonary    Oxygen at home:    Productive cough:     Wheezing:         Neurologic    Sudden weakness in arms or legs:     Sudden numbness in arms or legs:     Sudden onset of difficulty speaking or slurred speech:    Temporary loss of vision in one eye:     Problems with dizziness:         Gastrointestinal    Blood in stool:     Vomited blood:         Genitourinary    Burning when urinating:     Blood in urine:        Psychiatric    Major depression:         Hematologic    Bleeding problems:    Problems with blood clotting too easily:        Skin    Rashes or  ulcers:        Constitutional    Fever or chills:      PHYSICAL EXAMINATION:  Vitals:   02/29/20 1353 02/29/20 1356  BP: 132/72 123/72  Pulse: (!) 55   Resp: 20   Temp: 98.2 F (36.8 C)   TempSrc: Temporal   SpO2: 93%   Weight: 230 lb 1.6 oz (104.4 kg)   Height: 5' 7.5" (1.715 m)     General:  WDWN in NAD; vital signs documented above Gait: Normal, walks with 2 canes HENT: WNL, normocephalic Pulmonary: normal non-labored breathing , without Rales, rhonchi,  wheezing Cardiac: regular HR, with  Murmurs without carotid bruit Abdomen: obese, soft, NT, no masses Vascular Exam/Pulses:  Right Left  Radial 2+ (normal) 2+ (normal)  Ulnar 2+ (normal) 2+ (normal)  Femoral 2+ (normal) 2+ (normal)  Popliteal 2+ (normal) 2+ (normal)  DP 2+ (normal) 2+ (normal)  PT 1+ (weak) 1+ (weak)   Extremities: without ischemic changes, without Gangrene , without cellulitis; without open wounds; edema of right greater than left leg. Bilateral legs with  varicose veins. Mild cyanosis of toes. Feet warm and well perfused. Motor and sensory intact Musculoskeletal: no muscle wasting or atrophy  Neurologic: A&O X 3;  No focal weakness or paresthesias are detected Psychiatric:  The pt has Normal affect.   Non-Invasive Vascular Imaging:  02/29/20 Summary:  Right Carotid: Velocities in the right ICA are consistent with a 1-39% stenosis.   Left Carotid: Velocities in the left ICA are consistent with a 1-39% stenosis.   Vertebrals: Bilateral vertebral arteries demonstrate antegrade flow.    ASSESSMENT/PLAN:: 86 y.o. female here for follow up for carotid stenosis. She has history of right CEA in 2007. She remains asymptomatic. Encourage elevation of lower extremities. Extremities are well perfused and warm. - she will continue her Aspirin and statin --reviewed s/s of stroke with pt and they understand should they develop any of these sx, they will go to the nearest ER. - she will follow up in 1 year  with carotid duplex   Karoline Caldwell, PA-C [facility] [phone]  Clinic MD: Dr. Carlis Abbott

## 2020-03-23 DIAGNOSIS — L089 Local infection of the skin and subcutaneous tissue, unspecified: Secondary | ICD-10-CM | POA: Diagnosis not present

## 2020-03-23 DIAGNOSIS — Z299 Encounter for prophylactic measures, unspecified: Secondary | ICD-10-CM | POA: Diagnosis not present

## 2020-03-23 DIAGNOSIS — L723 Sebaceous cyst: Secondary | ICD-10-CM | POA: Diagnosis not present

## 2020-03-23 DIAGNOSIS — Z2821 Immunization not carried out because of patient refusal: Secondary | ICD-10-CM | POA: Diagnosis not present

## 2020-03-23 DIAGNOSIS — I10 Essential (primary) hypertension: Secondary | ICD-10-CM | POA: Diagnosis not present

## 2020-03-23 DIAGNOSIS — Z6837 Body mass index (BMI) 37.0-37.9, adult: Secondary | ICD-10-CM | POA: Diagnosis not present

## 2020-04-04 ENCOUNTER — Other Ambulatory Visit: Payer: Self-pay | Admitting: Cardiology

## 2020-04-21 DIAGNOSIS — L089 Local infection of the skin and subcutaneous tissue, unspecified: Secondary | ICD-10-CM | POA: Diagnosis not present

## 2020-04-21 DIAGNOSIS — L723 Sebaceous cyst: Secondary | ICD-10-CM | POA: Diagnosis not present

## 2020-06-07 NOTE — Progress Notes (Unsigned)
Cardiology Office Note   Date:  06/08/2020   ID:  Satia, Winger May 20, 1932, MRN [id number]  PCP:  Glenda Chroman, MD  Cardiologist:   Minus Breeding, MD   Chief Complaint  Patient presents with  . Aortic Stenosis      History of Present Illness: Lori Price is a 87 y.o. female who presents for follow up of CAD/CABG.    She was in the hospital in July of 2017 with a TIA.  She had a hypertensive urgency  As part of this evaluation she did have an echo which was unchanged from previous.  She had a stress perfusion study in July 2018 with a small inferior defect possibly a prior MI with mild ischemia.  The EF was 60%.  A BNP level was mildly elevated and I did start her on Lasix.  A sleep study had severe oxygen desaturations and severe periodic limb movements. However, there were some scheduling issues and she never had her CPAP.  I tried to convince her to pursue treatment of this. However, she has not wanted to pursue this.    Since I last saw her she has done okay.  She does live at [facility].  She does have shortness of breath if she walks 100 yards from her car to the dining hall.  She says she recovers relatively quickly.  She does not have new PND or orthopnea.  She does not have any of the chest discomfort that she had prior to her bypass.  She has not had any new palpitations, presyncope or syncope.  Has had some mild chronic lower extremity swelling.   Past Medical History:  Diagnosis Date  . Arthritis    OA  . Asthma   . BPV (benign positional vertigo)   . Carotid artery occlusion   . Coronary artery disease    s/p MI 2001--(catheterizations in 2001 with left main 70% stenosis, LAD 70% followed by 90% stenosis, circumflex 99% stenosis, right coronary artery occluded.   . Hyperlipidemia   . Hypertension    Dr. Percival Spanish  . Nephrolithiasis   . Peripheral vascular disease ([place])   . Sleep apnea    CPAP   no  . Vertigo     Past Surgical History:  Procedure  Laterality Date  . CARDIAC CATHETERIZATION     2001  . CAROTID ENDARTERECTOMY Right    cea  Dr. Amedeo Plenty  . CARPAL TUNNEL RELEASE Right   . CORONARY ARTERY BYPASS GRAFT     2001 (LIMA to LAD, SVG to obtuse marginal, sequential  SVG to RV , marginal branch in the distal right coronary artery.  Marland Kitchen knee arthoscopy    . left carpal tunnel    . ORIF SHOULDER FRACTURE Right 08/30/2013   Procedure: HEMI ARTHROPLASTY;  Surgeon: [name], MD;  Location: [facility];  Service: Orthopedics;  Laterality: Right;  . right carotid endarterectomy    . TONSILLECTOMY       Current Outpatient Medications  Medication Sig Dispense Refill  . acetaminophen (TYLENOL) 500 MG tablet Take 1,000 mg by mouth every 6 (six) hours as needed.    Marland Kitchen aspirin 81 MG tablet Take 81 mg by mouth daily.      Marland Kitchen atenolol-chlorthalidone (TENORETIC) 100-25 MG tablet Take 1 tablet by mouth daily. 90 tablet 3  . atorvastatin (LIPITOR) 10 MG tablet Take 1 tablet (10 mg total) by mouth daily. 90 tablet 3  . bisacodyl (DULCOLAX) 5 MG EC tablet  Take 5 mg by mouth daily as needed for moderate constipation.    . fexofenadine (ALLEGRA) 180 MG tablet Take 180 mg by mouth daily as needed for allergies.    . folic acid (FOLVITE) 161 MCG tablet 400 mcg. Take one tablet by mouth every third day    . furosemide (LASIX) 20 MG tablet TAKE (1/2) TO (1) TABLET DAILY AS NEEDED. 45 tablet 10  . ibuprofen (ADVIL,MOTRIN) 200 MG tablet Take 800 mg by mouth every 6 (six) hours as needed.    [name] Aspart-Potassium Aspart (ASPARTATE MG & K) 90-90 MG CAPS Take 1 capsule by mouth once a week. Take one tablet as needed    . meclizine (ANTIVERT) 25 MG tablet Take 25 mg by mouth daily as needed for dizziness.    . Multiple Vitamin (MULTIVITAMIN) capsule Take 1 capsule by mouth every other day.    . Omega-3 Fatty Acids (FISH OIL) 1000 MG CAPS Take 1,000 mg by mouth daily.     No current facility-administered medications for this visit.    Allergies:   Codeine,  Losartan, Oxycodone, Penicillins, Pentazocine, Quinapril, Sibutramine, Sulfamethoxazole, Sulfonamide derivatives, Ace inhibitors, Eggs or egg-derived products, Losartan potassium, and Other    ROS:  Please see the history of present illness.   Otherwise, review of systems are positive for none.   All other systems are reviewed and negative.    PHYSICAL EXAM: VS:  BP 122/72   Pulse 61   Ht 5\' 7"  (1.702 m)   Wt 231 lb 3.2 oz (104.9 kg)   SpO2 97%   BMI 36.21 kg/m  , BMI Body mass index is 36.21 kg/m. GEN:  No distress NECK:  No jugular venous distention at 90 degrees, waveform within normal limits, carotid upstroke brisk and symmetric, no bruits, no thyromegaly LYMPHATICS:  No cervical adenopathy LUNGS:  Clear to auscultation bilaterally BACK:  No CVA tenderness CHEST:  Well healed sternotomy scar. HEART:  S1 and S2 within normal limits, no S3, no S4, no clicks, no rubs, harsh 3 out of 6 apical systolic murmur mid peaking, no diastolic murmurs ABD:  Positive bowel sounds normal in frequency in pitch, no bruits, no rebound, no guarding, unable to assess midline mass or bruit with the patient seated. EXT:  2 plus pulses throughout, mild right greater than left leg edema, no cyanosis no clubbing SKIN:  No rashes no nodules NEURO:  Cranial nerves II through XII grossly intact, motor grossly intact throughout PSYCH:  Cognitively intact, oriented to person place and time   EKG:  EKG is  ordered today. The ekg ordered today demonstrates sinus rhythm, rate 59, axis within normal limits, intervals within normal limits, old inferior infarct, lateral T wave inversions unchanged from previous. PACs   Recent Labs: No results found for requested labs within last 8760 hours.    Lipid Panel    Component Value Date/Time   CHOL 153 02/08/2016 0852   TRIG 213 (H) 02/08/2016 0852   HDL 39 (L) 02/08/2016 0852   CHOLHDL 3.9 02/08/2016 0852   CHOLHDL 7.8 12/14/2015 0542   VLDL UNABLE TO CALCULATE  IF TRIGLYCERIDE OVER 400 mg/dL 12/14/2015 0542   LDLCALC 71 02/08/2016 0852   LDLDIRECT 111.8 02/03/2014 0924      Wt Readings from Last 3 Encounters:  06/08/20 231 lb 3.2 oz (104.9 kg)  02/29/20 230 lb 1.6 oz (104.4 kg)  12/22/19 230 lb (104.3 kg)      Other studies Reviewed: Additional studies/ records that were  reviewed today include: Labs. Review of the above records demonstrates:  Please see elsewhere in the note.     ASSESSMENT AND PLAN:  CAD:    The patient has no new sypmtoms.  No further cardiovascular testing is indicated.  We will continue with aggressive risk reduction and meds as listed.  HTN: The blood pressure is at target.  No change in therapy.   SLEEP APNEA:   She has not wanted treatment.   CAROTID STENOSIS:     She is followed by [facility]  DYSLIPIDEMIA:   I will have this drawn when she comes back for her echo in August.   MURMUR: She had mild AS on echoin 2018.  This was moderate last year in August and had gotten worse.  I will follow this with an echo this summer.  She may well eventually need to be considered for TAVR  COVID EDUCATION: She has been vaccinated.    Current medicines are reviewed at length with the patient today.  The patient does not have concerns regarding medicines.  The following changes have been made:  None  Labs/ tests ordered today include:   Orders Placed This Encounter  Procedures  . Lipid panel  . EKG 12-Lead  . ECHOCARDIOGRAM COMPLETE     Disposition:   FU with me one year based on the results of the echo   Signed, Minus Breeding, MD  06/08/2020 3:25 PM    [place]

## 2020-06-08 ENCOUNTER — Encounter: Payer: Self-pay | Admitting: Cardiology

## 2020-06-08 ENCOUNTER — Other Ambulatory Visit: Payer: Self-pay

## 2020-06-08 ENCOUNTER — Ambulatory Visit (INDEPENDENT_AMBULATORY_CARE_PROVIDER_SITE_OTHER): Payer: Medicare Other | Admitting: Cardiology

## 2020-06-08 VITALS — BP 122/72 | HR 61 | Ht 67.0 in | Wt 231.2 lb

## 2020-06-08 DIAGNOSIS — I2510 Atherosclerotic heart disease of native coronary artery without angina pectoris: Secondary | ICD-10-CM | POA: Diagnosis not present

## 2020-06-08 DIAGNOSIS — I10 Essential (primary) hypertension: Secondary | ICD-10-CM

## 2020-06-08 DIAGNOSIS — I350 Nonrheumatic aortic (valve) stenosis: Secondary | ICD-10-CM

## 2020-06-08 DIAGNOSIS — G4730 Sleep apnea, unspecified: Secondary | ICD-10-CM | POA: Diagnosis not present

## 2020-06-08 DIAGNOSIS — E785 Hyperlipidemia, unspecified: Secondary | ICD-10-CM

## 2020-06-08 DIAGNOSIS — I6529 Occlusion and stenosis of unspecified carotid artery: Secondary | ICD-10-CM

## 2020-06-08 NOTE — Patient Instructions (Signed)
Medication Instructions:  The current medical regimen is effective;  continue present plan and medications.  *If you need a refill on your cardiac medications before your next appointment, please call your pharmacy*   Lab Work: LIPID panel  If you have labs (blood work) drawn today and your tests are completely normal, you will receive your results only by: Marland Kitchen MyChart Message (if you have MyChart) OR . A paper copy in the mail If you have any lab test that is abnormal or we need to change your treatment, we will call you to review the results.   Testing/Procedures: Echocardiogram - Your physician has requested that you have an echocardiogram. Echocardiography is a painless test that uses sound waves to create images of your heart. It provides your doctor with information about the size and shape of your heart and how well your heart's chambers and valves are working. This procedure takes approximately one hour. There are no restrictions for this procedure. This will be performed at our [facility] location - [address], [address].    Follow-Up: At [facility], you and your health needs are our priority.  As part of our continuing mission to provide you with exceptional heart care, we have created designated Provider Care Teams.  These Care Teams include your primary Cardiologist (physician) and Advanced Practice Providers (APPs -  Physician Assistants and Nurse Practitioners) who all work together to provide you with the care you need, when you need it.  We recommend signing up for the patient portal called "MyChart".  Sign up information is provided on this After Visit Summary.  MyChart is used to connect with patients for Virtual Visits (Telemedicine).  Patients are able to view lab/test results, encounter notes, upcoming appointments, etc.  Non-urgent messages can be sent to your provider as well.   To learn more about what you can do with MyChart, go to [email or web address].     Your next appointment:   12 month(s)  The format for your next appointment:   In Person  Provider:   Minus Breeding, MD

## 2020-08-27 ENCOUNTER — Emergency Department (HOSPITAL_COMMUNITY): Payer: Medicare Other

## 2020-08-27 ENCOUNTER — Emergency Department (HOSPITAL_COMMUNITY)
Admission: EM | Admit: 2020-08-27 | Discharge: 2020-08-27 | Disposition: A | Discharge status: Home / Self Care | Payer: Medicare Other | Attending: Emergency Medicine | Admitting: Emergency Medicine

## 2020-08-27 ENCOUNTER — Other Ambulatory Visit: Payer: Self-pay

## 2020-08-27 ENCOUNTER — Encounter (HOSPITAL_COMMUNITY): Payer: Self-pay

## 2020-08-27 DIAGNOSIS — I10 Essential (primary) hypertension: Secondary | ICD-10-CM | POA: Insufficient documentation

## 2020-08-27 DIAGNOSIS — Z8673 Personal history of transient ischemic attack (TIA), and cerebral infarction without residual deficits: Secondary | ICD-10-CM | POA: Diagnosis not present

## 2020-08-27 DIAGNOSIS — M25551 Pain in right hip: Secondary | ICD-10-CM | POA: Diagnosis not present

## 2020-08-27 DIAGNOSIS — Z7982 Long term (current) use of aspirin: Secondary | ICD-10-CM | POA: Insufficient documentation

## 2020-08-27 DIAGNOSIS — S0101XA Laceration without foreign body of scalp, initial encounter: Secondary | ICD-10-CM | POA: Insufficient documentation

## 2020-08-27 DIAGNOSIS — Y92480 Sidewalk as the place of occurrence of the external cause: Secondary | ICD-10-CM | POA: Diagnosis not present

## 2020-08-27 DIAGNOSIS — M25552 Pain in left hip: Secondary | ICD-10-CM | POA: Insufficient documentation

## 2020-08-27 DIAGNOSIS — I4891 Unspecified atrial fibrillation: Secondary | ICD-10-CM | POA: Insufficient documentation

## 2020-08-27 DIAGNOSIS — Z951 Presence of aortocoronary bypass graft: Secondary | ICD-10-CM | POA: Diagnosis not present

## 2020-08-27 DIAGNOSIS — Z23 Encounter for immunization: Secondary | ICD-10-CM | POA: Insufficient documentation

## 2020-08-27 DIAGNOSIS — J45909 Unspecified asthma, uncomplicated: Secondary | ICD-10-CM | POA: Diagnosis not present

## 2020-08-27 DIAGNOSIS — Z79899 Other long term (current) drug therapy: Secondary | ICD-10-CM | POA: Diagnosis not present

## 2020-08-27 DIAGNOSIS — S0990XA Unspecified injury of head, initial encounter: Secondary | ICD-10-CM | POA: Diagnosis not present

## 2020-08-27 DIAGNOSIS — M25511 Pain in right shoulder: Secondary | ICD-10-CM | POA: Diagnosis not present

## 2020-08-27 DIAGNOSIS — W19XXXA Unspecified fall, initial encounter: Secondary | ICD-10-CM | POA: Diagnosis not present

## 2020-08-27 DIAGNOSIS — S199XXA Unspecified injury of neck, initial encounter: Secondary | ICD-10-CM | POA: Diagnosis not present

## 2020-08-27 DIAGNOSIS — I2510 Atherosclerotic heart disease of native coronary artery without angina pectoris: Secondary | ICD-10-CM | POA: Diagnosis not present

## 2020-08-27 DIAGNOSIS — J3489 Other specified disorders of nose and nasal sinuses: Secondary | ICD-10-CM | POA: Diagnosis not present

## 2020-08-27 DIAGNOSIS — S0003XA Contusion of scalp, initial encounter: Secondary | ICD-10-CM

## 2020-08-27 DIAGNOSIS — R58 Hemorrhage, not elsewhere classified: Secondary | ICD-10-CM | POA: Diagnosis not present

## 2020-08-27 HISTORY — DX: Unspecified osteoarthritis, unspecified site: M19.90

## 2020-08-27 HISTORY — DX: Essential (primary) hypertension: I10

## 2020-08-27 HISTORY — DX: Unspecified asthma, uncomplicated: J45.909

## 2020-08-27 MED ORDER — TETANUS-DIPHTH-ACELL PERTUSSIS 5-2.5-18.5 LF-MCG/0.5 IM SUSY
0.5000 mL | PREFILLED_SYRINGE | Freq: Once | INTRAMUSCULAR | Status: AC
  Administered 2020-08-27: 0.5 mL via INTRAMUSCULAR
  Filled 2020-08-27: qty 0.5

## 2020-08-27 MED ORDER — ACETAMINOPHEN 500 MG PO TABS
1000.0000 mg | ORAL_TABLET | Freq: Once | ORAL | Status: AC
  Administered 2020-08-27: 1000 mg via ORAL
  Filled 2020-08-27: qty 2

## 2020-08-27 MED ORDER — ALBUTEROL SULFATE HFA 108 (90 BASE) MCG/ACT IN AERS
4.0000 | INHALATION_SPRAY | Freq: Once | RESPIRATORY_TRACT | Status: AC
  Administered 2020-08-27: 4 via RESPIRATORY_TRACT
  Filled 2020-08-27: qty 6.7

## 2020-08-27 MED ORDER — LIDOCAINE-EPINEPHRINE (PF) 2 %-1:200000 IJ SOLN
10.0000 mL | Freq: Once | INTRAMUSCULAR | Status: AC
  Administered 2020-08-27: 10 mL via INTRADERMAL
  Filled 2020-08-27: qty 20

## 2020-08-27 NOTE — ED Notes (Signed)
Dr Tyrone Nine notified of pt being in afib, no hx of afib per pt

## 2020-08-27 NOTE — ED Triage Notes (Signed)
See previous note, denies any hip or leg pain, no c/o pain with palpation

## 2020-08-27 NOTE — ED Notes (Signed)
ED Provider at bedside, dr Tyrone Nine

## 2020-08-27 NOTE — ED Provider Notes (Signed)
Signed out by Dr Tyrone Nine to d/c to home when cts back/negative.   CTs negative for acute trauma.  Recheck pt, c spine non tender. abd soft non tender. Pt denies cp or sob. No nv. Feels she is breathing at baseline, room air sats currently 94%. Chest cta.  Of note on monitor, pt in afib. Denies hx same. Is on asa q day.  Continues to deny any chest pain, sob, no weakness/faintness.   Cardiology consulted. Discussed pt, new onset afib, unknown duration, trauma/head contusion/lac today.  He indicates would recommend office follow up to recheck, and decide on possible anticoag therapy then - discussed  w pt.   Return precautions provided.      Lori Saver, MD 08/27/20 [name]

## 2020-08-27 NOTE — Discharge Instructions (Addendum)
It was our pleasure to provide your ER care today - we hope that you feel better.  Follow up with primary care doctor in 7-10 days for staple removal.  While you were in the ER today, we noticed that your heart rhythm is in atrial fibrillation.  A-fib is an irregular heart rhythm that increases a persons risk of stroke.  We discussed your case with our cardiologist in call - he recommends that you follow up with your doctor/cardiologist in the coming week to discuss the pros/cons of starting blood thinner medication.  For now, continue an aspirin a day.   Return to ER if worse, new symptoms, new or severe pain, infection of wound, chest pain, trouble breathing, fainting, or other concern.
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
## 2020-08-28 ENCOUNTER — Telehealth (HOSPITAL_COMMUNITY): Payer: Self-pay

## 2020-08-28 ENCOUNTER — Telehealth: Payer: Self-pay | Admitting: Cardiology

## 2020-08-28 ENCOUNTER — Encounter: Payer: Self-pay | Admitting: Cardiology

## 2020-08-28 DIAGNOSIS — T148XXA Other injury of unspecified body region, initial encounter: Secondary | ICD-10-CM | POA: Diagnosis not present

## 2020-08-28 DIAGNOSIS — S0101XA Laceration without foreign body of scalp, initial encounter: Secondary | ICD-10-CM | POA: Diagnosis not present

## 2020-08-28 DIAGNOSIS — R58 Hemorrhage, not elsewhere classified: Secondary | ICD-10-CM | POA: Diagnosis not present
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
## 2020-08-28 NOTE — Telephone Encounter (Signed)
Reached out to patient to schedule ED f/u. Patient states that she wants to talk with Dr. Percival Spanish first. Advise patient to call back.

## 2020-08-31 DIAGNOSIS — Z789 Other specified health status: Secondary | ICD-10-CM | POA: Diagnosis not present

## 2020-08-31 DIAGNOSIS — I10 Essential (primary) hypertension: Secondary | ICD-10-CM | POA: Diagnosis not present

## 2020-08-31 DIAGNOSIS — I2510 Atherosclerotic heart disease of native coronary artery without angina pectoris: Secondary | ICD-10-CM | POA: Diagnosis not present

## 2020-08-31 DIAGNOSIS — Z299 Encounter for prophylactic measures, unspecified: Secondary | ICD-10-CM | POA: Diagnosis not present

## 2020-09-06 DIAGNOSIS — S0101XD Laceration without foreign body of scalp, subsequent encounter: Secondary | ICD-10-CM | POA: Diagnosis not present

## 2020-09-06 DIAGNOSIS — R58 Hemorrhage, not elsewhere classified: Secondary | ICD-10-CM | POA: Diagnosis not present

## 2020-09-06 DIAGNOSIS — Z4802 Encounter for removal of sutures: Secondary | ICD-10-CM | POA: Diagnosis not present

## 2020-09-06 DIAGNOSIS — T148XXA Other injury of unspecified body region, initial encounter: Secondary | ICD-10-CM | POA: Diagnosis not present

## 2020-09-06 DIAGNOSIS — I480 Paroxysmal atrial fibrillation: Secondary | ICD-10-CM | POA: Diagnosis not present

## 2020-09-14 ENCOUNTER — Telehealth: Payer: Self-pay | Admitting: Cardiology

## 2020-09-14 NOTE — Telephone Encounter (Signed)
Attempted to call patient back twice. This RN could hear patient but she could not hear me. Attempted to call husband's number listed in DPR but this "number is not in service"  MyChart message sent asking for follow up

## 2020-09-14 NOTE — Telephone Encounter (Signed)
Pt c/o Shortness Of Breath: STAT if SOB developed within the last 24 hours or pt is noticeably SOB on the phone  1. Are you currently SOB (can you hear that pt is SOB on the phone)? no  2. How long have you been experiencing SOB? 6 weeks getting worse  3. Are you SOB when sitting or when up moving around? Moving around  4. Are you currently experiencing any other symptoms? Pain in back upper back and neck on exertion, not currently  Patient states she gets SOB on exertion and states she know she is not getting enough oxygen. She is not currently having symptoms. She states it has been happening for 6 weeks, but is happening more often now. She states she also gets a pain in her upper back and neck on exertion and that when she had her heart attack she had back pain not chest pain.

## 2020-09-22 NOTE — Telephone Encounter (Signed)
Spoke with pt re message and pt has noted SOB for several weeks "every once in awhile" but after fall 3 weeks ago had worsened Per pt was evaluated at hospital for broke bones and was told at that time was in afib 08/27/20 Per pt SOB is better at this time has been taking Furosemide as directed.Also encouraged pt to watch salt intake. Pt has appt with Dr Percival Spanish next Friday Will forward to Dr Percival Spanish for review and recommendations./cy

## 2020-09-23 NOTE — Telephone Encounter (Signed)
OK to discuss at her appt.

## 2020-09-25 NOTE — Telephone Encounter (Signed)
Spoke with pt, aware of dr hochrein's recommendations. ?

## 2020-09-28 ENCOUNTER — Encounter: Payer: Self-pay | Admitting: Cardiology
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
## 2020-09-29 ENCOUNTER — Ambulatory Visit (INDEPENDENT_AMBULATORY_CARE_PROVIDER_SITE_OTHER): Payer: Medicare Other | Admitting: Cardiology

## 2020-09-29 ENCOUNTER — Encounter: Payer: Self-pay | Admitting: Cardiology

## 2020-09-29 ENCOUNTER — Other Ambulatory Visit: Payer: Self-pay

## 2020-09-29 VITALS — BP 132/68 | HR 67 | Ht 67.0 in | Wt 232.6 lb

## 2020-09-29 DIAGNOSIS — I2510 Atherosclerotic heart disease of native coronary artery without angina pectoris: Secondary | ICD-10-CM | POA: Diagnosis not present

## 2020-09-29 DIAGNOSIS — E785 Hyperlipidemia, unspecified: Secondary | ICD-10-CM | POA: Diagnosis not present

## 2020-09-29 DIAGNOSIS — R0602 Shortness of breath: Secondary | ICD-10-CM | POA: Diagnosis not present

## 2020-09-29 DIAGNOSIS — I10 Essential (primary) hypertension: Secondary | ICD-10-CM

## 2020-09-29 DIAGNOSIS — I6529 Occlusion and stenosis of unspecified carotid artery: Secondary | ICD-10-CM

## 2020-09-29 DIAGNOSIS — I350 Nonrheumatic aortic (valve) stenosis: Secondary | ICD-10-CM | POA: Diagnosis not present

## 2020-09-29 DIAGNOSIS — I4891 Unspecified atrial fibrillation: Secondary | ICD-10-CM

## 2020-09-29 NOTE — Patient Instructions (Signed)
Medication Instructions:   STOP ASPIRIN  *If you need a refill on your cardiac medications before your next appointment, please call your pharmacy*   Lab Work:  Your physician recommends that you HAVE LAB WORK TODAY  If you have labs (blood work) drawn today and your tests are completely normal, you will receive your results only by: Marland Kitchen MyChart Message (if you have MyChart) OR . A paper copy in the mail If you have any lab test that is abnormal or we need to change your treatment, we will call you to review the results.   Testing/Procedures:  Your physician has requested that you have an echocardiogram. Echocardiography is a painless test that uses sound waves to create images of your heart. It provides your doctor with information about the size and shape of your heart and how well your heart's chambers and valves are working. This procedure takes approximately one hour. There are no restrictions for this procedure.[place]     Follow-Up: At [facility], you and your health needs are our priority.  As part of our continuing mission to provide you with exceptional heart care, we have created designated Provider Care Teams.  These Care Teams include your primary Cardiologist (physician) and Advanced Practice Providers (APPs -  Physician Assistants and Nurse Practitioners) who all work together to provide you with the care you need, when you need it.  We recommend signing up for the patient portal called "MyChart".  Sign up information is provided on this After Visit Summary.  MyChart is used to connect with patients for Virtual Visits (Telemedicine).  Patients are able to view lab/test results, encounter notes, upcoming appointments, etc.  Non-urgent messages can be sent to your provider as well.   To learn more about what you can do with MyChart, go to [email or web address].    Your next appointment:   2 month(s)  The format for your next appointment:   In  Person  Provider:   Minus Breeding, MD

## 2020-09-30 LAB — CBC
Hematocrit: 45.1 % (ref 34.0–46.6)
Hemoglobin: 15 g/dL (ref 11.1–15.9)
MCH: 29.8 pg (ref 26.6–33.0)
MCHC: 33.3 g/dL (ref 31.5–35.7)
MCV: 90 fL (ref 79–97)
Platelets: 226 10*3/uL (ref 150–450)
RBC: 5.04 x10E6/uL (ref 3.77–5.28)
RDW: 13.6 % (ref 11.7–15.4)
WBC: 7.9 10*3/uL (ref 3.4–10.8)

## 2020-09-30 LAB — BASIC METABOLIC PANEL
BUN/Creatinine Ratio: 27 (ref 12–28)
BUN: 28 mg/dL — ABNORMAL HIGH (ref 8–27)
CO2: 27 mmol/L (ref 20–29)
Calcium: 9.8 mg/dL (ref 8.7–10.3)
Chloride: 102 mmol/L (ref 96–106)
Creatinine, Ser: 1.05 mg/dL — ABNORMAL HIGH (ref 0.57–1.00)
Glucose: 138 mg/dL — ABNORMAL HIGH (ref 65–99)
Potassium: 3.9 mmol/L (ref 3.5–5.2)
Sodium: 145 mmol/L — ABNORMAL HIGH (ref 134–144)
eGFR: 52 mL/min/{1.73_m2} — ABNORMAL LOW (ref >59)

## 2020-09-30 LAB — PRO B NATRIURETIC PEPTIDE: NT-Pro BNP: 1888 pg/mL — ABNORMAL HIGH (ref 0–738)

## 2020-10-03 ENCOUNTER — Telehealth: Payer: Self-pay | Admitting: Cardiology

## 2020-10-03 DIAGNOSIS — I10 Essential (primary) hypertension: Secondary | ICD-10-CM

## 2020-10-03 DIAGNOSIS — Z5181 Encounter for therapeutic drug level monitoring: Secondary | ICD-10-CM

## 2020-10-03 NOTE — Telephone Encounter (Signed)
Patient returning call for lab results. She states to call before 4:30 pm, because that's the time they go to dinner at her retirement home.

## 2020-10-03 NOTE — Telephone Encounter (Signed)
Spoke with patient and she is currently taking Lasix 20 mg 1/2 tablet most days, unless she has appointment somewhere  Advised to increase to full tablet  She did give me PCP name in [place] but has moved to [place] and would like recommendation of PCP from Dr Percival Spanish   Will forward to Dr Percival Spanish for review  ? Recheck labs

## 2020-10-03 NOTE — Telephone Encounter (Signed)
-----   Message from Minus Breeding, MD sent at 09/30/2020  8:01 AM EDT ----- BNP is increased.  I would suggest that she take the Lasix 20 mg every day if she is not already doing that.  Call Lori Price with the results .  Also, please find out who her PCP is.

## 2020-10-04 DIAGNOSIS — Z0000 Encounter for general adult medical examination without abnormal findings: Secondary | ICD-10-CM | POA: Diagnosis not present

## 2020-10-04 DIAGNOSIS — I4891 Unspecified atrial fibrillation: Secondary | ICD-10-CM | POA: Diagnosis not present

## 2020-10-04 DIAGNOSIS — Z6830 Body mass index (BMI) 30.0-30.9, adult: Secondary | ICD-10-CM | POA: Diagnosis not present

## 2020-10-04 DIAGNOSIS — E7800 Pure hypercholesterolemia, unspecified: Secondary | ICD-10-CM | POA: Diagnosis not present

## 2020-10-04 DIAGNOSIS — I779 Disorder of arteries and arterioles, unspecified: Secondary | ICD-10-CM | POA: Diagnosis not present

## 2020-10-04 DIAGNOSIS — Z1331 Encounter for screening for depression: Secondary | ICD-10-CM | POA: Diagnosis not present

## 2020-10-04 DIAGNOSIS — I10 Essential (primary) hypertension: Secondary | ICD-10-CM | POA: Diagnosis not present

## 2020-10-04 DIAGNOSIS — Z7189 Other specified counseling: Secondary | ICD-10-CM | POA: Diagnosis not present

## 2020-10-04 DIAGNOSIS — Z299 Encounter for prophylactic measures, unspecified: Secondary | ICD-10-CM | POA: Diagnosis not present

## 2020-10-04 DIAGNOSIS — Z1339 Encounter for screening examination for other mental health and behavioral disorders: Secondary | ICD-10-CM | POA: Diagnosis not present

## 2020-10-04 DIAGNOSIS — Z79899 Other long term (current) drug therapy: Secondary | ICD-10-CM | POA: Diagnosis not present

## 2020-10-04 DIAGNOSIS — R5383 Other fatigue: Secondary | ICD-10-CM | POA: Diagnosis not present

## 2020-10-04 NOTE — Telephone Encounter (Signed)
OK to repeat BMET in one week.  Also she is supposed to let me know when her scalp stops bleeding.

## 2020-10-04 NOTE — Telephone Encounter (Signed)
PCP recommendation in [place]?   Saw PCP today and he did not see any bleeding, hard for her to tell secondary to location   Will forward to Dr Percival Spanish for review

## 2020-10-05 DIAGNOSIS — Z23 Encounter for immunization: Secondary | ICD-10-CM | POA: Diagnosis not present

## 2020-10-05 NOTE — Telephone Encounter (Signed)
She can start the Eliquis per my office note.   I don't have a PCP recommendation.   I might suggest looking at the [facility] group.  I just don't know who is taking new patients.

## 2020-10-06 NOTE — Telephone Encounter (Signed)
Left message to call back  

## 2020-10-17 DIAGNOSIS — L304 Erythema intertrigo: Secondary | ICD-10-CM | POA: Diagnosis not present

## 2020-10-17 DIAGNOSIS — Z85828 Personal history of other malignant neoplasm of skin: Secondary | ICD-10-CM | POA: Diagnosis not present

## 2020-10-17 DIAGNOSIS — L239 Allergic contact dermatitis, unspecified cause: Secondary | ICD-10-CM | POA: Diagnosis not present

## 2020-10-18 MED ORDER — APIXABAN 5 MG PO TABS: 5.0000 mg | ORAL_TABLET | Freq: Two times a day (BID) | ORAL | 1 refills | Status: DC

## 2020-10-18 NOTE — Telephone Encounter (Signed)
Lasix could cause a rash.  I don't know that increasing the dose would do that.  If her rash continues she might have to give herself a few days off of it.

## 2020-10-18 NOTE — Telephone Encounter (Signed)
Left message to call back  

## 2020-10-18 NOTE — Telephone Encounter (Signed)
Spoke with patient and reviewed recommendations  Since speaking with patient she has developed dermatitis and was seen by Dermatologist yesterday   Cortisone cream Rx'd  1) Itching and burning started about 5 days after increasing Lasix to full tablet. Could this be causing?   2) Wants to confirm Eliquis 5 mg twice a day correct dose   Will forward to Pharm D for review

## 2020-10-18 NOTE — Telephone Encounter (Signed)
Yes, patient is on correct Eliquis dose of 5mg  BID

## 2020-10-18 NOTE — Telephone Encounter (Signed)
Rx sent to pharmacy  Did confirm with patient no bleeding  Discussed use of Ibuprofen, patient stated she uses Tylenol now   Will route Lasix reaction to Dr Percival Spanish for review

## 2020-10-23 NOTE — Telephone Encounter (Signed)
Left message to call back  

## 2020-11-07 DIAGNOSIS — H43812 Vitreous degeneration, left eye: Secondary | ICD-10-CM | POA: Diagnosis not present

## 2020-11-07 DIAGNOSIS — H0102B Squamous blepharitis left eye, upper and lower eyelids: Secondary | ICD-10-CM | POA: Diagnosis not present

## 2020-11-07 DIAGNOSIS — H40013 Open angle with borderline findings, low risk, bilateral: Secondary | ICD-10-CM | POA: Diagnosis not present

## 2020-11-07 DIAGNOSIS — H04123 Dry eye syndrome of bilateral lacrimal glands: Secondary | ICD-10-CM | POA: Diagnosis not present

## 2020-11-07 DIAGNOSIS — Z961 Presence of intraocular lens: Secondary | ICD-10-CM | POA: Diagnosis not present

## 2020-11-07 DIAGNOSIS — H0102A Squamous blepharitis right eye, upper and lower eyelids: Secondary | ICD-10-CM | POA: Diagnosis not present

## 2020-11-09 ENCOUNTER — Other Ambulatory Visit: Payer: Self-pay

## 2020-11-09 ENCOUNTER — Ambulatory Visit (HOSPITAL_COMMUNITY): Payer: Medicare Other | Attending: Cardiology

## 2020-11-09 DIAGNOSIS — I350 Nonrheumatic aortic (valve) stenosis: Secondary | ICD-10-CM | POA: Diagnosis not present

## 2020-11-09 DIAGNOSIS — I2510 Atherosclerotic heart disease of native coronary artery without angina pectoris: Secondary | ICD-10-CM | POA: Diagnosis not present

## 2020-11-09 MED ORDER — PERFLUTREN LIPID MICROSPHERE
1.0000 mL | INTRAVENOUS | Status: AC | PRN
  Administered 2020-11-09: 1 mL via INTRAVENOUS

## 2020-11-10 LAB — ECHOCARDIOGRAM COMPLETE
AR max vel: 0.45 cm2
AV Area VTI: 0.48 cm2
AV Area mean vel: 0.49 cm2
AV Mean grad: 60 mmHg
AV Peak grad: 109 mmHg
Ao pk vel: 5.22 m/s
Area-P 1/2: 2.11 cm2
MV VTI: 1.61 cm2
S' Lateral: 2.4 cm

## 2020-11-15 NOTE — Progress Notes (Signed)
Cardiology Office Note   Date:  11/17/2020   ID:  Lori, Price [birth year], MRN [id number]  PCP:  Lori Chroman, MD  Cardiologist:   Minus Breeding, MD   Chief Complaint  Patient presents with   Atrial Fibrillation   Aortic Stenosis       History of Present Illness: Lori Price is a 87 y.o. female who presents for follow up of CAD/CABG.    She was in the hospital in July of 2017 with a TIA.  She had a hypertensive urgency  As part of this evaluation she did have an echo which was unchanged from previous.  She had a stress perfusion study in July 2018 with a small inferior defect possibly a prior MI with mild ischemia.  The EF was 60%.  A BNP level was mildly elevated and I did start her on Lasix.  A sleep study had severe oxygen desaturations and severe periodic limb movements. However, there were some scheduling issues and she never had her CPAP.  I tried to convince her to pursue treatment of this. However, she has not wanted to pursue this.    At the last visit I sent her for an echo which showed critical AS.  She also was newly found to be in atrial fibrillation.  After that visit we started Eliquis.  Unfortunately she cut herself on her arm and she just did not want to take this and stopped taking it about 3 days ago.  She denies any chest pressure, neck or arm discomfort.  She denies any new shortness of breath, PND or orthopnea.  She gets around slowly with her walker.   Past Medical History:  Diagnosis Date   Arthritis    Asthma    BPV (benign positional vertigo)    Carotid artery occlusion    Coronary artery disease    s/p MI 2001--(catheterizations in 2001 with left main 70% stenosis, LAD 70% followed by 90% stenosis, circumflex 99% stenosis, right coronary artery occluded.    Hyperlipidemia    Hypertension    Nephrolithiasis    Peripheral vascular disease ([place])    Sleep apnea    CPAP   no   Vertigo     Past Surgical History:  Procedure Laterality Date    CARDIAC CATHETERIZATION     2001   CARDIAC SURGERY     CAROTID ENDARTERECTOMY Right    cea  Dr. Amedeo Plenty   CARPAL TUNNEL RELEASE Right    CORONARY ARTERY BYPASS GRAFT     2001 (LIMA to LAD, SVG to obtuse marginal, sequential  SVG to RV , marginal branch in the distal right coronary artery.   knee arthoscopy     left carpal tunnel     ORIF SHOULDER FRACTURE Right 08/30/2013   Procedure: HEMI ARTHROPLASTY;  Surgeon: [name], MD;  Location: [facility];  Service: Orthopedics;  Laterality: Right;   right carotid endarterectomy     TONSILLECTOMY       Current Outpatient Medications  Medication Sig Dispense Refill   acetaminophen (TYLENOL) 500 MG tablet Take 1,000 mg by mouth every 6 (six) hours as needed.     atenolol-chlorthalidone (TENORETIC) 100-25 MG tablet Take 1 tablet by mouth daily. 90 tablet 3   atorvastatin (LIPITOR) 10 MG tablet Take 1 tablet (10 mg total) by mouth daily. 90 tablet 3   bisacodyl (DULCOLAX) 5 MG EC tablet Take 5 mg by mouth daily as needed for moderate constipation.  BREO ELLIPTA 100-25 MCG/INH AEPB as needed.     fexofenadine (ALLEGRA) 180 MG tablet Take 180 mg by mouth daily as needed for allergies.     folic acid (FOLVITE) 481 MCG tablet 400 mcg. Take one tablet by mouth every third day     furosemide (LASIX) 20 MG tablet TAKE (1/2) TO (1) TABLET DAILY AS NEEDED. 45 tablet 10   Mag Aspart-Potassium Aspart (ASPARTATE MG & K) 90-90 MG CAPS Take 1 capsule by mouth once a week. Take one tablet as needed     meclizine (ANTIVERT) 25 MG tablet Take 25 mg by mouth daily as needed for dizziness.     Multiple Vitamin (MULTIVITAMIN) capsule Take 1 capsule by mouth every other day.     Omega-3 Fatty Acids (FISH OIL) 1000 MG CAPS Take 1,000 mg by mouth daily.     apixaban (ELIQUIS) 5 MG TABS tablet Take 1 tablet (5 mg total) by mouth 2 (two) times daily. (Patient not taking: Reported on 11/17/2020) 180 tablet 1   No current facility-administered medications for this visit.     Allergies:   Codeine, Losartan, Oxycodone, Penicillins, Pentazocine, Quinapril, Sibutramine, Sulfamethoxazole, Sulfonamide derivatives, Ace inhibitors, Ak-mycin [erythromycin], Drug ingredient [modified tree tyrosine adsorbate], Eggs or egg-derived products, Losartan potassium, Other, and Penicillins    ROS:  Please see the history of present illness.   Otherwise, review of systems are positive for none.   All other systems are reviewed and negative.    PHYSICAL EXAM: VS:  BP 120/69 (BP Location: Left Arm, Patient Position: Sitting, Cuff Size: Large)   Pulse 78   Ht 5\' 8"  (1.727 m)   Wt 230 lb 12.8 oz (104.7 kg)   SpO2 94%   BMI 35.09 kg/m  , BMI Body mass index is 35.09 kg/m. GENERAL:  Well appearing NECK:  No jugular venous distention, waveform within normal limits, carotid upstroke brisk and symmetric, no bruits, no thyromegaly LUNGS:  Clear to auscultation bilaterally CHEST:  Unremarkable HEART:  PMI not displaced or sustained,S1 and S2 within normal limits, no S3, no clicks, no rubs, 3 out of 6 apical systolic murmur radiating up the aortic outflow tract, late peaking, no diastolic murmurs, irregular  ABD:  Flat, positive bowel sounds normal in frequency in pitch, no bruits, no rebound, no guarding, no midline pulsatile mass, no hepatomegaly, no splenomegaly EXT:  2 plus pulses throughout, no edema, no cyanosis no clubbing  EKG:  EKG is not ordered today.    Recent Labs: 09/29/2020: BUN 28; Creatinine, Ser 1.05; Hemoglobin 15.0; NT-Pro BNP 1,888; Platelets 226; Potassium 3.9; Sodium 145    Lipid Panel    Component Value Date/Time   CHOL 153 02/08/2016 0852   TRIG 213 (H) 02/08/2016 0852   HDL 39 (L) 02/08/2016 0852   CHOLHDL 3.9 02/08/2016 0852   CHOLHDL 7.8 12/14/2015 0542   VLDL UNABLE TO CALCULATE IF TRIGLYCERIDE OVER 400 mg/dL 12/14/2015 0542   LDLCALC 71 02/08/2016 0852   LDLDIRECT 111.8 02/03/2014 0924      Wt Readings from Last 3 Encounters:  11/17/20  230 lb 12.8 oz (104.7 kg)  09/29/20 232 lb 9.6 oz (105.5 kg)  08/27/20 228 lb (103.4 kg)      Other studies Reviewed: Additional studies/ records that were reviewed today include: Echo. Review of the above records demonstrates:  Please see elsewhere in the note.     ASSESSMENT AND PLAN:  ATRIAL FIB: The patient's atrial fibrillation is new.  She needs to be on anticoagulation  as her Ms. KALIOPI BLYDEN has a CHA2DS2 - VASc score of 5 puts her at higher risk.     We had a long conversation about this.  We talked about the risks benefits of anticoagulation.  She seems to have good control of her rate.  After today's conversation I think she is convinced to restart her anticoagulation.  Otherwise no change in therapy.    CAD:  The patient has no new sypmtoms.  No further cardiovascular testing is indicated.  We will continue with aggressive risk reduction and meds as listed.  HTN:  The blood pressure is is well controlled.  No change in therapy.   SLEEP APNEA:    She has  absolutely not wanted therapy for this.   CAROTID STENOSIS: This is followed by [facility].  DYSLIPIDEMIA: I like her to come back in about 1 month to get a lipid profile and a CBC.   AS:  This is critical.  I had a long conversation with the patient and her husband and surprisingly she would agree to consideration of TAVR.  She has had progressive gradient from 37-60 over a few months.   I am going to send her to see the Structural Heart team to begin evaluation.  We talked about the symptoms of shortness of breath and need to avoid salt.  Current medicines are reviewed at length with the patient today.  The patient does not have concerns regarding medicines.  The following changes have been made: None  Labs/ tests ordered today include:   None  Orders Placed This Encounter  Procedures   Ambulatory referral to [facility] (only at [place])    Disposition:   Follow-up with me after the above evaluation.      Signed, Minus Breeding, MD  11/17/2020 1:50 PM    [facility]

## 2020-11-17 ENCOUNTER — Ambulatory Visit (INDEPENDENT_AMBULATORY_CARE_PROVIDER_SITE_OTHER): Payer: Medicare Other | Admitting: Cardiology

## 2020-11-17 ENCOUNTER — Encounter: Payer: Self-pay | Admitting: Cardiology

## 2020-11-17 ENCOUNTER — Other Ambulatory Visit: Payer: Self-pay

## 2020-11-17 ENCOUNTER — Telehealth: Payer: Self-pay | Admitting: *Deleted

## 2020-11-17 VITALS — BP 120/69 | HR 78 | Ht 68.0 in | Wt 230.8 lb

## 2020-11-17 DIAGNOSIS — I6529 Occlusion and stenosis of unspecified carotid artery: Secondary | ICD-10-CM | POA: Diagnosis not present

## 2020-11-17 DIAGNOSIS — E785 Hyperlipidemia, unspecified: Secondary | ICD-10-CM

## 2020-11-17 DIAGNOSIS — I350 Nonrheumatic aortic (valve) stenosis: Secondary | ICD-10-CM | POA: Diagnosis not present

## 2020-11-17 NOTE — Telephone Encounter (Signed)
Appointment is to be July 6 at 12:40.  I spoke with patient and she can be here for this appointment.  Her husband will accompany her.

## 2020-11-17 NOTE — Telephone Encounter (Signed)
I placed call to patient to schedule appointment with Dr Burt Knack for TAVR consult on July 7 at 12:40.  Left message to call office

## 2020-11-17 NOTE — Patient Instructions (Addendum)
Medication Instructions:  Your physician recommends that you continue on your current medications as directed. Please refer to the Current Medication list given to you today.  *If you need a refill on your cardiac medications before your next appointment, please call your pharmacy*  Lab Work: NONE ordered at this time of appointment   If you have labs (blood work) drawn today and your tests are completely normal, you will receive your results only by: [place] (if you have MyChart) OR A paper copy in the mail If you have any lab test that is abnormal or we need to change your treatment, we will call you to review the results.  Testing/Procedures: NONE ordered at this time of appointment   Follow-Up: At [facility], you and your health needs are our priority.  As part of our continuing mission to provide you with exceptional heart care, we have created designated Provider Care Teams.  These Care Teams include your primary Cardiologist (physician) and Advanced Practice Providers (APPs -  Physician Assistants and Nurse Practitioners) who all work together to provide you with the care you need, when you need it.   Your next appointment:   2-3 month(s)  The format for your next appointment:   In Person  Provider:   Minus Breeding, MD  Other Instructions

## 2020-11-22 ENCOUNTER — Encounter: Payer: Self-pay | Admitting: Cardiovascular Disease

## 2020-11-22 ENCOUNTER — Other Ambulatory Visit: Payer: Self-pay

## 2020-11-22 ENCOUNTER — Ambulatory Visit (INDEPENDENT_AMBULATORY_CARE_PROVIDER_SITE_OTHER): Payer: Medicare Other | Admitting: Cardiovascular Disease

## 2020-11-22 VITALS — BP 110/64 | HR 70 | Ht 67.0 in | Wt 234.8 lb

## 2020-11-22 DIAGNOSIS — I350 Nonrheumatic aortic (valve) stenosis: Secondary | ICD-10-CM

## 2020-11-22 DIAGNOSIS — I6529 Occlusion and stenosis of unspecified carotid artery: Secondary | ICD-10-CM | POA: Diagnosis not present

## 2020-11-22 MED ORDER — FLUCONAZOLE 100 MG PO TABS: 100.0000 mg | ORAL_TABLET | Freq: Every day | ORAL | 0 refills | Status: DC

## 2020-11-22 MED ORDER — NYSTATIN 100000 UNIT/GM EX OINT
1.0000 | TOPICAL_OINTMENT | Freq: Two times a day (BID) | CUTANEOUS | 3 refills | Status: DC
Start: 2020-11-22 — End: 2021-01-04

## 2020-11-22 NOTE — Progress Notes (Signed)
[facility]   MULTIDISCIPLINARY HEART VALVE TEAM  Date:  11/26/2020   ID:  Lori Price, DOB 12-Jul-1931, MRN [id number]  PCP:  Glenda Chroman, MD   Chief Complaint  Patient presents with   Shortness of Breath      HISTORY OF PRESENT ILLNESS: Lori Price is a 87 y.o. female who presents for evaluation of critical aortic stenosis, referred by Dr Percival Spanish.  The patient is here with her husband today. They both come in with the use of a walker. They live in a single family home in the [place] [facility] on [address]. They get around the campus with a golf cart. The patient uses a walker because of severe gait instability. She had a mechanical fall 3-4 years ago with a crush injury to the humerus treated surgically with a partial joint replacement and use of titanium rods in the right arm. She has been using a walker since that time and hasn't regained the full use of her right arm since then.   The patient reports a heart murmur since age 89. She has been followed for aortic stenosis by Dr Percival Spanish for over a decade, but only recently has she had marked progression from moderately severe aortic stenosis with a mean gradient of 37 mmHg last year to critical aortic stenosis found on her recent echo with peak and mean gradients of 109 and 60 mmHg, respectively.   The patient initially presented with an acute MI in 2001 and emergent cardiac catheterization showed severe 3 vessel CAD. The patient was taken for emergent CABG by Dr Prescott Gum and was treated with a LIMA-LAD, SVG-OM, and sequential SVG-RV marginal and distal RCA. She hasn't required any further interventions and she only recently began to develop exertional chest pressure and shortness of breath over the past 2 months. Prior to her MI, she had pain radiating to her upper back and neck, and in the past month she has been experiencing this again. She has to walk about 100 yards to the dining room and typically has  symptoms that are worse after she eats. Once she sits and rests, her symptoms resolve over 5-10 minutes. She does not experience symptoms of lightheadedness or syncope, but occasionally has 'vertigo.'   Past Medical History:  Diagnosis Date   Arthritis    Asthma    BPV (benign positional vertigo)    Carotid artery occlusion    Coronary artery disease    s/p MI 2001--(catheterizations in 2001 with left main 70% stenosis, LAD 70% followed by 90% stenosis, circumflex 99% stenosis, right coronary artery occluded.    Hyperlipidemia    Hypertension    Nephrolithiasis    Peripheral vascular disease (HCC)    Sleep apnea    CPAP   no   Vertigo     Current Outpatient Medications  Medication Sig Dispense Refill   acetaminophen (TYLENOL) 500 MG tablet Take 1,000 mg by mouth every 6 (six) hours as needed.     apixaban (ELIQUIS) 5 MG TABS tablet Take 1 tablet (5 mg total) by mouth 2 (two) times daily. 180 tablet 1   atenolol-chlorthalidone (TENORETIC) 100-25 MG tablet Take 1 tablet by mouth daily. 90 tablet 3   atorvastatin (LIPITOR) 10 MG tablet Take 1 tablet (10 mg total) by mouth daily. 90 tablet 3   bisacodyl (DULCOLAX) 5 MG EC tablet Take 5 mg by mouth daily as needed for moderate constipation.     BREO ELLIPTA 100-25 MCG/INH AEPB  as needed.     fexofenadine (ALLEGRA) 180 MG tablet Take 180 mg by mouth daily as needed for allergies.     fluconazole (DIFLUCAN) 100 MG tablet Take 1 tablet (100 mg total) by mouth daily. 10 tablet 0   folic acid (FOLVITE) 010 MCG tablet 400 mcg. Take one tablet by mouth every third day     furosemide (LASIX) 20 MG tablet TAKE (1/2) TO (1) TABLET DAILY AS NEEDED. 45 tablet 10   Mag Aspart-Potassium Aspart (ASPARTATE MG & K) 90-90 MG CAPS Take 1 capsule by mouth once a week. Take one tablet as needed     meclizine (ANTIVERT) 25 MG tablet Take 25 mg by mouth daily as needed for dizziness.     Multiple Vitamin (MULTIVITAMIN) capsule Take 1 capsule by mouth every  other day.     nystatin ointment (MYCOSTATIN) Apply 1 application topically 2 (two) times daily. 30 g 3   Omega-3 Fatty Acids (FISH OIL) 1000 MG CAPS Take 1,000 mg by mouth daily.     No current facility-administered medications for this visit.    ALLERGIES:   Codeine, Losartan, Oxycodone, Penicillins, Pentazocine, Quinapril, Sibutramine, Sulfamethoxazole, Sulfonamide derivatives, Ace inhibitors, Ak-mycin [erythromycin], Drug ingredient [modified tree tyrosine adsorbate], Eggs or egg-derived products, Losartan potassium, Other, and Penicillins   SOCIAL HISTORY:  The patient  reports that she has never smoked. She has never used smokeless tobacco. She reports previous alcohol use. She reports previous drug use.   FAMILY HISTORY:  The patient's family history includes Aneurysm in her father; COPD in her father; Cancer in her mother; Heart disease in her maternal grandfather and maternal grandmother; Kidney disease in her father; Ovarian cancer in her mother.   REVIEW OF SYSTEMS:  Positive for back pain, orthopnea, gait instability.   All other systems are reviewed and negative.   PHYSICAL EXAM: VS:  BP 110/64   Pulse 70   Ht 5\' 7"  (1.702 m)   Wt 234 lb 12.8 oz (106.5 kg)   SpO2 93%   BMI 36.77 kg/m  , BMI Body mass index is 36.77 kg/m. GEN: Elderly, obese woman in no acute distress HEENT: normal Neck: No JVD. carotids 2+ without bruits or masses Cardiac: The heart is RRR with a grade 3/6 harsh late peaking systolic murmur with absent A2.  No edema. Pedal pulses 2+ = bilaterally  Respiratory:  clear to auscultation bilaterally GI: soft, nontender, nondistended, + BS, obese MS: no deformity or atrophy Skin: warm and dry, bilateral groin rash present Neuro:  Strength and sensation are intact Psych: euthymic mood, full affect  EKG:  EKG from today reviewed and demonstrates atrial fibrillation with possible age-indeterminate inferior and anterior MI  RECENT LABS: 09/29/2020: NT-Pro BNP  1,888 11/22/2020: BUN 25; Creatinine, Ser 1.12; Hemoglobin 15.0; Platelets 242; Potassium 3.8; Sodium 140  No results found for requested labs within last 8760 hours.   Estimated Creatinine Clearance: 45.3 mL/min (A) (by C-G formula based on SCr of 1.12 mg/dL (H)).   Wt Readings from Last 3 Encounters:  11/22/20 234 lb 12.8 oz (106.5 kg)  11/17/20 230 lb 12.8 oz (104.7 kg)  09/29/20 232 lb 9.6 oz (105.5 kg)     CARDIAC STUDIES: Echo:  FINDINGS   Left Ventricle: Left ventricular ejection fraction, by estimation, is 65  to 70%. The left ventricle has normal function. The left ventricle has no  regional wall motion abnormalities. Definity contrast agent was given IV  to delineate the left ventricular   endocardial borders.  The left ventricular internal cavity size was normal  in size. There is severe concentric left ventricular hypertrophy. Left  ventricular diastolic function could not be evaluated due to atrial  fibrillation. Left ventricular diastolic   function could not be evaluated. Elevated left ventricular end-diastolic  pressure.   Right Ventricle: The right ventricular size is not well visualized. Right  vetricular wall thickness was not well visualized. Right ventricular  systolic function is moderately reduced. There is normal pulmonary artery  systolic pressure. The tricuspid  regurgitant velocity is 2.10 m/s, and with an assumed right atrial  pressure of 3 mmHg, the estimated right ventricular systolic pressure is  66.4 mmHg.   Left Atrium: Left atrial size was moderately dilated.   Right Atrium: Right atrial size was normal in size.   Pericardium: There is no evidence of pericardial effusion.   Mitral Valve: The mitral valve is abnormal. There is mild thickening of  the mitral valve leaflet(s). There is moderate calcification of the mitral  valve leaflet(s). Moderate mitral annular calcification. Trivial mitral  valve regurgitation. MV peak  gradient, 10.4 mmHg.  The mean mitral valve gradient is 3.0 mmHg.   Tricuspid Valve: The tricuspid valve is normal in structure. Tricuspid  valve regurgitation is trivial. No evidence of tricuspid stenosis.   Aortic Valve: The aortic valve is calcified. There is severe calcifcation  of the aortic valve. There is severe thickening of the aortic valve.  Aortic valve regurgitation is trivial. Severe aortic stenosis is present.  Aortic valve mean gradient measures  60.0 mmHg. Aortic valve peak gradient measures 109.0 mmHg. Aortic valve  area, by VTI measures 0.48 cm.   Pulmonic Valve: The pulmonic valve was not well visualized. Pulmonic valve  regurgitation is trivial. No evidence of pulmonic stenosis.   Aorta: The aortic root and ascending aorta are structurally normal, with  no evidence of dilitation.   Venous: The inferior vena cava is normal in size with greater than 50%  respiratory variability, suggesting right atrial pressure of 3 mmHg.   IAS/Shunts: The atrial septum is grossly normal.      LEFT VENTRICLE  PLAX 2D  LVIDd:         3.90 cm  Diastology  LVIDs:         2.40 cm  LV e' medial:    5.44 cm/s  LV PW:         1.70 cm  LV E/e' medial:  24.3  LV IVS:        1.60 cm  LV e' lateral:   7.94 cm/s  LVOT diam:     2.00 cm  LV E/e' lateral: 16.6  LV SV:         64  LV SV Index:   30  LVOT Area:     3.14 cm      RIGHT VENTRICLE  RV S prime:     7.72 cm/s  TAPSE (M-mode): 1.2 cm  RVSP:           20.6 mmHg   LEFT ATRIUM             Index       RIGHT ATRIUM           Index  LA diam:        4.20 cm 1.95 cm/m  RA Pressure: 3.00 mmHg  LA Vol (A2C):   76.5 ml 35.48 ml/m RA Area:     17.20 cm  LA Vol (A4C):   68.5 ml 31.77  ml/m RA Volume:   43.00 ml  19.94 ml/m  LA Biplane Vol: 74.3 ml 34.46 ml/m   AORTIC VALVE  AV Area (Vmax):    0.45 cm  AV Area (Vmean):   0.49 cm  AV Area (VTI):     0.48 cm  AV Vmax:           522.00 cm/s  AV Vmean:          364.000 cm/s  AV VTI:            1.340  m  AV Peak Grad:      109.0 mmHg  AV Mean Grad:      60.0 mmHg  LVOT Vmax:         75.00 cm/s  LVOT Vmean:        56.500 cm/s  LVOT VTI:          0.203 m  LVOT/AV VTI ratio: 0.15     AORTA  Ao Root diam: 2.90 cm  Ao Asc diam:  3.30 cm   MITRAL VALVE                TRICUSPID VALVE  MV Area (PHT): 2.11 cm     TR Peak grad:   17.6 mmHg  MV Area VTI:   1.61 cm     TR Vmax:        210.00 cm/s  MV Peak grad:  10.4 mmHg    Estimated RAP:  3.00 mmHg  MV Mean grad:  3.0 mmHg     RVSP:           20.6 mmHg  MV Vmax:       1.61 m/s  MV Vmean:      81.3 cm/s    SHUNTS  MV Decel Time: 359 msec     Systemic VTI:  0.20 m  MV E velocity: 132.00 cm/s  Systemic Diam: 2.00 cm   Vascular US: Summary:  Right Carotid: Velocities in the right ICA are consistent with a 1-39%  stenosis.   Left Carotid: Velocities in the left ICA are consistent with a 1-39%  stenosis.   Vertebrals: Bilateral vertebral arteries demonstrate antegrade flow.  STS RISK CALCULATOR: Isolated AVR: Risk of Mortality: 6.064% Renal Failure: 3.257% Permanent Stroke: 1.757% Prolonged Ventilation: 13.715% DSW Infection: 0.398% Reoperation: 2.891% Morbidity or Mortality: 16.850% Short Length of Stay: 13.045% Long Length of Stay: 11.135%   ASSESSMENT AND PLAN: 1.  Severe, stage D1 aortic stenosis.  The patient has had marked progression in the severity of her aortic stenosis over the past year, now with mean transaortic gradient of 60 mmHg.  She exhibits progressive New York Heart Association functional class III symptoms of chronic diastolic heart failure with shortness of breath and angina.  I have reviewed the natural history of aortic stenosis with the patient and their family members who are present today. We have discussed the limitations of medical therapy and the poor prognosis associated with symptomatic aortic stenosis. We have reviewed potential treatment options, including palliative medical therapy,  conventional surgical aortic valve replacement, and transcatheter aortic valve replacement. We discussed treatment options in the context of the patient's specific comorbid medical conditions.    The patient's echocardiogram is personally reviewed and demonstrates vigorous LV systolic function, severe LVH, severe calcific aortic stenosis with marked restriction of the aortic valve leaflets, a peak transaortic velocity of 5.2 m/s, a mean gradient of 60 mmHg, a peak gradient of 109 mmHg, and a calculated aortic valve  area of 0.5 cm.  Comorbidities including advanced age, morbid obesity, poor functional capacity, carotid artery disease, sleep apnea, and coronary artery disease with history of CABG, I will place her at high risk of surgery and she clearly would not be a candidate for conventional heart surgery.  TAVR is a reasonable treatment option to consider.  We also discussed palliative medical care but she strongly would like treatment if at all possible.  I have recommended right and left heart catheterization to assess bypass graft anatomy and cardiac hemodynamics.  The aortic valve does not need to be crossed as she has known critical aortic stenosis. I have reviewed the risks, indications, and alternatives to cardiac catheterization, possible angioplasty, and stenting with the patient. Risks include but are not limited to bleeding, infection, vascular injury, stroke, myocardial infection, arrhythmia, kidney injury, radiation-related injury in the case of prolonged fluoroscopy use, emergency cardiac surgery, and death. The patient understands the risks of serious complication is 1-2 in 4356 with diagnostic cardiac cath and 1-2% or less with angioplasty/stenting.  The patient will also undergo a gated cardiac CTA and a CTA of the chest, abdomen, and pelvis to assess whether she has suitable anatomy and vascular access for TAVR.  Once her studies are completed she will undergo formal cardiac surgical  consultation as part of a multidisciplinary approach to her care.  Deatra James 11/26/2020 7:44 PM     [place] [address]  [phone] (office) [phone] (fax)

## 2020-11-22 NOTE — H&P (View-Only) (Signed)
[facility]   MULTIDISCIPLINARY HEART VALVE TEAM  Date:  11/26/2020   ID:  Lori Price, DOB [birth year], MRN [id number]  PCP:  Glenda Chroman, MD   Chief Complaint  Patient presents with   Shortness of Breath      HISTORY OF PRESENT ILLNESS: Lori Price is a 87 y.o. female who presents for evaluation of critical aortic stenosis, referred by Dr Percival Spanish.  The patient is here with her husband today. They both come in with the use of a walker. They live in a single family home in the [facility] on [address]. They get around the campus with a golf cart. The patient uses a walker because of severe gait instability. She had a mechanical fall 3-4 years ago with a crush injury to the humerus treated surgically with a partial joint replacement and use of titanium rods in the right arm. She has been using a walker since that time and hasn't regained the full use of her right arm since then.   The patient reports a heart murmur since age 62. She has been followed for aortic stenosis by Dr Percival Spanish for over a decade, but only recently has she had marked progression from moderately severe aortic stenosis with a mean gradient of 37 mmHg last year to critical aortic stenosis found on her recent echo with peak and mean gradients of 109 and 60 mmHg, respectively.   The patient initially presented with an acute MI in 2001 and emergent cardiac catheterization showed severe 3 vessel CAD. The patient was taken for emergent CABG by Dr Prescott Gum and was treated with a LIMA-LAD, SVG-OM, and sequential SVG-RV marginal and distal RCA. She hasn't required any further interventions and she only recently began to develop exertional chest pressure and shortness of breath over the past 2 months. Prior to her MI, she had pain radiating to her upper back and neck, and in the past month she has been experiencing this again. She has to walk about 100 yards to the dining room and typically has  symptoms that are worse after she eats. Once she sits and rests, her symptoms resolve over 5-10 minutes. She does not experience symptoms of lightheadedness or syncope, but occasionally has 'vertigo.'   Past Medical History:  Diagnosis Date   Arthritis    Asthma    BPV (benign positional vertigo)    Carotid artery occlusion    Coronary artery disease    s/p MI 2001--(catheterizations in 2001 with left main 70% stenosis, LAD 70% followed by 90% stenosis, circumflex 99% stenosis, right coronary artery occluded.    Hyperlipidemia    Hypertension    Nephrolithiasis    Peripheral vascular disease (HCC)    Sleep apnea    CPAP   no   Vertigo     Current Outpatient Medications  Medication Sig Dispense Refill   acetaminophen (TYLENOL) 500 MG tablet Take 1,000 mg by mouth every 6 (six) hours as needed.     apixaban (ELIQUIS) 5 MG TABS tablet Take 1 tablet (5 mg total) by mouth 2 (two) times daily. 180 tablet 1   atenolol-chlorthalidone (TENORETIC) 100-25 MG tablet Take 1 tablet by mouth daily. 90 tablet 3   atorvastatin (LIPITOR) 10 MG tablet Take 1 tablet (10 mg total) by mouth daily. 90 tablet 3   bisacodyl (DULCOLAX) 5 MG EC tablet Take 5 mg by mouth daily as needed for moderate constipation.     BREO ELLIPTA 100-25 MCG/INH AEPB  as needed.     fexofenadine (ALLEGRA) 180 MG tablet Take 180 mg by mouth daily as needed for allergies.     fluconazole (DIFLUCAN) 100 MG tablet Take 1 tablet (100 mg total) by mouth daily. 10 tablet 0   folic acid (FOLVITE) 378 MCG tablet 400 mcg. Take one tablet by mouth every third day     furosemide (LASIX) 20 MG tablet TAKE (1/2) TO (1) TABLET DAILY AS NEEDED. 45 tablet 10   Mag Aspart-Potassium Aspart (ASPARTATE MG & K) 90-90 MG CAPS Take 1 capsule by mouth once a week. Take one tablet as needed     meclizine (ANTIVERT) 25 MG tablet Take 25 mg by mouth daily as needed for dizziness.     Multiple Vitamin (MULTIVITAMIN) capsule Take 1 capsule by mouth every  other day.     nystatin ointment (MYCOSTATIN) Apply 1 application topically 2 (two) times daily. 30 g 3   Omega-3 Fatty Acids (FISH OIL) 1000 MG CAPS Take 1,000 mg by mouth daily.     No current facility-administered medications for this visit.    ALLERGIES:   Codeine, Losartan, Oxycodone, Penicillins, Pentazocine, Quinapril, Sibutramine, Sulfamethoxazole, Sulfonamide derivatives, Ace inhibitors, Ak-mycin [erythromycin], Drug ingredient [modified tree tyrosine adsorbate], Eggs or egg-derived products, Losartan potassium, Other, and Penicillins   SOCIAL HISTORY:  The patient  reports that she has never smoked. She has never used smokeless tobacco. She reports previous alcohol use. She reports previous drug use.   FAMILY HISTORY:  The patient's family history includes Aneurysm in her father; COPD in her father; Cancer in her mother; Heart disease in her maternal grandfather and maternal grandmother; Kidney disease in her father; Ovarian cancer in her mother.   REVIEW OF SYSTEMS:  Positive for back pain, orthopnea, gait instability.   All other systems are reviewed and negative.   PHYSICAL EXAM: VS:  BP 110/64   Pulse 70   Ht 5\' 7"  (1.702 m)   Wt 234 lb 12.8 oz (106.5 kg)   SpO2 93%   BMI 36.77 kg/m  , BMI Body mass index is 36.77 kg/m. GEN: Elderly, obese woman in no acute distress HEENT: normal Neck: No JVD. carotids 2+ without bruits or masses Cardiac: The heart is RRR with a grade 3/6 harsh late peaking systolic murmur with absent A2.  No edema. Pedal pulses 2+ = bilaterally  Respiratory:  clear to auscultation bilaterally GI: soft, nontender, nondistended, + BS, obese MS: no deformity or atrophy Skin: warm and dry, bilateral groin rash present Neuro:  Strength and sensation are intact Psych: euthymic mood, full affect  EKG:  EKG from today reviewed and demonstrates atrial fibrillation with possible age-indeterminate inferior and anterior MI  RECENT LABS: 09/29/2020: NT-Pro BNP  1,888 11/22/2020: BUN 25; Creatinine, Ser 1.12; Hemoglobin 15.0; Platelets 242; Potassium 3.8; Sodium 140  No results found for requested labs within last 8760 hours.   Estimated Creatinine Clearance: 45.3 mL/min (A) (by C-G formula based on SCr of 1.12 mg/dL (H)).   Wt Readings from Last 3 Encounters:  11/22/20 234 lb 12.8 oz (106.5 kg)  11/17/20 230 lb 12.8 oz (104.7 kg)  09/29/20 232 lb 9.6 oz (105.5 kg)     CARDIAC STUDIES: Echo:  FINDINGS   Left Ventricle: Left ventricular ejection fraction, by estimation, is 65  to 70%. The left ventricle has normal function. The left ventricle has no  regional wall motion abnormalities. Definity contrast agent was given IV  to delineate the left ventricular   endocardial borders.  The left ventricular internal cavity size was normal  in size. There is severe concentric left ventricular hypertrophy. Left  ventricular diastolic function could not be evaluated due to atrial  fibrillation. Left ventricular diastolic   function could not be evaluated. Elevated left ventricular end-diastolic  pressure.   Right Ventricle: The right ventricular size is not well visualized. Right  vetricular wall thickness was not well visualized. Right ventricular  systolic function is moderately reduced. There is normal pulmonary artery  systolic pressure. The tricuspid  regurgitant velocity is 2.10 m/s, and with an assumed right atrial  pressure of 3 mmHg, the estimated right ventricular systolic pressure is  93.7 mmHg.   Left Atrium: Left atrial size was moderately dilated.   Right Atrium: Right atrial size was normal in size.   Pericardium: There is no evidence of pericardial effusion.   Mitral Valve: The mitral valve is abnormal. There is mild thickening of  the mitral valve leaflet(s). There is moderate calcification of the mitral  valve leaflet(s). Moderate mitral annular calcification. Trivial mitral  valve regurgitation. MV peak  gradient, 10.4 mmHg.  The mean mitral valve gradient is 3.0 mmHg.   Tricuspid Valve: The tricuspid valve is normal in structure. Tricuspid  valve regurgitation is trivial. No evidence of tricuspid stenosis.   Aortic Valve: The aortic valve is calcified. There is severe calcifcation  of the aortic valve. There is severe thickening of the aortic valve.  Aortic valve regurgitation is trivial. Severe aortic stenosis is present.  Aortic valve mean gradient measures  60.0 mmHg. Aortic valve peak gradient measures 109.0 mmHg. Aortic valve  area, by VTI measures 0.48 cm.   Pulmonic Valve: The pulmonic valve was not well visualized. Pulmonic valve  regurgitation is trivial. No evidence of pulmonic stenosis.   Aorta: The aortic root and ascending aorta are structurally normal, with  no evidence of dilitation.   Venous: The inferior vena cava is normal in size with greater than 50%  respiratory variability, suggesting right atrial pressure of 3 mmHg.   IAS/Shunts: The atrial septum is grossly normal.      LEFT VENTRICLE  PLAX 2D  LVIDd:         3.90 cm  Diastology  LVIDs:         2.40 cm  LV e' medial:    5.44 cm/s  LV PW:         1.70 cm  LV E/e' medial:  24.3  LV IVS:        1.60 cm  LV e' lateral:   7.94 cm/s  LVOT diam:     2.00 cm  LV E/e' lateral: 16.6  LV SV:         64  LV SV Index:   30  LVOT Area:     3.14 cm      RIGHT VENTRICLE  RV S prime:     7.72 cm/s  TAPSE (M-mode): 1.2 cm  RVSP:           20.6 mmHg   LEFT ATRIUM             Index       RIGHT ATRIUM           Index  LA diam:        4.20 cm 1.95 cm/m  RA Pressure: 3.00 mmHg  LA Vol (A2C):   76.5 ml 35.48 ml/m RA Area:     17.20 cm  LA Vol (A4C):   68.5 ml 31.77  ml/m RA Volume:   43.00 ml  19.94 ml/m  LA Biplane Vol: 74.3 ml 34.46 ml/m   AORTIC VALVE  AV Area (Vmax):    0.45 cm  AV Area (Vmean):   0.49 cm  AV Area (VTI):     0.48 cm  AV Vmax:           522.00 cm/s  AV Vmean:          364.000 cm/s  AV VTI:            1.340  m  AV Peak Grad:      109.0 mmHg  AV Mean Grad:      60.0 mmHg  LVOT Vmax:         75.00 cm/s  LVOT Vmean:        56.500 cm/s  LVOT VTI:          0.203 m  LVOT/AV VTI ratio: 0.15     AORTA  Ao Root diam: 2.90 cm  Ao Asc diam:  3.30 cm   MITRAL VALVE                TRICUSPID VALVE  MV Area (PHT): 2.11 cm     TR Peak grad:   17.6 mmHg  MV Area VTI:   1.61 cm     TR Vmax:        210.00 cm/s  MV Peak grad:  10.4 mmHg    Estimated RAP:  3.00 mmHg  MV Mean grad:  3.0 mmHg     RVSP:           20.6 mmHg  MV Vmax:       1.61 m/s  MV Vmean:      81.3 cm/s    SHUNTS  MV Decel Time: 359 msec     Systemic VTI:  0.20 m  MV E velocity: 132.00 cm/s  Systemic Diam: 2.00 cm   Vascular US: Summary:  Right Carotid: Velocities in the right ICA are consistent with a 1-39%  stenosis.   Left Carotid: Velocities in the left ICA are consistent with a 1-39%  stenosis.   Vertebrals: Bilateral vertebral arteries demonstrate antegrade flow.  STS RISK CALCULATOR: Isolated AVR: Risk of Mortality: 6.064% Renal Failure: 3.257% Permanent Stroke: 1.757% Prolonged Ventilation: 13.715% DSW Infection: 0.398% Reoperation: 2.891% Morbidity or Mortality: 16.850% Short Length of Stay: 13.045% Long Length of Stay: 11.135%   ASSESSMENT AND PLAN: 1.  Severe, stage D1 aortic stenosis.  The patient has had marked progression in the severity of her aortic stenosis over the past year, now with mean transaortic gradient of 60 mmHg.  She exhibits progressive New York Heart Association functional class III symptoms of chronic diastolic heart failure with shortness of breath and angina.  I have reviewed the natural history of aortic stenosis with the patient and their family members who are present today. We have discussed the limitations of medical therapy and the poor prognosis associated with symptomatic aortic stenosis. We have reviewed potential treatment options, including palliative medical therapy,  conventional surgical aortic valve replacement, and transcatheter aortic valve replacement. We discussed treatment options in the context of the patient's specific comorbid medical conditions.    The patient's echocardiogram is personally reviewed and demonstrates vigorous LV systolic function, severe LVH, severe calcific aortic stenosis with marked restriction of the aortic valve leaflets, a peak transaortic velocity of 5.2 m/s, a mean gradient of 60 mmHg, a peak gradient of 109 mmHg, and a calculated aortic valve  area of 0.5 cm.  Comorbidities including advanced age, morbid obesity, poor functional capacity, carotid artery disease, sleep apnea, and coronary artery disease with history of CABG, I will place her at high risk of surgery and she clearly would not be a candidate for conventional heart surgery.  TAVR is a reasonable treatment option to consider.  We also discussed palliative medical care but she strongly would like treatment if at all possible.  I have recommended right and left heart catheterization to assess bypass graft anatomy and cardiac hemodynamics.  The aortic valve does not need to be crossed as she has known critical aortic stenosis. I have reviewed the risks, indications, and alternatives to cardiac catheterization, possible angioplasty, and stenting with the patient. Risks include but are not limited to bleeding, infection, vascular injury, stroke, myocardial infection, arrhythmia, kidney injury, radiation-related injury in the case of prolonged fluoroscopy use, emergency cardiac surgery, and death. The patient understands the risks of serious complication is 1-2 in 2542 with diagnostic cardiac cath and 1-2% or less with angioplasty/stenting.  The patient will also undergo a gated cardiac CTA and a CTA of the chest, abdomen, and pelvis to assess whether she has suitable anatomy and vascular access for TAVR.  Once her studies are completed she will undergo formal cardiac surgical  consultation as part of a multidisciplinary approach to her care.  Deatra James 11/26/2020 7:44 PM     [place] [place] [address]  [phone] (office) [phone] (fax)

## 2020-11-22 NOTE — Patient Instructions (Addendum)
1) You have been given a prescription for Nystatin ointment. Please apply to affected area twice daily. 2) You have been given a prescription for Diflucan 100 mg. Please take 1 tablet daily for 10 days.  Please make an appointment with your dentist ASAP.  CATHETERIZATION INSTRUCTIONS (7/15): You are scheduled for a Cardiac Catheterization on Friday, July 15 with Dr. Lauree Chandler.  1. Please arrive at the [facility] (Main Entrance A) at [facility]: [address] [place], [place] [address] at 8:30 AM (This time is two hours before your procedure to ensure your preparation). Free valet parking service is available. You are allowed ONE visitor in the waiting room during your procedure. Both you and your guest must wear masks. Special note: Every effort is made to have your procedure done on time. Please understand that emergencies sometimes delay scheduled procedures.  2. Diet: Do not eat solid foods after midnight.  You may have clear liquids until 5am upon the day of the procedure.  3. Labs: TODAY! BMET, CBC  4. Medication instructions in preparation for your procedure:  1) HOLD ELIQUIS two days prior to your cath (take 7/12 PM dose then stop)  2) ONLY TAKE ASPIRIN 81 mg the morning of your cath  5. Plan for one night stay--bring personal belongings. 6. Bring a current list of your medications and current insurance cards. 7. You MUST have a responsible person to drive you home. 8. Someone MUST be with you the first 24 hours after you arrive home or your discharge will be delayed. 9. Please wear clothes that are easy to get on and off and wear slip-on shoes.  Thank you for allowing [place] to care for you!   --  Invasive Cardiovascular services

## 2020-11-23 LAB — CBC WITH DIFFERENTIAL/PLATELET
Basophils Absolute: 0 10*3/uL (ref 0.0–0.2)
Basos: 1 %
EOS (ABSOLUTE): 0.3 10*3/uL (ref 0.0–0.4)
Eos: 3 %
Hematocrit: 45.4 % (ref 34.0–46.6)
Hemoglobin: 15 g/dL (ref 11.1–15.9)
Immature Grans (Abs): 0 10*3/uL (ref 0.0–0.1)
Immature Granulocytes: 1 %
Lymphocytes Absolute: 1.5 10*3/uL (ref 0.7–3.1)
Lymphs: 17 %
MCH: 29.2 pg (ref 26.6–33.0)
MCHC: 33 g/dL (ref 31.5–35.7)
MCV: 89 fL (ref 79–97)
Monocytes Absolute: 0.8 10*3/uL (ref 0.1–0.9)
Monocytes: 9 %
Neutrophils Absolute: 6.2 10*3/uL (ref 1.4–7.0)
Neutrophils: 69 %
Platelets: 242 10*3/uL (ref 150–450)
RBC: 5.13 x10E6/uL (ref 3.77–5.28)
RDW: 14.5 % (ref 11.7–15.4)
WBC: 8.9 10*3/uL (ref 3.4–10.8)

## 2020-11-23 LAB — BASIC METABOLIC PANEL
BUN/Creatinine Ratio: 22 (ref 12–28)
BUN: 25 mg/dL (ref 8–27)
CO2: 26 mmol/L (ref 20–29)
Calcium: 9.8 mg/dL (ref 8.7–10.3)
Chloride: 98 mmol/L (ref 96–106)
Creatinine, Ser: 1.12 mg/dL — ABNORMAL HIGH (ref 0.57–1.00)
Glucose: 140 mg/dL — ABNORMAL HIGH (ref 65–99)
Potassium: 3.8 mmol/L (ref 3.5–5.2)
Sodium: 140 mmol/L (ref 134–144)
eGFR: 48 mL/min/{1.73_m2} — ABNORMAL LOW (ref >59)

## 2020-11-24 ENCOUNTER — Ambulatory Visit: Payer: Medicare Other | Admitting: Cardiology

## 2020-11-26 ENCOUNTER — Encounter: Payer: Self-pay | Admitting: Cardiovascular Disease

## 2020-11-28 ENCOUNTER — Telehealth: Payer: Self-pay | Admitting: Cardiovascular Disease

## 2020-11-28 NOTE — Telephone Encounter (Signed)
Patient is requesting to go over instructions for heart cath, scheduled for 12/01/20 with Dr. Angelena Form. Specifically, she would like to know whether or not she needs to hold her Eliquis.

## 2020-11-28 NOTE — Telephone Encounter (Signed)
Spoke with the pt and went over her instructions: She verbalized understanding. I also sent to her My Chart.   CATHETERIZATION INSTRUCTIONS (7/15): You are scheduled for a Cardiac Catheterization on Friday, July 15 with Dr. Lauree Chandler.   1. Please arrive at the [facility] (Main Entrance A) at [facility]: [address] [place], [place] [address] at 8:30 AM (This time is two hours before your procedure to ensure your preparation). Free valet parking service is available. You are allowed ONE visitor in the waiting room during your procedure. Both you and your guest must wear masks. Special note: Every effort is made to have your procedure done on time. Please understand that emergencies sometimes delay scheduled procedures.   2. Diet: Do not eat solid foods after midnight.  You may have clear liquids until 5am upon the day of the procedure.   3. Labs: TODAY! BMET, CBC   4. Medication instructions in preparation for your procedure:              1) HOLD ELIQUIS two days prior to your cath (take 7/12 PM dose then stop)              2) ONLY TAKE ASPIRIN 81 mg the morning of your cath   5. Plan for one night stay--bring personal belongings. 6. Bring a current list of your medications and current insurance cards. 7. You MUST have a responsible person to drive you home. 8. Someone MUST be with you the first 24 hours after you arrive home or your discharge will be delayed. 9. Please wear clothes that are easy to get on and off and wear slip-on shoes.   Thank you for allowing [place] to care for you!   -- [place] [facility] services

## 2020-11-30 ENCOUNTER — Telehealth: Payer: Self-pay | Admitting: *Deleted

## 2020-11-30 NOTE — Telephone Encounter (Signed)
Pt contacted pre-catheterization scheduled at [facility] for: Friday December 01, 2020 10:30 AM Verified arrival time and place: [facility]) at: 8:30 AM   No solid food after midnight prior to cath, clear liquids until 5 AM day of procedure.  Hold: Eliquis-none 11/29/20 until post procedure Atenolol/chlorthalidone -day before and day of procedure-GFR 48 Lasix-will take 1/2 day before and hold day of procedure-GFR 48   Except hold medications AM meds can be  taken pre-cath with sips of water including: aspirin 81 mg   Confirmed patient has responsible adult to drive home post procedure and be with patient first 24 hours after arriving home: yes  You are allowed ONE visitor in the waiting room during the time you are at the hospital for your procedure. Both you and your visitor must wear a mask once you enter the hospital.   Patient reports does not currently have any symptoms concerning for COVID-19 and no household members with COVID-19 like illness.      Reviewed procedure/mask/visitor instructions reviewed with patient.

## 2020-11-30 NOTE — Telephone Encounter (Signed)
Patient was seen by Dr Percival Spanish 7/1

## 2020-12-01 ENCOUNTER — Ambulatory Visit (HOSPITAL_COMMUNITY)
Admission: RE | Admit: 2020-12-01 | Discharge: 2020-12-01 | Disposition: A | Discharge status: Home / Self Care | Payer: Medicare Other | Attending: Cardiovascular Disease | Admitting: Cardiovascular Disease

## 2020-12-01 ENCOUNTER — Ambulatory Visit (HOSPITAL_COMMUNITY)
Admission: RE | Disposition: A | Discharge status: Home / Self Care | Payer: Self-pay | Source: Home / Self Care | Attending: Cardiovascular Disease

## 2020-12-01 ENCOUNTER — Encounter (HOSPITAL_COMMUNITY): Payer: Self-pay | Admitting: Cardiovascular Disease

## 2020-12-01 ENCOUNTER — Other Ambulatory Visit: Payer: Self-pay

## 2020-12-01 DIAGNOSIS — Z951 Presence of aortocoronary bypass graft: Secondary | ICD-10-CM | POA: Insufficient documentation

## 2020-12-01 DIAGNOSIS — G4730 Sleep apnea, unspecified: Secondary | ICD-10-CM | POA: Insufficient documentation

## 2020-12-01 DIAGNOSIS — I2510 Atherosclerotic heart disease of native coronary artery without angina pectoris: Secondary | ICD-10-CM

## 2020-12-01 DIAGNOSIS — I25810 Atherosclerosis of coronary artery bypass graft(s) without angina pectoris: Secondary | ICD-10-CM

## 2020-12-01 DIAGNOSIS — Z885 Allergy status to narcotic agent status: Secondary | ICD-10-CM | POA: Insufficient documentation

## 2020-12-01 DIAGNOSIS — Z880 Allergy status to penicillin: Secondary | ICD-10-CM | POA: Insufficient documentation

## 2020-12-01 DIAGNOSIS — Z7901 Long term (current) use of anticoagulants: Secondary | ICD-10-CM | POA: Insufficient documentation

## 2020-12-01 DIAGNOSIS — I6529 Occlusion and stenosis of unspecified carotid artery: Secondary | ICD-10-CM | POA: Diagnosis not present

## 2020-12-01 DIAGNOSIS — I25119 Atherosclerotic heart disease of native coronary artery with unspecified angina pectoris: Secondary | ICD-10-CM | POA: Insufficient documentation

## 2020-12-01 DIAGNOSIS — I5032 Chronic diastolic (congestive) heart failure: Secondary | ICD-10-CM | POA: Diagnosis not present

## 2020-12-01 DIAGNOSIS — I350 Nonrheumatic aortic (valve) stenosis: Secondary | ICD-10-CM | POA: Diagnosis not present

## 2020-12-01 DIAGNOSIS — Z6836 Body mass index (BMI) 36.0-36.9, adult: Secondary | ICD-10-CM | POA: Insufficient documentation

## 2020-12-01 DIAGNOSIS — Z882 Allergy status to sulfonamides status: Secondary | ICD-10-CM | POA: Insufficient documentation

## 2020-12-01 DIAGNOSIS — Z79899 Other long term (current) drug therapy: Secondary | ICD-10-CM | POA: Insufficient documentation

## 2020-12-01 DIAGNOSIS — Z91012 Allergy to eggs: Secondary | ICD-10-CM | POA: Insufficient documentation

## 2020-12-01 DIAGNOSIS — Z888 Allergy status to other drugs, medicaments and biological substances status: Secondary | ICD-10-CM | POA: Diagnosis not present

## 2020-12-01 HISTORY — PX: RIGHT/LEFT HEART CATH AND CORONARY/GRAFT ANGIOGRAPHY: CATH118267

## 2020-12-01 LAB — POCT I-STAT 7, (LYTES, BLD GAS, ICA,H+H)
Acid-Base Excess: 2 mmol/L (ref 0.0–2.0)
Bicarbonate: 28.3 mmol/L — ABNORMAL HIGH (ref 20.0–28.0)
Calcium, Ion: 1.2 mmol/L (ref 1.15–1.40)
HCT: 41 % (ref 36.0–46.0)
Hemoglobin: 13.9 g/dL (ref 12.0–15.0)
O2 Saturation: 98 %
Potassium: 3.5 mmol/L (ref 3.5–5.1)
Sodium: 142 mmol/L (ref 135–145)
TCO2: 30 mmol/L (ref 22–32)
pCO2 arterial: 50.6 mmHg — ABNORMAL HIGH (ref 32.0–48.0)
pH, Arterial: 7.355 (ref 7.350–7.450)
pO2, Arterial: 108 mmHg (ref 83.0–108.0)

## 2020-12-01 LAB — POCT I-STAT EG7
Acid-Base Excess: 3 mmol/L — ABNORMAL HIGH (ref 0.0–2.0)
Bicarbonate: 30.3 mmol/L — ABNORMAL HIGH (ref 20.0–28.0)
Calcium, Ion: 1.23 mmol/L (ref 1.15–1.40)
HCT: 42 % (ref 36.0–46.0)
Hemoglobin: 14.3 g/dL (ref 12.0–15.0)
O2 Saturation: 71 %
Potassium: 3.5 mmol/L (ref 3.5–5.1)
Sodium: 143 mmol/L (ref 135–145)
TCO2: 32 mmol/L (ref 22–32)
pCO2, Ven: 57.8 mmHg (ref 44.0–60.0)
pH, Ven: 7.328 (ref 7.250–7.430)
pO2, Ven: 41 mmHg (ref 32.0–45.0)

## 2020-12-01 SURGERY — RIGHT/LEFT HEART CATH AND CORONARY/GRAFT ANGIOGRAPHY
Anesthesia: LOCAL

## 2020-12-01 MED ORDER — HEPARIN SODIUM (PORCINE) 1000 UNIT/ML IJ SOLN
INTRAMUSCULAR | Status: AC
  Filled 2020-12-01: qty 1

## 2020-12-01 MED ORDER — SODIUM CHLORIDE 0.9% FLUSH: 3.0000 mL | Freq: Two times a day (BID) | INTRAVENOUS | Status: DC

## 2020-12-01 MED ORDER — VERAPAMIL HCL 2.5 MG/ML IV SOLN
INTRAVENOUS | Status: DC | PRN
  Administered 2020-12-01: 10 mL via INTRA_ARTERIAL

## 2020-12-01 MED ORDER — LABETALOL HCL 5 MG/ML IV SOLN: 10.0000 mg | INTRAVENOUS | Status: DC | PRN

## 2020-12-01 MED ORDER — HEPARIN SODIUM (PORCINE) 1000 UNIT/ML IJ SOLN
INTRAMUSCULAR | Status: DC | PRN
  Administered 2020-12-01: 6000 [IU] via INTRAVENOUS

## 2020-12-01 MED ORDER — SODIUM CHLORIDE 0.9 % WEIGHT BASED INFUSION: 1.0000 mL/kg/h | INTRAVENOUS | Status: DC

## 2020-12-01 MED ORDER — SODIUM CHLORIDE 0.9% FLUSH: 3.0000 mL | INTRAVENOUS | Status: DC | PRN

## 2020-12-01 MED ORDER — FENTANYL CITRATE (PF) 100 MCG/2ML IJ SOLN
INTRAMUSCULAR | Status: DC | PRN
  Administered 2020-12-01: 25 ug via INTRAVENOUS

## 2020-12-01 MED ORDER — IOHEXOL 350 MG/ML SOLN
INTRAVENOUS | Status: DC | PRN
  Administered 2020-12-01: 65 mL

## 2020-12-01 MED ORDER — HEPARIN (PORCINE) IN NACL 1000-0.9 UT/500ML-% IV SOLN
INTRAVENOUS | Status: AC
  Filled 2020-12-01: qty 1500

## 2020-12-01 MED ORDER — SODIUM CHLORIDE 0.9 % IV SOLN: 250.0000 mL | INTRAVENOUS | Status: DC | PRN

## 2020-12-01 MED ORDER — HEPARIN (PORCINE) IN NACL 1000-0.9 UT/500ML-% IV SOLN
INTRAVENOUS | Status: DC | PRN
  Administered 2020-12-01: 500 mL

## 2020-12-01 MED ORDER — ASPIRIN 81 MG PO CHEW: 81.0000 mg | CHEWABLE_TABLET | ORAL | Status: DC

## 2020-12-01 MED ORDER — ONDANSETRON HCL 4 MG/2ML IJ SOLN: 4.0000 mg | Freq: Four times a day (QID) | INTRAMUSCULAR | Status: DC | PRN

## 2020-12-01 MED ORDER — VERAPAMIL HCL 2.5 MG/ML IV SOLN
INTRAVENOUS | Status: AC
  Filled 2020-12-01: qty 2

## 2020-12-01 MED ORDER — SODIUM CHLORIDE 0.9 % WEIGHT BASED INFUSION
3.0000 mL/kg/h | INTRAVENOUS | Status: AC
  Administered 2020-12-01: 3 mL/kg/h via INTRAVENOUS

## 2020-12-01 MED ORDER — SODIUM CHLORIDE 0.9 % IV SOLN: INTRAVENOUS | Status: AC

## 2020-12-01 MED ORDER — ACETAMINOPHEN 325 MG PO TABS: 650.0000 mg | ORAL_TABLET | ORAL | Status: DC | PRN

## 2020-12-01 MED ORDER — LIDOCAINE HCL (PF) 1 % IJ SOLN
INTRAMUSCULAR | Status: DC | PRN
  Administered 2020-12-01: 1 mL
  Administered 2020-12-01: 2 mL

## 2020-12-01 MED ORDER — MIDAZOLAM HCL 2 MG/2ML IJ SOLN
INTRAMUSCULAR | Status: DC | PRN
  Administered 2020-12-01: 1 mg via INTRAVENOUS

## 2020-12-01 MED ORDER — FENTANYL CITRATE (PF) 100 MCG/2ML IJ SOLN
INTRAMUSCULAR | Status: AC
  Filled 2020-12-01: qty 2

## 2020-12-01 MED ORDER — LIDOCAINE HCL (PF) 1 % IJ SOLN
INTRAMUSCULAR | Status: AC
  Filled 2020-12-01: qty 30

## 2020-12-01 MED ORDER — HYDRALAZINE HCL 20 MG/ML IJ SOLN: 10.0000 mg | INTRAMUSCULAR | Status: DC | PRN

## 2020-12-01 MED ORDER — MIDAZOLAM HCL 2 MG/2ML IJ SOLN
INTRAMUSCULAR | Status: AC
  Filled 2020-12-01: qty 2

## 2020-12-01 SURGICAL SUPPLY — 14 items
CATH BALLN WEDGE 5F 110CM (CATHETERS) ×1 IMPLANT
CATH INFINITI 5FR AL1 (CATHETERS) ×1 IMPLANT
CATH INFINITI 5FR MULTPACK ANG (CATHETERS) ×2 IMPLANT
DEVICE RAD COMP TR BAND LRG (VASCULAR PRODUCTS) ×1 IMPLANT
ELECT DEFIB PAD ADLT CADENCE (PAD) ×1 IMPLANT
GLIDESHEATH SLEND SS 6F .021 (SHEATH) ×2 IMPLANT
GUIDEWIRE INQWIRE 1.5J.035X260 (WIRE) IMPLANT
INQWIRE 1.5J .035X260CM (WIRE) ×2
KIT HEART LEFT (KITS) ×2 IMPLANT
PACK CARDIAC CATHETERIZATION (CUSTOM PROCEDURE TRAY) ×2 IMPLANT
SHEATH GLIDE SLENDER 4/5FR (SHEATH) ×1 IMPLANT
TRANSDUCER W/STOPCOCK (MISCELLANEOUS) ×2 IMPLANT
TUBING CIL FLEX 10 FLL-RA (TUBING) ×2 IMPLANT
WIRE EMERALD ST .035X260CM (WIRE) ×1 IMPLANT

## 2020-12-01 NOTE — Discharge Instructions (Signed)
Radial Site Care  This sheet gives you information about how to care for yourself after your procedure. Your health care provider may also give you more specific instructions. If you have problems or questions, contact your health care provider. What can I expect after the procedure? After the procedure, it is common to have: Bruising and tenderness at the catheter insertion area. Follow these instructions at home: Medicines Take over-the-counter and prescription medicines only as told by your health care provider. Insertion site care Follow instructions from your health care provider about how to take care of your insertion site. Make sure you: Wash your hands with soap and water before you remove your bandage (dressing). If soap and water are not available, use hand sanitizer. May remove dressing in 24 hours. Check your insertion site every day for signs of infection. Check for: Redness, swelling, or pain. Fluid or blood. Pus or a bad smell. Warmth. Do no take baths, swim, or use a hot tub for 5 days. You may shower 24-48 hours after the procedure. Remove the dressing and gently wash the site with plain soap and water. Pat the area dry with a clean towel. Do not rub the site. That could cause bleeding. Do not apply powder or lotion to the site. Activity  For 24 hours after the procedure, or as directed by your health care provider: Do not flex or bend the affected arm. Do not push or pull heavy objects with the affected arm. Do not drive yourself home from the hospital or clinic. You may drive 24 hours after the procedure. Do not operate machinery or power tools. KEEP ARM ELEVATED THE REMAINDER OF THE DAY. Do not push, pull or lift anything that is heavier than 10 lb for 5 days. Ask your health care provider when it is okay to: Return to work or school. Resume usual physical activities or sports. Resume sexual activity. General instructions If the catheter site starts to  bleed, raise your arm and put firm pressure on the site. If the bleeding does not stop, get help right away. This is a medical emergency. DRINK PLENTY OF FLUIDS FOR THE NEXT 2-3 DAYS. No alcohol consumption for 24 hours after receiving sedation. If you went home on the same day as your procedure, a responsible adult should be with you for the first 24 hours after you arrive home. Keep all follow-up visits as told by your health care provider. This is important. Contact a health care provider if: You have a fever. You have redness, swelling, or yellow drainage around your insertion site. Get help right away if: You have unusual pain at the radial site. The catheter insertion area swells very fast. The insertion area is bleeding, and the bleeding does not stop when you hold steady pressure on the area. Your arm or hand becomes pale, cool, tingly, or numb. These symptoms may represent a serious problem that is an emergency. Do not wait to see if the symptoms will go away. Get medical help right away. Call your local emergency services (911 in the [place] Do not drive yourself to the hospital. Summary After the procedure, it is common to have bruising and tenderness at the site. Follow instructions from your health care provider about how to take care of your radial site wound. Check the wound every day for signs of infection.  This information is not intended to replace advice given to you by your health care provider. Make sure you discuss any questions you have with   your health care provider. Document Revised: 06/11/2017 Document Reviewed: 06/11/2017 Elsevier Patient Education  2020 [employer]  

## 2020-12-01 NOTE — Interval H&P Note (Signed)
History and Physical Interval Note:  12/01/2020 9:35 AM  Lori Price  has presented today for surgery, with the diagnosis of aortic stenosis.  The various methods of treatment have been discussed with the patient and family. After consideration of risks, benefits and other options for treatment, the patient has consented to  Procedure(s): RIGHT/LEFT HEART CATH AND CORONARY/GRAFT ANGIOGRAPHY (N/A) as a surgical intervention.  The patient's history has been reviewed, patient examined, no change in status, stable for surgery.  I have reviewed the patient's chart and labs.  Questions were answered to the patient's satisfaction.    Cath Lab Visit (complete for each Cath Lab visit)  Clinical Evaluation Leading to the Procedure:   ACS: No.  Non-ACS:    Anginal Classification: No Symptoms  Anti-ischemic medical therapy: Minimal Therapy (1 class of medications)  Non-Invasive Test Results: No non-invasive testing performed  Prior CABG: Previous CABG        Lauree Chandler

## 2020-12-01 NOTE — Progress Notes (Signed)
Discharge instructions reviewed with pt and her husband. Both voice understanding.  

## 2020-12-04 MED FILL — Heparin Sod (Porcine)-NaCl IV Soln 1000 Unit/500ML-0.9%: INTRAVENOUS | Qty: 500 | Status: AC

## 2020-12-06 ENCOUNTER — Ambulatory Visit: Payer: Medicare Other | Admitting: Physical Therapy

## 2020-12-07 ENCOUNTER — Other Ambulatory Visit: Payer: Self-pay

## 2020-12-07 ENCOUNTER — Ambulatory Visit (HOSPITAL_COMMUNITY)
Admission: RE | Admit: 2020-12-07 | Discharge: 2020-12-07 | Disposition: A | Discharge status: Home / Self Care | Payer: Medicare Other | Source: Home / Self Care | Attending: Cardiovascular Disease | Admitting: Cardiovascular Disease

## 2020-12-07 DIAGNOSIS — G4730 Sleep apnea, unspecified: Secondary | ICD-10-CM | POA: Diagnosis not present

## 2020-12-07 DIAGNOSIS — Z951 Presence of aortocoronary bypass graft: Secondary | ICD-10-CM | POA: Diagnosis not present

## 2020-12-07 DIAGNOSIS — I25119 Atherosclerotic heart disease of native coronary artery with unspecified angina pectoris: Secondary | ICD-10-CM | POA: Diagnosis not present

## 2020-12-07 DIAGNOSIS — K5730 Diverticulosis of large intestine without perforation or abscess without bleeding: Secondary | ICD-10-CM | POA: Diagnosis not present

## 2020-12-07 DIAGNOSIS — I700 Atherosclerosis of aorta: Secondary | ICD-10-CM | POA: Diagnosis not present

## 2020-12-07 DIAGNOSIS — I350 Nonrheumatic aortic (valve) stenosis: Secondary | ICD-10-CM

## 2020-12-07 DIAGNOSIS — I517 Cardiomegaly: Secondary | ICD-10-CM | POA: Diagnosis not present

## 2020-12-07 DIAGNOSIS — K8020 Calculus of gallbladder without cholecystitis without obstruction: Secondary | ICD-10-CM | POA: Diagnosis not present

## 2020-12-07 DIAGNOSIS — I6529 Occlusion and stenosis of unspecified carotid artery: Secondary | ICD-10-CM | POA: Diagnosis not present

## 2020-12-07 MED ORDER — IOHEXOL 350 MG/ML SOLN
100.0000 mL | Freq: Once | INTRAVENOUS | Status: AC | PRN
  Administered 2020-12-07: 100 mL via INTRAVENOUS

## 2020-12-19 ENCOUNTER — Ambulatory Visit: Payer: Medicare Other | Attending: Cardiovascular Disease | Admitting: Physical Therapy

## 2020-12-19 ENCOUNTER — Encounter: Payer: Self-pay | Admitting: Physical Therapy

## 2020-12-19 ENCOUNTER — Other Ambulatory Visit: Payer: Self-pay

## 2020-12-19 DIAGNOSIS — R2689 Other abnormalities of gait and mobility: Secondary | ICD-10-CM | POA: Insufficient documentation

## 2020-12-19 NOTE — Therapy (Signed)
[facility], [place], [address] Phone: [phone]   Fax:  [phone]  Physical Therapy Evaluation  Patient Details  Name: Lori Price MRN: [id number] Date of Birth: 12-Aug-1931 Referring Provider (PT): Sherren Mocha, MD   Encounter Date: 12/19/2020   PT End of Session - 12/19/20 1551     Visit Number 1    Number of Visits 1    Date for PT Re-Evaluation 12/20/20    PT Start Time 1500    PT Stop Time 1535    PT Time Calculation (min) 35 min    Activity Tolerance Patient tolerated treatment well    Behavior During Therapy [facility] for tasks assessed/performed             Past Medical History:  Diagnosis Date   Arthritis    Asthma    BPV (benign positional vertigo)    Carotid artery occlusion    Coronary artery disease    s/p MI 2001--(catheterizations in 2001 with left main 70% stenosis, LAD 70% followed by 90% stenosis, circumflex 99% stenosis, right coronary artery occluded.    Hyperlipidemia    Hypertension    Nephrolithiasis    Peripheral vascular disease ([place])    Sleep apnea    CPAP   no   Vertigo     Past Surgical History:  Procedure Laterality Date   CARDIAC CATHETERIZATION     2001   CARDIAC SURGERY     CAROTID ENDARTERECTOMY Right    cea  Dr. Amedeo Plenty   CARPAL TUNNEL RELEASE Right    CORONARY ARTERY BYPASS GRAFT     2001 (LIMA to LAD, SVG to obtuse marginal, sequential  SVG to RV , marginal branch in the distal right coronary artery.   knee arthoscopy     left carpal tunnel     ORIF SHOULDER FRACTURE Right 08/30/2013   Procedure: HEMI ARTHROPLASTY;  Surgeon: [name], MD;  Location: [facility];  Service: Orthopedics;  Laterality: Right;   right carotid endarterectomy     RIGHT/LEFT HEART CATH AND CORONARY/GRAFT ANGIOGRAPHY N/A 12/01/2020   Procedure: RIGHT/LEFT HEART CATH AND CORONARY/GRAFT ANGIOGRAPHY;  Surgeon: Burnell Blanks, MD;  Location: [facility];  Service:  Cardiovascular;  Laterality: N/A;   TONSILLECTOMY      There were no vitals filed for this visit.    Subjective Assessment - 12/19/20 1508     Subjective pt is a 87 yo F with CC of general fatigue, lightheadness, SOB that has been going on for 4-5 months which she notes is progressively worsening. She notes hx of low back and hip pain that is worse with standing/ walking which today is rated 4/10 today. she reports having to use a rollator more due to difficulties with balance. She reports having 1 fall in the last 6 months due to an accident golf cart bump which knocked her down    Patient Stated Goals to fix heart.                [facility] PT Assessment - 12/19/20 0001       Assessment   Medical Diagnosis Severe aortic stenosis    Referring Provider (PT) Sherren Mocha, MD    Hand Dominance Left      Precautions   Precautions None      Restrictions   Weight Bearing Restrictions No      Balance Screen   Has the patient fallen in the past 6 months Yes  How many times? 1    Has the patient had a decrease in activity level because of a fear of falling?  No    Is the patient reluctant to leave their home because of a fear of falling?  No      Home Ecologist residence    Living Arrangements Spouse/significant other    Available Help at Discharge Family    Type of [place] Access Level entry    [place] Other (comment)   rollat, ledge in shower   Additional Comments re      ROM / Strength   AROM / PROM / Strength AROM;Strength      AROM   Overall AROM  Deficits    Overall AROM Comments limited RUE AROM in all planes      Strength   Overall Strength Within functional limits for tasks performed    Overall Strength Comments L knee extension 4-/5 during testing all other MMT were FFL    Strength Assessment Site Hand    Right/Left hand Right;Left    Right Hand Grip (lbs) 20    Left Hand Grip  (lbs) 27      Ambulation/Gait   Assistive device Rollator    Gait Pattern Decreased stride length;Step-through pattern;Antalgic;Trendelenburg;Decreased trunk rotation;Trunk flexed    Gait Comments pt ambulated 110 ft in 2:00 requiring seated rest break lasting 1:55  HR 72 BPM and O2 72%. she resumed walking 110 ft in 1:45 requiring seated rest break for remainder of testing HR 75, O2 90%.              [facility] Pre-Surgical Assessment - 12/19/20 0001     5 Meter Walk Test- trial 1 11 sec    5 Meter Walk Test- trial 2 13 sec.     5 Meter Walk Test- trial 3 12 sec.    5 meter walk test average 12 sec    4 Stage Balance Test tolerated for:  5 sec.    4 Stage Balance Test Position 2    Sit To Stand Test- trial 1 --   unable to perform   ADL/IADL Independent with: Bathing;Dressing    ADL/IADL Needs Assistance with: Meal prep;Finances;Yard work    ADL/IADL Therapist, sports Index Moderately frail    6 Minute Walk- Baseline yes    BP (mmHg) 98/68    HR (bpm) 64    02 Sat (%RA) 94 %    Modified Borg Scale for Dyspnea 5- Strong or hard breathing    Perceived Rate of Exertion (Borg) 9- very light    6 Minute Walk Post Test yes    BP (mmHg) 121/79    HR (bpm) 72    02 Sat (%RA) 89 %    Modified Borg Scale for Dyspnea 7- Severe shortness of breath or very hard breathing    Perceived Rate of Exertion (Borg) 13- Somewhat hard    Aerobic Endurance Distance Walked 220    Endurance additional comments pt is 82.89% limited compared to age related norm                      Objective measurements completed on examination: See above findings.                            Plan - 12/19/20 1552     Clinical  Impression Statement see assessment in note    Stability/Clinical Decision Making Stable/Uncomplicated    Clinical Decision Making Low    PT Frequency One time visit    PT Next Visit Plan PRE TAVR evaluation    Consulted and Agree with Plan of Care Patient              Clinical Impression Statement: Pt is a 87 yo F presenting to OP PT for evaluation prior to possible TAVR surgery due to severe aortic stenosis. Pt reports onset of SOB, lightheadedness, general fatigeu  approximately 5 months ago. Symptoms are limiting standing/ walking for any period of time. Pt presents with functional ROM except for gross limitation with RUE and functional  strength except for weakness in the L quad, poor balance and is assessed as high at high fall risk 4 stage balance test, limited walking speed and limited aerobic endurance per 6 minute walk test. pt ambulated 110 ft in 2:00 requiring seated rest break lasting 1:55  HR 72 BPM and O2 72%. she resumed walking 110 ft in 1:45 requiring seated rest break for remainder of testing HR 75, O2 90%. on room air. Pt reported 7/10 shortness of breath on modified scale for dyspnea. Pt ambulated a total of 220 feet in 6 minute walk. SOB, general fatigue, L knee pain and low back pain  increased significantly with 6 minute walk test. Based on the Short Physical Performance Battery, patient has a frailty rating of 2/12 with </= 5/12 considered frail.    Patient demonstrated the following deficits and impairments:     Visit Diagnosis: Other abnormalities of gait and mobility     Problem List Patient Active Problem List   Diagnosis Date Noted   Severe aortic stenosis    Bilateral carotid artery stenosis 10/14/2018   Educated about COVID-19 virus infection 10/14/2018   Coronary artery disease involving native coronary artery of native heart without angina pectoris 10/15/2017   Nonrheumatic aortic valve stenosis 10/15/2017   TIA (transient ischemic attack) 12/13/2015   Stroke-like symptoms 12/13/2015   Gastroesophageal reflux disease without esophagitis    BPV (benign positional vertigo)    Bilateral sensorineural hearing loss 05/05/2015   Aftercare following surgery of the circulatory system, NEC 10/21/2013   Fracture of  proximal end of right humerus 08/30/2013   Occlusion and stenosis of carotid artery without mention of cerebral infarction 10/09/2011   Trigger index finger 07/24/2011   Allergic rhinitis 06/04/2011   Multilevel degenerative disc disease 06/04/2011   Personal history of coronary artery disease 06/04/2011   Vertigo 06/04/2011   Carpal tunnel syndrome 05/09/2011   Osteoarthritis of left knee 03/20/2011   Obesity 09/19/2010   Carotid stenosis 09/19/2010   ASTHMA 04/02/2010   RESTLESS LEG SYNDROME 01/31/2009   OBSTRUCTIVE SLEEP APNEA 01/03/2009   HYPERLIPIDEMIA 12/09/2008   Essential hypertension 12/09/2008   Coronary atherosclerosis 12/09/2008   Cough 12/09/2008   Starr Lake PT, DPT, LAT, ATC  12/19/20  3:53 PM      [facility] [address] [place], [place], [address] Phone: [phone]   Fax:  [phone]  Name: Lori Price MRN: [id number] Date of Birth: [birth year]

## 2020-12-25 ENCOUNTER — Encounter: Payer: Self-pay | Admitting: Physician Assistant

## 2020-12-25 ENCOUNTER — Ambulatory Visit: Payer: Medicare Other | Admitting: Cardiovascular Disease

## 2021-01-01 ENCOUNTER — Encounter: Payer: Medicare Other | Admitting: Surgery

## 2021-01-01 ENCOUNTER — Other Ambulatory Visit: Payer: Self-pay | Admitting: Cardiology

## 2021-01-02 ENCOUNTER — Encounter: Payer: Self-pay | Admitting: Surgery

## 2021-01-02 ENCOUNTER — Other Ambulatory Visit: Payer: Self-pay

## 2021-01-02 ENCOUNTER — Institutional Professional Consult (permissible substitution) (INDEPENDENT_AMBULATORY_CARE_PROVIDER_SITE_OTHER): Payer: Medicare Other | Admitting: Surgery

## 2021-01-02 VITALS — BP 114/77 | HR 72 | Ht 67.0 in

## 2021-01-02 DIAGNOSIS — I350 Nonrheumatic aortic (valve) stenosis: Secondary | ICD-10-CM | POA: Diagnosis not present

## 2021-01-02 DIAGNOSIS — I6529 Occlusion and stenosis of unspecified carotid artery: Secondary | ICD-10-CM | POA: Diagnosis not present

## 2021-01-02 NOTE — Progress Notes (Signed)
Patient ID: Lori Price, female   DOB: [birth year], 87 y.o.   MRN: [id number]  [facility]INIC        [address]       [place] [address]             [phone]          CARDIOTHORACIC SURGERY CONSULTATION REPORT  PCP is Glenda Chroman, MD Referring Provider is Dr. Sherren Mocha Primary Cardiologist is Minus Breeding, MD  Reason for consultation: Critical aortic stenosis  HPI:  The patient is an 87 year old woman with a history of hypertension, hyperlipidemia carotid artery disease status post right carotid endarterectomy in 2007, peripheral vascular disease, coronary artery disease status post coronary bypass graft surgery by Dr. Darcey Nora in 2001 after myocardial infarction, untreated sleep apnea, and aortic stenosis that has been followed by Dr. Percival Spanish for over a decade.  She had an echocardiogram in August 2021 showing moderate to severe aortic stenosis with a mean gradient of 37 mmHg and a valve area by VTI of 0.8 cm.  There is trivial aortic insufficiency.  Left ventricular systolic function was normal with severe LVH.  She was asymptomatic at that time and states she was feeling fine until April 2022 when she began developing progressive exertional fatigue and dyspnea as well as chest pressure.  She has had occasional dizziness but no syncope.  She has had peripheral edema.  She denies orthopnea and PND.  Her most recent echo on 11/09/2020 showed an increase in the mean gradient across aortic valve to 60 mmHg with a peak gradient of 109 mmHg.  Aortic valve area was 0.48 cm.  There was severe thickening and calcification of the aortic valve with restricted mobility.  Left ventricular ejection fraction was normal with severe concentric LVH.  There was moderate right ventricular systolic dysfunction.  The patient is here today with her husband.  They live at [employer].  They both use a walker to  get around due to gait instability.  She is still independent and takes care of her apartment.  She had a mechanical fall 3 to 4 years ago with a severe injury to her right humerus requiring surgical repair and has not regained full use of her arm since that.  Past Medical History:  Diagnosis Date   Arthritis    Asthma    BPV (benign positional vertigo)    Carotid artery disease ([place])    s/p R CEA (2007)   Carotid artery occlusion    Coronary artery disease    s/p MI 2001--(catheterizations in 2001 with left main 70% stenosis, LAD 70% followed by 90% stenosis, circumflex 99% stenosis, right coronary artery occluded.    Hyperlipidemia    Hypertension    Nephrolithiasis    Peripheral vascular disease ([place])    Sleep apnea    CPAP   no   Vertigo     Past Surgical History:  Procedure Laterality Date   CARDIAC CATHETERIZATION     2001   CARDIAC SURGERY     CAROTID ENDARTERECTOMY Right    cea  Dr. Amedeo Plenty   CARPAL TUNNEL RELEASE Right    CORONARY ARTERY BYPASS GRAFT     2001 (LIMA to LAD, SVG to obtuse marginal, sequential  SVG to RV , marginal branch in the distal right coronary artery.   knee arthoscopy     left carpal tunnel     ORIF SHOULDER FRACTURE  Right 08/30/2013   Procedure: HEMI ARTHROPLASTY;  Surgeon: [name], MD;  Location: [facility];  Service: Orthopedics;  Laterality: Right;   right carotid endarterectomy     RIGHT/LEFT HEART CATH AND CORONARY/GRAFT ANGIOGRAPHY N/A 12/01/2020   Procedure: RIGHT/LEFT HEART CATH AND CORONARY/GRAFT ANGIOGRAPHY;  Surgeon: Burnell Blanks, MD;  Location: [facility];  Service: Cardiovascular;  Laterality: N/A;   TONSILLECTOMY      Family History  Problem Relation Age of Onset   Ovarian cancer Mother    Cancer Mother        Ovarian   COPD Father    Aneurysm Father        Throat   Kidney disease Father    Heart disease Maternal Grandmother    Heart disease Maternal Grandfather     Social History   Socioeconomic  History   Marital status: Married    Spouse name: Not on file   Number of children: Not on file   Years of education: Not on file   Highest education level: Not on file  Occupational History   Not on file  Tobacco Use   Smoking status: Never   Smokeless tobacco: Never  Vaping Use   Vaping Use: Never used  Substance and Sexual Activity   Alcohol use: Not Currently   Drug use: Not Currently   Sexual activity: Not on file  Other Topics Concern   Not on file  Social History Narrative   ** Merged History Encounter **       Social Determinants of Health   Financial Resource Strain: Not on file  Food Insecurity: Not on file  Transportation Needs: Not on file  Physical Activity: Not on file  Stress: Not on file  Social Connections: Not on file  Intimate Partner Violence: Not on file    Prior to Admission medications   Medication Sig Start Date End Date Taking? Authorizing Provider  acetaminophen (TYLENOL) 500 MG tablet Take 1,000 mg by mouth every 6 (six) hours as needed for moderate pain or mild pain.   Yes [provider]  apixaban (ELIQUIS) 5 MG TABS tablet Take 1 tablet (5 mg total) by mouth 2 (two) times daily. 10/18/20  Yes Minus Breeding, MD  atenolol-chlorthalidone (TENORETIC) 100-25 MG tablet TAKE 1 TABLET ONCE DAILY. 01/01/21  Yes Minus Breeding, MD  atorvastatin (LIPITOR) 10 MG tablet Take 1 tablet (10 mg total) by mouth daily. 12/22/19  Yes Minus Breeding, MD  bisacodyl (DULCOLAX) 5 MG EC tablet Take 5-10 mg by mouth daily as needed for moderate constipation.   Yes [provider]  BREO ELLIPTA 100-25 MCG/INH AEPB Inhale 1 puff into the lungs daily. 11/07/20  Yes [provider]  Cyanocobalamin (B-12) 2500 MCG TABS Take 2,500 mcg by mouth 3 (three) times a week.   Yes [provider]  fexofenadine (ALLEGRA) 180 MG tablet Take 180 mg by mouth daily as needed for allergies.   Yes [provider]  fluconazole (DIFLUCAN) 100 MG  tablet Take 1 tablet (100 mg total) by mouth daily. 11/22/20  Yes Eileen Stanford, PA-C  furosemide (LASIX) 20 MG tablet TAKE (1/2) TO (1) TABLET DAILY AS NEEDED. Patient taking differently: Take 10 mg by mouth daily as needed for edema. 04/04/20  Yes Minus Breeding, MD  Glycerin-Hypromellose-PEG 400 (DRY EYE RELIEF DROPS) 0.2-0.2-1 % SOLN Place 1 drop into both eyes daily as needed (Dry eye).   Yes [provider]  meclizine (ANTIVERT) 25 MG tablet Take  25 mg by mouth daily as needed for dizziness.   Yes [provider]  Multiple Vitamin (MULTIVITAMIN) capsule Take 1 capsule by mouth daily. Woman   Yes [provider]  nystatin ointment (MYCOSTATIN) Apply 1 application topically 2 (two) times daily. 11/22/20  Yes Eileen Stanford, PA-C  POTASSIUM PO Take 1 tablet by mouth daily as needed (cramps).   Yes [provider]    Current Outpatient Medications  Medication Sig Dispense Refill   acetaminophen (TYLENOL) 500 MG tablet Take 1,000 mg by mouth every 6 (six) hours as needed for moderate pain or mild pain.     apixaban (ELIQUIS) 5 MG TABS tablet Take 1 tablet (5 mg total) by mouth 2 (two) times daily. 180 tablet 1   atenolol-chlorthalidone (TENORETIC) 100-25 MG tablet TAKE 1 TABLET ONCE DAILY. 90 tablet 2   atorvastatin (LIPITOR) 10 MG tablet Take 1 tablet (10 mg total) by mouth daily. 90 tablet 3   bisacodyl (DULCOLAX) 5 MG EC tablet Take 5-10 mg by mouth daily as needed for moderate constipation.     BREO ELLIPTA 100-25 MCG/INH AEPB Inhale 1 puff into the lungs daily.     Cyanocobalamin (B-12) 2500 MCG TABS Take 2,500 mcg by mouth 3 (three) times a week.     fexofenadine (ALLEGRA) 180 MG tablet Take 180 mg by mouth daily as needed for allergies.     fluconazole (DIFLUCAN) 100 MG tablet Take 1 tablet (100 mg total) by mouth daily. 10 tablet 0   furosemide (LASIX) 20 MG tablet TAKE (1/2) TO (1) TABLET DAILY AS NEEDED. (Patient taking differently: Take 10  mg by mouth daily as needed for edema.) 45 tablet 10   Glycerin-Hypromellose-PEG 400 (DRY EYE RELIEF DROPS) 0.2-0.2-1 % SOLN Place 1 drop into both eyes daily as needed (Dry eye).     meclizine (ANTIVERT) 25 MG tablet Take 25 mg by mouth daily as needed for dizziness.     Multiple Vitamin (MULTIVITAMIN) capsule Take 1 capsule by mouth daily. Woman     nystatin ointment (MYCOSTATIN) Apply 1 application topically 2 (two) times daily. 30 g 3   POTASSIUM PO Take 1 tablet by mouth daily as needed (cramps).     No current facility-administered medications for this visit.    Allergies  Allergen Reactions   Codeine Hives, Itching and Other (See Comments)    extreme agitation   Losartan Other (See Comments)    "I can't walk, I can't do anything"    Oxycodone Hives and Itching   Penicillins Swelling and Rash    Has patient had a PCN reaction causing immediate rash, facial/tongue/throat swelling, SOB or lightheadedness with hypotension: NO Has patient had a PCN reaction causing severe rash involving mucus membranes or skin necrosis: NO Has patient had a PCN reaction that required hospitalization: NO  Has patient had a PCN reaction occurring within the last 10 years: NO If all of the above answers are "NO", then may proceed with Cephalosporin use.     Pentazocine Other (See Comments) and Nausea And Vomiting   Quinapril Cough   Sibutramine Palpitations   Sulfamethoxazole Hives and Nausea And Vomiting    hives   Sulfonamide Derivatives Rash   Ace Inhibitors Cough   Ak-Mycin [Erythromycin] Other (See Comments)    unknown   Drug Ingredient [Modified Tree Tyrosine Adsorbate] Other (See Comments)    unknown   Eggs Or Egg-Derived Products Hives, Diarrhea and Other (See Comments)    Pt stated "My esophagus swells,  I get hives, diarrhea, and I pass out"      Review of Systems:   General:  normal appetite, + decreased energy, no weight gain, no weight loss, no fever  Cardiac:  + chest  tightness with exertion, no chest pain at rest, +SOB with mild exertion, no resting SOB, no PND, + orthopnea, no palpitations, + arrhythmia, + atrial fibrillation, + LE edema, + dizzy spells, no syncope  Respiratory:  + exertional shortness of breath, no home oxygen, no productive cough, + dry cough, no bronchitis, no wheezing, no hemoptysis, + asthma, no pain with inspiration or cough, + sleep apnea, no CPAP at night  GI:   no difficulty swallowing, no reflux, no frequent heartburn, no hiatal hernia, no abdominal pain, + constipation, no diarrhea, no hematochezia, no hematemesis, no melena  GU:   no dysuria,  no frequency, no urinary tract infection, no hematuria,  no kidney stones, no kidney disease  Vascular:  no pain suggestive of claudication, no pain in feet, +leg cramps, no varicose veins, no DVT, no non-healing foot ulcer  Neuro:   no stroke, + TIA's, no seizures, no headaches, no temporary blindness one eye,  no slurred speech, no peripheral neuropathy, no chronic pain, + instability of gait, no memory/cognitive dysfunction  Musculoskeletal: + arthritis, + joint swelling, + myalgias, + difficulty walking, + reduced mobility   Skin:   + rash, + itching, no skin infections, no pressure sores or ulcerations  Psych:   no anxiety, no depression, no nervousness, no unusual recent stress  Eyes:   no blurry vision, no floaters, no recent vision changes, + wears glasses   ENT:   + hearing loss, + loose or painful teeth, no dentures, last saw dentist 12/19/20 and had dental extraction 12/13/20.  Hematologic:  no easy bruising, no abnormal bleeding, no clotting disorder, no frequent epistaxis  Endocrine:  no diabetes, does not check CBG's at home     Physical Exam:   BP 114/77 (BP Location: Right Arm, Patient Position: Sitting)   Pulse 72   Ht '5\' 7"'$  (1.702 m)   SpO2 93% Comment: RA  BMI 36.02 kg/m   General:  Obese woman,  well-appearing  HEENT:  Unremarkable, NCAT, PERLA, EOMI  Neck:   no JVD,  no bruits, no adenopathy   Chest:   clear to auscultation, symmetrical breath sounds, no wheezes, no rhonchi   CV:   RRR, 3/6 systolic murmur RSB  Abdomen:  soft, non-tender, no masses   Extremities:  warm, well-perfused, pulses palpable at ankles, mild lower extremity edema bilaterally.  Rectal/GU  Deferred  Neuro:   Grossly non-focal and symmetrical throughout  Skin:   Clean and dry, no rashes, no breakdown  Diagnostic Tests:  ECHOCARDIOGRAM REPORT         Patient Name:   Lori Price   Date of Exam: 11/09/2020  Medical Rec #:  [id number]     Height:       67.0 in  Accession #:    [id number]    Weight:       232.6 lb  Date of Birth:  23-Jul-1931     BSA:          2.156 m  Patient Age:    32 years      BP:           121/79 mmHg  Patient Gender: F             HR:  79 bpm.  Exam Location:  [address]   Procedure: 2D Echo, Cardiac Doppler, Color Doppler and Intracardiac             Opacification Agent            REPORT CONTAINS CRITICAL RESULT     Critical aortic stenosis reported to Dr. Percival Spanish.   Indications:    I35.0 Aortic Stenosis     History:        Patient has prior history of Echocardiogram examinations,  most                  recent 01/04/2020. CAD, TIA and Carotid Disease; Risk                  Factors:Hypertension and HLD.     Sonographer:    Marygrace Drought RCS  Referring Phys: [place]     1. Left ventricular ejection fraction, by estimation, is 65 to 70%. The  left ventricle has normal function. The left ventricle has no regional  wall motion abnormalities. There is severe concentric left ventricular  hypertrophy. Left ventricular diastolic   function could not be evaluated. Elevated left ventricular end-diastolic  pressure.   2. Right ventricular systolic function is moderately reduced. The right  ventricular size is not well visualized. There is normal pulmonary artery  systolic pressure.   3. Left atrial size was  moderately dilated.   4. The mitral valve is abnormal. Trivial mitral valve regurgitation.  Moderate mitral annular calcification.   5. The aortic valve is calcified. There is severe calcifcation of the  aortic valve. There is severe thickening of the aortic valve. Aortic valve  regurgitation is trivial. Severe aortic valve stenosis. Aortic valve area,  by VTI measures 0.48 cm. Aortic  valve mean gradient measures 60.0 mmHg. Aortic valve Vmax measures 5.22  m/s.   6. The inferior vena cava is normal in size with greater than 50%  respiratory variability, suggesting right atrial pressure of 3 mmHg.   Comparison(s): Changes from prior study are noted. Measurements now  consistent with critical aortic stenosis, findings communicated to Dr.  Percival Spanish.   FINDINGS   Left Ventricle: Left ventricular ejection fraction, by estimation, is 65  to 70%. The left ventricle has normal function. The left ventricle has no  regional wall motion abnormalities. Definity contrast agent was given IV  to delineate the left ventricular   endocardial borders. The left ventricular internal cavity size was normal  in size. There is severe concentric left ventricular hypertrophy. Left  ventricular diastolic function could not be evaluated due to atrial  fibrillation. Left ventricular diastolic   function could not be evaluated. Elevated left ventricular end-diastolic  pressure.   Right Ventricle: The right ventricular size is not well visualized. Right  vetricular wall thickness was not well visualized. Right ventricular  systolic function is moderately reduced. There is normal pulmonary artery  systolic pressure. The tricuspid  regurgitant velocity is 2.10 m/s, and with an assumed right atrial  pressure of 3 mmHg, the estimated right ventricular systolic pressure is  123XX123 mmHg.   Left Atrium: Left atrial size was moderately dilated.   Right Atrium: Right atrial size was normal in size.   Pericardium:  There is no evidence of pericardial effusion.   Mitral Valve: The mitral valve is abnormal. There is mild thickening of  the mitral valve leaflet(s). There is moderate calcification of the mitral  valve leaflet(s). Moderate mitral annular calcification.  Trivial mitral  valve regurgitation. MV peak  gradient, 10.4 mmHg. The mean mitral valve gradient is 3.0 mmHg.   Tricuspid Valve: The tricuspid valve is normal in structure. Tricuspid  valve regurgitation is trivial. No evidence of tricuspid stenosis.   Aortic Valve: The aortic valve is calcified. There is severe calcifcation  of the aortic valve. There is severe thickening of the aortic valve.  Aortic valve regurgitation is trivial. Severe aortic stenosis is present.  Aortic valve mean gradient measures  60.0 mmHg. Aortic valve peak gradient measures 109.0 mmHg. Aortic valve  area, by VTI measures 0.48 cm.   Pulmonic Valve: The pulmonic valve was not well visualized. Pulmonic valve  regurgitation is trivial. No evidence of pulmonic stenosis.   Aorta: The aortic root and ascending aorta are structurally normal, with  no evidence of dilitation.   Venous: The inferior vena cava is normal in size with greater than 50%  respiratory variability, suggesting right atrial pressure of 3 mmHg.   IAS/Shunts: The atrial septum is grossly normal.      LEFT VENTRICLE  PLAX 2D  LVIDd:         3.90 cm  Diastology  LVIDs:         2.40 cm  LV e' medial:    5.44 cm/s  LV PW:         1.70 cm  LV E/e' medial:  24.3  LV IVS:        1.60 cm  LV e' lateral:   7.94 cm/s  LVOT diam:     2.00 cm  LV E/e' lateral: 16.6  LV SV:         64  LV SV Index:   30  LVOT Area:     3.14 cm      RIGHT VENTRICLE  RV S prime:     7.72 cm/s  TAPSE (M-mode): 1.2 cm  RVSP:           20.6 mmHg   LEFT ATRIUM             Index       RIGHT ATRIUM           Index  LA diam:        4.20 cm 1.95 cm/m  RA Pressure: 3.00 mmHg  LA Vol (A2C):   76.5 ml 35.48 ml/m RA  Area:     17.20 cm  LA Vol (A4C):   68.5 ml 31.77 ml/m RA Volume:   43.00 ml  19.94 ml/m  LA Biplane Vol: 74.3 ml 34.46 ml/m   AORTIC VALVE  AV Area (Vmax):    0.45 cm  AV Area (Vmean):   0.49 cm  AV Area (VTI):     0.48 cm  AV Vmax:           522.00 cm/s  AV Vmean:          364.000 cm/s  AV VTI:            1.340 m  AV Peak Grad:      109.0 mmHg  AV Mean Grad:      60.0 mmHg  LVOT Vmax:         75.00 cm/s  LVOT Vmean:        56.500 cm/s  LVOT VTI:          0.203 m  LVOT/AV VTI ratio: 0.15     AORTA  Ao Root diam: 2.90 cm  Ao Asc diam:  3.30 cm  MITRAL VALVE                TRICUSPID VALVE  MV Area (PHT): 2.11 cm     TR Peak grad:   17.6 mmHg  MV Area VTI:   1.61 cm     TR Vmax:        210.00 cm/s  MV Peak grad:  10.4 mmHg    Estimated RAP:  3.00 mmHg  MV Mean grad:  3.0 mmHg     RVSP:           20.6 mmHg  MV Vmax:       1.61 m/s  MV Vmean:      81.3 cm/s    SHUNTS  MV Decel Time: 359 msec     Systemic VTI:  0.20 m  MV E velocity: 132.00 cm/s  Systemic Diam: 2.00 cm   Buford Dresser MD  Electronically signed by Buford Dresser MD  Signature Date/Time: 11/10/2020/11:23:04 AM         Final     Physicians  Panel Physicians Referring Physician Case Authorizing Physician  Burnell Blanks, MD (Primary)     Procedures  RIGHT/LEFT HEART CATH AND CORONARY/GRAFT ANGIOGRAPHY   Conclusion      Prox RCA to Mid RCA lesion is 100% stenosed.   Origin to Prox Graft lesion before Acute Mrg  is 100% stenosed.   Origin to Prox Graft lesion is 100% stenosed.   Ost Cx to Prox Cx lesion is 70% stenosed.   Mid LAD lesion is 100% stenosed.   SVG graft was visualized by angiography.   SVG graft was visualized by angiography.   LIMA graft was visualized by angiography.   Severe three vessel CAD s/p 4V CABG with 1/4 patent bypass grafts Chronic total occlusion of the mid LAD. The mid and distal LAD fills from the patent LIMA graft.  Moderately severe  proximal Circumflex stenosis. The vein graft to the Circumflex system is chronically occluded Chronic occlusion of the proximal RCA. The sequential vein graft to the RV marginal branch and PDA is chronically occluded. The distal RCA and branches fill from right to right and left to right collaterals.  Severe aortic stenosis (mean gradient 49.4 mmHg, Peak to peak gradient 58 mmHg, AVA 0.74 cm2)   Recommendations: I would favor medical management of her CAD. The proximal Circumflex stenoses are moderately severe however this is not critical. Will continue workup for TAVR.    Indications  Severe aortic stenosis [I35.0 (ICD-10-CM)]   Procedural Details  Technical Details Indication: Severe aortic stenosis. History of CAD with prior CABG. TAVR workup in progress.   Procedure: The risks, benefits, complications, treatment options, and expected outcomes were discussed with the patient. The patient and/or family concurred with the proposed plan, giving informed consent. The patient was brought to the cath lab after IV hydration was given. The patient was sedated with Versed and Fentanyl. The IV catheter present in the right antecubital vein was changed for a 5 [place] sheath. Right heart catheterization performed with a balloon tipped catheter. The left wrist was prepped and draped in a sterile fashion. 1% lidocaine was used for local anesthesia. Using the modified Seldinger access technique, a 5 French sheath was placed in the left radial artery. 3 mg Verapamil was given through the sheath. 6000 units IV heparin was given. Standard diagnostic catheters were used to perform selective coronary angiography. All bypass grafts engaged with the JR4 catheter. The aortic valve was crossed with an AL-1  and  a straight wire. The sheath was removed from the left radial artery and a Terumo hemostasis band was applied at the arteriotomy site on the left wrist.      Estimated blood loss <50 mL.   During this  procedure medications were administered to achieve and maintain moderate conscious sedation while the patient's heart rate, blood pressure, and oxygen saturation were continuously monitored and I was present face-to-face 100% of this time.   Medications (Filter: Administrations occurring from [phone] to 1048 on 12/01/20)  important  Continuous medications are totaled by the amount administered until 12/01/20 1048.   Heparin (Porcine) in NaCl 1000-0.9 UT/500ML-% SOLN (mL) Total volume:  500 mL Date/Time Rate/Dose/Volume Action   12/01/20 0958 500 mL Given    Heparin (Porcine) in NaCl 1000-0.9 UT/500ML-% SOLN (mL) Total volume:  500 mL Date/Time Rate/Dose/Volume Action   12/01/20 0959 500 mL Given    fentaNYL (SUBLIMAZE) injection (mcg) Total dose:  25 mcg Date/Time Rate/Dose/Volume Action   12/01/20 1009 25 mcg Given    midazolam (VERSED) injection (mg) Total dose:  1 mg Date/Time Rate/Dose/Volume Action   12/01/20 1010 1 mg Given    lidocaine (PF) (XYLOCAINE) 1 % injection (mL) Total volume:  3 mL Date/Time Rate/Dose/Volume Action   12/01/20 1013 1 mL Given   1018 2 mL Given    Radial Cocktail/Verapamil only (mL) Total volume:  10 mL Date/Time Rate/Dose/Volume Action   12/01/20 1023 10 mL Given    heparin sodium (porcine) injection (Units) Total dose:  6,000 Units Date/Time Rate/Dose/Volume Action   12/01/20 1025 6,000 Units Given    iohexol (OMNIPAQUE) 350 MG/ML injection (mL) Total volume:  65 mL Date/Time Rate/Dose/Volume Action   12/01/20 1043 65 mL Given    Sedation Time  Sedation Time Physician-1: 31 minutes 7 seconds Contrast  Medication Name Total Dose  iohexol (OMNIPAQUE) 350 MG/ML injection 65 mL   Radiation/Fluoro  Fluoro time: 8.5 (min) DAP: 31393 (mGycm2) Cumulative Air Kerma: 570 (mGy) Coronary Findings  Diagnostic Dominance: Right Left Anterior Descending  Vessel is large.  Mid LAD lesion is 100% stenosed. The lesion is chronically  occluded.  Left Circumflex  Vessel is large.  Ost Cx to Prox Cx lesion is 70% stenosed.  Second Obtuse Marginal Branch  Vessel is large in size.  Right Coronary Artery  Prox RCA to Mid RCA lesion is 100% stenosed. The lesion is chronically occluded.  Third Right Posterolateral Branch  Collaterals  3rd RPL filled by collaterals from 1st Sept.    Sequential Saphenous Graft To Acute Mrg, RPDA  SVG graft was visualized by angiography.  Origin to Prox Graft lesion before Acute Mrg is 100% stenosed. The lesion is chronically occluded.  Saphenous Graft To 2nd Mrg  SVG graft was visualized by angiography.  Origin to Prox Graft lesion is 100% stenosed. The lesion is chronically occluded.  LIMA LIMA Graft To Dist LAD  LIMA graft was visualized by angiography.  Intervention  No interventions have been documented. Coronary Diagrams  Diagnostic Dominance: Right Intervention  Implants     No implant documentation for this case.   Syngo Images   Show images for CARDIAC CATHETERIZATION Images on Long Term Storage   Show images for [place], [place] to Procedure Log  Procedure Log    Hemo Data  Flowsheet Row Most Recent Value  Fick Cardiac Output 5.24 L/min  Fick Cardiac Output Index 2.41 (L/min)/BSA  Aortic Mean Gradient 49.42 mmHg  Aortic Peak Gradient 58 mmHg  Aortic Valve Area 0.74  Aortic Value Area Index 0.34 cm2/BSA  RA A Wave 7 mmHg  RA V Wave 11 mmHg  RA Mean 9 mmHg  RV Systolic Pressure 42 mmHg  RV Diastolic Pressure 5 mmHg  RV EDP 9 mmHg  PA Systolic Pressure 38 mmHg  PA Diastolic Pressure 14 mmHg  PA Mean 27 mmHg  PW A Wave 19 mmHg  PW V Wave 26 mmHg  PW Mean 18 mmHg  AO Systolic Pressure 99991111 mmHg  AO Diastolic Pressure 61 mmHg  AO Mean 82 mmHg  LV Systolic Pressure [id number] mmHg  LV Diastolic Pressure 10 mmHg  LV EDP 12 mmHg  AOp Systolic Pressure [id number] mmHg  AOp Diastolic Pressure 58 mmHg  AOp Mean Pressure 75 mmHg  LVp Systolic Pressure 123XX123 mmHg  LVp  Diastolic Pressure 10 mmHg  LVp EDP Pressure 15 mmHg  QP/QS 1  TPVR Index 11.18 HRUI  TSVR Index 33.94 HRUI  PVR SVR Ratio 0.12  TPVR/TSVR Ratio 0.33    ADDENDUM REPORT: 12/08/2020 13:37   CLINICAL DATA:  Severe Aortic Stenosis.   EXAM: Cardiac TAVR CT   TECHNIQUE: A non-contrast, gated CT scan was obtained with axial slices of 3 mm through the heart for aortic valve calcium scoring. A 120 kV retrospective, gated, contrast cardiac scan was obtained. Gantry rotation speed was 250 msecs and collimation was 0.6 mm. Nitroglycerin was not given. The 3D data set was reconstructed in 5% intervals of the 0-95% of the R-R cycle. Systolic and diastolic phases were analyzed on a dedicated workstation using MPR, MIP, and VRT modes. The patient received 100 cc of contrast.   FINDINGS: Image quality: Excellent.   Noise artifact is: Limited.   Valve Morphology: The aortic valve is tricuspid with severe diffuse calcifications. The leaflets demonstrate severely restricted movement in systole.   Aortic Valve Calcium score: 1200   Aortic annular dimension:   Phase assessed: 250 ms   Annular area: 332 mm2   Annular perimeter: 66.3 mm   Max diameter: 24.0 mm   Min diameter: 18.6 mm   Annular and subannular calcification: None.   Optimal coplanar projection: LAO 33 CAU 8   Coronary Artery Height above Annulus:   Left Main: 11.0 mm   Right Coronary: 18.1 mm   Sinus of Valsalva Measurements:   Non-coronary: 27 mm   Right-coronary: 28 mm   Left-coronary: 31 mm   Average Diameter: 29 mm   Sinus of Valsalva Height:   Non-coronary: 15.9 mm   Right-coronary: 21.3 mm   Left-coronary: 17.1 mm   Sinotubular Junction: 27 mm.  Mild calcification.   Ascending Thoracic Aorta: 31 mm   Coronary Arteries: Normal coronary origin. Right dominance. The study was performed without use of NTG and is insufficient for plaque evaluation. Please refer to recent cardiac  catheterization for coronary assessment. Patent LIMA-LAD. Occluded SVG-RCA and likely OM noted. Severe 3-vessel CAD.   Cardiac Morphology:   Right Atrium: Right atrial size is within normal limits.   Right Ventricle: The right ventricular cavity is within normal limits.   Left Atrium: Left atrial size is normal in size. There is a filling defect in the left atrial appendage likely consistent with contrast mixing artifact.   Left Ventricle: The ventricular cavity size is within normal limits. There are no stigmata of prior infarction. There is no abnormal filling defect. Normal LVEF, 64%. Postoperative septal movement.   Pulmonary arteries: Normal in size without proximal filling defect.   Pulmonary veins: Normal  pulmonary venous drainage.   Pericardium: Normal thickness with no significant effusion or calcium present.   Mitral Valve: The mitral valve is normal structure with moderate mitral annular calcification.   Extra-cardiac findings: See attached radiology report for non-cardiac structures.   IMPRESSION: 1. Tricuspid aortic valve with severe aortic stenosis.   2. Small annular dimensions (332 mm2) but anatomy favorable for 26 mm Evolut Pro.   3. No significant annular or subannular calcifications.   4. Sufficient coronary to annulus distance.   5. Optimal Fluoroscopic Angle for Delivery: LAO 33 CAU 8   6. There is a filling defect in the left atrial appendage likely consistent with contrast mixing artifact.   7. Moderate mitral annular calcification.   [place] T. Audie Box, MD     Electronically Signed   By: Eleonore Chiquito   On: 12/08/2020 13:37    Narrative & Impression  CLINICAL DATA:  87 year old female with history of severe aortic stenosis. Preprocedural study prior to potential transcatheter aortic valve replacement (TAVR) procedure.   EXAM: CT ANGIOGRAPHY CHEST, ABDOMEN AND PELVIS   TECHNIQUE: Multidetector CT imaging through the chest,  abdomen and pelvis was performed using the standard protocol during bolus administration of intravenous contrast. Multiplanar reconstructed images and MIPs were obtained and reviewed to evaluate the vascular anatomy.   CONTRAST:  145m OMNIPAQUE IOHEXOL 350 MG/ML SOLN   COMPARISON:  No priors.   FINDINGS: CTA CHEST FINDINGS   Cardiovascular: Heart size is moderately enlarged. There is no significant pericardial fluid, thickening or pericardial calcification. There is aortic atherosclerosis, as well as atherosclerosis of the great vessels of the mediastinum and the coronary arteries, including calcified atherosclerotic plaque in the left anterior descending, left circumflex and right coronary arteries. Status post median sternotomy for CABG including LIMA to the LAD. Thickening calcification of the aortic valve. Calcifications of the mitral valve and annulus.   Mediastinum/Lymph Nodes: No pathologically enlarged mediastinal or hilar lymph nodes. Esophagus is unremarkable in appearance. No axillary lymphadenopathy.   Lungs/Pleura: In the left lower lobe (axial image 61 of series 4) there is a 1.8 x 1.7 cm pulmonary nodule which has a central core of dense calcification, likely to represent a partially calcified granuloma or hamartoma. No other definite suspicious appearing pulmonary nodules or masses are noted. No acute consolidative airspace disease. No pleural effusions. Dependent areas of subsegmental atelectasis are noted in the lower lobes of the lungs bilaterally.   Musculoskeletal/Soft Tissues: There are no aggressive appearing lytic or blastic lesions noted in the visualized portions of the skeleton. Median sternotomy wires. Status post right shoulder arthroplasty.   CTA ABDOMEN AND PELVIS FINDINGS   Hepatobiliary: Subcentimeter low-attenuation lesion between segments 2 and 3 in the liver, too small to characterize, but statistically likely to represent a tiny cyst.  No other suspicious hepatic lesions. No intra or extrahepatic biliary ductal dilatation. Numerous peripherally calcified gallstones are noted in the gallbladder measuring up to 2.1 cm in diameter. Gallbladder is only moderately distended. No gallbladder wall thickening or surrounding inflammatory changes are noted.   Pancreas: No pancreatic mass. No pancreatic ductal dilatation. No pancreatic or peripancreatic fluid collections or inflammatory changes.   Spleen: Unremarkable.   Adrenals/Urinary Tract: Bilateral kidneys and adrenal glands are normal in appearance. No hydroureteronephrosis. Urinary bladder is normal in appearance.   Stomach/Bowel: The appearance of the stomach is normal. No pathologic dilatation of small bowel or colon. Numerous colonic diverticulae are noted, without surrounding inflammatory changes to suggest an acute diverticulitis at this time.  Normal appendix.   Vascular/Lymphatic: Aortic atherosclerosis, without evidence of aneurysm or dissection in the abdominal or pelvic vasculature. No lymphadenopathy noted in the abdomen or pelvis.   Reproductive: Uterus and ovaries are unremarkable in appearance.   Other: No significant volume of ascites.  No pneumoperitoneum.   Musculoskeletal: There are no aggressive appearing lytic or blastic lesions noted in the visualized portions of the skeleton.   VASCULAR MEASUREMENTS PERTINENT TO TAVR:   AORTA:   Minimal Aortic Diameter-15 x 13 mm   Severity of Aortic Calcification-moderate   RIGHT PELVIS:   Right Common Iliac Artery -   Minimal Diameter-8.9 x 8.6 mm   Tortuosity-moderate to severe   Calcification-mild   Right External Iliac Artery -   Minimal Diameter-7.4 x 7.8 mm   Tortuosity-severe   Calcification-none   Right Common Femoral Artery -   Minimal Diameter-7.9 x 6.7 mm   Tortuosity-mild   Calcification-mild   LEFT PELVIS:   Left Common Iliac Artery -   Minimal Diameter-6.3 x 5.6  mm   Tortuosity-severe   Calcification-moderate   Left External Iliac Artery -   Minimal Diameter-7.1 x 6.9 mm   Tortuosity-moderate   Calcification-none   Left Common Femoral Artery -   Minimal Diameter-7.7 x 6.4 mm   Tortuosity-mild   Calcification-mild   Review of the MIP images confirms the above findings.   IMPRESSION: 1. Vascular findings and measurements pertinent to potential TAVR procedure, as detailed above. 2. Thickening calcification of the aortic valve, compatible with reported clinical history of severe aortic stenosis. 3. Aortic atherosclerosis, in addition to 3 vessel coronary artery disease. Status post median sternotomy for CABG including LIMA to the LAD. 4. Moderate cardiomegaly. 5. Cholelithiasis without evidence of acute cholecystitis at this time. 6. Colonic diverticulosis without evidence of acute diverticulitis at this time. 7. Additional incidental findings, as above.     Electronically Signed   By: Vinnie Langton M.D.   On: 12/07/2020 14:17      STS RISK CALCULATOR:  Isolated AVR: Risk of Mortality: 6.064% Renal Failure: 3.257% Permanent Stroke: 1.757% Prolonged Ventilation: 13.715% DSW Infection: 0.398% Reoperation: 2.891% Morbidity or Mortality: 16.850% Short Length of Stay: 13.045% Long Length of Stay: 11.135%  Impression:  This 87 year old woman has stage D, severe, symptomatic aortic stenosis with New York Heart Association class III symptoms of exertional fatigue and shortness of breath with minimal activity consistent with chronic diastolic congestive heart failure.  She has also had exertional chest pressure, dizziness, and orthopnea.  I have personally reviewed her 2D echocardiogram, cardiac catheterization, and CTA studies.  Her echocardiogram shows severe aortic stenosis with a mean gradient of 60 mmHg and an aortic valve area of 0.48 cm.  Left ventricular systolic function is normal with severe concentric  LVH and diastolic dysfunction.  Cardiac catheterization shows severe native three-vessel coronary disease with a patent left internal mammary graft to the LAD which is chronically occluded in the midportion.  The previously placed vein grafts are occluded as is the proximal RCA.  It fills distally from collaterals.  There is 70% ostial to proximal left circumflex stenosis.  I agree that aortic valve replacement is indicated in this patient for relief of her symptoms and to prevent progressive left ventricular deterioration.  I think her coronary disease can be treated medically.  I do not think she is a candidate for open surgical aortic valve replacement and coronary bypass surgery due to her advanced age, previous coronary bypass surgery, and multiple comorbidities with limited  mobility.  I think transcatheter aortic valve replacement is a reasonable alternative.  Her gated cardiac CTA shows anatomy suitable for TAVR although her annular area is relatively small and would only be suitable for a 20 mm Edwards SAPIEN valve.  Her dimensions appear suitable for a 26 mm Medtronic Evolut Pro+ valve which would be a better choice given her BSA of 2.22.  Her abdominal and pelvic CTA shows adequate pelvic vascular anatomy to allow transfemoral insertion  The patient and her husband were counseled at length regarding treatment alternatives for management of severe symptomatic aortic stenosis. The risks and benefits of surgical intervention has been discussed in detail. Long-term prognosis with medical therapy was discussed. Alternative approaches such as conventional surgical aortic valve replacement, transcatheter aortic valve replacement, and palliative medical therapy were compared and contrasted at length. This discussion was placed in the context of the patient's own specific clinical presentation and past medical history. All of their questions have been addressed.   Following the decision to proceed with  transcatheter aortic valve replacement, a discussion was held regarding what types of management strategies would be attempted intraoperatively in the event of life-threatening complications, including whether or not the patient would be considered a candidate for the use of cardiopulmonary bypass and/or conversion to open sternotomy for attempted surgical intervention.  I do not think she is a candidate for emergent sternotomy to manage any intraoperative complications.  The patient is aware of the fact that transient use of cardiopulmonary bypass may be necessary. The patient has been advised of a variety of complications that might develop including but not limited to risks of death, stroke, paravalvular leak, aortic dissection or other major vascular complications, aortic annulus rupture, device embolization, cardiac rupture or perforation, mitral regurgitation, acute myocardial infarction, arrhythmia, heart block or bradycardia requiring permanent pacemaker placement, congestive heart failure, respiratory failure, renal failure, pneumonia, infection, other late complications related to structural valve deterioration or migration, or other complications that might ultimately cause a temporary or permanent loss of functional independence or other long term morbidity. The patient provides full informed consent for the procedure as described and all questions were answered.      Plan:  She will be scheduled for transfemoral transcatheter aortic valve replacement using a Medtronic Evolut valve on 01/09/2021.  I spent 60 minutes performing this consultation and > 50% of this time was spent face to face counseling and coordinating the care of this patient's severe symptomatic aortic stenosis.   Gaye Pollack, MD 01/02/2021 5:07 PM

## 2021-01-03 ENCOUNTER — Encounter: Payer: Medicare Other | Admitting: Surgery

## 2021-01-03 ENCOUNTER — Encounter: Payer: Self-pay | Admitting: Surgery

## 2021-01-04 ENCOUNTER — Other Ambulatory Visit: Payer: Self-pay

## 2021-01-04 ENCOUNTER — Encounter: Payer: Self-pay | Admitting: Physician Assistant

## 2021-01-04 ENCOUNTER — Other Ambulatory Visit: Payer: Self-pay | Admitting: Physician Assistant

## 2021-01-04 ENCOUNTER — Ambulatory Visit (INDEPENDENT_AMBULATORY_CARE_PROVIDER_SITE_OTHER): Payer: Medicare Other | Admitting: Physician Assistant

## 2021-01-04 VITALS — BP 96/60 | HR 62 | Ht 67.0 in | Wt 234.0 lb

## 2021-01-04 DIAGNOSIS — I350 Nonrheumatic aortic (valve) stenosis: Secondary | ICD-10-CM

## 2021-01-04 DIAGNOSIS — B372 Candidiasis of skin and nail: Secondary | ICD-10-CM | POA: Diagnosis not present

## 2021-01-04 DIAGNOSIS — I6529 Occlusion and stenosis of unspecified carotid artery: Secondary | ICD-10-CM | POA: Diagnosis not present

## 2021-01-04 MED ORDER — FLUCONAZOLE 100 MG PO TABS: 200.0000 mg | ORAL_TABLET | Freq: Every day | ORAL | 0 refills | Status: DC

## 2021-01-04 MED ORDER — NYSTATIN 100000 UNIT/GM EX OINT: 1.0000 "application " | TOPICAL_OINTMENT | Freq: Two times a day (BID) | CUTANEOUS | 3 refills | Status: DC

## 2021-01-04 MED ORDER — FLUCONAZOLE 100 MG PO TABS: 100.0000 mg | ORAL_TABLET | Freq: Every day | ORAL | 0 refills | Status: DC

## 2021-01-04 NOTE — Progress Notes (Signed)
[facility] [place]                                     Cardiology Office Note:    Date:  01/04/2021   ID:  SHAHNAZ KLASS, DOB 1931/08/15, MRN [id number]  PCP:  Glenda Chroman, MD  [facility] Cardiologist:  Minus Breeding, MD  [place] Electrophysiologist:  None   Referring MD: Glenda Chroman, MD   Groin yeast infection - evaluation prior to TAVR 8/23  History of Present Illness:    Lori Price is a 87 y.o. female with a hx of hypertension, hyperlipidemia carotid artery disease status post right carotid endarterectomy in 2007, peripheral vascular disease, coronary artery disease status post coronary bypass graft surgery by Dr. Darcey Nora in 2001 after myocardial infarction, untreated sleep apnea, and aortic stenosis with plans for TAVR 01/09/21.   The patient has been evaluated by the multidisciplinary valve team and felt to have severe, symptomatic aortic stenosis and to be a suitable candidate for TAVR, which was set up for 01/09/21.   She has had ongoing issues with yeast candidal infection which has not cleared per patient report. She presents today for groin evaluation. She is doing okay. Still very short winded. Anxious to get valved fixed. Did have an episode of dizziness in the office visit when transferring from exam table to walker.    Past Medical History:  Diagnosis Date   Arthritis    Asthma    BPV (benign positional vertigo)    Carotid artery disease ([place])    s/p R CEA (2007)   Carotid artery occlusion    Coronary artery disease    s/p MI 2001--(catheterizations in 2001 with left main 70% stenosis, LAD 70% followed by 90% stenosis, circumflex 99% stenosis, right coronary artery occluded.    Hyperlipidemia    Hypertension    Nephrolithiasis    Peripheral vascular disease ([place])    Sleep apnea    CPAP   no   Vertigo     Past Surgical History:  Procedure Laterality Date   CARDIAC CATHETERIZATION     2001    CARDIAC SURGERY     CAROTID ENDARTERECTOMY Right    cea  Dr. Amedeo Plenty   CARPAL TUNNEL RELEASE Right    CORONARY ARTERY BYPASS GRAFT     2001 (LIMA to LAD, SVG to obtuse marginal, sequential  SVG to RV , marginal branch in the distal right coronary artery.   knee arthoscopy     left carpal tunnel     ORIF SHOULDER FRACTURE Right 08/30/2013   Procedure: HEMI ARTHROPLASTY;  Surgeon: [name], MD;  Location: [facility];  Service: Orthopedics;  Laterality: Right;   right carotid endarterectomy     RIGHT/LEFT HEART CATH AND CORONARY/GRAFT ANGIOGRAPHY N/A 12/01/2020   Procedure: RIGHT/LEFT HEART CATH AND CORONARY/GRAFT ANGIOGRAPHY;  Surgeon: Burnell Blanks, MD;  Location: [place] [facility];  Service: Cardiovascular;  Laterality: N/A;   TONSILLECTOMY      Current Medications: Current Meds  Medication Sig   acetaminophen (TYLENOL) 500 MG tablet Take 1,000 mg by mouth every 6 (six) hours as needed for moderate pain or mild pain.   apixaban (ELIQUIS) 5 MG TABS tablet Take 1 tablet (5 mg total) by mouth 2 (two) times daily.   atenolol-chlorthalidone (TENORETIC) 100-25 MG tablet TAKE 1 TABLET ONCE DAILY.  atorvastatin (LIPITOR) 10 MG tablet Take 1 tablet (10 mg total) by mouth daily.   bisacodyl (DULCOLAX) 5 MG EC tablet Take 5-10 mg by mouth daily as needed for moderate constipation.   BREO ELLIPTA 100-25 MCG/INH AEPB Inhale 1 puff into the lungs daily.   Cyanocobalamin (B-12) 2500 MCG TABS Take 2,500 mcg by mouth 3 (three) times a week.   fexofenadine (ALLEGRA) 180 MG tablet Take 180 mg by mouth daily as needed for allergies.   furosemide (LASIX) 20 MG tablet TAKE (1/2) TO (1) TABLET DAILY AS NEEDED. (Patient taking differently: Take 10 mg by mouth daily as needed for edema.)   Glycerin-Hypromellose-PEG 400 (DRY EYE RELIEF DROPS) 0.2-0.2-1 % SOLN Place 1 drop into both eyes daily as needed (Dry eye).   meclizine (ANTIVERT) 25 MG tablet Take 25 mg by mouth daily as needed for dizziness.    Multiple Vitamin (MULTIVITAMIN) capsule Take 1 capsule by mouth daily. Woman   nystatin ointment (MYCOSTATIN) Apply 1 application topically 2 (two) times daily.   POTASSIUM PO Take 1 tablet by mouth daily as needed (cramps).   [DISCONTINUED] fluconazole (DIFLUCAN) 100 MG tablet Take 1 tablet (100 mg total) by mouth daily.     Allergies:   Codeine, Losartan, Oxycodone, Penicillins, Pentazocine, Quinapril, Sibutramine, Sulfamethoxazole, Sulfonamide derivatives, Ace inhibitors, Ak-mycin [erythromycin], Drug ingredient [modified tree tyrosine adsorbate], and Eggs or egg-derived products   Social History   Socioeconomic History   Marital status: Married    Spouse name: Not on file   Number of children: Not on file   Years of education: Not on file   Highest education level: Not on file  Occupational History   Not on file  Tobacco Use   Smoking status: Never   Smokeless tobacco: Never  Vaping Use   Vaping Use: Never used  Substance and Sexual Activity   Alcohol use: Not Currently   Drug use: Not Currently   Sexual activity: Not on file  Other Topics Concern   Not on file  Social History Narrative   ** Merged History Encounter **       Social Determinants of Health   Financial Resource Strain: Not on file  Food Insecurity: Not on file  Transportation Needs: Not on file  Physical Activity: Not on file  Stress: Not on file  Social Connections: Not on file     Family History: The patient's family history includes Aneurysm in her father; COPD in her father; Cancer in her mother; Heart disease in her maternal grandfather and maternal grandmother; Kidney disease in her father; Ovarian cancer in her mother.  ROS:   Please see the history of present illness.    All other systems reviewed and are negative.  EKGs/Labs/Other Studies Reviewed:    The following studies were reviewed today: none  EKG:  EKG is NOT ordered today.  Recent Labs: 09/29/2020: NT-Pro BNP 1,888 11/22/2020:  BUN 25; Creatinine, Ser 1.12; Platelets 242 12/01/2020: Hemoglobin 13.9; Potassium 3.5; Sodium 142  Recent Lipid Panel    Component Value Date/Time   CHOL 153 02/08/2016 0852   TRIG 213 (H) 02/08/2016 0852   HDL 39 (L) 02/08/2016 0852   CHOLHDL 3.9 02/08/2016 0852   CHOLHDL 7.8 12/14/2015 0542   VLDL UNABLE TO CALCULATE IF TRIGLYCERIDE OVER 400 mg/dL 12/14/2015 0542   LDLCALC 71 02/08/2016 0852   LDLDIRECT 111.8 02/03/2014 0924     Risk Assessment/Calculations:       Physical Exam:    VS:  BP 96/60 (BP  Location: Left Arm, Patient Position: Sitting, Cuff Size: Normal)   Pulse 62   Ht '5\' 7"'$  (1.702 m)   Wt 234 lb (106.1 kg)   SpO2 90%   BMI 36.65 kg/m     Wt Readings from Last 3 Encounters:  01/04/21 234 lb (106.1 kg)  12/01/20 230 lb (104.3 kg)  11/22/20 234 lb 12.8 oz (106.5 kg)     GEN:  Well nourished, well developed in no acute distress, ibese HEENT: Normal NECK: No JVD; No carotid bruits LYMPHATICS: No lymphadenopathy CARDIAC: RRR, 4/6 harsh SEM. No rubs, gallops RESPIRATORY:  Clear to auscultation without rales, wheezing or rhonchi  ABDOMEN: Soft, non-tender, non-distended MUSCULOSKELETAL:  No edema; No deformity  SKIN: Warm and dry. Diffuse erythematous rash on both groins, R>L under pannus. See photos NEUROLOGIC:  Alert and oriented x 3 PSYCHIATRIC:  Normal affect   ASSESSMENT:    1. Candidal skin infection   2. Severe aortic stenosis    PLAN:    In order of problems listed above:  Candidal skin infection: she has had ongoing skin infections under her pannus right around the access sites we would use for TF access. She has been started on Diflucan '200mg'$  daily and Nystain ointment BID. She has been advised to keep the area as clean and dry as possible. We will re-evaluate her groins at her PAT appt on Monday and make a decision about surgery. (See photos below- R>L)      Critical AS: she has severe symptomatic critical AS. We are hopefly we can get  her done next Tuesday pending the resolution of her groin infections. She will be re-examined on Monday after antifungal treatment which was Rx'd today. We will decide on Monday if we need to cancel her case or consider alternative acces. Dr. Burt Knack preferred to go TF if we can.      Medication Adjustments/Labs and Tests Ordered: Current medicines are reviewed at length with the patient today.  Concerns regarding medicines are outlined above.  No orders of the defined types were placed in this encounter.  No orders of the defined types were placed in this encounter.   Patient Instructions     Keep instructions for surgery next week.       Signed, Angelena Form, PA-C  01/04/2021 10:31 PM    [facility]

## 2021-01-04 NOTE — Progress Notes (Signed)
Surgical Instructions    Your procedure is scheduled on 01/09/21.  Report to [facility] Main Entrance "A" at 10:15 A.M., then check in with the Admitting office.  Call this number if you have problems the morning of surgery:  [phone]   If you have any questions prior to your surgery date call [phone]: Open Monday-Friday 8am-4pm    Remember:  Do not eat or drink after midnight the night before your surgery     Take these medicines the morning of surgery with A SIP OF WATER: NONE  Continue all medications up through day before surgery.  Last dose of Eliquis on 01/03/21.   As of today, STOP taking any Aspirin (unless otherwise instructed by your surgeon) Aleve, Naproxen, Ibuprofen, Motrin, Advil, Goody's, BC's, all herbal medications, fish oil, and all vitamins.          Do not wear jewelry or makeup Do not wear lotions, powders, perfumes or deodorant. Do not shave 48 hours prior to surgery.   Do not bring valuables to the hospital. DO Not wear nail polish, gel polish, artificial nails, or any other type of covering on  natural nails including finger and toenails. If patients have artificial nails, gel coating, etc. that need to be removed by a nail salon please have this removed prior to surgery or surgery may need to be canceled/delayed if the surgeon/ anesthesia feels like the patient is unable to be adequately monitored.             [place] is not responsible for any belongings or valuables.  Do NOT Smoke (Tobacco/Vaping) or drink Alcohol 24 hours prior to your procedure If you use a CPAP at night, you may bring all equipment for your overnight stay.   Contacts, glasses, dentures or bridgework may not be worn into surgery, please bring cases for these belongings   For patients admitted to the hospital, discharge time will be determined by your treatment team.   Patients discharged the day of surgery will not be allowed to drive home, and someone needs to stay with  them for 24 hours.  ONLY 1 SUPPORT PERSON MAY BE PRESENT WHILE YOU ARE IN SURGERY. IF YOU ARE TO BE ADMITTED ONCE YOU ARE IN YOUR ROOM YOU WILL BE ALLOWED TWO (2) VISITORS.  Minor children may have two parents present. Special consideration for safety and communication needs will be reviewed on a case by case basis.  Special instructions:    Oral Hygiene is also important to reduce your risk of infection.  Remember - BRUSH YOUR TEETH THE MORNING OF SURGERY WITH YOUR REGULAR TOOTHPASTE   [place]- Preparing For Surgery  Before surgery, you can play an important role. Because skin is not sterile, your skin needs to be as free of germs as possible. You can reduce the number of germs on your skin by washing with CHG (chlorahexidine gluconate) Soap before surgery.  CHG is an antiseptic cleaner which kills germs and bonds with the skin to continue killing germs even after washing.     Please do not use if you have an allergy to CHG or antibacterial soaps. If your skin becomes reddened/irritated stop using the CHG.  Do not shave (including legs and underarms) for at least 48 hours prior to first CHG shower. It is OK to shave your face.  Please follow these instructions carefully.     Shower the NIGHT BEFORE SURGERY and the MORNING OF SURGERY with CHG Soap.   If you  chose to wash your hair, wash your hair first as usual with your normal shampoo. After you shampoo, rinse your hair and body thoroughly to remove the shampoo.  Then [employer] and genitals (private parts) with your normal soap and rinse thoroughly to remove soap.  After that Use CHG Soap as you would any other liquid soap. You can apply CHG directly to the skin and wash gently with a scrungie or a clean washcloth.   Apply the CHG Soap to your body ONLY FROM THE NECK DOWN.  Do not use on open wounds or open sores. Avoid contact with your eyes, ears, mouth and genitals (private parts). Wash Face and genitals (private parts)  with your  normal soap.   Wash thoroughly, paying special attention to the area where your surgery will be performed.  Thoroughly rinse your body with warm water from the neck down.  DO NOT shower/wash with your normal soap after using and rinsing off the CHG Soap.  Pat yourself dry with a CLEAN TOWEL.  Wear CLEAN PAJAMAS to bed the night before surgery  Place CLEAN SHEETS on your bed the night before your surgery  DO NOT SLEEP WITH PETS.   Day of Surgery:  Take a shower with CHG soap. Wear Clean/Comfortable clothing the morning of surgery Do not apply any deodorants/lotions.   Remember to brush your teeth WITH YOUR REGULAR TOOTHPASTE.   Please read over the following fact sheets that you were given.

## 2021-01-04 NOTE — Patient Instructions (Signed)
    Keep instructions for surgery next week.

## 2021-01-05 ENCOUNTER — Inpatient Hospital Stay (HOSPITAL_COMMUNITY)
Admission: RE | Admit: 2021-01-05 | Discharge: 2021-01-05 | Disposition: A | Discharge status: Home / Self Care | Payer: Medicare Other | Source: Ambulatory Visit

## 2021-01-08 ENCOUNTER — Encounter (HOSPITAL_COMMUNITY)
Admission: RE | Admit: 2021-01-08 | Discharge: 2021-01-08 | Disposition: A | Discharge status: Home / Self Care | Payer: Medicare Other | Source: Ambulatory Visit | Attending: Cardiovascular Disease | Admitting: Cardiovascular Disease

## 2021-01-08 ENCOUNTER — Other Ambulatory Visit: Payer: Self-pay

## 2021-01-08 ENCOUNTER — Ambulatory Visit (HOSPITAL_COMMUNITY)
Admission: RE | Admit: 2021-01-08 | Discharge: 2021-01-08 | Disposition: A | Discharge status: Home / Self Care | Payer: Medicare Other | Source: Ambulatory Visit | Attending: Cardiovascular Disease | Admitting: Cardiovascular Disease

## 2021-01-08 ENCOUNTER — Encounter (HOSPITAL_COMMUNITY): Payer: Self-pay

## 2021-01-08 ENCOUNTER — Inpatient Hospital Stay (HOSPITAL_COMMUNITY): Admission: RE | Admit: 2021-01-08 | Payer: Medicare Other | Source: Ambulatory Visit

## 2021-01-08 DIAGNOSIS — I2510 Atherosclerotic heart disease of native coronary artery without angina pectoris: Secondary | ICD-10-CM | POA: Diagnosis not present

## 2021-01-08 DIAGNOSIS — J42 Unspecified chronic bronchitis: Secondary | ICD-10-CM | POA: Diagnosis not present

## 2021-01-08 DIAGNOSIS — Z952 Presence of prosthetic heart valve: Secondary | ICD-10-CM | POA: Diagnosis not present

## 2021-01-08 DIAGNOSIS — Z9181 History of falling: Secondary | ICD-10-CM | POA: Diagnosis not present

## 2021-01-08 DIAGNOSIS — D649 Anemia, unspecified: Secondary | ICD-10-CM | POA: Diagnosis not present

## 2021-01-08 DIAGNOSIS — Z20822 Contact with and (suspected) exposure to covid-19: Secondary | ICD-10-CM | POA: Insufficient documentation

## 2021-01-08 DIAGNOSIS — Z01818 Encounter for other preprocedural examination: Secondary | ICD-10-CM | POA: Insufficient documentation

## 2021-01-08 DIAGNOSIS — Z006 Encounter for examination for normal comparison and control in clinical research program: Secondary | ICD-10-CM | POA: Diagnosis not present

## 2021-01-08 DIAGNOSIS — J45909 Unspecified asthma, uncomplicated: Secondary | ICD-10-CM | POA: Diagnosis not present

## 2021-01-08 DIAGNOSIS — Z6836 Body mass index (BMI) 36.0-36.9, adult: Secondary | ICD-10-CM | POA: Insufficient documentation

## 2021-01-08 DIAGNOSIS — I10 Essential (primary) hypertension: Secondary | ICD-10-CM | POA: Insufficient documentation

## 2021-01-08 DIAGNOSIS — I4819 Other persistent atrial fibrillation: Secondary | ICD-10-CM | POA: Diagnosis not present

## 2021-01-08 DIAGNOSIS — I119 Hypertensive heart disease without heart failure: Secondary | ICD-10-CM | POA: Diagnosis not present

## 2021-01-08 DIAGNOSIS — E669 Obesity, unspecified: Secondary | ICD-10-CM | POA: Insufficient documentation

## 2021-01-08 DIAGNOSIS — I2582 Chronic total occlusion of coronary artery: Secondary | ICD-10-CM | POA: Diagnosis not present

## 2021-01-08 DIAGNOSIS — I447 Left bundle-branch block, unspecified: Secondary | ICD-10-CM | POA: Diagnosis not present

## 2021-01-08 DIAGNOSIS — I350 Nonrheumatic aortic (valve) stenosis: Secondary | ICD-10-CM | POA: Diagnosis not present

## 2021-01-08 DIAGNOSIS — I739 Peripheral vascular disease, unspecified: Secondary | ICD-10-CM | POA: Diagnosis not present

## 2021-01-08 DIAGNOSIS — R2689 Other abnormalities of gait and mobility: Secondary | ICD-10-CM | POA: Diagnosis present

## 2021-01-08 DIAGNOSIS — I517 Cardiomegaly: Secondary | ICD-10-CM | POA: Diagnosis not present

## 2021-01-08 DIAGNOSIS — Z87442 Personal history of urinary calculi: Secondary | ICD-10-CM | POA: Diagnosis not present

## 2021-01-08 DIAGNOSIS — R001 Bradycardia, unspecified: Secondary | ICD-10-CM | POA: Diagnosis not present

## 2021-01-08 DIAGNOSIS — Z951 Presence of aortocoronary bypass graft: Secondary | ICD-10-CM | POA: Diagnosis not present

## 2021-01-08 DIAGNOSIS — G4733 Obstructive sleep apnea (adult) (pediatric): Secondary | ICD-10-CM | POA: Diagnosis present

## 2021-01-08 DIAGNOSIS — M419 Scoliosis, unspecified: Secondary | ICD-10-CM | POA: Diagnosis not present

## 2021-01-08 DIAGNOSIS — I498 Other specified cardiac arrhythmias: Secondary | ICD-10-CM | POA: Diagnosis not present

## 2021-01-08 DIAGNOSIS — Z954 Presence of other heart-valve replacement: Secondary | ICD-10-CM | POA: Diagnosis not present

## 2021-01-08 DIAGNOSIS — M1990 Unspecified osteoarthritis, unspecified site: Secondary | ICD-10-CM | POA: Diagnosis present

## 2021-01-08 DIAGNOSIS — Z79899 Other long term (current) drug therapy: Secondary | ICD-10-CM | POA: Insufficient documentation

## 2021-01-08 DIAGNOSIS — I252 Old myocardial infarction: Secondary | ICD-10-CM | POA: Diagnosis not present

## 2021-01-08 DIAGNOSIS — Z7901 Long term (current) use of anticoagulants: Secondary | ICD-10-CM | POA: Diagnosis not present

## 2021-01-08 DIAGNOSIS — E785 Hyperlipidemia, unspecified: Secondary | ICD-10-CM | POA: Diagnosis not present

## 2021-01-08 DIAGNOSIS — K219 Gastro-esophageal reflux disease without esophagitis: Secondary | ICD-10-CM | POA: Diagnosis not present

## 2021-01-08 DIAGNOSIS — I25810 Atherosclerosis of coronary artery bypass graft(s) without angina pectoris: Secondary | ICD-10-CM | POA: Diagnosis not present

## 2021-01-08 HISTORY — DX: Malignant (primary) neoplasm, unspecified: C80.1

## 2021-01-08 HISTORY — DX: Cardiac murmur, unspecified: R01.1

## 2021-01-08 HISTORY — DX: Personal history of urinary calculi: Z87.442

## 2021-01-08 HISTORY — DX: Cardiac arrhythmia, unspecified: I49.9

## 2021-01-08 HISTORY — DX: Anemia, unspecified: D64.9

## 2021-01-08 HISTORY — DX: Pneumonia, unspecified organism: J18.9

## 2021-01-08 HISTORY — DX: Dyspnea, unspecified: R06.00

## 2021-01-08 LAB — COMPREHENSIVE METABOLIC PANEL
ALT: 24 U/L (ref 0–44)
AST: 25 U/L (ref 15–41)
Albumin: 3.6 g/dL (ref 3.5–5.0)
Alkaline Phosphatase: 70 U/L (ref 38–126)
Anion gap: 12 (ref 5–15)
BUN: 22 mg/dL (ref 8–23)
CO2: 21 mmol/L — ABNORMAL LOW (ref 22–32)
Calcium: 9.4 mg/dL (ref 8.9–10.3)
Chloride: 103 mmol/L (ref 98–111)
Creatinine, Ser: 0.96 mg/dL (ref 0.44–1.00)
GFR, Estimated: 58 mL/min — ABNORMAL LOW (ref >60)
Glucose, Bld: 189 mg/dL — ABNORMAL HIGH (ref 70–99)
Potassium: 4 mmol/L (ref 3.5–5.1)
Sodium: 136 mmol/L (ref 135–145)
Total Bilirubin: 1.2 mg/dL (ref 0.3–1.2)
Total Protein: 7.2 g/dL (ref 6.5–8.1)

## 2021-01-08 LAB — BLOOD GAS, ARTERIAL
Acid-Base Excess: 0.3 mmol/L (ref 0.0–2.0)
Bicarbonate: 24.7 mmol/L (ref 20.0–28.0)
Drawn by: 602861
FIO2: 21
O2 Saturation: 94.7 %
Patient temperature: 37
pCO2 arterial: 42.6 mmHg (ref 32.0–48.0)
pH, Arterial: 7.382 (ref 7.350–7.450)
pO2, Arterial: 75.8 mmHg — ABNORMAL LOW (ref 83.0–108.0)

## 2021-01-08 LAB — URINALYSIS, ROUTINE W REFLEX MICROSCOPIC
Bilirubin Urine: NEGATIVE
Glucose, UA: NEGATIVE mg/dL
Hgb urine dipstick: NEGATIVE
Ketones, ur: NEGATIVE mg/dL
Leukocytes,Ua: NEGATIVE
Nitrite: NEGATIVE
Protein, ur: 100 mg/dL — AB
Specific Gravity, Urine: 1.018 (ref 1.005–1.030)
pH: 5 (ref 5.0–8.0)

## 2021-01-08 LAB — PROTIME-INR
INR: 1.1 (ref 0.8–1.2)
Prothrombin Time: 14.3 seconds (ref 11.4–15.2)

## 2021-01-08 LAB — CBC
HCT: 45.4 % (ref 36.0–46.0)
Hemoglobin: 14.6 g/dL (ref 12.0–15.0)
MCH: 29.9 pg (ref 26.0–34.0)
MCHC: 32.2 g/dL (ref 30.0–36.0)
MCV: 92.8 fL (ref 80.0–100.0)
Platelets: 198 10*3/uL (ref 150–400)
RBC: 4.89 MIL/uL (ref 3.87–5.11)
RDW: 14.9 % (ref 11.5–15.5)
WBC: 9.5 10*3/uL (ref 4.0–10.5)
nRBC: 0 % (ref 0.0–0.2)

## 2021-01-08 LAB — SARS CORONAVIRUS 2 (TAT 6-24 HRS): SARS Coronavirus 2: NEGATIVE

## 2021-01-08 LAB — SURGICAL PCR SCREEN
MRSA, PCR: NEGATIVE
Staphylococcus aureus: NEGATIVE

## 2021-01-08 MED ORDER — POTASSIUM CHLORIDE 2 MEQ/ML IV SOLN
80.0000 meq | INTRAVENOUS | Status: DC
  Filled 2021-01-08: qty 40

## 2021-01-08 MED ORDER — DEXMEDETOMIDINE HCL IN NACL 400 MCG/100ML IV SOLN
0.1000 ug/kg/h | INTRAVENOUS | Status: DC
  Filled 2021-01-08 (×2): qty 100

## 2021-01-08 MED ORDER — MAGNESIUM SULFATE 50 % IJ SOLN
40.0000 meq | INTRAMUSCULAR | Status: DC
  Filled 2021-01-08: qty 9.85

## 2021-01-08 MED ORDER — NOREPINEPHRINE 4 MG/250ML-% IV SOLN
0.0000 ug/min | INTRAVENOUS | Status: DC
  Filled 2021-01-08: qty 250

## 2021-01-08 MED ORDER — LEVOFLOXACIN IN D5W 500 MG/100ML IV SOLN
500.0000 mg | Freq: Once | INTRAVENOUS | Status: AC
  Administered 2021-01-09: 500 mg via INTRAVENOUS
  Filled 2021-01-08 (×2): qty 100

## 2021-01-08 MED ORDER — CEFAZOLIN SODIUM-DEXTROSE 2-4 GM/100ML-% IV SOLN
2.0000 g | INTRAVENOUS | Status: DC
  Filled 2021-01-08 (×2): qty 100

## 2021-01-08 MED ORDER — SODIUM CHLORIDE 0.9 % IV SOLN
INTRAVENOUS | Status: DC
  Filled 2021-01-08: qty 30

## 2021-01-08 NOTE — Progress Notes (Addendum)
Abnormal lab in PAT - UA - bacteria rare; protein - 100; color urine - amber. CXR - Enlargement of cardiac silhouette post CABG. Chronic bronchitic changes with lingular scarring. Dr. Burt Knack office was notified Ander Purpura, Owens Shark, RN).

## 2021-01-08 NOTE — Progress Notes (Signed)
Anesthesia Chart Review:  Case: [id number] Date/Time: 01/09/21 1200   Procedures:      TRANSCATHETER AORTIC VALVE REPLACEMENT, LEFT SUBCLAVIAN (Left)     TRANSESOPHAGEAL ECHOCARDIOGRAM (TEE)   Anesthesia type: General   Pre-op diagnosis: Severe Aortic Stenosis   Location: [facility] CATH LAB 6 / [facility]IVE [facility]   Providers: Sherren Mocha, MD     CT Surgeon: Gilford Raid, MD  DISCUSSION: Patient is an 87 year old female scheduled for the above procedure.  History includes never smoker, CAD (MI, s/p CABG 07/06/99: LIMA-LAD, SVG-OM1, SVG-RV marginal-dRCA), murmur/severe AS, dysrhythmia (afib, diagnosed ~ 08/27/20), HTN, PVD, carotid artery stenosis (right carotid endarterectomy 05/22/05), HLD, vertigo, OSA (does not use CPAP), dyspnea, asthma, anemia, vertigo/BPPV, cancer (not specified).  BMI is consistent with obesity.  Last Eliquis 01/03/2021.  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 She is being treated with antifungals.   01/08/2021 presurgical COVID-19 test in process.  Anesthesia team to evaluate on the day of surgery.   VS: BP 118/78   Pulse 82   Temp 36.7 C (Oral)   Resp 18   Ht '5\' 7"'$  (1.702 m)   Wt 104.4 kg   SpO2 92%   BMI 36.04 kg/m    PROVIDERS: Glenda Chroman, MD is PCP  Minus Breeding, MD is cardiologist   LABS: Labs reviewed: Acceptable for surgery. (all labs ordered are listed, but only abnormal results are displayed)  Labs Reviewed  COMPREHENSIVE METABOLIC PANEL - Abnormal; Notable for the following components:      Result Value   CO2 21 (*)    Glucose, Bld 189 (*)    GFR, Estimated 58 (*)    All other components within normal limits  URINALYSIS, ROUTINE W REFLEX MICROSCOPIC - Abnormal; Notable for the following components:   Color, Urine AMBER (*)    Protein, ur 100 (*)    Bacteria, UA RARE (*)    All other components within normal limits  BLOOD GAS, ARTERIAL - Abnormal;  Notable for the following components:   pO2, Arterial 75.8 (*)    Allens test (pass/fail) BRACHIAL ARTERY (*)    All other components within normal limits  SURGICAL PCR SCREEN  SARS CORONAVIRUS 2 (TAT 6-24 HRS)  CBC  PROTIME-INR  TYPE AND SCREEN     IMAGES: CXR 01/08/21:  FINDINGS: Enlargement of cardiac silhouette post CABG. Mediastinal contours and pulmonary vascularity normal. Chronic bronchitic changes with lingular scarring. No pulmonary infiltrate, pleural effusion, or pneumothorax. Bones demineralized with RIGHT shoulder prosthesis and dextroconvex thoracic scoliosis noted. IMPRESSION: Enlargement of cardiac silhouette post CABG. Chronic bronchitic changes with lingular scarring.   EKG: EKG 01/08/21: Undetermined rhythm Left ventricular hypertrophy with repolarization abnormality ( R in aVL , Cornell product ) Inferior infarct , age undetermined Abnormal ECG - Appears to be afib at 67 bpm. She was in afib on prior EKG from 12/01/20.   EKG 12/01/20: Atrial fibrillation Minimal voltage criteria for LVH, may be normal variant ( R in aVL ) Inferior infarct , age undetermined Anterior infarct , age undetermined Abnormal ECG Confirmed by Eleonore Chiquito [id number]) on 12/01/2020 8:01:17 PM   CV: See 01/02/21 office note by Dr. Cyndia Bent for summary (or see Results Review tab). In addition:  RHC/LHC 12/01/20 (Dr. Angelena Form)   Prox RCA to Mid RCA lesion is 100% stenosed.   Origin to Prox Graft lesion before Acute Mrg  is 100% stenosed.   Origin to Prox Graft lesion is  100% stenosed.   Ost Cx to Prox Cx lesion is 70% stenosed.   Mid LAD lesion is 100% stenosed.   SVG graft was visualized by angiography.   SVG graft was visualized by angiography.   LIMA graft was visualized by angiography.   Severe three vessel CAD s/p 4V CABG with 1/4 patent bypass grafts Chronic total occlusion of the mid LAD. The mid and distal LAD fills from the patent LIMA graft.  Moderately severe  proximal Circumflex stenosis. The vein graft to the Circumflex system is chronically occluded Chronic occlusion of the proximal RCA. The sequential vein graft to the RV marginal branch and PDA is chronically occluded. The distal RCA and branches fill from right to right and left to right collaterals.  Severe aortic stenosis (mean gradient 49.4 mmHg, Peak to peak gradient 58 mmHg, AVA 0.74 cm2)   Recommendations: I would favor medical management of her CAD. The proximal Circumflex stenoses are moderately severe however this is not critical. Will continue workup for TAVR.    US Carotid 02/29/20: Summary:  - Right Carotid: Velocities in the right ICA are consistent with a 1-39%  stenosis.  - Left Carotid: Velocities in the left ICA are consistent with a 1-39%  stenosis.  - Vertebrals: Bilateral vertebral arteries demonstrate antegrade flow.    Past Medical History:  Diagnosis Date   Anemia    Arthritis    Asthma    BPV (benign positional vertigo)    Cancer (HCC)    Carotid artery disease ([place])    s/p R CEA (2007)   Carotid artery occlusion    Coronary artery disease    s/p MI 2001--(catheterizations in 2001 with left main 70% stenosis, LAD 70% followed by 90% stenosis, circumflex 99% stenosis, right coronary artery occluded.    Dyspnea    Dysrhythmia    Heart murmur    History of kidney stones    Hyperlipidemia    Hypertension    Nephrolithiasis    Peripheral vascular disease ([place])    Pneumonia    Sleep apnea    CPAP   no   Vertigo     Past Surgical History:  Procedure Laterality Date   CARDIAC CATHETERIZATION     2001   CARDIAC SURGERY     CAROTID ENDARTERECTOMY Right    cea  Dr. Amedeo Plenty   CARPAL TUNNEL RELEASE Right    CORONARY ARTERY BYPASS GRAFT     2001 (LIMA to LAD, SVG to obtuse marginal, sequential  SVG to RV , marginal branch in the distal right coronary artery.   EYE SURGERY     bilateral cataracts   knee arthoscopy     left carpal tunnel     ORIF SHOULDER  FRACTURE Right 08/30/2013   Procedure: HEMI ARTHROPLASTY;  Surgeon: [name], MD;  Location: [facility];  Service: Orthopedics;  Laterality: Right;   right carotid endarterectomy     RIGHT/LEFT HEART CATH AND CORONARY/GRAFT ANGIOGRAPHY N/A 12/01/2020   Procedure: RIGHT/LEFT HEART CATH AND CORONARY/GRAFT ANGIOGRAPHY;  Surgeon: Burnell Blanks, MD;  Location: [facility] CV LAB;  Service: Cardiovascular;  Laterality: N/A;   TONSILLECTOMY     VASCULAR SURGERY      MEDICATIONS:  acetaminophen (TYLENOL) 500 MG tablet   apixaban (ELIQUIS) 5 MG TABS tablet   atenolol-chlorthalidone (TENORETIC) 100-25 MG tablet   atorvastatin (LIPITOR) 10 MG tablet   bisacodyl (DULCOLAX) 5 MG EC tablet   BREO ELLIPTA 100-25 MCG/INH AEPB   Cyanocobalamin (B-12) 2500 MCG  TABS   fexofenadine (ALLEGRA) 180 MG tablet   fluconazole (DIFLUCAN) 100 MG tablet   furosemide (LASIX) 20 MG tablet   Glycerin-Hypromellose-PEG 400 (DRY EYE RELIEF DROPS) 0.2-0.2-1 % SOLN   meclizine (ANTIVERT) 25 MG tablet   Multiple Vitamin (MULTIVITAMIN) capsule   nystatin ointment (MYCOSTATIN)   POTASSIUM PO   No current facility-administered medications for this encounter.    [START ON 01/09/2021] ceFAZolin (ANCEF) IVPB 2g/100 mL premix   [START ON 01/09/2021] dexmedetomidine (PRECEDEX) 400 MCG/100ML (4 mcg/mL) infusion   [START ON 01/09/2021] heparin 30,000 units/NS 1000 mL solution for CELLSAVER   [START ON 01/09/2021] magnesium sulfate (IV Push/IM) injection 40 mEq   [START ON 01/09/2021] norepinephrine (LEVOPHED) '4mg'$  in 270m premix infusion   [START ON 01/09/2021] potassium chloride injection 80 mEq    AMyra Gianotti PA-C Surgical Short Stay/Anesthesiology MMcal[facility] Surgery Center LLCPhone [phone] Andrews Hospital And Healthcare CenterPhone [phone]/22/2022 2:44 PM

## 2021-01-08 NOTE — Progress Notes (Signed)
PCP - Jerene Bears, MD Cardiologist - Minus Breeding, MD  PPM/ICD - denies Device Orders - N/A Rep Notified - N/A  Chest x-ray - 01/08/2021 EKG - 01/08/2021 Stress Test - 12/28/2015 ECHO - 11/09/2020 Cardiac Cath - 12/01/2020  Sleep Study - yes, positive CPAP - no  Fasting Blood Sugar - N/A  Blood Thinner Instructions: Eliquis - last dose 01/03/2021 per MD order  Aspirin Instructions: Patient was instructed: As of today, STOP taking any Aspirin (unless otherwise instructed by your surgeon) Aleve, Naproxen, Ibuprofen, Motrin, Advil, Goody's, BC's, all herbal medications, fish oil, and all vitamins.  ERAS Protcol - No  COVID TEST- 01/08/2021 in PAT   Anesthesia review: yes - cardiac history  Patient denies shortness of breath, fever, cough and chest pain at PAT appointment   All instructions explained to the patient, with a verbal understanding of the material. Patient agrees to go over the instructions while at home for a better understanding. Patient also instructed to self quarantine after being tested for COVID-19. The opportunity to ask questions was provided.

## 2021-01-08 NOTE — H&P (Signed)
[address]       [place] [address]             [phone]      Cardiothoracic Surgery Admission History and Physical   PCP is Glenda Chroman, MD Referring Provider is Dr. Sherren Mocha Primary Cardiologist is Minus Breeding, MD   Reason for admission: Critical aortic stenosis   HPI:   The patient is an 87 year old woman with a history of hypertension, hyperlipidemia carotid artery disease status post right carotid endarterectomy in 2007, peripheral vascular disease, coronary artery disease status post coronary bypass graft surgery by Dr. Darcey Nora in 2001 after myocardial infarction, untreated sleep apnea, and aortic stenosis that has been followed by Dr. Percival Spanish for over a decade.  She had an echocardiogram in August 2021 showing moderate to severe aortic stenosis with a mean gradient of 37 mmHg and a valve area by VTI of 0.8 cm.  There is trivial aortic insufficiency.  Left ventricular systolic function was normal with severe LVH.  She was asymptomatic at that time and states she was feeling fine until April 2022 when she began developing progressive exertional fatigue and dyspnea as well as chest pressure.  She has had occasional dizziness but no syncope.  She has had peripheral edema.  She denies orthopnea and PND.  Her most recent echo on 11/09/2020 showed an increase in the mean gradient across aortic valve to 60 mmHg with a peak gradient of 109 mmHg.  Aortic valve area was 0.48 cm.  There was severe thickening and calcification of the aortic valve with restricted mobility.  Left ventricular ejection fraction was normal with severe concentric LVH.  There was moderate right ventricular systolic dysfunction.   She lives with her husband at [employer].  They both use a walker to get around due to gait instability.  She is still independent and takes care of her apartment.  She had a mechanical fall 3 to 4 years ago with a severe injury to her  right humerus requiring surgical repair and has not regained full use of her arm since that.       Past Medical History:  Diagnosis Date   Arthritis     Asthma     BPV (benign positional vertigo)     Carotid artery disease ([facility])      s/p R CEA (2007)   Carotid artery occlusion     Coronary artery disease      s/p MI 2001--(catheterizations in 2001 with left main 70% stenosis, LAD 70% followed by 90% stenosis, circumflex 99% stenosis, right coronary artery occluded.    Hyperlipidemia     Hypertension     Nephrolithiasis     Peripheral vascular disease ([facility])     Sleep apnea      CPAP   no   Vertigo             Past Surgical History:  Procedure Laterality Date   CARDIAC CATHETERIZATION        2001   CARDIAC SURGERY       CAROTID ENDARTERECTOMY Right      cea  Dr. Amedeo Plenty   CARPAL TUNNEL RELEASE Right     CORONARY ARTERY BYPASS GRAFT        2001 (LIMA to LAD, SVG to obtuse marginal, sequential  SVG to RV , marginal branch in the distal right coronary artery.   knee arthoscopy       left carpal tunnel  ORIF SHOULDER FRACTURE Right 08/30/2013    Procedure: HEMI ARTHROPLASTY;  Surgeon: [name], MD;  Location: [facility];  Service: Orthopedics;  Laterality: Right;   right carotid endarterectomy       RIGHT/LEFT HEART CATH AND CORONARY/GRAFT ANGIOGRAPHY N/A 12/01/2020    Procedure: RIGHT/LEFT HEART CATH AND CORONARY/GRAFT ANGIOGRAPHY;  Surgeon: Burnell Blanks, MD;  Location: [facility];  Service: Cardiovascular;  Laterality: N/A;   TONSILLECTOMY               Family History  Problem Relation Age of Onset   Ovarian cancer Mother     Cancer Mother          Ovarian   COPD Father     Aneurysm Father          Throat   Kidney disease Father     Heart disease Maternal Grandmother     Heart disease Maternal Grandfather        Social History         Socioeconomic History   Marital status: Married      Spouse name: Not on file   Number of children: Not  on file   Years of education: Not on file   Highest education level: Not on file  Occupational History   Not on file  Tobacco Use   Smoking status: Never   Smokeless tobacco: Never  Vaping Use   Vaping Use: Never used  Substance and Sexual Activity   Alcohol use: Not Currently   Drug use: Not Currently   Sexual activity: Not on file  Other Topics Concern   Not on file  Social History Narrative    ** Merged History Encounter **         Social Determinants of Health    Financial Resource Strain: Not on file  Food Insecurity: Not on file  Transportation Needs: Not on file  Physical Activity: Not on file  Stress: Not on file  Social Connections: Not on file  Intimate Partner Violence: Not on file             Prior to Admission medications   Medication Sig Start Date End Date Taking? Authorizing Provider  acetaminophen (TYLENOL) 500 MG tablet Take 1,000 mg by mouth every 6 (six) hours as needed for moderate pain or mild pain.     Yes [provider]  apixaban (ELIQUIS) 5 MG TABS tablet Take 1 tablet (5 mg total) by mouth 2 (two) times daily. 10/18/20   Yes Minus Breeding, MD  atenolol-chlorthalidone (TENORETIC) 100-25 MG tablet TAKE 1 TABLET ONCE DAILY. 01/01/21   Yes Minus Breeding, MD  atorvastatin (LIPITOR) 10 MG tablet Take 1 tablet (10 mg total) by mouth daily. 12/22/19   Yes Minus Breeding, MD  bisacodyl (DULCOLAX) 5 MG EC tablet Take 5-10 mg by mouth daily as needed for moderate constipation.     Yes [provider]  BREO ELLIPTA 100-25 MCG/INH AEPB Inhale 1 puff into the lungs daily. 11/07/20   Yes [provider]  Cyanocobalamin (B-12) 2500 MCG TABS Take 2,500 mcg by mouth 3 (three) times a week.     Yes [provider]  fexofenadine (ALLEGRA) 180 MG tablet Take 180 mg by mouth daily as needed for allergies.     Yes [provider]  fluconazole (DIFLUCAN) 100 MG tablet Take 1 tablet (100 mg total) by mouth daily. 11/22/20   Yes  Eileen Stanford, PA-C  furosemide (LASIX) 20 MG tablet  TAKE (1/2) TO (1) TABLET DAILY AS NEEDED. Patient taking differently: Take 10 mg by mouth daily as needed for edema. 04/04/20   Yes Minus Breeding, MD  Glycerin-Hypromellose-PEG 400 (DRY EYE RELIEF DROPS) 0.2-0.2-1 % SOLN Place 1 drop into both eyes daily as needed (Dry eye).     Yes [provider]  meclizine (ANTIVERT) 25 MG tablet Take 25 mg by mouth daily as needed for dizziness.     Yes [provider]  Multiple Vitamin (MULTIVITAMIN) capsule Take 1 capsule by mouth daily. Woman     Yes [provider]  nystatin ointment (MYCOSTATIN) Apply 1 application topically 2 (two) times daily. 11/22/20   Yes Eileen Stanford, PA-C  POTASSIUM PO Take 1 tablet by mouth daily as needed (cramps).     Yes [provider]            Current Outpatient Medications  Medication Sig Dispense Refill   acetaminophen (TYLENOL) 500 MG tablet Take 1,000 mg by mouth every 6 (six) hours as needed for moderate pain or mild pain.       apixaban (ELIQUIS) 5 MG TABS tablet Take 1 tablet (5 mg total) by mouth 2 (two) times daily. 180 tablet 1   atenolol-chlorthalidone (TENORETIC) 100-25 MG tablet TAKE 1 TABLET ONCE DAILY. 90 tablet 2   atorvastatin (LIPITOR) 10 MG tablet Take 1 tablet (10 mg total) by mouth daily. 90 tablet 3   bisacodyl (DULCOLAX) 5 MG EC tablet Take 5-10 mg by mouth daily as needed for moderate constipation.       BREO ELLIPTA 100-25 MCG/INH AEPB Inhale 1 puff into the lungs daily.       Cyanocobalamin (B-12) 2500 MCG TABS Take 2,500 mcg by mouth 3 (three) times a week.       fexofenadine (ALLEGRA) 180 MG tablet Take 180 mg by mouth daily as needed for allergies.       fluconazole (DIFLUCAN) 100 MG tablet Take 1 tablet (100 mg total) by mouth daily. 10 tablet 0   furosemide (LASIX) 20 MG tablet TAKE (1/2) TO (1) TABLET DAILY AS NEEDED. (Patient taking differently: Take 10 mg by mouth daily as needed for  edema.) 45 tablet 10   Glycerin-Hypromellose-PEG 400 (DRY EYE RELIEF DROPS) 0.2-0.2-1 % SOLN Place 1 drop into both eyes daily as needed (Dry eye).       meclizine (ANTIVERT) 25 MG tablet Take 25 mg by mouth daily as needed for dizziness.       Multiple Vitamin (MULTIVITAMIN) capsule Take 1 capsule by mouth daily. Woman       nystatin ointment (MYCOSTATIN) Apply 1 application topically 2 (two) times daily. 30 g 3   POTASSIUM PO Take 1 tablet by mouth daily as needed (cramps).        No current facility-administered medications for this visit.           Allergies  Allergen Reactions   Codeine Hives, Itching and Other (See Comments)      extreme agitation   Losartan Other (See Comments)      "I can't walk, I can't do anything"    Oxycodone Hives and Itching   Penicillins Swelling and Rash      Has patient had a PCN reaction causing immediate rash, facial/tongue/throat swelling, SOB or lightheadedness with hypotension: NO Has patient had a PCN reaction causing severe rash involving mucus membranes or skin necrosis: NO Has patient had a PCN reaction that required hospitalization: NO  Has patient had a  PCN reaction occurring within the last 10 years: NO If all of the above answers are "NO", then may proceed with Cephalosporin use.       Pentazocine Other (See Comments) and Nausea And Vomiting   Quinapril Cough   Sibutramine Palpitations   Sulfamethoxazole Hives and Nausea And Vomiting      hives   Sulfonamide Derivatives Rash   Ace Inhibitors Cough   Ak-Mycin [Erythromycin] Other (See Comments)      unknown   Drug Ingredient [Modified Tree Tyrosine Adsorbate] Other (See Comments)      unknown   Eggs Or Egg-Derived Products Hives, Diarrhea and Other (See Comments)      Pt stated "My esophagus swells, I get hives, diarrhea, and I pass out"          Review of Systems:               General:                      normal appetite, + decreased energy, no weight gain, no weight loss,  no fever             Cardiac:                       + chest tightness with exertion, no chest pain at rest, +SOB with mild exertion, no resting SOB, no PND, + orthopnea, no palpitations, + arrhythmia, + atrial fibrillation, + LE edema, + dizzy spells, no syncope             Respiratory:                 + exertional shortness of breath, no home oxygen, no productive cough, + dry cough, no bronchitis, no wheezing, no hemoptysis, + asthma, no pain with inspiration or cough, + sleep apnea, no CPAP at night             GI:                               no difficulty swallowing, no reflux, no frequent heartburn, no hiatal hernia, no abdominal pain, + constipation, no diarrhea, no hematochezia, no hematemesis, no melena             GU:                              no dysuria,  no frequency, no urinary tract infection, no hematuria,  no kidney stones, no kidney disease             Vascular:                     no pain suggestive of claudication, no pain in feet, +leg cramps, no varicose veins, no DVT, no non-healing foot ulcer             Neuro:                         no stroke, + TIA's, no seizures, no headaches, no temporary blindness one eye,  no slurred speech, no peripheral neuropathy, no chronic pain, + instability of gait, no memory/cognitive dysfunction             Musculoskeletal:         + arthritis, + joint swelling, +  myalgias, + difficulty walking, + reduced mobility              Skin:                            + rash, + itching, no skin infections, no pressure sores or ulcerations             Psych:                         no anxiety, no depression, no nervousness, no unusual recent stress             Eyes:                           no blurry vision, no floaters, no recent vision changes, + wears glasses              ENT:                            + hearing loss, + loose or painful teeth, no dentures, last saw dentist 12/19/20 and had dental extraction 12/13/20.             Hematologic:                no easy bruising, no abnormal bleeding, no clotting disorder, no frequent epistaxis             Endocrine:                   no diabetes, does not check CBG's at home                            Physical Exam:               BP 114/77 (BP Location: Right Arm, Patient Position: Sitting)   Pulse 72   Ht '5\' 7"'$  (1.702 m)   SpO2 93% Comment: RA  BMI 36.02 kg/m              General:                      Obese woman,  well-appearing             HEENT:                       Unremarkable, NCAT, PERLA, EOMI             Neck:                           no JVD, no bruits, no adenopathy              Chest:                          clear to auscultation, symmetrical breath sounds, no wheezes, no rhonchi              CV:                              RRR, 3/6 systolic murmur RSB  Abdomen:                    soft, non-tender, no masses              Extremities:                 warm, well-perfused, pulses palpable at ankles, mild lower extremity edema bilaterally.             Rectal/GU                   Deferred             Neuro:                         Grossly non-focal and symmetrical throughout             Skin:                            Clean and dry, no rashes, no breakdown   Diagnostic Tests:   ECHOCARDIOGRAM REPORT         Patient Name:   TELA LANDERO   Date of Exam: 11/09/2020  Medical Rec #:  [id number]     Height:       67.0 in  Accession #:    [id number]    Weight:       232.6 lb  Date of Birth:  1932/02/19     BSA:          2.156 m  Patient Age:    39 years      BP:           121/79 mmHg  Patient Gender: F             HR:           79 bpm.  Exam Location:  [address]   Procedure: 2D Echo, Cardiac Doppler, Color Doppler and Intracardiac             Opacification Agent            REPORT CONTAINS CRITICAL RESULT     Critical aortic stenosis reported to Dr. Percival Spanish.   Indications:    I35.0 Aortic Stenosis     History:        Patient has prior history of Echocardiogram  examinations,  most                  recent 01/04/2020. CAD, TIA and Carotid Disease; Risk                  Factors:Hypertension and HLD.     Sonographer:    Marygrace Drought RCS  Referring Phys: [place]     1. Left ventricular ejection fraction, by estimation, is 65 to 70%. The  left ventricle has normal function. The left ventricle has no regional  wall motion abnormalities. There is severe concentric left ventricular  hypertrophy. Left ventricular diastolic   function could not be evaluated. Elevated left ventricular end-diastolic  pressure.   2. Right ventricular systolic function is moderately reduced. The right  ventricular size is not well visualized. There is normal pulmonary artery  systolic pressure.   3. Left atrial size was moderately dilated.   4. The mitral valve is abnormal. Trivial mitral valve regurgitation.  Moderate mitral annular calcification.   5. The aortic valve is calcified.  There is severe calcifcation of the  aortic valve. There is severe thickening of the aortic valve. Aortic valve  regurgitation is trivial. Severe aortic valve stenosis. Aortic valve area,  by VTI measures 0.48 cm. Aortic  valve mean gradient measures 60.0 mmHg. Aortic valve Vmax measures 5.22  m/s.   6. The inferior vena cava is normal in size with greater than 50%  respiratory variability, suggesting right atrial pressure of 3 mmHg.   Comparison(s): Changes from prior study are noted. Measurements now  consistent with critical aortic stenosis, findings communicated to Dr.  Percival Spanish.   FINDINGS   Left Ventricle: Left ventricular ejection fraction, by estimation, is 65  to 70%. The left ventricle has normal function. The left ventricle has no  regional wall motion abnormalities. Definity contrast agent was given IV  to delineate the left ventricular   endocardial borders. The left ventricular internal cavity size was normal  in size. There is severe  concentric left ventricular hypertrophy. Left  ventricular diastolic function could not be evaluated due to atrial  fibrillation. Left ventricular diastolic   function could not be evaluated. Elevated left ventricular end-diastolic  pressure.   Right Ventricle: The right ventricular size is not well visualized. Right  vetricular wall thickness was not well visualized. Right ventricular  systolic function is moderately reduced. There is normal pulmonary artery  systolic pressure. The tricuspid  regurgitant velocity is 2.10 m/s, and with an assumed right atrial  pressure of 3 mmHg, the estimated right ventricular systolic pressure is  123XX123 mmHg.   Left Atrium: Left atrial size was moderately dilated.   Right Atrium: Right atrial size was normal in size.   Pericardium: There is no evidence of pericardial effusion.   Mitral Valve: The mitral valve is abnormal. There is mild thickening of  the mitral valve leaflet(s). There is moderate calcification of the mitral  valve leaflet(s). Moderate mitral annular calcification. Trivial mitral  valve regurgitation. MV peak  gradient, 10.4 mmHg. The mean mitral valve gradient is 3.0 mmHg.   Tricuspid Valve: The tricuspid valve is normal in structure. Tricuspid  valve regurgitation is trivial. No evidence of tricuspid stenosis.   Aortic Valve: The aortic valve is calcified. There is severe calcifcation  of the aortic valve. There is severe thickening of the aortic valve.  Aortic valve regurgitation is trivial. Severe aortic stenosis is present.  Aortic valve mean gradient measures  60.0 mmHg. Aortic valve peak gradient measures 109.0 mmHg. Aortic valve  area, by VTI measures 0.48 cm.   Pulmonic Valve: The pulmonic valve was not well visualized. Pulmonic valve  regurgitation is trivial. No evidence of pulmonic stenosis.   Aorta: The aortic root and ascending aorta are structurally normal, with  no evidence of dilitation.   Venous: The  inferior vena cava is normal in size with greater than 50%  respiratory variability, suggesting right atrial pressure of 3 mmHg.   IAS/Shunts: The atrial septum is grossly normal.      LEFT VENTRICLE  PLAX 2D  LVIDd:         3.90 cm  Diastology  LVIDs:         2.40 cm  LV e' medial:    5.44 cm/s  LV PW:         1.70 cm  LV E/e' medial:  24.3  LV IVS:        1.60 cm  LV e' lateral:   7.94 cm/s  LVOT diam:     2.00 cm  LV E/e' lateral: 16.6  LV SV:         64  LV SV Index:   30  LVOT Area:     3.14 cm      RIGHT VENTRICLE  RV S prime:     7.72 cm/s  TAPSE (M-mode): 1.2 cm  RVSP:           20.6 mmHg   LEFT ATRIUM             Index       RIGHT ATRIUM           Index  LA diam:        4.20 cm 1.95 cm/m  RA Pressure: 3.00 mmHg  LA Vol (A2C):   76.5 ml 35.48 ml/m RA Area:     17.20 cm  LA Vol (A4C):   68.5 ml 31.77 ml/m RA Volume:   43.00 ml  19.94 ml/m  LA Biplane Vol: 74.3 ml 34.46 ml/m   AORTIC VALVE  AV Area (Vmax):    0.45 cm  AV Area (Vmean):   0.49 cm  AV Area (VTI):     0.48 cm  AV Vmax:           522.00 cm/s  AV Vmean:          364.000 cm/s  AV VTI:            1.340 m  AV Peak Grad:      109.0 mmHg  AV Mean Grad:      60.0 mmHg  LVOT Vmax:         75.00 cm/s  LVOT Vmean:        56.500 cm/s  LVOT VTI:          0.203 m  LVOT/AV VTI ratio: 0.15     AORTA  Ao Root diam: 2.90 cm  Ao Asc diam:  3.30 cm   MITRAL VALVE                TRICUSPID VALVE  MV Area (PHT): 2.11 cm     TR Peak grad:   17.6 mmHg  MV Area VTI:   1.61 cm     TR Vmax:        210.00 cm/s  MV Peak grad:  10.4 mmHg    Estimated RAP:  3.00 mmHg  MV Mean grad:  3.0 mmHg     RVSP:           20.6 mmHg  MV Vmax:       1.61 m/s  MV Vmean:      81.3 cm/s    SHUNTS  MV Decel Time: 359 msec     Systemic VTI:  0.20 m  MV E velocity: 132.00 cm/s  Systemic Diam: 2.00 cm   Buford Dresser MD  Electronically signed by Buford Dresser MD  Signature Date/Time: 11/10/2020/11:23:04 AM          Final       Physicians   Panel Physicians Referring Physician Case Authorizing Physician  Burnell Blanks, MD (Primary)        Procedures   RIGHT/LEFT HEART CATH AND CORONARY/GRAFT ANGIOGRAPHY    Conclusion       Prox RCA to Mid RCA lesion is 100% stenosed.   Origin to Prox Graft lesion before Acute Mrg  is 100% stenosed.   Origin to Prox Graft lesion is 100% stenosed.   Ost Cx to Prox Cx lesion is 70% stenosed.  Mid LAD lesion is 100% stenosed.   SVG graft was visualized by angiography.   SVG graft was visualized by angiography.   LIMA graft was visualized by angiography.   Severe three vessel CAD s/p 4V CABG with 1/4 patent bypass grafts Chronic total occlusion of the mid LAD. The mid and distal LAD fills from the patent LIMA graft.  Moderately severe proximal Circumflex stenosis. The vein graft to the Circumflex system is chronically occluded Chronic occlusion of the proximal RCA. The sequential vein graft to the RV marginal branch and PDA is chronically occluded. The distal RCA and branches fill from right to right and left to right collaterals.  Severe aortic stenosis (mean gradient 49.4 mmHg, Peak to peak gradient 58 mmHg, AVA 0.74 cm2)   Recommendations: I would favor medical management of her CAD. The proximal Circumflex stenoses are moderately severe however this is not critical. Will continue workup for TAVR.    Indications   Severe aortic stenosis [I35.0 (ICD-10-CM)]    Procedural Details   Technical Details Indication: Severe aortic stenosis. History of CAD with prior CABG. TAVR workup in progress.   Procedure: The risks, benefits, complications, treatment options, and expected outcomes were discussed with the patient. The patient and/or family concurred with the proposed plan, giving informed consent. The patient was brought to the cath lab after IV hydration was given. The patient was sedated with Versed and Fentanyl. The IV catheter present in  the right antecubital vein was changed for a 5 [place] sheath. Right heart catheterization performed with a balloon tipped catheter. The left wrist was prepped and draped in a sterile fashion. 1% lidocaine was used for local anesthesia. Using the modified Seldinger access technique, a 5 French sheath was placed in the left radial artery. 3 mg Verapamil was given through the sheath. 6000 units IV heparin was given. Standard diagnostic catheters were used to perform selective coronary angiography. All bypass grafts engaged with the JR4 catheter. The aortic valve was crossed with an AL-1 and  a straight wire. The sheath was removed from the left radial artery and a Terumo hemostasis band was applied at the arteriotomy site on the left wrist.      Estimated blood loss <50 mL.   During this procedure medications were administered to achieve and maintain moderate conscious sedation while the patient's heart rate, blood pressure, and oxygen saturation were continuously monitored and I was present face-to-face 100% of this time.    Medications (Filter: Administrations occurring from [phone] to 1048 on 12/01/20)  important  Continuous medications are totaled by the amount administered until 12/01/20 1048.    Heparin (Porcine) in NaCl 1000-0.9 UT/500ML-% SOLN (mL) Total volume:  500 mL Date/Time Rate/Dose/Volume Action    12/01/20 0958 500 mL Given      Heparin (Porcine) in NaCl 1000-0.9 UT/500ML-% SOLN (mL) Total volume:  500 mL Date/Time Rate/Dose/Volume Action    12/01/20 0959 500 mL Given      fentaNYL (SUBLIMAZE) injection (mcg) Total dose:  25 mcg Date/Time Rate/Dose/Volume Action    12/01/20 1009 25 mcg Given      midazolam (VERSED) injection (mg) Total dose:  1 mg Date/Time Rate/Dose/Volume Action    12/01/20 1010 1 mg Given      lidocaine (PF) (XYLOCAINE) 1 % injection (mL) Total volume:  3 mL Date/Time Rate/Dose/Volume Action    12/01/20 1013 1 mL Given    1018 2 mL Given       Radial Cocktail/Verapamil only (mL) Total volume:  10 mL Date/Time Rate/Dose/Volume Action    12/01/20 1023 10 mL Given      heparin sodium (porcine) injection (Units) Total dose:  6,000 Units Date/Time Rate/Dose/Volume Action    12/01/20 1025 6,000 Units Given      iohexol (OMNIPAQUE) 350 MG/ML injection (mL) Total volume:  65 mL Date/Time Rate/Dose/Volume Action    12/01/20 1043 65 mL Given      Sedation Time   Sedation Time Physician-1: 31 minutes 7 seconds Contrast   Medication Name Total Dose  iohexol (OMNIPAQUE) 350 MG/ML injection 65 mL    Radiation/Fluoro   Fluoro time: 8.5 (min) DAP: 31393 (mGycm2) Cumulative Air Kerma: 570 (mGy) Coronary Findings   Diagnostic Dominance: Right Left Anterior Descending  Vessel is large.  Mid LAD lesion is 100% stenosed. The lesion is chronically occluded.  Left Circumflex  Vessel is large.  Ost Cx to Prox Cx lesion is 70% stenosed.  Second Obtuse Marginal Branch  Vessel is large in size.  Right Coronary Artery  Prox RCA to Mid RCA lesion is 100% stenosed. The lesion is chronically occluded.  Third Right Posterolateral Branch  Collaterals  3rd RPL filled by collaterals from 1st Sept.     Sequential Saphenous Graft To Acute Mrg, RPDA  SVG graft was visualized by angiography.  Origin to Prox Graft lesion before Acute Mrg is 100% stenosed. The lesion is chronically occluded.  Saphenous Graft To 2nd Mrg  SVG graft was visualized by angiography.  Origin to Prox Graft lesion is 100% stenosed. The lesion is chronically occluded.  LIMA LIMA Graft To Dist LAD  LIMA graft was visualized by angiography.  Intervention   No interventions have been documented. Coronary Diagrams   Diagnostic Dominance: Right Intervention   Implants      No implant documentation for this case.    Syngo Images    Show images for CARDIAC CATHETERIZATION Images on Long Term Storage    Show images for [place], [place] to Procedure Log    Procedure Log    Hemo Data   Flowsheet Row Most Recent Value  Fick Cardiac Output 5.24 L/min  Fick Cardiac Output Index 2.41 (L/min)/BSA  Aortic Mean Gradient 49.42 mmHg  Aortic Peak Gradient 58 mmHg  Aortic Valve Area 0.74  Aortic Value Area Index 0.34 cm2/BSA  RA A Wave 7 mmHg  RA V Wave 11 mmHg  RA Mean 9 mmHg  RV Systolic Pressure 42 mmHg  RV Diastolic Pressure 5 mmHg  RV EDP 9 mmHg  PA Systolic Pressure 38 mmHg  PA Diastolic Pressure 14 mmHg  PA Mean 27 mmHg  PW A Wave 19 mmHg  PW V Wave 26 mmHg  PW Mean 18 mmHg  AO Systolic Pressure 99991111 mmHg  AO Diastolic Pressure 61 mmHg  AO Mean 82 mmHg  LV Systolic Pressure [id number] mmHg  LV Diastolic Pressure 10 mmHg  LV EDP 12 mmHg  AOp Systolic Pressure [id number] mmHg  AOp Diastolic Pressure 58 mmHg  AOp Mean Pressure 75 mmHg  LVp Systolic Pressure 123XX123 mmHg  LVp Diastolic Pressure 10 mmHg  LVp EDP Pressure 15 mmHg  QP/QS 1  TPVR Index 11.18 HRUI  TSVR Index 33.94 HRUI  PVR SVR Ratio 0.12  TPVR/TSVR Ratio 0.33      ADDENDUM REPORT: 12/08/2020 13:37   CLINICAL DATA:  Severe Aortic Stenosis.   EXAM: Cardiac TAVR CT   TECHNIQUE: A non-contrast, gated CT scan was obtained with axial slices of 3 mm through the heart for aortic  valve calcium scoring. A 120 kV retrospective, gated, contrast cardiac scan was obtained. Gantry rotation speed was 250 msecs and collimation was 0.6 mm. Nitroglycerin was not given. The 3D data set was reconstructed in 5% intervals of the 0-95% of the R-R cycle. Systolic and diastolic phases were analyzed on a dedicated workstation using MPR, MIP, and VRT modes. The patient received 100 cc of contrast.   FINDINGS: Image quality: Excellent.   Noise artifact is: Limited.   Valve Morphology: The aortic valve is tricuspid with severe diffuse calcifications. The leaflets demonstrate severely restricted movement in systole.   Aortic Valve Calcium score: 1200   Aortic annular dimension:   Phase  assessed: 250 ms   Annular area: 332 mm2   Annular perimeter: 66.3 mm   Max diameter: 24.0 mm   Min diameter: 18.6 mm   Annular and subannular calcification: None.   Optimal coplanar projection: LAO 33 CAU 8   Coronary Artery Height above Annulus:   Left Main: 11.0 mm   Right Coronary: 18.1 mm   Sinus of Valsalva Measurements:   Non-coronary: 27 mm   Right-coronary: 28 mm   Left-coronary: 31 mm   Average Diameter: 29 mm   Sinus of Valsalva Height:   Non-coronary: 15.9 mm   Right-coronary: 21.3 mm   Left-coronary: 17.1 mm   Sinotubular Junction: 27 mm.  Mild calcification.   Ascending Thoracic Aorta: 31 mm   Coronary Arteries: Normal coronary origin. Right dominance. The study was performed without use of NTG and is insufficient for plaque evaluation. Please refer to recent cardiac catheterization for coronary assessment. Patent LIMA-LAD. Occluded SVG-RCA and likely OM noted. Severe 3-vessel CAD.   Cardiac Morphology:   Right Atrium: Right atrial size is within normal limits.   Right Ventricle: The right ventricular cavity is within normal limits.   Left Atrium: Left atrial size is normal in size. There is a filling defect in the left atrial appendage likely consistent with contrast mixing artifact.   Left Ventricle: The ventricular cavity size is within normal limits. There are no stigmata of prior infarction. There is no abnormal filling defect. Normal LVEF, 64%. Postoperative septal movement.   Pulmonary arteries: Normal in size without proximal filling defect.   Pulmonary veins: Normal pulmonary venous drainage.   Pericardium: Normal thickness with no significant effusion or calcium present.   Mitral Valve: The mitral valve is normal structure with moderate mitral annular calcification.   Extra-cardiac findings: See attached radiology report for non-cardiac structures.   IMPRESSION: 1. Tricuspid aortic valve with severe aortic  stenosis.   2. Small annular dimensions (332 mm2) but anatomy favorable for 26 mm Evolut Pro.   3. No significant annular or subannular calcifications.   4. Sufficient coronary to annulus distance.   5. Optimal Fluoroscopic Angle for Delivery: LAO 33 CAU 8   6. There is a filling defect in the left atrial appendage likely consistent with contrast mixing artifact.   7. Moderate mitral annular calcification.   [place]ells T. Audie Box, MD     Electronically Signed   By: Eleonore Chiquito   On: 12/08/2020 13:37     Narrative & Impression  CLINICAL DATA:  87 year old female with history of severe aortic stenosis. Preprocedural study prior to potential transcatheter aortic valve replacement (TAVR) procedure.   EXAM: CT ANGIOGRAPHY CHEST, ABDOMEN AND PELVIS   TECHNIQUE: Multidetector CT imaging through the chest, abdomen and pelvis was performed using the standard protocol during bolus administration of intravenous contrast. Multiplanar reconstructed images and  MIPs were obtained and reviewed to evaluate the vascular anatomy.   CONTRAST:  169m OMNIPAQUE IOHEXOL 350 MG/ML SOLN   COMPARISON:  No priors.   FINDINGS: CTA CHEST FINDINGS   Cardiovascular: Heart size is moderately enlarged. There is no significant pericardial fluid, thickening or pericardial calcification. There is aortic atherosclerosis, as well as atherosclerosis of the great vessels of the mediastinum and the coronary arteries, including calcified atherosclerotic plaque in the left anterior descending, left circumflex and right coronary arteries. Status post median sternotomy for CABG including LIMA to the LAD. Thickening calcification of the aortic valve. Calcifications of the mitral valve and annulus.   Mediastinum/Lymph Nodes: No pathologically enlarged mediastinal or hilar lymph nodes. Esophagus is unremarkable in appearance. No axillary lymphadenopathy.   Lungs/Pleura: In the left lower lobe (axial image  61 of series 4) there is a 1.8 x 1.7 cm pulmonary nodule which has a central core of dense calcification, likely to represent a partially calcified granuloma or hamartoma. No other definite suspicious appearing pulmonary nodules or masses are noted. No acute consolidative airspace disease. No pleural effusions. Dependent areas of subsegmental atelectasis are noted in the lower lobes of the lungs bilaterally.   Musculoskeletal/Soft Tissues: There are no aggressive appearing lytic or blastic lesions noted in the visualized portions of the skeleton. Median sternotomy wires. Status post right shoulder arthroplasty.   CTA ABDOMEN AND PELVIS FINDINGS   Hepatobiliary: Subcentimeter low-attenuation lesion between segments 2 and 3 in the liver, too small to characterize, but statistically likely to represent a tiny cyst. No other suspicious hepatic lesions. No intra or extrahepatic biliary ductal dilatation. Numerous peripherally calcified gallstones are noted in the gallbladder measuring up to 2.1 cm in diameter. Gallbladder is only moderately distended. No gallbladder wall thickening or surrounding inflammatory changes are noted.   Pancreas: No pancreatic mass. No pancreatic ductal dilatation. No pancreatic or peripancreatic fluid collections or inflammatory changes.   Spleen: Unremarkable.   Adrenals/Urinary Tract: Bilateral kidneys and adrenal glands are normal in appearance. No hydroureteronephrosis. Urinary bladder is normal in appearance.   Stomach/Bowel: The appearance of the stomach is normal. No pathologic dilatation of small bowel or colon. Numerous colonic diverticulae are noted, without surrounding inflammatory changes to suggest an acute diverticulitis at this time. Normal appendix.   Vascular/Lymphatic: Aortic atherosclerosis, without evidence of aneurysm or dissection in the abdominal or pelvic vasculature. No lymphadenopathy noted in the abdomen or pelvis.    Reproductive: Uterus and ovaries are unremarkable in appearance.   Other: No significant volume of ascites.  No pneumoperitoneum.   Musculoskeletal: There are no aggressive appearing lytic or blastic lesions noted in the visualized portions of the skeleton.   VASCULAR MEASUREMENTS PERTINENT TO TAVR:   AORTA:   Minimal Aortic Diameter-15 x 13 mm   Severity of Aortic Calcification-moderate   RIGHT PELVIS:   Right Common Iliac Artery -   Minimal Diameter-8.9 x 8.6 mm   Tortuosity-moderate to severe   Calcification-mild   Right External Iliac Artery -   Minimal Diameter-7.4 x 7.8 mm   Tortuosity-severe   Calcification-none   Right Common Femoral Artery -   Minimal Diameter-7.9 x 6.7 mm   Tortuosity-mild   Calcification-mild   LEFT PELVIS:   Left Common Iliac Artery -   Minimal Diameter-6.3 x 5.6 mm   Tortuosity-severe   Calcification-moderate   Left External Iliac Artery -   Minimal Diameter-7.1 x 6.9 mm   Tortuosity-moderate   Calcification-none   Left Common Femoral Artery -   Minimal  Diameter-7.7 x 6.4 mm   Tortuosity-mild   Calcification-mild   Review of the MIP images confirms the above findings.   IMPRESSION: 1. Vascular findings and measurements pertinent to potential TAVR procedure, as detailed above. 2. Thickening calcification of the aortic valve, compatible with reported clinical history of severe aortic stenosis. 3. Aortic atherosclerosis, in addition to 3 vessel coronary artery disease. Status post median sternotomy for CABG including LIMA to the LAD. 4. Moderate cardiomegaly. 5. Cholelithiasis without evidence of acute cholecystitis at this time. 6. Colonic diverticulosis without evidence of acute diverticulitis at this time. 7. Additional incidental findings, as above.     Electronically Signed   By: Vinnie Langton M.D.   On: 12/07/2020 14:17        STS RISK CALCULATOR:   Isolated AVR: Risk of  Mortality: 6.064% Renal Failure: 3.257% Permanent Stroke: 1.757% Prolonged Ventilation: 13.715% DSW Infection: 0.398% Reoperation: 2.891% Morbidity or Mortality: 16.850% Short Length of Stay: 13.045% Long Length of Stay: 11.135%   Impression:   This 87 year old woman has stage D, severe, symptomatic aortic stenosis with New York Heart Association class III symptoms of exertional fatigue and shortness of breath with minimal activity consistent with chronic diastolic congestive heart failure.  She has also had exertional chest pressure, dizziness, and orthopnea.  I have personally reviewed her 2D echocardiogram, cardiac catheterization, and CTA studies.  Her echocardiogram shows severe aortic stenosis with a mean gradient of 60 mmHg and an aortic valve area of 0.48 cm.  Left ventricular systolic function is normal with severe concentric LVH and diastolic dysfunction.  Cardiac catheterization shows severe native three-vessel coronary disease with a patent left internal mammary graft to the LAD which is chronically occluded in the midportion.  The previously placed vein grafts are occluded as is the proximal RCA.  It fills distally from collaterals.  There is 70% ostial to proximal left circumflex stenosis.  I agree that aortic valve replacement is indicated in this patient for relief of her symptoms and to prevent progressive left ventricular deterioration.  I think her coronary disease can be treated medically.  I do not think she is a candidate for open surgical aortic valve replacement and coronary bypass surgery due to her advanced age, previous coronary bypass surgery, and multiple comorbidities with limited mobility.  I think transcatheter aortic valve replacement is a reasonable alternative.  Her gated cardiac CTA shows anatomy suitable for TAVR although her annular area is relatively small and would only be suitable for a 20 mm Edwards SAPIEN valve.  Her dimensions appear suitable for a 26  mm Medtronic Evolut Pro+ valve which would be a better choice given her BSA of 2.22.  Her abdominal and pelvic CTA shows adequate pelvic vascular anatomy to allow transfemoral insertion. She was seen in the office by KT, PA-c at the end of last week and was found to have significant rash in both groins suggesting candida. She was started on treatment and reexamined today but still had significant rash and with her panniculus we felt that proceeding with left subclavian access would probably be better.   The patient and her husband were counseled at length regarding treatment alternatives for management of severe symptomatic aortic stenosis. The risks and benefits of surgical intervention has been discussed in detail. Long-term prognosis with medical therapy was discussed. Alternative approaches such as conventional surgical aortic valve replacement, transcatheter aortic valve replacement, and palliative medical therapy were compared and contrasted at length. This discussion was placed in the context of  the patient's own specific clinical presentation and past medical history. All of their questions have been addressed.    Following the decision to proceed with transcatheter aortic valve replacement, a discussion was held regarding what types of management strategies would be attempted intraoperatively in the event of life-threatening complications, including whether or not the patient would be considered a candidate for the use of cardiopulmonary bypass and/or conversion to open sternotomy for attempted surgical intervention.  I do not think she is a candidate for emergent sternotomy to manage any intraoperative complications.  The patient is aware of the fact that transient use of cardiopulmonary bypass may be necessary. The patient has been advised of a variety of complications that might develop including but not limited to risks of death, stroke, paravalvular leak, aortic dissection or other major vascular  complications, aortic annulus rupture, device embolization, cardiac rupture or perforation, mitral regurgitation, acute myocardial infarction, arrhythmia, heart block or bradycardia requiring permanent pacemaker placement, congestive heart failure, respiratory failure, renal failure, pneumonia, infection, other late complications related to structural valve deterioration or migration, or other complications that might ultimately cause a temporary or permanent loss of functional independence or other long term morbidity. The patient provides full informed consent for the procedure as described and all questions were answered.       Plan:   Left subclavian transcatheter aortic valve replacement using a Medtronic Evolut valve.    Gaye Pollack, MD

## 2021-01-09 ENCOUNTER — Inpatient Hospital Stay (HOSPITAL_COMMUNITY)
Admission: RE | Admit: 2021-01-09 | Discharge: 2021-01-11 | DRG: 267 | Disposition: A | Discharge status: Home / Self Care | Payer: Medicare Other | Attending: Cardiovascular Disease | Admitting: Cardiovascular Disease

## 2021-01-09 ENCOUNTER — Encounter (HOSPITAL_COMMUNITY): Payer: Self-pay | Admitting: Cardiovascular Disease

## 2021-01-09 ENCOUNTER — Inpatient Hospital Stay (HOSPITAL_COMMUNITY): Payer: Medicare Other

## 2021-01-09 ENCOUNTER — Other Ambulatory Visit: Payer: Self-pay

## 2021-01-09 ENCOUNTER — Other Ambulatory Visit (HOSPITAL_COMMUNITY): Payer: Medicare Other

## 2021-01-09 ENCOUNTER — Inpatient Hospital Stay (HOSPITAL_COMMUNITY): Payer: Medicare Other | Admitting: Anesthesiology

## 2021-01-09 ENCOUNTER — Inpatient Hospital Stay (HOSPITAL_COMMUNITY): Payer: Medicare Other | Admitting: Vascular Surgery

## 2021-01-09 ENCOUNTER — Encounter (HOSPITAL_COMMUNITY)
Admission: RE | Disposition: A | Discharge status: Home / Self Care | Payer: Medicare Other | Source: Home / Self Care | Attending: Cardiovascular Disease

## 2021-01-09 DIAGNOSIS — I779 Disorder of arteries and arterioles, unspecified: Secondary | ICD-10-CM | POA: Diagnosis present

## 2021-01-09 DIAGNOSIS — I447 Left bundle-branch block, unspecified: Secondary | ICD-10-CM | POA: Diagnosis not present

## 2021-01-09 DIAGNOSIS — I2582 Chronic total occlusion of coronary artery: Secondary | ICD-10-CM | POA: Diagnosis present

## 2021-01-09 DIAGNOSIS — I252 Old myocardial infarction: Secondary | ICD-10-CM

## 2021-01-09 DIAGNOSIS — Z006 Encounter for examination for normal comparison and control in clinical research program: Secondary | ICD-10-CM

## 2021-01-09 DIAGNOSIS — R2689 Other abnormalities of gait and mobility: Secondary | ICD-10-CM | POA: Diagnosis present

## 2021-01-09 DIAGNOSIS — Z7901 Long term (current) use of anticoagulants: Secondary | ICD-10-CM

## 2021-01-09 DIAGNOSIS — I25810 Atherosclerosis of coronary artery bypass graft(s) without angina pectoris: Secondary | ICD-10-CM | POA: Diagnosis present

## 2021-01-09 DIAGNOSIS — I350 Nonrheumatic aortic (valve) stenosis: Principal | ICD-10-CM | POA: Diagnosis present

## 2021-01-09 DIAGNOSIS — Z20822 Contact with and (suspected) exposure to covid-19: Secondary | ICD-10-CM | POA: Diagnosis present

## 2021-01-09 DIAGNOSIS — Z79899 Other long term (current) drug therapy: Secondary | ICD-10-CM | POA: Diagnosis not present

## 2021-01-09 DIAGNOSIS — I498 Other specified cardiac arrhythmias: Secondary | ICD-10-CM | POA: Diagnosis not present

## 2021-01-09 DIAGNOSIS — Z6836 Body mass index (BMI) 36.0-36.9, adult: Secondary | ICD-10-CM | POA: Diagnosis not present

## 2021-01-09 DIAGNOSIS — I4819 Other persistent atrial fibrillation: Secondary | ICD-10-CM | POA: Diagnosis present

## 2021-01-09 DIAGNOSIS — Z882 Allergy status to sulfonamides status: Secondary | ICD-10-CM

## 2021-01-09 DIAGNOSIS — I10 Essential (primary) hypertension: Secondary | ICD-10-CM | POA: Diagnosis present

## 2021-01-09 DIAGNOSIS — I517 Cardiomegaly: Secondary | ICD-10-CM | POA: Diagnosis not present

## 2021-01-09 DIAGNOSIS — Z952 Presence of prosthetic heart valve: Secondary | ICD-10-CM

## 2021-01-09 DIAGNOSIS — Z885 Allergy status to narcotic agent status: Secondary | ICD-10-CM

## 2021-01-09 DIAGNOSIS — G4733 Obstructive sleep apnea (adult) (pediatric): Secondary | ICD-10-CM | POA: Diagnosis present

## 2021-01-09 DIAGNOSIS — D649 Anemia, unspecified: Secondary | ICD-10-CM | POA: Diagnosis present

## 2021-01-09 DIAGNOSIS — Z87442 Personal history of urinary calculi: Secondary | ICD-10-CM | POA: Diagnosis not present

## 2021-01-09 DIAGNOSIS — J45909 Unspecified asthma, uncomplicated: Secondary | ICD-10-CM | POA: Diagnosis present

## 2021-01-09 DIAGNOSIS — Z91012 Allergy to eggs: Secondary | ICD-10-CM

## 2021-01-09 DIAGNOSIS — I2510 Atherosclerotic heart disease of native coronary artery without angina pectoris: Secondary | ICD-10-CM | POA: Diagnosis present

## 2021-01-09 DIAGNOSIS — Z8249 Family history of ischemic heart disease and other diseases of the circulatory system: Secondary | ICD-10-CM

## 2021-01-09 DIAGNOSIS — M1990 Unspecified osteoarthritis, unspecified site: Secondary | ICD-10-CM | POA: Diagnosis present

## 2021-01-09 DIAGNOSIS — Z888 Allergy status to other drugs, medicaments and biological substances status: Secondary | ICD-10-CM

## 2021-01-09 DIAGNOSIS — E785 Hyperlipidemia, unspecified: Secondary | ICD-10-CM | POA: Diagnosis present

## 2021-01-09 DIAGNOSIS — Z954 Presence of other heart-valve replacement: Secondary | ICD-10-CM | POA: Diagnosis not present

## 2021-01-09 DIAGNOSIS — R001 Bradycardia, unspecified: Secondary | ICD-10-CM | POA: Diagnosis not present

## 2021-01-09 DIAGNOSIS — Z9181 History of falling: Secondary | ICD-10-CM | POA: Diagnosis not present

## 2021-01-09 DIAGNOSIS — I739 Peripheral vascular disease, unspecified: Secondary | ICD-10-CM | POA: Diagnosis present

## 2021-01-09 DIAGNOSIS — Z880 Allergy status to penicillin: Secondary | ICD-10-CM

## 2021-01-09 DIAGNOSIS — K219 Gastro-esophageal reflux disease without esophagitis: Secondary | ICD-10-CM | POA: Diagnosis not present

## 2021-01-09 DIAGNOSIS — R0902 Hypoxemia: Secondary | ICD-10-CM

## 2021-01-09 HISTORY — DX: Morbid (severe) obesity due to excess calories: E66.01

## 2021-01-09 HISTORY — PX: TEE WITHOUT CARDIOVERSION: SHX5443

## 2021-01-09 HISTORY — DX: Presence of prosthetic heart valve: Z95.2

## 2021-01-09 HISTORY — DX: Other persistent atrial fibrillation: I48.19

## 2021-01-09 LAB — POCT I-STAT, CHEM 8
BUN: 34 mg/dL — ABNORMAL HIGH (ref 8–23)
Calcium, Ion: 1.19 mmol/L (ref 1.15–1.40)
Chloride: 104 mmol/L (ref 98–111)
Creatinine, Ser: 0.8 mg/dL (ref 0.44–1.00)
Glucose, Bld: 165 mg/dL — ABNORMAL HIGH (ref 70–99)
HCT: 39 % (ref 36.0–46.0)
Hemoglobin: 13.3 g/dL (ref 12.0–15.0)
Potassium: 5.2 mmol/L — ABNORMAL HIGH (ref 3.5–5.1)
Sodium: 138 mmol/L (ref 135–145)
TCO2: 28 mmol/L (ref 22–32)

## 2021-01-09 LAB — POCT ACTIVATED CLOTTING TIME
Activated Clotting Time: 140 seconds
Activated Clotting Time: 336 seconds

## 2021-01-09 LAB — ECHO TEE
AR max vel: 1.31 cm2
AV Area VTI: 1.14 cm2
AV Area mean vel: 1.2 cm2
AV Mean grad: 28 mmHg
AV Peak grad: 35.6 mmHg
Ao pk vel: 2.98 m/s

## 2021-01-09 SURGERY — IMPLANTATION, AORTIC VALVE, TRANSCATHETER, SUBCLAVIAN ARTERY APPROACH
Anesthesia: General

## 2021-01-09 MED ORDER — ACETAMINOPHEN 500 MG PO TABS
1000.0000 mg | ORAL_TABLET | Freq: Once | ORAL | Status: AC
  Administered 2021-01-09: 1000 mg via ORAL
  Filled 2021-01-09: qty 2

## 2021-01-09 MED ORDER — SODIUM CHLORIDE 0.9 % IV SOLN: 250.0000 mL | INTRAVENOUS | Status: DC | PRN

## 2021-01-09 MED ORDER — EPHEDRINE SULFATE-NACL 50-0.9 MG/10ML-% IV SOSY
PREFILLED_SYRINGE | INTRAVENOUS | Status: DC | PRN
  Administered 2021-01-09: 10 mg via INTRAVENOUS

## 2021-01-09 MED ORDER — FENTANYL CITRATE (PF) 250 MCG/5ML IJ SOLN
INTRAMUSCULAR | Status: DC | PRN
  Administered 2021-01-09 (×2): 50 ug via INTRAVENOUS

## 2021-01-09 MED ORDER — ATENOLOL-CHLORTHALIDONE 100-25 MG PO TABS: 1.0000 | ORAL_TABLET | Freq: Every day | ORAL | Status: DC

## 2021-01-09 MED ORDER — PROPOFOL 10 MG/ML IV BOLUS
INTRAVENOUS | Status: DC | PRN
  Administered 2021-01-09: 30 mg via INTRAVENOUS
  Administered 2021-01-09: 70 mg via INTRAVENOUS

## 2021-01-09 MED ORDER — VANCOMYCIN HCL IN DEXTROSE 1-5 GM/200ML-% IV SOLN
1000.0000 mg | Freq: Once | INTRAVENOUS | Status: AC
  Administered 2021-01-09: 1000 mg via INTRAVENOUS
  Filled 2021-01-09: qty 200

## 2021-01-09 MED ORDER — CHLORHEXIDINE GLUCONATE 0.12 % MT SOLN
OROMUCOSAL | Status: AC
  Administered 2021-01-09: 15 mL via OROMUCOSAL
  Filled 2021-01-09: qty 15

## 2021-01-09 MED ORDER — CHLORHEXIDINE GLUCONATE 4 % EX LIQD: 60.0000 mL | Freq: Once | CUTANEOUS | Status: DC

## 2021-01-09 MED ORDER — FLUCONAZOLE 200 MG PO TABS
200.0000 mg | ORAL_TABLET | Freq: Every day | ORAL | Status: DC
  Administered 2021-01-09 – 2021-01-11 (×3): 200 mg via ORAL
  Filled 2021-01-09: qty 2
  Filled 2021-01-09 (×3): qty 1
  Filled 2021-01-09: qty 2

## 2021-01-09 MED ORDER — SODIUM CHLORIDE 0.9% FLUSH
3.0000 mL | Freq: Two times a day (BID) | INTRAVENOUS | Status: DC
  Administered 2021-01-10 (×2): 3 mL via INTRAVENOUS

## 2021-01-09 MED ORDER — CHLORTHALIDONE 25 MG PO TABS
25.0000 mg | ORAL_TABLET | Freq: Every day | ORAL | Status: DC
  Administered 2021-01-09 – 2021-01-11 (×3): 25 mg via ORAL
  Filled 2021-01-09 (×3): qty 1

## 2021-01-09 MED ORDER — FLUTICASONE FUROATE-VILANTEROL 100-25 MCG/INH IN AEPB
1.0000 | INHALATION_SPRAY | Freq: Every day | RESPIRATORY_TRACT | Status: DC
  Administered 2021-01-10: 1 via RESPIRATORY_TRACT
  Filled 2021-01-09: qty 28

## 2021-01-09 MED ORDER — SUGAMMADEX SODIUM 200 MG/2ML IV SOLN
INTRAVENOUS | Status: DC | PRN
  Administered 2021-01-09: 200 mg via INTRAVENOUS

## 2021-01-09 MED ORDER — SODIUM CHLORIDE 0.9 % IV SOLN: INTRAVENOUS | Status: AC

## 2021-01-09 MED ORDER — PROTAMINE SULFATE 10 MG/ML IV SOLN
INTRAVENOUS | Status: DC | PRN
Start: 2021-01-09 — End: 2021-01-09
  Administered 2021-01-09: 150 mg via INTRAVENOUS

## 2021-01-09 MED ORDER — NITROGLYCERIN IN D5W 200-5 MCG/ML-% IV SOLN: 0.0000 ug/min | INTRAVENOUS | Status: DC

## 2021-01-09 MED ORDER — FENTANYL CITRATE (PF) 100 MCG/2ML IJ SOLN: 25.0000 ug | INTRAMUSCULAR | Status: DC | PRN

## 2021-01-09 MED ORDER — LIDOCAINE 2% (20 MG/ML) 5 ML SYRINGE
INTRAMUSCULAR | Status: DC | PRN
Start: 2021-01-09 — End: 2021-01-09
  Administered 2021-01-09: 60 mg via INTRAVENOUS

## 2021-01-09 MED ORDER — ROCURONIUM BROMIDE 10 MG/ML (PF) SYRINGE
PREFILLED_SYRINGE | INTRAVENOUS | Status: DC | PRN
  Administered 2021-01-09: 50 mg via INTRAVENOUS
  Administered 2021-01-09: 10 mg via INTRAVENOUS
  Administered 2021-01-09: 50 mg via INTRAVENOUS

## 2021-01-09 MED ORDER — PHENYLEPHRINE HCL-NACL 20-0.9 MG/250ML-% IV SOLN
INTRAVENOUS | Status: DC | PRN
  Administered 2021-01-09: 25 ug/min via INTRAVENOUS

## 2021-01-09 MED ORDER — ONDANSETRON HCL 4 MG/2ML IJ SOLN
INTRAMUSCULAR | Status: DC | PRN
Start: 2021-01-09 — End: 2021-01-09
  Administered 2021-01-09 (×2): 4 mg via INTRAVENOUS

## 2021-01-09 MED ORDER — SODIUM CHLORIDE 0.9 % IV SOLN: INTRAVENOUS | Status: DC

## 2021-01-09 MED ORDER — CHLORHEXIDINE GLUCONATE 4 % EX LIQD: 30.0000 mL | CUTANEOUS | Status: DC

## 2021-01-09 MED ORDER — ATENOLOL 25 MG PO TABS
100.0000 mg | ORAL_TABLET | Freq: Every day | ORAL | Status: DC
  Administered 2021-01-09: 100 mg via ORAL
  Filled 2021-01-09: qty 4

## 2021-01-09 MED ORDER — METOPROLOL TARTRATE 5 MG/5ML IV SOLN
2.5000 mg | INTRAVENOUS | Status: DC | PRN
Start: 2021-01-09 — End: 2021-01-11

## 2021-01-09 MED ORDER — LORATADINE 10 MG PO TABS
10.0000 mg | ORAL_TABLET | Freq: Every day | ORAL | Status: DC
  Administered 2021-01-10 – 2021-01-11 (×2): 10 mg via ORAL
  Filled 2021-01-09 (×2): qty 1

## 2021-01-09 MED ORDER — SODIUM CHLORIDE 0.9% FLUSH: 3.0000 mL | INTRAVENOUS | Status: DC | PRN

## 2021-01-09 MED ORDER — PHENYLEPHRINE HCL-NACL 20-0.9 MG/250ML-% IV SOLN: 0.0000 ug/min | INTRAVENOUS | Status: DC

## 2021-01-09 MED ORDER — ATORVASTATIN CALCIUM 10 MG PO TABS
10.0000 mg | ORAL_TABLET | Freq: Every day | ORAL | Status: DC
  Administered 2021-01-09 – 2021-01-11 (×3): 10 mg via ORAL
  Filled 2021-01-09 (×3): qty 1

## 2021-01-09 MED ORDER — ASPIRIN 81 MG PO CHEW
81.0000 mg | CHEWABLE_TABLET | Freq: Every day | ORAL | Status: DC
  Administered 2021-01-10 – 2021-01-11 (×2): 81 mg via ORAL
  Filled 2021-01-09 (×2): qty 1

## 2021-01-09 MED ORDER — DEXAMETHASONE SODIUM PHOSPHATE 10 MG/ML IJ SOLN
INTRAMUSCULAR | Status: DC | PRN
  Administered 2021-01-09: 4 mg via INTRAVENOUS

## 2021-01-09 MED ORDER — SODIUM CHLORIDE 0.9 % IV SOLN: 250.0000 mL | INTRAVENOUS | Status: DC

## 2021-01-09 MED ORDER — HEPARIN (PORCINE) IN NACL 1000-0.9 UT/500ML-% IV SOLN
INTRAVENOUS | Status: AC
  Filled 2021-01-09: qty 500

## 2021-01-09 MED ORDER — BISACODYL 5 MG PO TBEC: 5.0000 mg | DELAYED_RELEASE_TABLET | Freq: Every day | ORAL | Status: DC | PRN

## 2021-01-09 MED ORDER — MECLIZINE HCL 25 MG PO TABS
25.0000 mg | ORAL_TABLET | Freq: Every day | ORAL | Status: DC | PRN
  Filled 2021-01-09: qty 1

## 2021-01-09 MED ORDER — CHLORHEXIDINE GLUCONATE 0.12 % MT SOLN
15.0000 mL | Freq: Once | OROMUCOSAL | Status: AC
  Filled 2021-01-09: qty 15

## 2021-01-09 MED ORDER — LACTATED RINGERS IV SOLN: INTRAVENOUS | Status: DC

## 2021-01-09 MED ORDER — HEPARIN SODIUM (PORCINE) 1000 UNIT/ML IJ SOLN
INTRAMUSCULAR | Status: DC | PRN
  Administered 2021-01-09: 15000 [IU] via INTRAVENOUS
  Administered 2021-01-09: 3000 [IU] via INTRAVENOUS

## 2021-01-09 MED ORDER — HEPARIN (PORCINE) IN NACL 1000-0.9 UT/500ML-% IV SOLN
INTRAVENOUS | Status: AC
  Filled 2021-01-09: qty 1000

## 2021-01-09 MED ORDER — IOHEXOL 350 MG/ML SOLN
INTRAVENOUS | Status: DC | PRN
Start: 2021-01-09 — End: 2021-01-09
  Administered 2021-01-09: 82 mL via INTRA_ARTERIAL

## 2021-01-09 MED ORDER — ONDANSETRON HCL 4 MG/2ML IJ SOLN: 4.0000 mg | Freq: Four times a day (QID) | INTRAMUSCULAR | Status: DC | PRN

## 2021-01-09 MED ORDER — LIDOCAINE HCL (PF) 1 % IJ SOLN
INTRAMUSCULAR | Status: AC
  Filled 2021-01-09: qty 30

## 2021-01-09 MED ORDER — HEPARIN (PORCINE) IN NACL 1000-0.9 UT/500ML-% IV SOLN
INTRAVENOUS | Status: DC | PRN
Start: 2021-01-09 — End: 2021-01-09
  Administered 2021-01-09 (×2): 500 mL

## 2021-01-09 MED ORDER — ACETAMINOPHEN 500 MG PO TABS
1000.0000 mg | ORAL_TABLET | Freq: Four times a day (QID) | ORAL | Status: DC | PRN
  Administered 2021-01-10: 1000 mg via ORAL
  Filled 2021-01-09: qty 2

## 2021-01-09 SURGICAL SUPPLY — 35 items
BALLN TRUE 20X4.5 (BALLOONS) ×3
BALLN TRUE 20X4.5CM (BALLOONS) ×1
BALLOON TRUE 20X4.5 (BALLOONS) IMPLANT
CABLE ADAPT PACING TEMP 12FT (ADAPTER) ×2 IMPLANT
CATH DIAG 6FR PIGTAIL (CATHETERS) ×2 IMPLANT
CATH DIAG 6FR PIGTAIL ANGLED (CATHETERS) ×2 IMPLANT
CATH INFINITI 6F AL1 (CATHETERS) ×2 IMPLANT
CATH S G BIP PACING (CATHETERS) ×2 IMPLANT
CLOSURE MYNX CONTROL 6F/7F (Vascular Products) ×3 IMPLANT
ELECT DEFIB PAD ADLT CADENCE (PAD) ×3 IMPLANT
GUIDEWIRE CNFDA BRKR CVD (WIRE) ×3 IMPLANT
GUIDEWIRE SAF TJ AMPL .035X180 (WIRE) ×3 IMPLANT
KIT HEART LEFT (KITS) ×3 IMPLANT
MAT PREVALON FULL STRYKER (MISCELLANEOUS) ×3 IMPLANT
PACK CARDIAC CATHETERIZATION (CUSTOM PROCEDURE TRAY) ×3 IMPLANT
SHEATH BRITE TIP 7FR 35CM (SHEATH) ×3 IMPLANT
SHEATH FAST CATH 14F (SHEATH) ×2 IMPLANT
SHEATH PERFORMER 16FR 30 (SHEATH) ×2 IMPLANT
SHEATH PINNACLE 6F 10CM (SHEATH) ×3 IMPLANT
SHEATH PINNACLE 8F 10CM (SHEATH) ×2 IMPLANT
SHEATH PROBE COVER 6X72 (BAG) ×2 IMPLANT
SHIELD RADPAD SCOOP 12X17 (MISCELLANEOUS) ×3 IMPLANT
SLEEVE REPOSITIONING LENGTH 30 (MISCELLANEOUS) ×2 IMPLANT
SYR MEDRAD MARK V 150ML (SYRINGE) ×3 IMPLANT
SYS DEL EVOLUT PROPLS 23 26 29 (CATHETERS) ×4
SYS LOAD EVOLT PROPLS 23 26 29 (CATHETERS) ×4
SYSTEM DEL EVLT PRPLS 23 26 29 (CATHETERS) ×1 IMPLANT
SYSTEM LOAD EVLT PRPLS23 26 29 (CATHETERS) IMPLANT
TUBING ART PRESS 72  MALE/FEM (TUBING) ×4
TUBING ART PRESS 72 MALE/FEM (TUBING) ×1 IMPLANT
TUBING CONTRAST HIGH PRESS 48 (TUBING) ×3 IMPLANT
VALVE AORTIC EVOLUT PROPLUS 26 (Valve) ×3 IMPLANT
WIRE EMERALD 3MM-J .035X150CM (WIRE) ×3 IMPLANT
WIRE EMERALD 3MM-J .035X260CM (WIRE) ×2 IMPLANT
WIRE EMERALD ST .035X260CM (WIRE) ×2 IMPLANT

## 2021-01-09 NOTE — Op Note (Signed)
[facility]   MULTIDISCIPLINARY HEART VALVE TEAM   TAVR OPERATIVE NOTE   Date of Procedure:  01/09/2021   Preoperative Diagnosis: Severe Aortic Stenosis   Postoperative Diagnosis: Same   Procedure:   Transcatheter Aortic Valve Replacement - L subclavian Approach  Medtronic Evolut Pro-Plus (size 26 mm, serial # [id number])   Co-Surgeons:  Gaye Pollack, MD and Sherren Mocha, MD  Anesthesiologist:  Loralie Champagne, MD  Echocardiographer:  Jenkins Rouge, MD  Pre-operative Echo Findings: Severe aortic stenosis Normal left ventricular systolic function  Post-operative Echo Findings: Mild paravalvular leak Normal left ventricular systolic function  BRIEF CLINICAL NOTE AND INDICATIONS FOR SURGERY  87 yo morbidly obese woman who has developed severe symptomatic aortic stenosis (Stage D1) disease with progressive symptoms of NYHA 3b dyspnea now with minimal activity. She has undergone multidisciplinary heart team evaluation and presents for TAVR via left subclavian access.   During the course of the patient's preoperative work up they have been evaluated comprehensively by a multidisciplinary team of specialists coordinated through the [facility] in the [facility].  They have been demonstrated to suffer from symptomatic severe aortic stenosis as noted above. The patient has been counseled extensively as to the relative risks and benefits of all options for the treatment of severe aortic stenosis including long term medical therapy, conventional surgery for aortic valve replacement, and transcatheter aortic valve replacement.  The patient has been independently evaluated in formal cardiac surgical consultation by Dr Dr Cyndia Bent, who deemed the patient appropriate for TAVR. Based upon review of all of the patient's preoperative diagnostic tests they are felt to be candidate for transcatheter aortic valve replacement using  the transfemoral approach as an alternative to conventional surgery.    Following the decision to proceed with transcatheter aortic valve replacement, a discussion has been held regarding what types of management strategies would be attempted intraoperatively in the event of life-threatening complications, including whether or not the patient would be considered a candidate for the use of cardiopulmonary bypass and/or conversion to open sternotomy for attempted surgical intervention.  The patient has been advised of a variety of complications that might develop peculiar to this approach including but not limited to risks of death, stroke, paravalvular leak, aortic dissection or other major vascular complications, aortic annulus rupture, device embolization, cardiac rupture or perforation, acute myocardial infarction, arrhythmia, heart block or bradycardia requiring permanent pacemaker placement, congestive heart failure, respiratory failure, renal failure, pneumonia, infection, other late complications related to structural valve deterioration or migration, or other complications that might ultimately cause a temporary or permanent loss of functional independence or other long term morbidity.  The patient provides full informed consent for the procedure as described and all questions were answered preoperatively.  DETAILS OF THE OPERATIVE PROCEDURE  PREPARATION:   The patient is brought to the operating room on the above mentioned date and central monitoring was established by the anesthesia team including placement of a radial arterial line. The patient is placed in the supine position on the operating table.  Intravenous antibiotics are administered.  General endotracheal anesthesia is induced uneventfully.  Baseline transesophageal echocardiogram is performed. The patient's chest, abdomen, both groins, and both lower extremities are prepared and draped in a sterile manner. A time out procedure is  performed.   PERIPHERAL ACCESS:   Using ultrasound guidance, femoral arterial and venous access is obtained with placement of 6 Fr sheaths on the right side.  [place] images  are captured and digitally stored in the patient's record. A pigtail diagnostic catheter was passed through the femoral arterial sheath under fluoroscopic guidance into the aortic root (non-coronary cusp).  A temporary transvenous pacemaker catheter was passed through the femoral venous sheath under fluoroscopic guidance into the right ventricle.  The pacemaker was tested to ensure stable lead placement and pacemaker capture.   TRANSAXILLARY ACCESS:  Please see the complete note of Dr Cyndia Bent. An 8 French sheath is placed after exposing the left subclavian artery. An AL-1 catheter was used to direct a straight-tip exchange length wire across the native aortic valve into the left ventricle. This was exchanged out for a pigtail catheter and position was confirmed in the LV apex.  Simultaneous LV and Ao pressures were recorded.  The pigtail catheter was exchanged for a Confida wire in the LV apex.    BALLOON AORTIC VALVULOPLASTY:  Not performed  TRANSCATHETER HEART VALVE DEPLOYMENT:  A Medtronic Evolut Pro-Plus transcatheter heart valve (size 26 mm) was prepared and crimped per manufacturer's guidelines, and the proper orientation of the valve is confirmed on the Medtronic delivery system. The valve was advanced over the Confida wire until it is positioned at the base of the pigtail catheter in the aortic valve annulus. Using the fine tuning wheel, valve deployment begins until annular contact is made. Controlled ventricular pacing is performed. Once proper position is confirmed via aortic root angiograms in the cusp overlap and LAO projections, the valve is deployed to 80% where it is fully functional. The patient's hemodynamic recovery following valve deployment is good.  The valve had to be repositioned once during deployment to be  positioned appropriately across the aortic valve annulus. Final position is confirmed and the valve is slowly deployed over 30 seconds until both paddles are released. The delivery catheter and Confida wire are removed and the valve is assessed with echocardiography. Echo demostrated acceptable post-procedural gradients, stable mitral valve function, and moderate paravalvular aortic insufficiency. Because of moderate AI, the valve is removed from the body and changed out for a 16 Fr Cook sheath. The valve is crossed with a pigtail wire and the Confida is repositioned in the LV apex. The Medtronic valve is then post-dilated with a 20 mm Bard True balloon via hand inflation during rapid ventricular pacing. The patient tolerated the pacing run well and had rapid hemodynamic recovery. The paravalvular regurgitation is reduced to trivial/mild.    PROCEDURE COMPLETION:  Please see Dr Vivi Martens note for description of subclavian artery repair.  Protamine is administered once repair was complete. The site is clear with no evidence of bleeding or hematoma after the sutures are tightened. The temporary pacemaker and pigtail catheters are removed. Mynx closure is used for femoral arterial hemostasis for the 6 Fr sheath.  The patient tolerated the procedure well and is transported to the recovery area in stable condition. There were no immediate intraoperative complications. All sponge instrument and needle counts are verified correct at completion of the operation.   The patient received a total of 82 mL of intravenous contrast during the procedure.   Sherren Mocha, MD 01/09/2021 3:50 PM

## 2021-01-09 NOTE — Anesthesia Procedure Notes (Signed)
Procedure Name: Intubation Date/Time: 01/09/2021 12:57 PM Performed by: Renato Shin, CRNA Pre-anesthesia Checklist: Patient identified, Emergency Drugs available, Suction available and Patient being monitored Patient Re-evaluated:Patient Re-evaluated prior to induction Oxygen Delivery Method: Circle system utilized Preoxygenation: Pre-oxygenation with 100% oxygen Induction Type: IV induction Ventilation: Mask ventilation without difficulty Laryngoscope Size: Glidescope and 3 Grade View: Grade I Tube type: Oral Tube size: 7.0 mm Number of attempts: 1 Airway Equipment and Method: Stylet, Bite block and Video-laryngoscopy Placement Confirmation: ETT inserted through vocal cords under direct vision, positive ETCO2 and breath sounds checked- equal and bilateral Secured at: 22 cm Tube secured with: Tape Dental Injury: Teeth and Oropharynx as per pre-operative assessment

## 2021-01-09 NOTE — Op Note (Signed)
[facility]   MULTIDISCIPLINARY HEART VALVE TEAM   TAVR OPERATIVE NOTE    Lori Price [id number]   Date of Procedure:  01/09/2021  Preoperative Diagnosis: Severe Aortic Stenosis   Postoperative Diagnosis: Same   Procedure:   Transcatheter Aortic Valve Replacement - Left Subclavian Artery Approach  Medtronic  Evolut Pro +(size 26 mm,  serial # [id number])   Co-Surgeons:  Gaye Pollack, MD and Sherren Mocha, MD   Anesthesiologist:  Therisa Doyne, MD  Echocardiographer:  P. Johnsie Cancel, MD  Pre-operative Echo Findings: Severe aortic stenosis Normal left ventricular systolic function  Post-operative Echo Findings: Mild paravalvular leak Normal left ventricular systolic function   BRIEF CLINICAL NOTE AND INDICATIONS FOR SURGERY  This 87 year old woman has stage D, severe, symptomatic aortic stenosis with New York Heart Association class III symptoms of exertional fatigue and shortness of breath with minimal activity consistent with chronic diastolic congestive heart failure.  She has also had exertional chest pressure, dizziness, and orthopnea.  I have personally reviewed her 2D echocardiogram, cardiac catheterization, and CTA studies.  Her echocardiogram shows severe aortic stenosis with a mean gradient of 60 mmHg and an aortic valve area of 0.48 cm.  Left ventricular systolic function is normal with severe concentric LVH and diastolic dysfunction.  Cardiac catheterization shows severe native three-vessel coronary disease with a patent left internal mammary graft to the LAD which is chronically occluded in the midportion.  The previously placed vein grafts are occluded as is the proximal RCA.  It fills distally from collaterals.  There is 70% ostial to proximal left circumflex stenosis.  I agree that aortic valve replacement is indicated in this patient for relief of her symptoms and to prevent progressive left ventricular deterioration.  I think her coronary disease  can be treated medically.  I do not think she is a candidate for open surgical aortic valve replacement and coronary bypass surgery due to her advanced age, previous coronary bypass surgery, and multiple comorbidities with limited mobility.  I think transcatheter aortic valve replacement is a reasonable alternative.  Her gated cardiac CTA shows anatomy suitable for TAVR although her annular area is relatively small and would only be suitable for a 20 mm Edwards SAPIEN valve.  Her dimensions appear suitable for a 26 mm Medtronic Evolut Pro+ valve which would be a better choice given her BSA of 2.22.  Her abdominal and pelvic CTA shows adequate pelvic vascular anatomy to allow transfemoral insertion. She was seen in the office by KT, PA-c at the end of last week and was found to have significant rash in both groins suggesting candida. She was started on treatment and reexamined today but still had significant rash and with her panniculus we felt that proceeding with left subclavian access would probably be better.   The patient and her husband were counseled at length regarding treatment alternatives for management of severe symptomatic aortic stenosis. The risks and benefits of surgical intervention has been discussed in detail. Long-term prognosis with medical therapy was discussed. Alternative approaches such as conventional surgical aortic valve replacement, transcatheter aortic valve replacement, and palliative medical therapy were compared and contrasted at length. This discussion was placed in the context of the patient's own specific clinical presentation and past medical history. All of their questions have been addressed.    Following the decision to proceed with transcatheter aortic valve replacement, a discussion was held regarding what types of management strategies would be attempted intraoperatively in the event of life-threatening complications,  including whether or not the patient would be  considered a candidate for the use of cardiopulmonary bypass and/or conversion to open sternotomy for attempted surgical intervention.  I do not think she is a candidate for emergent sternotomy to manage any intraoperative complications.  The patient is aware of the fact that transient use of cardiopulmonary bypass may be necessary. The patient has been advised of a variety of complications that might develop including but not limited to risks of death, stroke, paravalvular leak, aortic dissection or other major vascular complications, aortic annulus rupture, device embolization, cardiac rupture or perforation, mitral regurgitation, acute myocardial infarction, arrhythmia, heart block or bradycardia requiring permanent pacemaker placement, congestive heart failure, respiratory failure, renal failure, pneumonia, infection, other late complications related to structural valve deterioration or migration, or other complications that might ultimately cause a temporary or permanent loss of functional independence or other long term morbidity. The patient provides full informed consent for the procedure as described and all questions were answered.        DETAILS OF THE OPERATIVE PROCEDURE  PREPARATION:    The patient is brought to the operating room on the above mentioned date and central monitoring was established by the anesthesia team including placement of a central venous line and radial arterial line. The patient is placed in the supine position on the operating table.  Intravenous antibiotics are administered.  General endotracheal anesthesia is induced uneventfully.  Baseline transesophageal echocardiogram was performed. The patient's chest, abdomen, both groins, and both lower extremities are prepared and draped in a sterile manner. A time out procedure is performed.   PERIPHERAL ACCESS:    Using the modified Seldinger technique, femoral arterial and venous access was obtained with placement of 6  Fr sheaths on the right side.  A pigtail diagnostic catheter was passed through the right arterial sheath under fluoroscopic guidance into the aortic root.  A temporary transvenous pacemaker catheter was passed through the right femoral venous sheath under fluoroscopic guidance into the right ventricle.  The pacemaker was tested to ensure stable lead placement and pacemaker capture.   LEFT SUBCLAVIAN ACCESS:   A transverse incision was made below the left clavicle and carried down through the subcutaneous tissue using electrocautery. The pectoralis major muscle was split along its fibers and the pectoralis minor muscle retracted laterally. The left axillary artery was identified and encircled with a vessel loop. The patient was heparinized systemically and ACT verified > 250 seconds.  A double concentric purse string suture of CV-4 gortex was placed in the anterior wall of the artery. The artery was cannulated with a needle and a J- wire advanced into the ascending aorta. An 8 F sheath was inserted over the wire. The aortic valve was crossed with a JR4 catheter and a straight wire. This was exchanged for a pigtail catheter and position was confirmed in the LV apex. Simultaneous LV and Ao pressures were recorded.  The pigtail catheter was exchanged for a Confida wire in the LV apex.      TRANSCATHETER HEART VALVE DEPLOYMENT:   A Medtronic Evolut Pro + transcatheter heart valve (size 26 mm) was prepared and crimped per manufacturer's guidelines, and the proper orientation of the valve is confirmed on the Medtronic EnVeo Pro delivery system. The valve was advanced over the Confida wire until in an appropriate position in the ascending aorta. The valve was then advanced across the aortic valve. The valve was carefully positioned across the aortic valve annulus. The valve was positioned using  cusp overlap technique.   The valve is carefully deployed using the retractor dial so that 80% of the valve is released.  TEE demonstrated 2+ paravalvular AI at 3:00 and the patient's heart rhythm was stable. The valve was fully released during pacing at 120 bpm. There was felt to be 2+ paravalvular leak and no central aortic insufficiency. The patient's hemodynamic recovery following valve deployment is good.  The delivery catheter was retracted. Because of moderate 2+ paravalvular AI we decided to perform post dilation. The delivery catheter was removed over the wire and a 16 Fr Cook sheath inserted. The valve is crossed with a pigtail wire and the Confida was repositioned in the LV apex. The Medtronic valve is then post-dilated with a 20 mm True balloon via hand inflation during rapid ventricular pacing. The patient had rapid hemodynamic recovery. The paravalvular regurgitation was reduced to trivial/mild.   PROCEDURE COMPLETION:   The sheath was removed and subclavian artery closure performed using the gortex pursestring sutures.   Protamine was administered once arterial repair was complete. The temporary pacemaker, pigtail catheter and femoral sheaths were removed with Mynx closure used for the right femoral artery.   The patient tolerated the procedure well and is transported to the cath lab recovery area in stable condition. There were no immediate intraoperative complications. All sponge instrument and needle counts are verified correct at completion of the operation.   No blood products were administered during the operation.  The patient received a total of 82 mL of intravenous contrast during the procedure.   Gaye Pollack, MD  01/09/2021

## 2021-01-09 NOTE — Progress Notes (Signed)
LOCATION: right RADIAL a-line removed  Manual pressure applied for approximately 10 minutes, gauze with tegaderm applied   SITE UPON ARRIVAL: LEVEL 0  SITE AFTER BAND REMOVAL: LEVEL 0   CIRCULATION SENSATION AND MOVEMENT: yes, +2 radial pulse

## 2021-01-09 NOTE — Progress Notes (Signed)
Pt arrived to 4e from cath lab. Pt oriented to room and staff. Vitals obtained. CHG bath done. Right groin and left subclavian level 0.

## 2021-01-09 NOTE — Anesthesia Preprocedure Evaluation (Addendum)
Anesthesia Evaluation  Patient identified by MRN, date of birth, ID band Patient awake    Reviewed: Allergy & Precautions, H&P , NPO status , Patient's Chart, lab work & pertinent test results, reviewed documented beta blocker date and time   Airway Mallampati: II  TM Distance: >3 FB Neck ROM: Full    Dental no notable dental hx. (+) Teeth Intact, Dental Advisory Given   Pulmonary shortness of breath, asthma , sleep apnea ,    Pulmonary exam normal breath sounds clear to auscultation       Cardiovascular hypertension, Pt. on medications and Pt. on home beta blockers + CAD and + Peripheral Vascular Disease  + Valvular Problems/Murmurs AS  Rhythm:Regular Rate:Normal     Neuro/Psych TIAnegative psych ROS   GI/Hepatic Neg liver ROS, GERD  ,  Endo/Other  negative endocrine ROS  Renal/GU negative Renal ROS  negative genitourinary   Musculoskeletal   Abdominal   Peds  Hematology  (+) Blood dyscrasia, anemia ,   Anesthesia Other Findings   Reproductive/Obstetrics negative OB ROS                            Anesthesia Physical Anesthesia Plan  ASA: 4  Anesthesia Plan: General   Post-op Pain Management:    Induction: Intravenous  PONV Risk Score and Plan: 3 and Ondansetron, Dexamethasone and Treatment may vary due to age or medical condition  Airway Management Planned: Oral ETT  Additional Equipment: Arterial line and Ultrasound Guidance Line Placement  Intra-op Plan:   Post-operative Plan: Extubation in OR  Informed Consent: I have reviewed the patients History and Physical, chart, labs and discussed the procedure including the risks, benefits and alternatives for the proposed anesthesia with the patient or authorized representative who has indicated his/her understanding and acceptance.     Dental advisory given  Plan Discussed with: CRNA  Anesthesia Plan Comments:         Anesthesia Quick Evaluation

## 2021-01-09 NOTE — Transfer of Care (Signed)
Immediate Anesthesia Transfer of Care Note  Patient: [name]  Procedure(s) Performed: TRANSCATHETER AORTIC VALVE REPLACEMENT, LEFT SUBCLAVIAN (Left) TRANSESOPHAGEAL ECHOCARDIOGRAM (TEE)  Patient Location: PACU and Cath Lab  Anesthesia Type:General  Level of Consciousness: drowsy  Airway & Oxygen Therapy: Patient Spontanous Breathing and Patient connected to face mask oxygen  Post-op Assessment: Report given to RN, Post -op Vital signs reviewed and stable and Patient moving all extremities  Post vital signs: Reviewed and stable  Last Vitals:  Vitals Value Taken Time  BP    Temp    Pulse 83 01/09/21 1617  Resp 22 01/09/21 1617  SpO2 96 % 01/09/21 1617  Vitals shown include unvalidated device data.  Last Pain:  Vitals:   01/09/21 1034  TempSrc:   PainSc: 0-No pain         Complications: No notable events documented.

## 2021-01-09 NOTE — Anesthesia Postprocedure Evaluation (Signed)
Anesthesia Post Note  Patient: Lori Price  Procedure(s) Performed: TRANSCATHETER AORTIC VALVE REPLACEMENT, LEFT SUBCLAVIAN (Left) TRANSESOPHAGEAL ECHOCARDIOGRAM (TEE)     Patient location during evaluation: Cath Lab Anesthesia Type: General Level of consciousness: awake and alert Pain management: pain level controlled Vital Signs Assessment: post-procedure vital signs reviewed and stable Respiratory status: spontaneous breathing, nonlabored ventilation, respiratory function stable and patient connected to nasal cannula oxygen Cardiovascular status: blood pressure returned to baseline and stable Postop Assessment: no apparent nausea or vomiting Anesthetic complications: no   No notable events documented.  Last Vitals:  Vitals:   01/09/21 1720 01/09/21 1725  BP: 122/78 (!) 151/68  Pulse: 77 73  Resp: 20 (!) 21  Temp:    SpO2: 92% 91%    Last Pain:  Vitals:   01/09/21 1645  TempSrc: Temporal  PainSc:                  Marigny Borre,W. EDMOND

## 2021-01-09 NOTE — Interval H&P Note (Signed)
History and Physical Interval Note:  01/09/2021 11:15 AM  Lori Price  has presented today for surgery, with the diagnosis of Severe Aortic Stenosis.  The various methods of treatment have been discussed with the patient and family. After consideration of risks, benefits and other options for treatment, the patient has consented to  Procedure(s): TRANSCATHETER AORTIC VALVE REPLACEMENT, LEFT SUBCLAVIAN (Left) TRANSESOPHAGEAL ECHOCARDIOGRAM (TEE) (N/A) as a surgical intervention.  The patient's history has been reviewed, patient examined, no change in status, stable for surgery.  I have reviewed the patient's chart and labs.  Questions were answered to the patient's satisfaction.     Gaye Pollack

## 2021-01-09 NOTE — Anesthesia Procedure Notes (Signed)
Arterial Line Insertion Start/End8/23/2022 11:00 AM, 01/09/2021 11:05 AM Performed by: Betha Loa, CRNA, CRNA  Patient location: O[facility] procedure area. Preanesthetic checklist: patient identified, IV checked, site marked, risks and benefits discussed, surgical consent, monitors and equipment checked, pre-op evaluation, timeout performed and anesthesia consent Lidocaine 1% used for infiltration Right, radial was placed Catheter size: 20 G Hand hygiene performed , maximum sterile barriers used  and Seldinger technique used Allen's test indicative of satisfactory collateral circulation Attempts: 1 Procedure performed without using ultrasound guided technique. Following insertion, dressing applied and Biopatch. Post procedure assessment: normal  Patient tolerated the procedure well with no immediate complications.

## 2021-01-10 ENCOUNTER — Encounter (HOSPITAL_COMMUNITY): Payer: Self-pay | Admitting: Cardiovascular Disease

## 2021-01-10 ENCOUNTER — Inpatient Hospital Stay (HOSPITAL_COMMUNITY): Payer: Medicare Other

## 2021-01-10 DIAGNOSIS — I350 Nonrheumatic aortic (valve) stenosis: Principal | ICD-10-CM

## 2021-01-10 DIAGNOSIS — Z20822 Contact with and (suspected) exposure to covid-19: Secondary | ICD-10-CM | POA: Diagnosis not present

## 2021-01-10 DIAGNOSIS — Z006 Encounter for examination for normal comparison and control in clinical research program: Secondary | ICD-10-CM | POA: Diagnosis not present

## 2021-01-10 DIAGNOSIS — I25810 Atherosclerosis of coronary artery bypass graft(s) without angina pectoris: Secondary | ICD-10-CM | POA: Diagnosis not present

## 2021-01-10 DIAGNOSIS — Z952 Presence of prosthetic heart valve: Secondary | ICD-10-CM

## 2021-01-10 DIAGNOSIS — Z954 Presence of other heart-valve replacement: Secondary | ICD-10-CM

## 2021-01-10 LAB — CBC
HCT: 41.8 % (ref 36.0–46.0)
Hemoglobin: 13.2 g/dL (ref 12.0–15.0)
MCH: 29.1 pg (ref 26.0–34.0)
MCHC: 31.6 g/dL (ref 30.0–36.0)
MCV: 92.3 fL (ref 80.0–100.0)
Platelets: 175 K/uL (ref 150–400)
RBC: 4.53 MIL/uL (ref 3.87–5.11)
RDW: 14.9 % (ref 11.5–15.5)
WBC: 11.6 K/uL — ABNORMAL HIGH (ref 4.0–10.5)
nRBC: 0 % (ref 0.0–0.2)

## 2021-01-10 LAB — ECHOCARDIOGRAM COMPLETE
AR max vel: 1.71 cm2
AV Area VTI: 1.61 cm2
AV Area mean vel: 1.63 cm2
AV Mean grad: 7.5 mmHg
AV Peak grad: 13.5 mmHg
Ao pk vel: 1.84 m/s
Area-P 1/2: 1.93 cm2
Calc EF: 48.6 %
Height: 67 in
MV M vel: 2.99 m/s
MV Peak grad: 35.8 mmHg
S' Lateral: 4.4 cm
Single Plane A2C EF: 53.5 %
Single Plane A4C EF: 36.1 %
Weight: 3710.78 [oz_av]

## 2021-01-10 LAB — BASIC METABOLIC PANEL WITH GFR
Anion gap: 11 (ref 5–15)
BUN: 23 mg/dL (ref 8–23)
CO2: 29 mmol/L (ref 22–32)
Calcium: 9.4 mg/dL (ref 8.9–10.3)
Chloride: 98 mmol/L (ref 98–111)
Creatinine, Ser: 0.98 mg/dL (ref 0.44–1.00)
GFR, Estimated: 56 mL/min — ABNORMAL LOW (ref >60)
Glucose, Bld: 160 mg/dL — ABNORMAL HIGH (ref 70–99)
Potassium: 4.6 mmol/L (ref 3.5–5.1)
Sodium: 138 mmol/L (ref 135–145)

## 2021-01-10 LAB — MAGNESIUM: Magnesium: 2 mg/dL (ref 1.7–2.4)

## 2021-01-10 MED FILL — Lidocaine HCl Local Preservative Free (PF) Inj 1%: INTRAMUSCULAR | Qty: 30 | Status: AC

## 2021-01-10 NOTE — Progress Notes (Signed)
TAVR team note: Patient seen by Dr. Cyndia Bent this morning.  She is doing really well today.  We are holding her beta-blocker as he outlined because of new left bundle branch block and some bradycardia overnight.  Her QRS duration has not changed from yesterday to today.  We will repeat an EKG tomorrow morning.  Hopefully she will be ready for discharge tomorrow, possibly on the lower dose of her beta-blocker.  Right groin site is clear and infraclavicular incision site is clear.  Sherren Mocha 01/10/2021 12:32 PM

## 2021-01-10 NOTE — Evaluation (Signed)
Physical Therapy Evaluation Patient Details Name: Lori Price MRN: [id number] DOB: [birth year] Today's Date: 01/10/2021   History of Present Illness  Pt is an 87 y/o female s/p TAVR on 8/23. PMH includes a fib HTN, CAD s/p CABG, PVD, untreated sleep apnea, and aortic stenosis.  Clinical Impression  Pt admitted secondary to problem above with deficits below. Pt requiring min guard A for mobility tasks using RW. Noted fatigue during ambulation and required standing rest X1. Recommending HHPT at d/c. Reports her husband and children can assist at d/c. Will continue to follow acutely.     Follow Up Recommendations Home health PT;Supervision for mobility/OOB    Equipment Recommendations  None recommended by PT    Recommendations for Other Services       Precautions / Restrictions Precautions Precautions: Fall Restrictions Weight Bearing Restrictions: No      Mobility  Bed Mobility               General bed mobility comments: In chair upon entry    Transfers Overall transfer level: Needs assistance Equipment used: Rolling walker (2 wheeled) Transfers: Sit to/from Stand Sit to Stand: Min guard         General transfer comment: Min guard for safety  Ambulation/Gait Ambulation/Gait assistance: Min guard Gait Distance (Feet): 75 Feet Assistive device: Rolling walker (2 wheeled) Gait Pattern/deviations: Step-through pattern;Decreased stride length Gait velocity: Decreased   General Gait Details: slow, labored gait. Required standing rest X1. Distance limited secondary to fatigue. Min guard for safety using RW. Cues for proximity to device.  Stairs            Wheelchair Mobility    Modified Rankin (Stroke Patients Only)       Balance Overall balance assessment: Needs assistance Sitting-balance support: No upper extremity supported;Feet supported Sitting balance-Leahy Scale: Good     Standing balance support: Bilateral upper extremity supported;During  functional activity Standing balance-Leahy Scale: Poor Standing balance comment: Reliant on BUE support                             Pertinent Vitals/Pain Pain Assessment: No/denies pain    Home Living Family/patient expects to be discharged to:: Private residence (senior living) Living Arrangements: Spouse/significant other Available Help at Discharge: Family;Available 24 hours/day Type of Home: House Home Access: Level entry     Home Layout: One level Home Equipment: Walker - 4 wheels;Cane - single point;Shower seat      Prior Function Level of Independence: Independent with assistive device(s)         Comments: Uses rollator for mobility tasks     Hand Dominance        Extremity/Trunk Assessment   Upper Extremity Assessment Upper Extremity Assessment: Overall WFL for tasks assessed    Lower Extremity Assessment Lower Extremity Assessment: Generalized weakness    Cervical / Trunk Assessment Cervical / Trunk Assessment: Kyphotic  Communication   Communication: No difficulties  Cognition Arousal/Alertness: Awake/alert Behavior During Therapy: WFL for tasks assessed/performed Overall Cognitive Status: Within Functional Limits for tasks assessed                                        General Comments      Exercises     Assessment/Plan    PT Assessment Patient needs continued PT services  PT Problem List Decreased strength;Decreased balance;Decreased  mobility;Decreased activity tolerance;Decreased knowledge of use of DME;Cardiopulmonary status limiting activity;Decreased knowledge of precautions       PT Treatment Interventions DME instruction;Gait training;Functional mobility training;Therapeutic activities;Therapeutic exercise;Balance training;Patient/family education    PT Goals (Current goals can be found in the Care Plan section)  Acute Rehab PT Goals Patient Stated Goal: to go home PT Goal Formulation: With  patient Time For Goal Achievement: 01/24/21 Potential to Achieve Goals: Good    Frequency Min 3X/week   Barriers to discharge        Co-evaluation               AM-PAC PT "6 Clicks" Mobility  Outcome Measure Help needed turning from your back to your side while in a flat bed without using bedrails?: A Little Help needed moving from lying on your back to sitting on the side of a flat bed without using bedrails?: A Little Help needed moving to and from a bed to a chair (including a wheelchair)?: A Little Help needed standing up from a chair using your arms (e.g., wheelchair or bedside chair)?: A Little Help needed to walk in hospital room?: A Little Help needed climbing 3-5 steps with a railing? : A Little 6 Click Score: 18    End of Session Equipment Utilized During Treatment: Gait belt Activity Tolerance: Patient limited by fatigue Patient left: in chair;with call bell/phone within reach Nurse Communication: Mobility status PT Visit Diagnosis: Other abnormalities of gait and mobility (R26.89);Muscle weakness (generalized) (M62.81)    Time: [id number] PT Time Calculation (min) (ACUTE ONLY): 16 min   Charges:   PT Evaluation $PT Eval Low Complexity: 1 Low          Reuel Derby, PT, DPT  Acute Rehabilitation Services  Pager: [phone] Office: [phone] .  [place] 01/10/2021, 12:19 PM

## 2021-01-10 NOTE — Discharge Instructions (Signed)
ACTIVITY AND EXERCISE °• Daily activity and exercise are an important part of your recovery. People recover at different rates depending on their general health and type of valve procedure. °• Most people recovering from TAVR feel better relatively quickly  °• No lifting, pushing, pulling more than 10 pounds (examples to avoid: groceries, vacuuming, gardening, golfing): °            - For one week with a procedure through the groin. °            - For six weeks for procedures through the chest wall or neck. °NOTE: You will typically see one of our providers 7-14 days after your procedure to discuss WHEN TO RESUME the above activities.  °  °  °DRIVING °• Do not drive until you are seen for follow up and cleared by a provider. Generally, we ask patient to not drive for 1 week after their procedure. °• If you have been told by your doctor in the past that you may not drive, you must talk with him/her before you begin driving again. °  °DRESSING °• Groin site: you may leave the clear dressing over the site for up to one week or until it falls off. °  °HYGIENE °• If you had a femoral (leg) procedure, you may take a shower when you return home. After the shower, pat the site dry. Do NOT use powder, oils or lotions in your groin area until the site has completely healed. °• If you had a chest procedure, you may shower when you return home unless specifically instructed not to by your discharging practitioner. °            - DO NOT scrub incision; pat dry with a towel. °            - DO NOT apply any lotions, oils, powders to the incision. °            - No tub baths / swimming for at least 2 weeks. °• If you notice any fevers, chills, increased pain, swelling, bleeding or pus, please contact your doctor. °  °ADDITIONAL INFORMATION °• If you are going to have an upcoming dental procedure, please contact our office as you will require antibiotics ahead of time to prevent infection on your heart valve.  ° ° °If you have any  questions or concerns you can call the structural heart phone during normal business hours 8am-4pm. If you have an urgent need after hours or weekends please call [phone] to talk to the on call provider for general cardiology. If you have an emergency that requires immediate attention, please call 911.  ° ° °After TAVR Checklist ° °Check  Test Description  ° Follow up appointment in 1-2 weeks  You will see our structural heart physician assistant, Katie Niam Nepomuceno. Your incision sites will be checked and you will be cleared to drive and resume all normal activities if you are doing well.    ° 1 month echo and follow up  You will have an echo to check on your new heart valve and be seen back in the office by Katie Riyanshi Wahab. Many times the echo is not read by your appointment time, but Katie will call you later that day or the following day to report your results.  ° Follow up with your primary cardiologist You will need to be seen by your primary cardiologist in the following 3-6 months after your 1 month appointment in the valve   clinic. Often times your Plavix or Aspirin will be discontinued during this time, but this is decided on a case by case basis.   ° 1 year echo and follow up You will have another echo to check on your heart valve after 1 year and be seen back in the office by Katie Katiana Ruland. This your last structural heart visit.  ° Bacterial endocarditis prophylaxis  You will have to take antibiotics for the rest of your life before all dental procedures (even teeth cleanings) to protect your heart valve. Antibiotics are also required before some surgeries. Please check with your cardiologist before scheduling any surgeries. Also, please make sure to tell us if you have a penicillin allergy as you will require an alternative antibiotic.   ° ° °

## 2021-01-10 NOTE — Progress Notes (Signed)
*  PRELIMINARY RESULTS* Echocardiogram 2D Echocardiogram has been performed.  Luisa Hart RDCS 01/10/2021, 1:21 PM

## 2021-01-10 NOTE — Progress Notes (Signed)
CARDIAC REHAB PHASE I   PRE:  Rate/Rhythm: 67 Afib  BP:  Sitting: 107/68      SaO2: 97 2 L --> 93 RA  MODE:  Ambulation: 100 ft   POST:  Rate/Rhythm: 74 Afib  BP:  Sitting: 86/72 --> 119/68    SaO2: 95 RA   Pt ambulated 129f in hallway assist of one with front wheel walker. Pt states improvement in breathing, feels some stronger than walk this morning. [facility] Encouraged continued ambulation with mobility team later today. Will continue to follow.  [id number] Falco RN BSN 01/10/2021 3:04 PM

## 2021-01-10 NOTE — Progress Notes (Signed)
CARDIAC REHAB PHASE I   Went to offer to walk with pt, pt working with PT. Will f/u to encourage continued ambulation.  Rufina Falco, RN BSN 01/10/2021 10:46 AM

## 2021-01-10 NOTE — Progress Notes (Signed)
1 Day Post-Op Procedure(s) (LRB): TRANSCATHETER AORTIC VALVE REPLACEMENT, LEFT SUBCLAVIAN (Left) TRANSESOPHAGEAL ECHOCARDIOGRAM (TEE) (N/A) Subjective: No complaints. Slept well. Sitting up in chair but has not walked yet since surgery.   Monitor reviewed: atrial fib with LBBB. Rate 50's to 60 with some rate in the 40's overnight.  Objective: Vital signs in last 24 hours: Temp:  [97.4 F (36.3 C)-98 F (36.7 C)] 97.4 F (36.3 C) (08/24 0726) Pulse Rate:  [0-295] 65 (08/23 1800) Cardiac Rhythm: Atrial fibrillation (08/24 0726) Resp:  [7-23] 17 (08/24 0726) BP: (110-152)/(51-99) 137/64 (08/24 0726) SpO2:  [0 %-97 %] 93 % (08/24 0726) Arterial Line BP: (102-119)/(48-60) 114/55 (08/23 1710) Weight:  [104.3 kg-105.2 kg] 105.2 kg (08/24 0640)  Hemodynamic parameters for last 24 hours:    Intake/Output from previous day: 08/23 0701 - 08/24 0700 In: 3000.4 [P.O.:960; I.V.:1740.4; IV Piggyback:300] Out: 950 [Urine:850; Blood:100] Intake/Output this shift: No intake/output data recorded.  General appearance: alert and cooperative Neurologic: intact Heart: irregularly irregular rhythm and no murmur Lungs: clear to auscultation bilaterally Extremities: extremities normal, atraumatic, no cyanosis or edema Wound: left subclavian incision looks good. Right groin site looks good.  Lab Results: Recent Labs    01/08/21 0857 01/09/21 1639 01/10/21 0354  WBC 9.5  --  11.6*  HGB 14.6 13.3 13.2  HCT 45.4 39.0 41.8  PLT 198  --  175   BMET:  Recent Labs    01/08/21 0857 01/09/21 1639 01/10/21 0354  NA 136 138 138  K 4.0 5.2* 4.6  CL 103 104 98  CO2 21*  --  29  GLUCOSE 189* 165* 160*  BUN 22 34* 23  CREATININE 0.96 0.80 0.98  CALCIUM 9.4  --  9.4    PT/INR:  Recent Labs    01/08/21 0857  LABPROT 14.3  INR 1.1   ABG    Component Value Date/Time   PHART 7.382 01/08/2021 0956   HCO3 24.7 01/08/2021 0956   TCO2 28 01/09/2021 1639   O2SAT 94.7 01/08/2021 0956   CBG  (last 3)  No results for input(s): GLUCAP in the last 72 hours.  ECG: atrial fib 61, new LBBB postop  Assessment/Plan: S/P Procedure(s) (LRB): TRANSCATHETER AORTIC VALVE REPLACEMENT, LEFT SUBCLAVIAN (Left) TRANSESOPHAGEAL ECHOCARDIOGRAM (TEE) (N/A)  POD 1 Hemodynamically stable  Chronic atrial fib with rate controlled on Atenolol 100 preop. With new LBBB and bradycardia overnight will hold Atenolol for now. May just need lower dose.  2D echo this am.  She will need another day here to ambulate given her baseline debilitation and using a walker at all times to ambulate. Will ask PT to see her and cardiac rehab can also ambulate her.  Plan to resume Eliquis at discharge.    LOS: 1 day    Gaye Pollack 01/10/2021

## 2021-01-11 ENCOUNTER — Telehealth: Payer: Self-pay | Admitting: Cardiovascular Disease

## 2021-01-11 ENCOUNTER — Encounter: Payer: Self-pay | Admitting: Physician Assistant

## 2021-01-11 ENCOUNTER — Inpatient Hospital Stay (HOSPITAL_COMMUNITY)
Admission: RE | Admit: 2021-01-11 | Discharge: 2021-01-11 | Disposition: A | Discharge status: Home / Self Care | Payer: Medicare Other | Source: Home / Self Care | Attending: Physician Assistant | Admitting: Physician Assistant

## 2021-01-11 ENCOUNTER — Encounter (HOSPITAL_COMMUNITY): Payer: Self-pay | Admitting: Cardiovascular Disease

## 2021-01-11 DIAGNOSIS — I350 Nonrheumatic aortic (valve) stenosis: Secondary | ICD-10-CM | POA: Diagnosis not present

## 2021-01-11 DIAGNOSIS — Z954 Presence of other heart-valve replacement: Secondary | ICD-10-CM

## 2021-01-11 DIAGNOSIS — Z20822 Contact with and (suspected) exposure to covid-19: Secondary | ICD-10-CM | POA: Diagnosis not present

## 2021-01-11 DIAGNOSIS — I25810 Atherosclerosis of coronary artery bypass graft(s) without angina pectoris: Secondary | ICD-10-CM | POA: Diagnosis not present

## 2021-01-11 DIAGNOSIS — I4819 Other persistent atrial fibrillation: Secondary | ICD-10-CM

## 2021-01-11 DIAGNOSIS — Z006 Encounter for examination for normal comparison and control in clinical research program: Secondary | ICD-10-CM | POA: Diagnosis not present

## 2021-01-11 DIAGNOSIS — I447 Left bundle-branch block, unspecified: Secondary | ICD-10-CM

## 2021-01-11 DIAGNOSIS — I498 Other specified cardiac arrhythmias: Secondary | ICD-10-CM

## 2021-01-11 LAB — TYPE AND SCREEN
ABO/RH(D): O POS
Antibody Screen: NEGATIVE
Unit division: 0

## 2021-01-11 LAB — POCT I-STAT, CHEM 8
BUN: 26 mg/dL — ABNORMAL HIGH (ref 8–23)
Calcium, Ion: 1.29 mmol/L (ref 1.15–1.40)
Chloride: 104 mmol/L (ref 98–111)
Creatinine, Ser: 0.8 mg/dL (ref 0.44–1.00)
Glucose, Bld: 113 mg/dL — ABNORMAL HIGH (ref 70–99)
HCT: 40 % (ref 36.0–46.0)
Hemoglobin: 13.6 g/dL (ref 12.0–15.0)
Potassium: 4 mmol/L (ref 3.5–5.1)
Sodium: 140 mmol/L (ref 135–145)
TCO2: 25 mmol/L (ref 22–32)

## 2021-01-11 LAB — POCT ACTIVATED CLOTTING TIME
Activated Clotting Time: 323 seconds
Activated Clotting Time: 138 seconds

## 2021-01-11 LAB — BPAM RBC
Blood Product Expiration Date: 202208292359
ISSUE DATE / TIME: 202208232200
Unit Type and Rh: 5100

## 2021-01-11 MED ORDER — CHLORTHALIDONE 25 MG PO TABS: 25.0000 mg | ORAL_TABLET | Freq: Every day | ORAL | 3 refills | Status: DC

## 2021-01-11 MED ORDER — ATENOLOL 50 MG PO TABS: 50.0000 mg | ORAL_TABLET | Freq: Every day | ORAL | 3 refills | Status: DC

## 2021-01-11 NOTE — Telephone Encounter (Signed)
[facility] calling with a abnormal ekg

## 2021-01-11 NOTE — Telephone Encounter (Signed)
Yes they always do this. She has persistent afib. Thank you !!! KT

## 2021-01-11 NOTE — Discharge Summary (Addendum)
[place] VALVE TEAM  Discharge Summary    Patient ID: Lori Price MRN: [id number]; DOB: 15-Oct-1931  Admit date: 01/09/2021 Discharge date: 01/11/2021  Primary Care Provider: Glenda Chroman, MD  Primary Cardiologist: Minus Breeding, MD / Dr. Burt Knack & Dr. Cyndia Bent (TAVR)  Discharge Diagnoses    Principal Problem:   S/P TAVR (transcatheter aortic valve replacement) Active Problems:   HLD (hyperlipidemia)   OBSTRUCTIVE SLEEP APNEA   Essential hypertension   Coronary atherosclerosis   Carotid artery disease (HCC)   Severe aortic stenosis   LBBB (left bundle branch block)   Persistent atrial fibrillation (HCC)   Morbid obesity (HCC)   Allergies Allergies  Allergen Reactions   Codeine Hives, Itching and Other (See Comments)    extreme agitation   Losartan Other (See Comments)    "I can't walk, I can't do anything"    Oxycodone Hives and Itching   Penicillins Swelling and Rash    Has patient had a PCN reaction causing immediate rash, facial/tongue/throat swelling, SOB or lightheadedness with hypotension: NO Has patient had a PCN reaction causing severe rash involving mucus membranes or skin necrosis: NO Has patient had a PCN reaction that required hospitalization: NO  Has patient had a PCN reaction occurring within the last 10 years: NO If all of the above answers are "NO", then may proceed with Cephalosporin use.     Pentazocine Other (See Comments) and Nausea And Vomiting   Quinapril Cough   Sibutramine Palpitations   Sulfamethoxazole Hives and Nausea And Vomiting    hives   Sulfonamide Derivatives Rash   Ace Inhibitors Cough   Ak-Mycin [Erythromycin] Other (See Comments)    unknown   Drug Ingredient [Modified Tree Tyrosine Adsorbate] Other (See Comments)    unknown   Eggs Or Egg-Derived Products Hives, Diarrhea and Other (See Comments)    Pt stated "My esophagus swells, I get hives, diarrhea, and I pass out"    Diagnostic  Studies/Procedures    TAVR OPERATIVE NOTE  Date of Procedure:                01/09/2021   Preoperative Diagnosis:      Severe Aortic Stenosis    Postoperative Diagnosis:    Same    Procedure:        Transcatheter Aortic Valve Replacement - Left Subclavian Artery Approach             Medtronic  Evolut Pro +(size 26 mm,  serial # [id number])              Co-Surgeons:                        Gaye Pollack, MD and Sherren Mocha, MD     Anesthesiologist:                  Therisa Doyne, MD   Echocardiographer:              Edmonia James, MD   Pre-operative Echo Findings: Severe aortic stenosis Normal left ventricular systolic function   Post-operative Echo Findings: Mild paravalvular leak Normal left ventricular systolic function  _____________    Echo 01/10/21:  IMPRESSIONS  1. Left ventricular ejection fraction, by estimation, is 50 to 55%. The  left ventricle has low normal function. The left ventricle has no regional  wall motion abnormalities. The left ventricular internal cavity size was  mildly dilated. Left ventricular  diastolic function could not be evaluated.   2. Right ventricular systolic function is normal. The right ventricular  size is normal. There is normal pulmonary artery systolic pressure.   3. The mitral valve is grossly normal. Mild mitral valve regurgitation.   4. There is a trivial PVL present. The aortic valve has been  repaired/replaced. Aortic valve regurgitation is trivial.  History of Present Illness     Lori Price is a 87 y.o. female with a history of hypertension, hyperlipidemia carotid artery disease status post right carotid endarterectomy in 2007, peripheral vascular disease, coronary artery disease status post coronary bypass graft surgery by Dr. Darcey Nora in 2001 after myocardial infarction, untreated sleep apnea, and severe aortic stenosis who presented to [facility] on 01/09[facility]21 for planned TAVR.   She had an echocardiogram in August 2021 showing  moderate to severe aortic stenosis with a mean gradient of 37 mmHg and a valve area by VTI of 0.8 cm.  There is trivial aortic insufficiency.  Left ventricular systolic function was normal with severe LVH.  She was asymptomatic at that time and states she was feeling fine until April 2022 when she began developing progressive exertional fatigue and dyspnea as well as chest pressure.  She has had occasional dizziness but no syncope.  She has had peripheral edema. Repeat echo on 11/09/2020 showed an increase in the mean gradient across aortic valve to 60 mmHg with a peak gradient of 109 mmHg.  Aortic valve area was 0.48 cm.  There was severe thickening and calcification of the aortic valve with restricted mobility.  Left ventricular ejection fraction was normal with severe concentric LVH.  There was moderate right ventricular systolic dysfunction. L/RHC on 12/01/20 showed severe three vessel CAD s/p 4V CABG with 1/4 patent bypass grafts. There was chronic total occlusion of the mid LAD. The mid and distal LAD fills from the patent LIMA graft. Moderately severe proximal Circumflex stenosis. The vein graft to the Circumflex system is chronically occluded. Chronic occlusion of the proximal RCA. The sequential vein graft to the RV marginal branch and PDA is chronically occluded. The distal RCA and branches fill from right to right and left to right collaterals. There was severe aortic stenosis (mean gradient 49.4 mmHg, Peak to peak gradient 58 mmHg, AVA 0.74 cm2). Medical therapy was recommended for her CAD.   The patient has been evaluated by the multidisciplinary valve team and felt to have severe, symptomatic aortic stenosis and to be a suitable candidate for TAVR, which was set up for 01/09/21. Of note, we initially planned for TF access but this was converted to a subclavian approach given bilateral candidal groin infections that did not improve with diflucan and nystatin ointment.   Hospital Course      Consultants: none   Severe AS: s/p successful TAVR with a 26 mm  Medtronic  Evolut Pro + THV via the left subclavian approach on 01/09/21. Post operative echo EF 50%, normally functioning TAVR with a mean gradient of 7.5 mmHg and trivial PVL. Right groin and left infraclavicular site are stable. ECG with afib and new LBBB. There has been no evidence of HAVB. She will be resume on home Eliquis alone without concurrent antiplatelet therapy given advance age and high fall risk.   Afib with slow VR and new LBBB: home Atenolol '100mg'$  daily was held given HRs into 40s and new LBBB. HR currently in 44s. Will restart back home BB at '50mg'$  and place a Zio AT to rule out delay  HAVB.   HTN: BP well controlled. Resume home meds with no changes except for Tentoric was discontinued and Atenolol cut from '100mg'$  to '50mg'$  and chlorthalidone '25mg'$  Rx's separately   HLD: continue statin  Bilateral candidal groin infections: continue diflucan and nystatin.   Hypoxia: pt with 02 sats ~88% on room air at rest. Will order home o2 _____________  Discharge Vitals Blood pressure 130/72, pulse (!) 51, temperature (!) 97.5 F (36.4 C), temperature source Oral, resp. rate 17, height '5\' 7"'$  (1.702 m), weight 105.2 kg, SpO2 98 %.  Filed Weights   01/09/21 1021 01/10/21 0640  Weight: 104.3 kg 105.2 kg    Labs & Radiologic Studies    CBC Recent Labs    01/09/21 1639 01/10/21 0354  WBC  --  11.6*  HGB 13.3 13.2  HCT 39.0 41.8  MCV  --  92.3  PLT  --  0000000   Basic Metabolic Panel Recent Labs    01/09/21 1639 01/10/21 0354  NA 138 138  K 5.2* 4.6  CL 104 98  CO2  --  29  GLUCOSE 165* 160*  BUN 34* 23  CREATININE 0.80 0.98  CALCIUM  --  9.4  MG  --  2.0   Liver Function Tests No results for input(s): AST, ALT, ALKPHOS, BILITOT, PROT, ALBUMIN in the last 72 hours. No results for input(s): LIPASE, AMYLASE in the last 72 hours. Cardiac Enzymes No results for input(s): CKTOTAL, CKMB, CKMBINDEX, TROPONINI in  the last 72 hours. BNP Invalid input(s): POCBNP D-Dimer No results for input(s): DDIMER in the last 72 hours. Hemoglobin A1C No results for input(s): HGBA1C in the last 72 hours. Fasting Lipid Panel No results for input(s): CHOL, HDL, LDLCALC, TRIG, CHOLHDL, LDLDIRECT in the last 72 hours. Thyroid Function Tests No results for input(s): TSH, T4TOTAL, T3FREE, THYROIDAB in the last 72 hours.  Invalid input(s): FREET3 _____________  DG Chest 2 View  Result Date: 01/08/2021 CLINICAL DATA:  Severe aortic stenosis, pre TAVR, shortness of breath, history coronary artery disease, hypertension EXAM: CHEST - 2 VIEW COMPARISON:  07/21/2017 FINDINGS: Enlargement of cardiac silhouette post CABG. Mediastinal contours and pulmonary vascularity normal. Chronic bronchitic changes with lingular scarring. No pulmonary infiltrate, pleural effusion, or pneumothorax. Bones demineralized with RIGHT shoulder prosthesis and dextroconvex thoracic scoliosis noted. IMPRESSION: Enlargement of cardiac silhouette post CABG. Chronic bronchitic changes with lingular scarring. Electronically Signed   By: Lavonia Dana M.D.   On: 01/08/2021 14:32   DG Chest Port 1 View  Result Date: 01/09/2021 CLINICAL DATA:  Severe aortic stenosis EXAM: PORTABLE CHEST 1 VIEW COMPARISON:  None. FINDINGS: Prior aortic valve repair. Prior median sternotomy and CABG. Cardiomegaly. Lingular scarring or atelectasis. No overt edema. No effusions or acute bony abnormality. IMPRESSION: Cardiomegaly. Lingular atelectasis or scarring. No acute cardiopulmonary disease. Electronically Signed   By: Rolm Baptise M.D.   On: 01/09/2021 18:28   ECHOCARDIOGRAM COMPLETE  Result Date: 01/10/2021    ECHOCARDIOGRAM REPORT   Patient Name:   Lori Price Date of Exam: 01/10/2021 Medical Rec #:  [id number]   Height:       67.0 in Accession #:    [id number]  Weight:       231.9 lb Date of Birth:  Apr 15, 1932   BSA:          2.154 m Patient Age:    51 years    BP:            99/59 mmHg Patient Gender: F  HR:           56 bpm. Exam Location:  Inpatient Procedure: 2D Echo, Cardiac Doppler and Color Doppler Indications:    S/P TAVR eval  History:        Patient has prior history of Echocardiogram examinations, most                 recent 11/09/2020. CAD, Signs/Symptoms:Murmur and Dyspnea; Risk                 Factors:Dyslipidemia and Hypertension.  Sonographer:    Luisa Hart RDCS Referring Phys: [place]  1. Left ventricular ejection fraction, by estimation, is 50 to 55%. The left ventricle has low normal function. The left ventricle has no regional wall motion abnormalities. The left ventricular internal cavity size was mildly dilated. Left ventricular diastolic function could not be evaluated.  2. Right ventricular systolic function is normal. The right ventricular size is normal. There is normal pulmonary artery systolic pressure.  3. The mitral valve is grossly normal. Mild mitral valve regurgitation.  4. There is a trivial PVL present . The aortic valve has been repaired/replaced. Aortic valve regurgitation is trivial. FINDINGS  Left Ventricle: Left ventricular ejection fraction, by estimation, is 50 to 55%. The left ventricle has low normal function. The left ventricle has no regional wall motion abnormalities. The left ventricular internal cavity size was mildly dilated. There is no left ventricular hypertrophy. Left ventricular diastolic function could not be evaluated due to atrial fibrillation. Left ventricular diastolic function could not be evaluated. Right Ventricle: The right ventricular size is normal. Right vetricular wall thickness was not well visualized. Right ventricular systolic function is normal. There is normal pulmonary artery systolic pressure. The tricuspid regurgitant velocity is 2.35 m/s, and with an assumed right atrial pressure of 3 mmHg, the estimated right ventricular systolic pressure is 99991111 mmHg. Left Atrium: Left atrial  size was normal in size. Right Atrium: Right atrial size was normal in size. Pericardium: There is no evidence of pericardial effusion. Mitral Valve: The mitral valve is grossly normal. Mild mitral valve regurgitation. Tricuspid Valve: The tricuspid valve is grossly normal. Tricuspid valve regurgitation is mild. Aortic Valve: There is a trivial PVL present. The aortic valve has been repaired/replaced. Aortic valve regurgitation is trivial. Aortic valve mean gradient measures 7.5 mmHg. Aortic valve peak gradient measures 13.5 mmHg. Aortic valve area, by VTI measures 1.61 cm. There is a Sapien prosthetic, stented (TAVR) valve present in the aortic position. Pulmonic Valve: The pulmonic valve was grossly normal. Pulmonic valve regurgitation is trivial. Aorta: The aortic root and ascending aorta are structurally normal, with no evidence of dilitation. IAS/Shunts: The atrial septum is grossly normal.  LEFT VENTRICLE PLAX 2D LVIDd:         4.70 cm LVIDs:         4.40 cm LV PW:         0.90 cm LV IVS:        1.10 cm LVOT diam:     1.90 cm LV SV:         69 LV SV Index:   32 LVOT Area:     2.84 cm  LV Volumes (MOD) LV vol d, MOD A2C: 49.7 ml LV vol d, MOD A4C: 51.5 ml LV vol s, MOD A2C: 23.1 ml LV vol s, MOD A4C: 32.9 ml LV SV MOD A2C:     26.6 ml LV SV MOD A4C:     51.5 ml LV  SV MOD BP:      25.3 ml RIGHT VENTRICLE RV Basal diam:  3.50 cm RV Mid diam:    2.00 cm TAPSE (M-mode): 2.4 cm LEFT ATRIUM             Index       RIGHT ATRIUM           Index LA diam:        4.70 cm 2.18 cm/m  RA Area:     18.50 cm LA Vol (A2C):   75.5 ml 35.06 ml/m RA Volume:   48.90 ml  22.71 ml/m LA Vol (A4C):   45.5 ml 21.13 ml/m LA Biplane Vol: 63.7 ml 29.58 ml/m  AORTIC VALVE                    PULMONIC VALVE AV Area (Vmax):    1.71 cm     PV Vmax:       0.96 m/s AV Area (Vmean):   1.63 cm     PV Vmean:      61.800 cm/s AV Area (VTI):     1.61 cm     PV VTI:        0.203 m AV Vmax:           184.00 cm/s  PV Peak grad:  3.7 mmHg AV  Vmean:          126.000 cm/s PV Mean grad:  2.0 mmHg AV VTI:            0.431 m AV Peak Grad:      13.5 mmHg AV Mean Grad:      7.5 mmHg LVOT Vmax:         111.00 cm/s LVOT Vmean:        72.300 cm/s LVOT VTI:          0.244 m LVOT/AV VTI ratio: 0.57  AORTA Ao Root diam: 3.00 cm Ao Asc diam:  3.10 cm MITRAL VALVE              TRICUSPID VALVE MV Area (PHT): 1.93 cm   TR Peak grad:   22.1 mmHg MR Peak grad: 35.8 mmHg   TR Vmax:        235.00 cm/s MR Vmax:      299.00 cm/s                           SHUNTS                           Systemic VTI:  0.24 m                           Systemic Diam: 1.90 cm Mertie Moores MD Electronically signed by Mertie Moores MD Signature Date/Time: 01/10/2021/1:36:05 PM    Final    ECHO TEE  Result Date: 01/09/2021    TRANSESOPHOGEAL ECHO REPORT   Patient Name:   Lori Price Date of Exam: 01/09/2021 Medical Rec #:  [id number]   Height:       67.0 in Accession #:    [id number]  Weight:       230.1 lb Date of Birth:  24-Mar-1932   BSA:          2.146 m Patient Age:    30 years    BP:  107/57 mmHg Patient Gender: F           HR:           56 bpm. Exam Location:  Inpatient Procedure: Limited Echo, Color Doppler and Cardiac Doppler Indications:     Aortic Stenosis i35.0  History:         Patient has prior history of Echocardiogram examinations, most                  recent 11/10/2020. CAD; Risk Factors:Hypertension, Dyslipidemia                  and Sleep Apnea.  Sonographer:     Raquel Sarna Senior RDCS Referring Phys:  [place] Diagnosing Phys: Jenkins Rouge MD  Sonographer Comments: 87m Medtronic Evolut Pro+ TAVR PROCEDURE: After discussion of the risks and benefits of a TEE, an informed consent was obtained from the patient. The transesophogeal probe was passed without difficulty through the esophogus of the patient. Sedation performed by different physician. The patient was monitored while under deep sedation. The patient developed no complications during the procedure.  IMPRESSIONS  1. Left ventricular ejection fraction, by estimation, is 55 to 60%. The left ventricle has normal function.  2. Right ventricular systolic function is normal. The right ventricular size is normal.  3. Spontaneous contrast in appendage no thrombus . Left atrial size was moderately dilated. No left atrial/left atrial appendage thrombus was detected.  4. Right atrial size was mildly dilated.  5. The mitral valve is abnormal. Mild mitral valve regurgitation.  6. Tricuspid valve regurgitation is mild to moderate.  7. Pre TAVR: tri leaflet calcified with restricted motion mild AR peak velocity 4.6 m/sec mean gradient 53 peak 85 mmHg AVA 0.72 cm2         Post TAVR: Using a 26 medtronic evolut valve Position looks good. However there was grade 2/4 PVL at 3:00 on PSSX images. The valve was post dilated and degree of PVL improved a bit to grade 1/4 mean gradient 7 peak 12 mmHg with aVR 2.3 cm2 . The aortic valve has been repaired/replaced. Aortic valve regurgitation is mild. FINDINGS  Left Ventricle: Left ventricular ejection fraction, by estimation, is 55 to 60%. The left ventricle has normal function. The left ventricular internal cavity size was normal in size. Right Ventricle: The right ventricular size is normal. No increase in right ventricular wall thickness. Right ventricular systolic function is normal. Left Atrium: Spontaneous contrast in appendage no thrombus. Left atrial size was moderately dilated. No left atrial/left atrial appendage thrombus was detected. Right Atrium: Right atrial size was mildly dilated. Pericardium: There is no evidence of pericardial effusion. Mitral Valve: The mitral valve is abnormal. There is moderate thickening of the mitral valve leaflet(s). There is mild calcification of the mitral valve leaflet(s). Mild mitral annular calcification. Mild mitral valve regurgitation. Tricuspid Valve: The tricuspid valve is normal in structure. Tricuspid valve regurgitation is mild to  moderate. Aortic Valve: Pre TAVR: tri leaflet calcified with restricted motion mild AR peak velocity 4.6 m/sec mean gradient 53 peak 85 mmHg AVA 0.72 cm2 Post TAVR: Using a 26 medtronic evolut valve Position looks good. However there was grade 2/4 PVL at 3:00 on PSSX images. The valve was post dilated and degree of PVL improved a bit to grade 1/4 mean gradient 7 peak 12 mmHg with aVR 2.3 cm2. The aortic valve has been repaired/replaced. Aortic valve regurgitation is mild. Aortic valve mean gradient measures 28.0 mmHg. Aortic valve peak gradient  measures 35.6 mmHg. Aortic valve area, by VTI measures 1.14 cm. Pulmonic Valve: The pulmonic valve was normal in structure. Pulmonic valve regurgitation is mild. Aorta: The aortic root is normal in size and structure. IAS/Shunts: No atrial level shunt detected by color flow Doppler.  LEFT VENTRICLE PLAX 2D LVOT diam:     2.20 cm LV SV:         88 LV SV Index:   41 LVOT Area:     3.80 cm  AORTIC VALVE AV Area (Vmax):    1.31 cm AV Area (Vmean):   1.20 cm AV Area (VTI):     1.14 cm AV Vmax:           298.25 cm/s AV Vmean:          227.250 cm/s AV VTI:            0.767 m AV Peak Grad:      35.6 mmHg AV Mean Grad:      28.0 mmHg LVOT Vmax:         103.10 cm/s LVOT Vmean:        71.900 cm/s LVOT VTI:          0.231 m LVOT/AV VTI ratio: 0.30  SHUNTS Systemic VTI:  0.23 m Systemic Diam: 2.20 cm Jenkins Rouge MD Electronically signed by Jenkins Rouge MD Signature Date/Time: 01/09/2021/4:28:41 PM    Final    Structural Heart Procedure  Result Date: 01/09/2021 See surgical note for result.  Disposition   Pt is being discharged home today in good condition.  Follow-up Plans & Appointments     Follow-up Information     Eileen Stanford, PA-C. Go on 01/18/2021.   Specialties: Cardiology, Radiology Why: @ 2:30pm, please arrive at least 10 minutes early. Contact information: [address] [place] [address] [phone]                 Discharge Instructions     For home use only DME oxygen   Complete by: As directed    Length of Need: 6 Months   Mode or (Route): Nasal cannula   Liters per Minute: 2   Frequency: Continuous (stationary and portable oxygen unit needed)   Oxygen conserving device: Yes   Oxygen delivery system: Gas       Discharge Medications   Allergies as of 01/11/2021       Reactions   Codeine Hives, Itching, Other (See Comments)   extreme agitation   Losartan Other (See Comments)   "I can't walk, I can't do anything"    Oxycodone Hives, Itching   Penicillins Swelling, Rash   Has patient had a PCN reaction causing immediate rash, facial/tongue/throat swelling, SOB or lightheadedness with hypotension: NO Has patient had a PCN reaction causing severe rash involving mucus membranes or skin necrosis: NO Has patient had a PCN reaction that required hospitalization: NO  Has patient had a PCN reaction occurring within the last 10 years: NO If all of the above answers are "NO", then may proceed with Cephalosporin use.   Pentazocine Other (See Comments), Nausea And Vomiting   Quinapril Cough   Sibutramine Palpitations   Sulfamethoxazole Hives, Nausea And Vomiting   hives   Sulfonamide Derivatives Rash   Ace Inhibitors Cough   Ak-mycin [erythromycin] Other (See Comments)   unknown   Drug Ingredient [modified Tree Tyrosine Adsorbate] Other (See Comments)   unknown   Eggs Or Egg-derived Products Hives, Diarrhea, Other (See Comments)  Pt stated "My esophagus swells, I get hives, diarrhea, and I pass out"        Medication List     STOP taking these medications    atenolol-chlorthalidone 100-25 MG tablet Commonly known as: TENORETIC       TAKE these medications    acetaminophen 500 MG tablet Commonly known as: TYLENOL Take 1,000 mg by mouth every 6 (six) hours as needed for moderate pain or mild pain.   apixaban 5 MG Tabs tablet Commonly known as: ELIQUIS Take 1 tablet (5 mg  total) by mouth 2 (two) times daily.   atenolol 50 MG tablet Commonly known as: Tenormin Take 1 tablet (50 mg total) by mouth daily.   atorvastatin 10 MG tablet Commonly known as: LIPITOR Take 1 tablet (10 mg total) by mouth daily.   B-12 2500 MCG Tabs Take 2,500 mcg by mouth 3 (three) times a week.   bisacodyl 5 MG EC tablet Commonly known as: DULCOLAX Take 5-10 mg by mouth daily as needed for moderate constipation.   Breo Ellipta 100-25 MCG/INH Aepb Generic drug: fluticasone furoate-vilanterol Inhale 1 puff into the lungs daily.   chlorthalidone 25 MG tablet Commonly known as: HYGROTON Take 1 tablet (25 mg total) by mouth daily. Start taking on: January 12, 2021   Dry Eye Relief Drops 0.2-0.2-1 % Soln Generic drug: Glycerin-Hypromellose-PEG 400 Place 1 drop into both eyes daily as needed (Dry eye).   fexofenadine 180 MG tablet Commonly known as: ALLEGRA Take 180 mg by mouth daily as needed for allergies.   fluconazole 100 MG tablet Commonly known as: DIFLUCAN Take 2 tablets (200 mg total) by mouth daily.   furosemide 20 MG tablet Commonly known as: LASIX TAKE (1/2) TO (1) TABLET DAILY AS NEEDED. What changed: See the new instructions.   meclizine 25 MG tablet Commonly known as: ANTIVERT Take 25 mg by mouth daily as needed for dizziness.   multivitamin capsule Take 1 capsule by mouth daily. Woman   nystatin ointment Commonly known as: MYCOSTATIN Apply 1 application topically 2 (two) times daily.   POTASSIUM PO Take 1 tablet by mouth daily as needed (cramps).               Durable Medical Equipment  (From admission, onward)           Start     Ordered   01/11/21 0000  For home use only DME oxygen       Question Answer Comment  Length of Need 6 Months   Mode or (Route) Nasal cannula   Liters per Minute 2   Frequency Continuous (stationary and portable oxygen unit needed)   Oxygen conserving device Yes   Oxygen delivery system Gas       01/11/21 1004             Outstanding Labs/Studies   none  Duration of Discharge Encounter   Greater than 30 minutes including physician time.  Mable Fill, PA-C 01/11/2021, 10:07 AM [phone]

## 2021-01-11 NOTE — Telephone Encounter (Addendum)
   Cardiac Monitor Alert  Date of alert:  01/11/2021   ZIO AT notification  First documentation of AFib 10:55am Rate: 51bpm for 90 seconds Patient said she was "feeling fine" per iRhythm  Patient Name: Lori Price  DOB: 10-17-1931  MRN: [id number]   [facility] Cardiologist: Minus Breeding, MD  [facility] EP:  None    Monitor Information: Long Term Monitor-Live Telemetry [ZioAT]  Reason:  AFib Ordering provider:  K. Grandville Silos, PA-C (post-hospital)   :1}  Alert Atrial Fibrillation/Flutter This is the 1st alert for this rhythm.  The patient has a hx of Atrial Fibrillation/Flutter.  The patient is currently on anticoagulation. Anticoagulation medication as of 01/11/2021           apixaban (ELIQUIS) 5 MG TABS tablet Take 1 tablet (5 mg total) by mouth 2 (two) times daily.       Next Cardiology Appointment 1}  Date:  01/18/2021  Provider:  K. Grandville Silos, PA-C  No - patient currently admitted per chart review at time of alert   Fidel Levy, RN  01/11/2021 12:35 PM

## 2021-01-11 NOTE — TOC Transition Note (Signed)
Transition of Care (TOC) - CM/SW Discharge Note Marvetta Gibbons RN, BSN Transitions of Care Unit 4E- RN Case Manager See Treatment Team for direct phone #    Patient Details  Name: Lori Price MRN: [id number] Date of Birth: 11/20/1931  Transition of Care [facility]) CM/SW Contact:  Dawayne Patricia, RN Phone Number: 01/11/2021, 12:58 PM   Clinical Narrative:    Pt stable for transition home, order placed for Home 02 needs.  CM spoke with pt at the bedside- discussed choice for DME agency- pt does not have a preference and is agreeable to using in house provider- [employer].  Also discussed recommendations for HHPT- pt would like to have HHPT with the in house provider where she lives at [place].  Bedside RN checked with rounding team for [facility] order and they state that they will order [facility] when pt returns to office for follow up. No HH referral made at this time.   Call made to [facility] with [facility] for home 02 referral- portable equipment to be delivered to room for transport needs home once varied approved with insurance.    Final next level of care: Home/Self Care Barriers to Discharge: No Barriers Identified   Patient Goals and CMS Choice Patient states their goals for this hospitalization and ongoing recovery are:: return home CMS Medicare.gov Compare Post Acute Care list provided to:: Patient Choice offered to / list presented to : Patient  Discharge Placement                 home      Discharge Plan and Services   Discharge Planning Services: CM Consult Post Acute Care Choice: Home Health, Durable Medical Equipment          DME Arranged: Oxygen DME Agency: [facility] Date DME Agency Contacted: 01/11/21 Time DME Agency Contacted: 108 Representative spoke with at DME Agency: [place]: PT Chaunce[place]: Other - See comment (MD to arrange at post discharge visit-)        Social Determinants of Health ([place]) Interventions     Readmission Risk  Interventions Readmission Risk Prevention Plan 01/11/2021  Post Dischage Appt Complete  Medication Screening Complete  Transportation Screening Complete  Some recent data might be hidden

## 2021-01-11 NOTE — Progress Notes (Signed)
SATURATION QUALIFICATIONS: (This note is used to comply with regulatory documentation for home oxygen)  Patient Saturations on Room Air at Rest = 85%  Patient Saturations on Room Air while Ambulating = 81%  Patient Saturations on 2 Liters of oxygen while Ambulating = 93%  Please briefly explain why patient needs home oxygen:

## 2021-01-11 NOTE — Progress Notes (Signed)
Mobility Specialist: Progress Note   01/11/21 1259  Mobility  Activity Ambulated in hall  Level of Assistance Standby assist, set-up cues, supervision of patient - no hands on  Assistive Device Front wheel walker  Distance Ambulated (ft) 224 ft  Mobility Ambulated with assistance in hallway  Mobility Response Tolerated well  Mobility performed by Mobility specialist  Bed Position Chair  $Mobility charge 1 Mobility   Pt independent STS. Pt stopped for several brief standing breaks due to R shoulder pain. Needed small cueing on posture while ambulating in hall but otherwise asx. Pt returned back to chair w/ family member present in room.   [facility] Irisa Grimsley Mobility Specialist Mobility Specialist Phone: [phone]

## 2021-01-11 NOTE — Progress Notes (Addendum)
CARDIAC REHAB PHASE I   PRE:  Rate/Rhythm: 61 afib LBBB    BP: sitting 130/72    SaO2: 88-93 2L, 85 RA, 93 2L  MODE:  Ambulation: 150 ft   POST:  Rate/Rhythm: 85 afib    BP: sitting 149/55     SaO2: 97 2L  Pt is having episodic hypoxia. Improved with O2. RN placing O2 qualifying note. Ambulated on 2L and rollator. Slow pace due to her back and shoulder. Increased distance and did not complain of SOB. Feels well. Slowly walked around bed in room holding furniture. Left on 2L. Reminders of watching incisions given. N/a for CRPII. [place], ACSM 01/11/2021 8:51 AM

## 2021-01-11 NOTE — Progress Notes (Signed)
2 Days Post-Op Procedure(s) (LRB): TRANSCATHETER AORTIC VALVE REPLACEMENT, LEFT SUBCLAVIAN (Left) TRANSESOPHAGEAL ECHOCARDIOGRAM (TEE) (N/A) Subjective: No complaints. Says she feels well. Ambulating with walker.  Monitor reviewed: atrial fib with vent rate mostly 50-60's. LBBB, Occasionally down into the 40's   Objective: Vital signs in last 24 hours: Temp:  [97.5 F (36.4 C)-97.6 F (36.4 C)] 97.5 F (36.4 C) (08/25 0732) Pulse Rate:  [51-65] 51 (08/25 0732) Cardiac Rhythm: Atrial fibrillation;Bundle branch block (08/24 1939) Resp:  [15-17] 17 (08/25 0732) BP: (99-152)/(59-79) 130/72 (08/25 0732) SpO2:  [92 %-98 %] 98 % (08/25 0732)  Hemodynamic parameters for last 24 hours:    Intake/Output from previous day: 08/24 0701 - 08/25 0700 In: 240 [P.O.:240] Out: 1650 [Urine:1650] Intake/Output this shift: No intake/output data recorded.  General appearance: alert and cooperative Neurologic: intact Heart: irregularly irregular rhythm Lungs: clear to auscultation bilaterally Extremities: extremities normal, atraumatic, no cyanosis or edema Wound: left subclavian incision ok. Right groin site ok.  Lab Results: Recent Labs    01/08/21 0857 01/09/21 1639 01/10/21 0354  WBC 9.5  --  11.6*  HGB 14.6 13.3 13.2  HCT 45.4 39.0 41.8  PLT 198  --  175   BMET:  Recent Labs    01/08/21 0857 01/09/21 1639 01/10/21 0354  NA 136 138 138  K 4.0 5.2* 4.6  CL 103 104 98  CO2 21*  --  29  GLUCOSE 189* 165* 160*  BUN 22 34* 23  CREATININE 0.96 0.80 0.98  CALCIUM 9.4  --  9.4    PT/INR:  Recent Labs    01/08/21 0857  LABPROT 14.3  INR 1.1   ABG    Component Value Date/Time   PHART 7.382 01/08/2021 0956   HCO3 24.7 01/08/2021 0956   TCO2 28 01/09/2021 1639   O2SAT 94.7 01/08/2021 0956   CBG (last 3)  No results for input(s): GLUCAP in the last 72 hours.  Assessment/Plan: S/P Procedure(s) (LRB): TRANSCATHETER AORTIC VALVE REPLACEMENT, LEFT SUBCLAVIAN  (Left) TRANSESOPHAGEAL ECHOCARDIOGRAM (TEE) (N/A)  POD 2  Hemodynamically stable in persistent rate controlled AF. New LBBB postop. ECG pending this am. She says it was done but it is not in computer. No hard copy in chart. Could send her out with a Zio Patch.  2D echo reviewed from yesterday shows normally functioning Medtronic TAVR valve with low mean gradient and trivial paravalvular leak.  She looks good and is anxious to get home.    LOS: 2 days    Gaye Pollack 01/11/2021

## 2021-01-12 ENCOUNTER — Telehealth: Payer: Self-pay | Admitting: Physician Assistant

## 2021-01-12 NOTE — Telephone Encounter (Signed)
  [place] VALVE TEAM   Patient contacted regarding discharge from [facility] on 8/25  Patient understands to follow up with provider Nell Range on 9/1 at [facility].  Patient understands discharge instructions? yes Patient understands medications and regimen? yes Patient understands to bring all medications to this visit? yes  Angelena Form PA-C  [facility]

## 2021-01-18 ENCOUNTER — Other Ambulatory Visit: Payer: Self-pay

## 2021-01-18 ENCOUNTER — Ambulatory Visit (INDEPENDENT_AMBULATORY_CARE_PROVIDER_SITE_OTHER): Payer: Medicare Other | Admitting: Physician Assistant

## 2021-01-18 ENCOUNTER — Encounter: Payer: Self-pay | Admitting: Physician Assistant

## 2021-01-18 VITALS — BP 114/70 | HR 74 | Ht 67.0 in | Wt 229.4 lb

## 2021-01-18 DIAGNOSIS — R0902 Hypoxemia: Secondary | ICD-10-CM

## 2021-01-18 DIAGNOSIS — B372 Candidiasis of skin and nail: Secondary | ICD-10-CM

## 2021-01-18 DIAGNOSIS — I447 Left bundle-branch block, unspecified: Secondary | ICD-10-CM | POA: Diagnosis not present

## 2021-01-18 DIAGNOSIS — I10 Essential (primary) hypertension: Secondary | ICD-10-CM

## 2021-01-18 DIAGNOSIS — Z952 Presence of prosthetic heart valve: Secondary | ICD-10-CM | POA: Diagnosis not present

## 2021-01-18 DIAGNOSIS — I4819 Other persistent atrial fibrillation: Secondary | ICD-10-CM

## 2021-01-18 MED ORDER — AZITHROMYCIN 500 MG PO TABS: ORAL_TABLET | ORAL | 2 refills | Status: DC

## 2021-01-18 NOTE — Progress Notes (Signed)
HEART AND [place]                                     Cardiology Office Note:    Date:  01/18/2021   ID:  RYAH MIRRO, DOB 1931-12-04, MRN [id number]  PCP:  Glenda Chroman, MD  [facility] Cardiologist:  Minus Breeding, MD / Dr. Burt Knack & Dr. Cyndia Bent (TAVR) [facility] Electrophysiologist:  None   Referring MD: Glenda Chroman, MD   [facility] s/p TAVR  History of Present Illness:    Lori Price is a 87 y.o. female with a hx of hypertension, hyperlipidemia carotid artery disease status post right carotid endarterectomy in 2007, peripheral vascular disease, coronary artery disease status post coronary bypass graft surgery by Dr. Darcey Nora in 2001 after myocardial infarction, untreated sleep apnea, and severe aortic stenosis s/p TAVR (01/09/21) who presents to clinic for follow up.   She had an echocardiogram in August 2021 showing moderate to severe aortic stenosis with a mean gradient of 37 mmHg and a valve area by VTI of 0.8 cm.  There is trivial aortic insufficiency.  Left ventricular systolic function was normal with severe LVH.  She was asymptomatic at that time and states she was feeling fine until April 2022 when she began developing progressive exertional fatigue and dyspnea as well as chest pressure.  She has had occasional dizziness but no syncope.  She has had peripheral edema. Repeat echo on 11/09/2020 showed an increase in the mean gradient across aortic valve to 60 mmHg with a peak gradient of 109 mmHg.  Aortic valve area was 0.48 cm.  There was severe thickening and calcification of the aortic valve with restricted mobility.  Left ventricular ejection fraction was normal with severe concentric LVH.  There was moderate right ventricular systolic dysfunction. L/RHC on 12/01/20 showed severe three vessel CAD s/p 4V CABG with 1/4 patent bypass grafts. There was chronic total occlusion of the mid LAD. The mid and distal LAD fills from the patent LIMA  graft. Moderately severe proximal Circumflex stenosis. The vein graft to the Circumflex system is chronically occluded. Chronic occlusion of the proximal RCA. The sequential vein graft to the RV marginal branch and PDA is chronically occluded. The distal RCA and branches fill from right to right and left to right collaterals. There was severe aortic stenosis (mean gradient 49.4 mmHg, Peak to peak gradient 58 mmHg, AVA 0.74 cm2). Medical therapy was recommended for her CAD.    She was evaluated by the multidisciplinary valve team and underwent successful TAVR with a 26 mm  Medtronic Evolut Pro + THV via the left subclavian approach on 01/09/21. Post operative echo showed EF 50%, normally functioning TAVR with a mean gradient of 7.5 mmHg and trivial PVL. She was resumed on home Eliquis alone without concurrent antiplatelet therapy given advance age and high fall risk. She had a new LBBB and slow HRs so atenolol was reduced from '100mg'$  to '50mg'$  daily and she was discharged home with a Zio AT. She was also noted to have hypoxia and discharged home with home 02.   Today the patient presents to clinic for follow up. Here with husband. No CP or SOB. No LE edema, orthopnea or PND. Had has some mild intermittent dizziness similar to previous vertigo but no syncope. No blood in stool or urine. No palpitations. Has right hand numbness.  Can tell a big difference in her energy since TAVR.   Past Medical History:  Diagnosis Date   Anemia    Arthritis    Asthma    Atrial fibrillation, persistent (HCC)    BPV (benign positional vertigo)    Carotid artery disease ([place])    s/p R CEA (2007)   Carotid artery occlusion    Coronary artery disease    s/p MI 2001--(catheterizations in 2001 with left main 70% stenosis, LAD 70% followed by 90% stenosis, circumflex 99% stenosis, right coronary artery occluded.    History of kidney stones    Hyperlipidemia    Hypertension    Morbid obesity ([place])    Nephrolithiasis     Peripheral vascular disease ([place])    Pneumonia    S/P TAVR (transcatheter aortic valve replacement) 01/09/2021   s/p TAVR with a 26 mm Medtronic Evolut Pro+ via the left subclavian approach by Dr. Burt Knack and Dr. Cyndia Bent   Sleep apnea    CPAP   no   Vertigo     Past Surgical History:  Procedure Laterality Date   CARDIAC CATHETERIZATION     2001   CARDIAC SURGERY     CAROTID ENDARTERECTOMY Right    cea  Dr. Amedeo Plenty   CARPAL TUNNEL RELEASE Right    CORONARY ARTERY BYPASS GRAFT     2001 (LIMA to LAD, SVG to obtuse marginal, sequential  SVG to RV , marginal branch in the distal right coronary artery.   EYE SURGERY     bilateral cataracts   knee arthoscopy     left carpal tunnel     ORIF SHOULDER FRACTURE Right 08/30/2013   Procedure: HEMI ARTHROPLASTY;  Surgeon: [name], MD;  Location: [place];  Service: Orthopedics;  Laterality: Right;   right carotid endarterectomy     RIGHT/LEFT HEART CATH AND CORONARY/GRAFT ANGIOGRAPHY N/A 12/01/2020   Procedure: RIGHT/LEFT HEART CATH AND CORONARY/GRAFT ANGIOGRAPHY;  Surgeon: Burnell Blanks, MD;  Location: [place] [facility];  Service: Cardiovascular;  Laterality: N/A;   TEE WITHOUT CARDIOVERSION N/A 01/09/2021   Procedure: TRANSESOPHAGEAL ECHOCARDIOGRAM (TEE);  Surgeon: Sherren Mocha, MD;  Location: [place] CV LAB;  Service: Open Heart Surgery;  Laterality: N/A;   TONSILLECTOMY     VASCULAR SURGERY      Current Medications: Current Meds  Medication Sig   acetaminophen (TYLENOL) 500 MG tablet Take 1,000 mg by mouth every 6 (six) hours as needed for moderate pain or mild pain.   apixaban (ELIQUIS) 5 MG TABS tablet Take 1 tablet (5 mg total) by mouth 2 (two) times daily.   atenolol (TENORMIN) 50 MG tablet Take 1 tablet (50 mg total) by mouth daily.   atorvastatin (LIPITOR) 10 MG tablet Take 1 tablet (10 mg total) by mouth daily.   azithromycin (ZITHROMAX) 500 MG tablet Take one tablet by mouth 60 minutes before any dental  appointments   bisacodyl (DULCOLAX) 5 MG EC tablet Take 5-10 mg by mouth daily as needed for moderate constipation.   BREO ELLIPTA 100-25 MCG/INH AEPB Inhale 1 puff into the lungs daily.   chlorthalidone (HYGROTON) 25 MG tablet Take 1 tablet (25 mg total) by mouth daily.   fexofenadine (ALLEGRA) 180 MG tablet Take 180 mg by mouth daily as needed for allergies.   fluconazole (DIFLUCAN) 100 MG tablet Take 2 tablets (200 mg total) by mouth daily.   furosemide (LASIX) 20 MG tablet TAKE (1/2) TO (1) TABLET DAILY AS NEEDED. (Patient taking differently: Take 10 mg  by mouth daily as needed for edema.)   Glycerin-Hypromellose-PEG 400 (DRY EYE RELIEF DROPS) 0.2-0.2-1 % SOLN Place 1 drop into both eyes daily as needed (Dry eye).   meclizine (ANTIVERT) 25 MG tablet Take 25 mg by mouth daily as needed for dizziness.   Multiple Vitamin (MULTIVITAMIN) capsule Take 1 capsule by mouth daily. Woman   nystatin ointment (MYCOSTATIN) Apply 1 application topically 2 (two) times daily.   POTASSIUM PO Take 1 tablet by mouth daily as needed (cramps).     Allergies:   Codeine, Losartan, Oxycodone, Penicillins, Pentazocine, Quinapril, Sibutramine, Sulfamethoxazole, Sulfonamide derivatives, Ace inhibitors, Ak-mycin [erythromycin], Drug ingredient [modified tree tyrosine adsorbate], and Eggs or egg-derived products   Social History   Socioeconomic History   Marital status: Married    Spouse name: Not on file   Number of children: Not on file   Years of education: Not on file   Highest education level: Not on file  Occupational History   Not on file  Tobacco Use   Smoking status: Never   Smokeless tobacco: Never  Vaping Use   Vaping Use: Never used  Substance and Sexual Activity   Alcohol use: Not Currently   Drug use: Not Currently   Sexual activity: Not on file  Other Topics Concern   Not on file  Social History Narrative   ** Merged History Encounter **       Social Determinants of Health   Financial  Resource Strain: Not on file  Food Insecurity: Not on file  Transportation Needs: Not on file  Physical Activity: Not on file  Stress: Not on file  Social Connections: Not on file     Family History: The patient's family history includes Aneurysm in her father; COPD in her father; Cancer in her mother; Heart disease in her maternal grandfather and maternal grandmother; Kidney disease in her father; Ovarian cancer in her mother.  ROS:   Please see the history of present illness.    All other systems reviewed and are negative.  EKGs/Labs/Other Studies Reviewed:    The following studies were reviewed today:  TAVR OPERATIVE NOTE   Date of Procedure:                01/09/2021   Preoperative Diagnosis:      Severe Aortic Stenosis    Postoperative Diagnosis:    Same    Procedure:        Transcatheter Aortic Valve Replacement - Left Subclavian Artery Approach             Medtronic  Evolut Pro +(size 26 mm,  serial # [id number])              Co-Surgeons:                        Gaye Pollack, MD and Sherren Mocha, MD     Anesthesiologist:                  Therisa Doyne, MD   Echocardiographer:              Edmonia James, MD   Pre-operative Echo Findings: Severe aortic stenosis Normal left ventricular systolic function   Post-operative Echo Findings: Mild paravalvular leak Normal left ventricular systolic function   _____________     Echo 01/10/21:  IMPRESSIONS  1. Left ventricular ejection fraction, by estimation, is 50 to 55%. The  left ventricle has low normal function. The left ventricle has no  regional  wall motion abnormalities. The left ventricular internal cavity size was  mildly dilated. Left ventricular  diastolic function could not be evaluated.   2. Right ventricular systolic function is normal. The right ventricular  size is normal. There is normal pulmonary artery systolic pressure.   3. The mitral valve is grossly normal. Mild mitral valve regurgitation.   4.  There is a trivial PVL present. The aortic valve has been  repaired/replaced. Aortic valve regurgitation is trivial.  EKG:  EKG is  ordered today.  The ekg ordered today demonstrates afib with LBBB and PVC. HR 74 bpm  Recent Labs: 09/29/2020: NT-Pro BNP 1,888 01/08/2021: ALT 24 01/10/2021: BUN 23; Creatinine, Ser 0.98; Hemoglobin 13.2; Magnesium 2.0; Platelets 175; Potassium 4.6; Sodium 138  Recent Lipid Panel    Component Value Date/Time   CHOL 153 02/08/2016 0852   TRIG 213 (H) 02/08/2016 0852   HDL 39 (L) 02/08/2016 0852   CHOLHDL 3.9 02/08/2016 0852   CHOLHDL 7.8 12/14/2015 0542   VLDL UNABLE TO CALCULATE IF TRIGLYCERIDE OVER 400 mg/dL 12/14/2015 0542   LDLCALC 71 02/08/2016 0852   LDLDIRECT 111.8 02/03/2014 0924     Risk Assessment/Calculations:    CHA2DS2-VASc Score = 7  This indicates a 11.2% annual risk of stroke. The patient's score is based upon: CHF History: Yes HTN History: Yes Diabetes History: Yes Stroke History: No Vascular Disease History: Yes Age Score: 2 Gender Score: 1     Physical Exam:    VS:  BP 114/70   Pulse 74   Ht '5\' 7"'$  (1.702 m)   Wt 229 lb 6.4 oz (104.1 kg)   SpO2 93%   BMI 35.93 kg/m     Wt Readings from Last 3 Encounters:  01/18/21 229 lb 6.4 oz (104.1 kg)  01/10/21 231 lb 14.8 oz (105.2 kg)  01/08/21 230 lb 1.6 oz (104.4 kg)     GEN:  Well nourished, well developed in no acute distress, obese HEENT: Normal NECK: No JVD; No carotid bruits LYMPHATICS: No lymphadenopathy CARDIAC: irreg irreg, no murmurs, rubs, gallops RESPIRATORY:  Clear to auscultation without rales, wheezing or rhonchi  ABDOMEN: Soft, non-tender, non-distended MUSCULOSKELETAL:  No edema; No deformity  SKIN: Warm and dry. Groin/subclavian sites clear without hematoma or ecchymosis  NEUROLOGIC:  Alert and oriented x 3 PSYCHIATRIC:  Normal affect   ASSESSMENT:    1. S/P TAVR (transcatheter aortic valve replacement)   2. Persistent atrial fibrillation ([place])    3. LBBB (left bundle branch block)   4. Essential hypertension   5. Candidal skin infection   6. Hypoxia    PLAN:    In order of problems listed above:  Severe AS s/p TAVR: doing great with an improvement in symptoms since TAVR. Groin site and subclavian site healing well. ECG with no HAVB. Continue on Eliquis alone. I will see her back later his month for follow up and echo.    Persistent afib: rate well controlled today. Continue on Eliquis.   LBBB: wearing zio patch with no high risk alerts. HR much improved on reduced dose of atenolol   HTN: BP well controlled. No changes made.    HLD: continue statin   Bilateral candidal groin infections: continue diflucan and nystatin.    Hypoxia: noted during admission. Home 02 ordered.   Right hand numbness: likely due to arterial line and venipuncture. There is a small hematoma there with ecchymosis. This should improve with time  Medication Adjustments/Labs and Tests Ordered: Current medicines are  reviewed at length with the patient today.  Concerns regarding medicines are outlined above.  Orders Placed This Encounter  Procedures   EKG 12-Lead    Meds ordered this encounter  Medications   azithromycin (ZITHROMAX) 500 MG tablet    Sig: Take one tablet by mouth 60 minutes before any dental appointments    Dispense:  4 tablet    Refill:  2    Home delivery please per patient request     Patient Instructions  Medication Instructions:   Your physician has recommended you make the following change in your medication:  1.) azithromycin 500 mg tablet - take one tablet by mouth  *If you need a refill on your cardiac medications before your next appointment, please call your pharmacy*   Lab Work: none If you have labs (blood work) drawn today and your tests are completely normal, you will receive your results only by: [place] (if you have MyChart) OR A paper copy in the mail If you have any lab test that is abnormal or  we need to change your treatment, we will call you to review the results.   Testing/Procedures: none   Follow-Up: As planned       Signed, Lori Form, PA-C  01/18/2021 4:22 PM    [facility]

## 2021-01-18 NOTE — Patient Instructions (Signed)
Medication Instructions:   Your physician has recommended you make the following change in your medication:  1.) azithromycin 500 mg tablet - take one tablet by mouth  *If you need a refill on your cardiac medications before your next appointment, please call your pharmacy*   Lab Work: none If you have labs (blood work) drawn today and your tests are completely normal, you will receive your results only by: [place] (if you have MyChart) OR A paper copy in the mail If you have any lab test that is abnormal or we need to change your treatment, we will call you to review the results.   Testing/Procedures: none   Follow-Up: As planned

## 2021-01-19 ENCOUNTER — Telehealth: Payer: Self-pay | Admitting: Cardiology

## 2021-01-19 DIAGNOSIS — Z09 Encounter for follow-up examination after completed treatment for conditions other than malignant neoplasm: Secondary | ICD-10-CM | POA: Diagnosis not present

## 2021-01-19 DIAGNOSIS — I4891 Unspecified atrial fibrillation: Secondary | ICD-10-CM | POA: Diagnosis not present

## 2021-01-19 DIAGNOSIS — I350 Nonrheumatic aortic (valve) stenosis: Secondary | ICD-10-CM | POA: Diagnosis not present

## 2021-01-19 DIAGNOSIS — I10 Essential (primary) hypertension: Secondary | ICD-10-CM | POA: Diagnosis not present

## 2021-01-19 DIAGNOSIS — J449 Chronic obstructive pulmonary disease, unspecified: Secondary | ICD-10-CM | POA: Diagnosis not present

## 2021-01-19 NOTE — Telephone Encounter (Signed)
  PT is needing an order for rehab @ [facility] in order to be able to complete rehab program... please advise.

## 2021-01-19 NOTE — Telephone Encounter (Signed)
Message routed to Dr. Esperanza Richters. Grandville Silos PA regarding rehab order

## 2021-01-20 DIAGNOSIS — I498 Other specified cardiac arrhythmias: Secondary | ICD-10-CM | POA: Diagnosis not present

## 2021-01-20 DIAGNOSIS — I4819 Other persistent atrial fibrillation: Secondary | ICD-10-CM | POA: Diagnosis not present

## 2021-01-20 DIAGNOSIS — I447 Left bundle-branch block, unspecified: Secondary | ICD-10-CM | POA: Diagnosis not present

## 2021-01-23 NOTE — Telephone Encounter (Signed)
Returned call to [place] who states she cannot take a verbal order for rehab.  She needs specifics as follows:  What type of rehab? Duration of appointment sessions? How many days/week? Length of total rehab - like how many months?  Fax: [place] manager  She requests this on a prescription form but suppose this can be written in a letter format and then faxed  Message sent to MD

## 2021-01-23 NOTE — Telephone Encounter (Signed)
Spoke with [facility] @ [facility] and relayed MD message. She now states she can just accept a letter stating OK for PT and she will let the physical therapist generate the plan. Advised will send on a letterhead, via fax

## 2021-01-23 NOTE — Telephone Encounter (Signed)
Minus Breeding, MD  Fidel Levy, RN Caller: Unspecified (4 days ago,  3:09 PM) I am not familiar enough with the types of rehab and so would defer to her PCP who might know better the times of rehab, duration and intensity.  We are not usually asked for this.

## 2021-01-25 DIAGNOSIS — M6284 Sarcopenia: Secondary | ICD-10-CM | POA: Diagnosis not present

## 2021-01-30 DIAGNOSIS — M6281 Muscle weakness (generalized): Secondary | ICD-10-CM | POA: Diagnosis not present

## 2021-02-01 DIAGNOSIS — M6281 Muscle weakness (generalized): Secondary | ICD-10-CM | POA: Diagnosis not present

## 2021-02-02 DIAGNOSIS — M6281 Muscle weakness (generalized): Secondary | ICD-10-CM | POA: Diagnosis not present

## 2021-02-05 DIAGNOSIS — M6281 Muscle weakness (generalized): Secondary | ICD-10-CM | POA: Diagnosis not present

## 2021-02-07 DIAGNOSIS — M6281 Muscle weakness (generalized): Secondary | ICD-10-CM | POA: Diagnosis not present

## 2021-02-08 ENCOUNTER — Encounter: Payer: Self-pay | Admitting: Physician Assistant

## 2021-02-08 ENCOUNTER — Ambulatory Visit (HOSPITAL_COMMUNITY): Payer: Medicare Other | Attending: Cardiology

## 2021-02-08 ENCOUNTER — Ambulatory Visit (INDEPENDENT_AMBULATORY_CARE_PROVIDER_SITE_OTHER): Payer: Medicare Other | Admitting: Physician Assistant

## 2021-02-08 ENCOUNTER — Other Ambulatory Visit: Payer: Self-pay

## 2021-02-08 VITALS — BP 106/60 | HR 42 | Ht 67.0 in | Wt 231.6 lb

## 2021-02-08 DIAGNOSIS — Z952 Presence of prosthetic heart valve: Secondary | ICD-10-CM | POA: Diagnosis not present

## 2021-02-08 DIAGNOSIS — I10 Essential (primary) hypertension: Secondary | ICD-10-CM

## 2021-02-08 DIAGNOSIS — R0902 Hypoxemia: Secondary | ICD-10-CM

## 2021-02-08 DIAGNOSIS — I6529 Occlusion and stenosis of unspecified carotid artery: Secondary | ICD-10-CM

## 2021-02-08 DIAGNOSIS — E785 Hyperlipidemia, unspecified: Secondary | ICD-10-CM | POA: Diagnosis not present

## 2021-02-08 DIAGNOSIS — I4819 Other persistent atrial fibrillation: Secondary | ICD-10-CM

## 2021-02-08 DIAGNOSIS — I447 Left bundle-branch block, unspecified: Secondary | ICD-10-CM | POA: Diagnosis not present

## 2021-02-08 DIAGNOSIS — I350 Nonrheumatic aortic (valve) stenosis: Secondary | ICD-10-CM | POA: Insufficient documentation

## 2021-02-08 NOTE — Progress Notes (Signed)
[facility] [place]                                     Cardiology Office Note:    Date:  02/09/2021   ID:  Lori Price, DOB 27-Apr-1932, MRN [id number]  PCP:  Glenda Chroman, MD  [facility] Cardiologist:  Minus Breeding, MD / Dr. Burt Knack & Dr. Cyndia Bent (TAVR) [facility] Electrophysiologist:  None   Referring MD: Glenda Chroman, MD   1 month s/p TAVR  History of Present Illness:    Lori Price is a 87 y.o. female with a hx of hypertension, hyperlipidemia carotid artery disease status post right carotid endarterectomy in 2007, peripheral vascular disease, coronary artery disease status post coronary bypass graft surgery by Dr. Darcey Nora in 2001 after myocardial infarction, untreated sleep apnea, and severe aortic stenosis s/p TAVR (01/09/21) who presents to clinic for follow up.   She had an echocardiogram in August 2021 showing moderate to severe aortic stenosis with a mean gradient of 37 mmHg and a valve area by VTI of 0.8 cm.  There is trivial aortic insufficiency.  Left ventricular systolic function was normal with severe LVH.  She was asymptomatic at that time and states she was feeling fine until April 2022 when she began developing progressive exertional fatigue and dyspnea as well as chest pressure.  She has had occasional dizziness but no syncope.  She has had peripheral edema. Repeat echo on 11/09/2020 showed an increase in the mean gradient across aortic valve to 60 mmHg with a peak gradient of 109 mmHg.  Aortic valve area was 0.48 cm.  There was severe thickening and calcification of the aortic valve with restricted mobility.  Left ventricular ejection fraction was normal with severe concentric LVH.  There was moderate right ventricular systolic dysfunction. L/RHC on 12/01/20 showed severe three vessel CAD s/p 4V CABG with 1/4 patent bypass grafts. There was chronic total occlusion of the mid LAD. The mid and distal LAD fills from the patent  LIMA graft. Moderately severe proximal Circumflex stenosis. The vein graft to the Circumflex system is chronically occluded. Chronic occlusion of the proximal RCA. The sequential vein graft to the RV marginal branch and PDA is chronically occluded. The distal RCA and branches fill from right to right and left to right collaterals. There was severe aortic stenosis (mean gradient 49.4 mmHg, Peak to peak gradient 58 mmHg, AVA 0.74 cm2). Medical therapy was recommended for her CAD.    She was evaluated by the multidisciplinary valve team and underwent successful TAVR with a 26 mm  Medtronic Evolut Pro + THV via the left subclavian approach on 01/09/21. Post operative echo showed EF 50%, normally functioning TAVR with a mean gradient of 7.5 mmHg and trivial PVL. She was resumed on home Eliquis alone without concurrent antiplatelet therapy given advance age and high fall risk. She had a new LBBB and slow HRs so atenolol was reduced from 100mg  to 50mg  daily and she was discharged home with a Zio AT. She was also noted to have hypoxia and discharged home with home 02. She has done well since TAVR with an improvement in energy.   Today the patient presents to clinic for follow up. Her with husband. Notices she is not tired all the time anymore. No CP. SOB much improved since TAVR. Chronic mild LE edema. Chronically sleeps in  a recliner but no PND. No dizziness or syncope. No blood in stool or urine. No palpitations.   Past Medical History:  Diagnosis Date   Anemia    Arthritis    Asthma    Atrial fibrillation, persistent (HCC)    BPV (benign positional vertigo)    Carotid artery disease ([place])    s/p R CEA (2007)   Carotid artery occlusion    Coronary artery disease    s/p MI 2001--(catheterizations in 2001 with left main 70% stenosis, LAD 70% followed by 90% stenosis, circumflex 99% stenosis, right coronary artery occluded.    History of kidney stones    Hyperlipidemia    Hypertension    Morbid obesity  ([place])    Nephrolithiasis    Peripheral vascular disease ([place])    Pneumonia    S/P TAVR (transcatheter aortic valve replacement) 01/09/2021   s/p TAVR with a 26 mm Medtronic Evolut Pro+ via the left subclavian approach by Dr. Burt Knack and Dr. Cyndia Bent   Sleep apnea    CPAP   no   Vertigo     Past Surgical History:  Procedure Laterality Date   CARDIAC CATHETERIZATION     2001   CARDIAC SURGERY     CAROTID ENDARTERECTOMY Right    cea  Dr. Amedeo Plenty   CARPAL TUNNEL RELEASE Right    CORONARY ARTERY BYPASS GRAFT     2001 (LIMA to LAD, SVG to obtuse marginal, sequential  SVG to RV , marginal branch in the distal right coronary artery.   EYE SURGERY     bilateral cataracts   knee arthoscopy     left carpal tunnel     ORIF SHOULDER FRACTURE Right 08/30/2013   Procedure: HEMI ARTHROPLASTY;  Surgeon: [name], MD;  Location: [place];  Service: Orthopedics;  Laterality: Right;   right carotid endarterectomy     RIGHT/LEFT HEART CATH AND CORONARY/GRAFT ANGIOGRAPHY N/A 12/01/2020   Procedure: RIGHT/LEFT HEART CATH AND CORONARY/GRAFT ANGIOGRAPHY;  Surgeon: Burnell Blanks, MD;  Location: [place] [facility];  Service: Cardiovascular;  Laterality: N/A;   TEE WITHOUT CARDIOVERSION N/A 01/09/2021   Procedure: TRANSESOPHAGEAL ECHOCARDIOGRAM (TEE);  Surgeon: Sherren Mocha, MD;  Location: [place] [facility];  Service: Open Heart Surgery;  Laterality: N/A;   TONSILLECTOMY     VASCULAR SURGERY      Current Medications: Current Meds  Medication Sig   acetaminophen (TYLENOL) 500 MG tablet Take 1,000 mg by mouth every 6 (six) hours as needed for moderate pain or mild pain.   apixaban (ELIQUIS) 5 MG TABS tablet Take 1 tablet (5 mg total) by mouth 2 (two) times daily.   atorvastatin (LIPITOR) 10 MG tablet Take 1 tablet (10 mg total) by mouth daily.   azithromycin (ZITHROMAX) 500 MG tablet Take one tablet by mouth 60 minutes before any dental appointments   bisacodyl (DULCOLAX) 5 MG EC tablet Take  5-10 mg by mouth daily as needed for moderate constipation.   BREO ELLIPTA 100-25 MCG/INH AEPB Inhale 1 puff into the lungs daily.   chlorthalidone (HYGROTON) 25 MG tablet Take 1 tablet (25 mg total) by mouth daily.   fexofenadine (ALLEGRA) 180 MG tablet Take 180 mg by mouth daily as needed for allergies.   furosemide (LASIX) 20 MG tablet TAKE (1/2) TO (1) TABLET DAILY AS NEEDED. (Patient taking differently: Take 10 mg by mouth daily as needed for edema.)   Glycerin-Hypromellose-PEG 400 (DRY EYE RELIEF DROPS) 0.2-0.2-1 % SOLN Place 1 drop into both eyes daily  as needed (Dry eye).   meclizine (ANTIVERT) 25 MG tablet Take 25 mg by mouth daily as needed for dizziness.   Multiple Vitamin (MULTIVITAMIN) capsule Take 1 capsule by mouth daily. Woman   nystatin ointment (MYCOSTATIN) Apply 1 application topically 2 (two) times daily.   POTASSIUM PO Take 1 tablet by mouth daily as needed (cramps).   [DISCONTINUED] atenolol (TENORMIN) 50 MG tablet Take 1 tablet (50 mg total) by mouth daily.     Allergies:   Codeine, Losartan, Oxycodone, Penicillins, Pentazocine, Quinapril, Sibutramine, Sulfamethoxazole, Sulfonamide derivatives, Ace inhibitors, Ak-mycin [erythromycin], Drug ingredient [modified tree tyrosine adsorbate], and Eggs or egg-derived products   Social History   Socioeconomic History   Marital status: Married    Spouse name: Not on file   Number of children: Not on file   Years of education: Not on file   Highest education level: Not on file  Occupational History   Not on file  Tobacco Use   Smoking status: Never   Smokeless tobacco: Never  Vaping Use   Vaping Use: Never used  Substance and Sexual Activity   Alcohol use: Not Currently   Drug use: Not Currently   Sexual activity: Not on file  Other Topics Concern   Not on file  Social History Narrative   ** Merged History Encounter **       Social Determinants of Health   Financial Resource Strain: Not on file  Food Insecurity:  Not on file  Transportation Needs: Not on file  Physical Activity: Not on file  Stress: Not on file  Social Connections: Not on file     Family History: The patient's family history includes Aneurysm in her father; COPD in her father; Cancer in her mother; Heart disease in her maternal grandfather and maternal grandmother; Kidney disease in her father; Ovarian cancer in her mother.  ROS:   Please see the history of present illness.    All other systems reviewed and are negative.  EKGs/Labs/Other Studies Reviewed:    The following studies were reviewed today:  TAVR OPERATIVE NOTE   Date of Procedure:                01/09/2021   Preoperative Diagnosis:      Severe Aortic Stenosis    Postoperative Diagnosis:    Same    Procedure:        Transcatheter Aortic Valve Replacement - Left Subclavian Artery Approach             Medtronic  Evolut Pro +(size 26 mm,  serial # [id number])              Co-Surgeons:                        Gaye Pollack, MD and Sherren Mocha, MD     Anesthesiologist:                  Therisa Doyne, MD   Echocardiographer:              Edmonia James, MD   Pre-operative Echo Findings: Severe aortic stenosis Normal left ventricular systolic function   Post-operative Echo Findings: Mild paravalvular leak Normal left ventricular systolic function   _____________     Echo 01/10/21:  IMPRESSIONS  1. Left ventricular ejection fraction, by estimation, is 50 to 55%. The  left ventricle has low normal function. The left ventricle has no regional  wall motion abnormalities. The left ventricular  internal cavity size was  mildly dilated. Left ventricular  diastolic function could not be evaluated.   2. Right ventricular systolic function is normal. The right ventricular  size is normal. There is normal pulmonary artery systolic pressure.   3. The mitral valve is grossly normal. Mild mitral valve regurgitation.   4. There is a trivial PVL present. The aortic  valve has been  repaired/replaced. Aortic valve regurgitation is trivial.  ___________________________  Echo 02/08/21 IMPRESSIONS  1. Left ventricular ejection fraction, by estimation, is 50 to 55%. The left ventricle has low normal function. The left ventricle has no regional wall motion abnormalities. There is moderate concentric left ventricular hypertrophy. Left ventricular  diastolic function could not be evaluated.  2. Right ventricular systolic function is moderately reduced. The right ventricular size is moderately enlarged. There is moderately elevated pulmonary artery systolic pressure.  3. Left atrial size was severely dilated.  4. Right atrial size was severely dilated.  5. A small pericardial effusion is present. The pericardial effusion is circumferential.  6. The mitral valve is abnormal. Trivial mitral valve regurgitation. No evidence of mitral stenosis. Moderate mitral annular calcification.  7. The aortic valve has been repaired/replaced. Aortic valve regurgitation is trivial. There is a 26 mm stented (TAVR) valve present in the aortic position. Procedure Date: 01/09/21. Aortic valve area, by VTI measures 2.13 cm. Aortic valve mean gradient  measures 3.6 mmHg. Aortic valve Vmax measures 1.38 m/s.  8. The inferior vena cava is dilated in size with >50% respiratory variability, suggesting right atrial pressure of 8 mmHg.   Comparison(s): Prior images reviewed side by side.   Conclusion(s)/Recommendation(s): S/P TAVR. Valve appears well seated, with trivial paravalvular leak. PVCs noted throughout study. Small circumferential pericardial effusion without evidence of tamponade. Elevated right sided pressures. Bradycardia  noted, HR 30-60 in atrial fibrillation. Findings communicated with Dr. Burt Knack.    EKG:  EKG is NOT ordered today  Recent Labs: 09/29/2020: NT-Pro BNP 1,888 01/08/2021: ALT 24 01/10/2021: BUN 23; Creatinine, Ser 0.98; Hemoglobin 13.2; Magnesium 2.0; Platelets  175; Potassium 4.6; Sodium 138  Recent Lipid Panel    Component Value Date/Time   CHOL 153 02/08/2016 0852   TRIG 213 (H) 02/08/2016 0852   HDL 39 (L) 02/08/2016 0852   CHOLHDL 3.9 02/08/2016 0852   CHOLHDL 7.8 12/14/2015 0542   VLDL UNABLE TO CALCULATE IF TRIGLYCERIDE OVER 400 mg/dL 12/14/2015 0542   LDLCALC 71 02/08/2016 0852   LDLDIRECT 111.8 02/03/2014 0924     Risk Assessment/Calculations:    CHA2DS2-VASc Score = 7  This indicates a 11.2% annual risk of stroke. The patient's score is based upon: CHF History: 1 HTN History: 1 Diabetes History: 1 Stroke History: 0 Vascular Disease History: 1 Age Score: 2 Gender Score: 1     Physical Exam:    VS:  BP 106/60   Pulse (!) 42   Ht 5\' 7"  (1.702 m)   Wt 231 lb 9.6 oz (105.1 kg)   SpO2 92%   BMI 36.27 kg/m     Wt Readings from Last 3 Encounters:  02/08/21 231 lb 9.6 oz (105.1 kg)  01/18/21 229 lb 6.4 oz (104.1 kg)  01/10/21 231 lb 14.8 oz (105.2 kg)     GEN:  Well nourished, well developed in no acute distress, obese HEENT: Normal NECK: No JVD LYMPHATICS: No lymphadenopathy CARDIAC: irreg irreg, no murmurs, rubs, gallops RESPIRATORY:  Clear to auscultation without rales, wheezing or rhonchi  ABDOMEN: Soft, non-tender, non-distended MUSCULOSKELETAL:  No edema;  No deformity  SKIN: Warm and dry. NEUROLOGIC:  Alert and oriented x 3 PSYCHIATRIC:  Normal affect   ASSESSMENT:    1. S/P TAVR (transcatheter aortic valve replacement)   2. Persistent atrial fibrillation ([place])   3. LBBB (left bundle branch block)   4. Essential hypertension   5. Dyslipidemia   6. Hypoxia     PLAN:    In order of problems listed above:  Severe AS s/p TAVR: echo today shows EF 50%, normally functioning TAVR with a mean gradient of 4 mm hg mmHg and trivial PVL. There was mild RV dysfunction and a small pericardial effusion. She has NYHA class II symptoms with a significant improvement in energy and exercise tolerance since TAVR.  SBE prophylaxis discussed; I have RX'd azithromycin due to a PCN allergy. Continue on monotherapy with Eliquis. I will see her back in 1 year for echo and follow up.    Persistent afib: HR is 40s today. Will further decrease atenolol from 50mg  to 25mg  daily. Continue on Eliquis.   LBBB:  HR much improved on reduced dose of atenolol but still slow. Will cut in half again, as before. Wore Zio patch with no HAVB. Final report still pending.    HTN: BP well controlled. No changes made.    HLD: continue statin   Hypoxia: noted during admission for TAVR and Rx'd O2. Now only wearing at night.   Medication Adjustments/Labs and Tests Ordered: Current medicines are reviewed at length with the patient today.  Concerns regarding medicines are outlined above.  No orders of the defined types were placed in this encounter.   No orders of the defined types were placed in this encounter.    Patient Instructions  Medication Instructions:  The current medical regimen is effective;  continue present plan and medications.  *If you need a refill on your cardiac medications before your next appointment, please call your pharmacy*  Follow-Up: At [facility] you and your health needs are our priority.  As part of our continuing mission to provide you with exceptional heart care, we have created designated Provider Care Teams.  These Care Teams include your primary Cardiologist (physician) and Advanced Practice Providers (APPs -  Physician Assistants and Nurse Practitioners) who all work together to provide you with the care you need, when you need it.  We recommend signing up for the patient portal called "MyChart".  Sign up information is provided on this After Visit Summary.  MyChart is used to connect with patients for Virtual Visits (Telemedicine).  Patients are able to view lab/test results, encounter notes, upcoming appointments, etc.  Non-urgent messages can be sent to your provider as well.   To  learn more about what you can do with MyChart, go to [email or web address].    Your next appointment:   1 year(s)  The format for your next appointment:   In Person  Provider:   Nell Range, PA-C  See Dr Percival Spanish at [facility] in 05/2021  Thank you for choosing [facility]!!     Signed, Angelena Form, PA-C  02/09/2021 12:10 PM    [place]

## 2021-02-08 NOTE — Patient Instructions (Signed)
Medication Instructions:  The current medical regimen is effective;  continue present plan and medications.  *If you need a refill on your cardiac medications before your next appointment, please call your pharmacy*  Follow-Up: At [facility], you and your health needs are our priority.  As part of our continuing mission to provide you with exceptional heart care, we have created designated Provider Care Teams.  These Care Teams include your primary Cardiologist (physician) and Advanced Practice Providers (APPs -  Physician Assistants and Nurse Practitioners) who all work together to provide you with the care you need, when you need it.  We recommend signing up for the patient portal called "MyChart".  Sign up information is provided on this After Visit Summary.  MyChart is used to connect with patients for Virtual Visits (Telemedicine).  Patients are able to view lab/test results, encounter notes, upcoming appointments, etc.  Non-urgent messages can be sent to your provider as well.   To learn more about what you can do with MyChart, go to [email or web address].    Your next appointment:   1 year(s)  The format for your next appointment:   In Person  Provider:   Nell Range, PA-C  See Dr Percival Spanish at [facility] in 05/2021  Thank you for choosing [place]!!

## 2021-02-09 ENCOUNTER — Other Ambulatory Visit: Payer: Self-pay | Admitting: Physician Assistant

## 2021-02-09 DIAGNOSIS — M6281 Muscle weakness (generalized): Secondary | ICD-10-CM | POA: Diagnosis not present

## 2021-02-09 LAB — ECHOCARDIOGRAM COMPLETE
AR max vel: 1.94 cm2
AV Area VTI: 2.13 cm2
AV Area mean vel: 2.15 cm2
AV Mean grad: 3.6 mmHg
AV Peak grad: 7.6 mmHg
Ao pk vel: 1.38 m/s
Height: 67 in
S' Lateral: 3.6 cm

## 2021-02-09 MED ORDER — ATENOLOL 50 MG PO TABS: 25.0000 mg | ORAL_TABLET | Freq: Every day | ORAL | 3 refills | Status: DC

## 2021-02-11 NOTE — Progress Notes (Signed)
Reviewed. Small effusion only seen on parasternal imaging. Ok to follow,would obtain a limited repeat echo at follow-up. thx

## 2021-02-12 ENCOUNTER — Other Ambulatory Visit: Payer: Self-pay | Admitting: Physician Assistant

## 2021-02-12 ENCOUNTER — Other Ambulatory Visit: Payer: Self-pay

## 2021-02-12 DIAGNOSIS — R0902 Hypoxemia: Secondary | ICD-10-CM

## 2021-02-12 DIAGNOSIS — I3139 Other pericardial effusion (noninflammatory): Secondary | ICD-10-CM

## 2021-02-12 DIAGNOSIS — R0602 Shortness of breath: Secondary | ICD-10-CM

## 2021-02-12 DIAGNOSIS — I313 Pericardial effusion (noninflammatory): Secondary | ICD-10-CM

## 2021-02-12 NOTE — Progress Notes (Signed)
Limited echo set up for 10/25

## 2021-02-12 NOTE — Progress Notes (Signed)
Limited echo ordered.

## 2021-02-13 NOTE — Addendum Note (Signed)
Encounter addended by: Markus Daft A on: 02/13/2021 8:50 AM  Actions taken: Imaging Exam ended

## 2021-02-14 DIAGNOSIS — M6281 Muscle weakness (generalized): Secondary | ICD-10-CM | POA: Diagnosis not present

## 2021-02-15 ENCOUNTER — Other Ambulatory Visit: Payer: Self-pay

## 2021-02-15 DIAGNOSIS — I6523 Occlusion and stenosis of bilateral carotid arteries: Secondary | ICD-10-CM

## 2021-02-16 DIAGNOSIS — M6281 Muscle weakness (generalized): Secondary | ICD-10-CM | POA: Diagnosis not present

## 2021-02-19 DIAGNOSIS — M6281 Muscle weakness (generalized): Secondary | ICD-10-CM | POA: Diagnosis not present

## 2021-02-20 ENCOUNTER — Ambulatory Visit: Payer: Medicare Other | Admitting: Cardiology

## 2021-02-27 ENCOUNTER — Ambulatory Visit (INDEPENDENT_AMBULATORY_CARE_PROVIDER_SITE_OTHER): Payer: Medicare Other | Admitting: Physician Assistant

## 2021-02-27 ENCOUNTER — Ambulatory Visit (HOSPITAL_COMMUNITY)
Admission: RE | Admit: 2021-02-27 | Discharge: 2021-02-27 | Disposition: A | Discharge status: Home / Self Care | Payer: Medicare Other | Source: Ambulatory Visit | Attending: Vascular Surgery | Admitting: Vascular Surgery

## 2021-02-27 ENCOUNTER — Other Ambulatory Visit: Payer: Self-pay

## 2021-02-27 VITALS — BP 126/72 | HR 60 | Temp 98.0°F | Resp 20 | Ht 67.0 in | Wt 230.1 lb

## 2021-02-27 DIAGNOSIS — I6523 Occlusion and stenosis of bilateral carotid arteries: Secondary | ICD-10-CM

## 2021-02-27 NOTE — Progress Notes (Signed)
History of Present Illness:  Patient is a 87 y.o. year old female who presents for evaluation of carotid stenosis.  S/P right CEA by Dr. Amedeo Plenty on 05/22/05.  The patient denies symptoms of TIA, amaurosis, or stroke.   She has hx of CABG x 4 and is followed by Dr. Percival Spanish for irregular heart rhythm managed with Eliquis.  She has a hx of mild AS by echo in 2018.  The pt is on a statin for cholesterol management.  The pt is on a daily aspirin.   Other AC:  none The pt is on BB for hypertension.   The pt is not diabetic.   Tobacco hx:  never  Past Medical History:  Diagnosis Date   Anemia    Arthritis    Asthma    Atrial fibrillation, persistent (HCC)    BPV (benign positional vertigo)    Carotid artery disease ([place])    s/p R CEA (2007)   Carotid artery occlusion    Coronary artery disease    s/p MI 2001--(catheterizations in 2001 with left main 70% stenosis, LAD 70% followed by 90% stenosis, circumflex 99% stenosis, right coronary artery occluded.    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 Burt Knack and Dr. Cyndia Bent   Sleep apnea    CPAP   no   Vertigo     Past Surgical History:  Procedure Laterality Date   CARDIAC CATHETERIZATION     2001   CARDIAC SURGERY     CAROTID ENDARTERECTOMY Right    cea  Dr. Amedeo Plenty   CARPAL TUNNEL RELEASE Right    CORONARY ARTERY BYPASS GRAFT     2001 (LIMA to LAD, SVG to obtuse marginal, sequential  SVG to RV , marginal branch in the distal right coronary artery.   EYE SURGERY     bilateral cataracts   knee arthoscopy     left carpal tunnel     ORIF SHOULDER FRACTURE Right 08/30/2013   Procedure: HEMI ARTHROPLASTY;  Surgeon: [name], MD;  Location: [facility];  Service:  Orthopedics;  Laterality: Right;   right carotid endarterectomy     RIGHT/LEFT HEART CATH AND CORONARY/GRAFT ANGIOGRAPHY N/A 12/01/2020   Procedure: RIGHT/LEFT HEART CATH AND CORONARY/GRAFT ANGIOGRAPHY;  Surgeon: Burnell Blanks, MD;  Location: [facility];  Service: Cardiovascular;  Laterality: N/A;   TEE WITHOUT CARDIOVERSION N/A 01/09/2021   Procedure: TRANSESOPHAGEAL ECHOCARDIOGRAM (TEE);  Surgeon: Sherren Mocha, MD;  Location: [facility];  Service: Open Heart Surgery;  Laterality: N/A;   TONSILLECTOMY     VASCULAR SURGERY       Social History Social History   Tobacco Use   Smoking status: Never   Smokeless tobacco: Never  Vaping Use   Vaping Use: Never used  Substance Use Topics   Alcohol use: Not Currently   Drug use: Not Currently    Family History Family History  Problem Relation Age of Onset   Ovarian cancer Mother    Cancer Mother        Ovarian   COPD Father    Aneurysm Father        Throat   Kidney disease  Father    Heart disease Maternal Grandmother    Heart disease Maternal Grandfather     Allergies  Allergies  Allergen Reactions   Codeine Hives, Itching and Other (See Comments)    extreme agitation   Losartan Other (See Comments)    "I can't walk, I can't do anything"    Oxycodone Hives and Itching   Penicillins Swelling and Rash    Has patient had a PCN reaction causing immediate rash, facial/tongue/throat swelling, SOB or lightheadedness with hypotension: NO Has patient had a PCN reaction causing severe rash involving mucus membranes or skin necrosis: NO Has patient had a PCN reaction that required hospitalization: NO  Has patient had a PCN reaction occurring within the last 10 years: NO If all of the above answers are "NO", then may proceed with Cephalosporin use.     Pentazocine Other (See Comments) and Nausea And Vomiting   Quinapril Cough   Sibutramine Palpitations   Sulfamethoxazole Hives and Nausea And Vomiting     hives   Sulfonamide Derivatives Rash   Ace Inhibitors Cough   Ak-Mycin [Erythromycin] Other (See Comments)    unknown   Drug Ingredient [Modified Tree Tyrosine Adsorbate] Other (See Comments)    unknown   Eggs Or Egg-Derived Products Hives, Diarrhea and Other (See Comments)    Pt stated "My esophagus swells, I get hives, diarrhea, and I pass out"     Current Outpatient Medications  Medication Sig Dispense Refill   acetaminophen (TYLENOL) 500 MG tablet Take 1,000 mg by mouth every 6 (six) hours as needed for moderate pain or mild pain.     apixaban (ELIQUIS) 5 MG TABS tablet Take 1 tablet (5 mg total) by mouth 2 (two) times daily. 180 tablet 1   atorvastatin (LIPITOR) 10 MG tablet Take 1 tablet (10 mg total) by mouth daily. 90 tablet 3   azithromycin (ZITHROMAX) 500 MG tablet Take one tablet by mouth 60 minutes before any dental appointments 4 tablet 2   bisacodyl (DULCOLAX) 5 MG EC tablet Take 5-10 mg by mouth daily as needed for moderate constipation.     BREO ELLIPTA 100-25 MCG/INH AEPB Inhale 1 puff into the lungs daily.     chlorthalidone (HYGROTON) 25 MG tablet Take 1 tablet (25 mg total) by mouth daily. 90 tablet 3   fexofenadine (ALLEGRA) 180 MG tablet Take 180 mg by mouth daily as needed for allergies.     furosemide (LASIX) 20 MG tablet TAKE (1/2) TO (1) TABLET DAILY AS NEEDED. (Patient taking differently: Take 10 mg by mouth daily as needed for edema.) 45 tablet 10   Glycerin-Hypromellose-PEG 400 (DRY EYE RELIEF DROPS) 0.2-0.2-1 % SOLN Place 1 drop into both eyes daily as needed (Dry eye).     meclizine (ANTIVERT) 25 MG tablet Take 25 mg by mouth daily as needed for dizziness.     Multiple Vitamin (MULTIVITAMIN) capsule Take 1 capsule by mouth daily. Woman     nystatin ointment (MYCOSTATIN) Apply 1 application topically 2 (two) times daily. 30 g 3   POTASSIUM PO Take 1 tablet by mouth daily as needed (cramps).     No current facility-administered medications for this visit.     ROS:   General:  No weight loss, Fever, chills  HEENT: No recent headaches, no nasal bleeding, no visual changes, no sore throat  Neurologic: No dizziness, blackouts, seizures. No recent symptoms of stroke or mini- stroke. No recent episodes of slurred speech, or temporary blindness.  Cardiac: No recent episodes  of chest pain/pressure, no shortness of breath at rest.  No shortness of breath with exertion.  Denies history of atrial fibrillation or irregular heartbeat  Vascular: No history of rest pain in feet.  No history of claudication.  No history of non-healing ulcer, No history of DVT   Pulmonary: No home oxygen, no productive cough, no hemoptysis,  No asthma or wheezing  Musculoskeletal:  [ ]  Arthritis, [ ]  Low back pain,  [ ]  Joint pain  Hematologic:No history of hypercoagulable state.  No history of easy bleeding.  No history of anemia  Gastrointestinal: No hematochezia or melena,  No gastroesophageal reflux, no trouble swallowing  Urinary: [ ]  chronic Kidney disease, [ ]  on HD - [ ]  MWF or [ ]  TTHS, [ ]  Burning with urination, [ ]  Frequent urination, [ ]  Difficulty urinating;   Skin: No rashes  Psychological: No history of anxiety,  No history of depression   Physical Examination  Vitals:   02/27/21 1137 02/27/21 1138  BP: 125/77 126/72  Pulse: 60   Resp: 20   Temp: 98 F (36.7 C)   TempSrc: Temporal   SpO2: 92%   Weight: 230 lb 1.6 oz (104.4 kg)   Height: 5\' 7"  (1.702 m)     Body mass index is 36.04 kg/m.  General:  Alert and oriented, no acute distress HEENT: Normal Neck: No bruit or JVD Pulmonary: Clear to auscultation bilaterally Cardiac: Regular Rate and Rhythm without murmur Gastrointestinal: Soft, non-tender, non-distended, no mass, no scars Musculoskeletal: No deformity or edema  Neurologic: Upper and lower extremity motor 5/5 and symmetric  DATA:     Right Carotid Findings:   +----------+--------+--------+--------+------------------+-----------------  +            PSV cm/sEDV cm/sStenosisPlaque DescriptionComments            +----------+--------+--------+--------+------------------+-----------------  +  CCA Prox  101     21                                tortuous            +----------+--------+--------+--------+------------------+-----------------  +  CCA Mid   88      18                                                    +----------+--------+--------+--------+------------------+-----------------  +  CCA Distal71      10                                                    +----------+--------+--------+--------+------------------+-----------------  +  ICA Prox  135     36      1-39%   heterogenous      high end of  range  +----------+--------+--------+--------+------------------+-----------------  +  ICA Mid   93      18                                                    +----------+--------+--------+--------+------------------+-----------------  +  ICA Distal54      14                                                    +----------+--------+--------+--------+------------------+-----------------  +  ECA       89      0                                                     +----------+--------+--------+--------+------------------+-----------------  +   +----------+--------+-------+--------+-------------------+            PSV cm/sEDV cmsDescribeArm Pressure (mmHG)  +----------+--------+-------+--------+-------------------+  [id number]     13     Stenotic                     +----------+--------+-------+--------+-------------------+   +---------+--------+--+--------+-+---------+  VertebralPSV cm/s26EDV cm/s4Antegrade  +---------+--------+--+--------+-+---------+       Left Carotid Findings:  +----------+--------+--------+--------+------------------+--------+             PSV cm/sEDV cm/sStenosisPlaque DescriptionComments  +----------+--------+--------+--------+------------------+--------+  CCA Prox  100     10                                          +----------+--------+--------+--------+------------------+--------+  CCA Mid   93      11                                          +----------+--------+--------+--------+------------------+--------+  CCA Distal70      12                                          +----------+--------+--------+--------+------------------+--------+  ICA Prox  55      14      1-39%   heterogenous                +----------+--------+--------+--------+------------------+--------+  ICA Mid   79      16                                          +----------+--------+--------+--------+------------------+--------+  ICA Distal69      18                                          +----------+--------+--------+--------+------------------+--------+  ECA       96      0                                           +----------+--------+--------+--------+------------------+--------+   +----------+--------+--------+----------------+-------------------+            PSV cm/sEDV cm/sDescribe  Arm Pressure (mmHG)  +----------+--------+--------+----------------+-------------------+  [id number]             Multiphasic, WNL                     +----------+--------+--------+----------------+-------------------+   +---------+--------+--+--------+-+---------+  VertebralPSV cm/s36EDV cm/s5Antegrade  +---------+--------+--+--------+-+---------+    Summary:  Right Carotid: Velocities in the right ICA are consistent with a 1-39%  stenosis.   Left Carotid: Velocities in the left ICA are consistent with a 1-39%  stenosis.   Vertebrals:  Bilateral vertebral arteries demonstrate antegrade flow.  Subclavians: Right subclavian artery was stenotic. Normal flow  hemodynamics were                seen in the left subclavian artery.    ASSESSMENT:  S/P right CEA by Dr. Amedeo Plenty 05/22/2005 F/U asymptomatic carotid stenosis B ICA < 39% and she remains asymptomatic.  PLAN: She will f/u in 2 years for repeat carotid duplex.  If she develops symptoms of stroke or TIA she will call 911.     Roxy Horseman PA-C [facility] Office: [phone]  MD in clinic [place]

## 2021-03-08 DIAGNOSIS — Z952 Presence of prosthetic heart valve: Secondary | ICD-10-CM | POA: Diagnosis not present

## 2021-03-08 DIAGNOSIS — D6869 Other thrombophilia: Secondary | ICD-10-CM | POA: Diagnosis not present

## 2021-03-08 DIAGNOSIS — E785 Hyperlipidemia, unspecified: Secondary | ICD-10-CM | POA: Diagnosis not present

## 2021-03-08 DIAGNOSIS — I350 Nonrheumatic aortic (valve) stenosis: Secondary | ICD-10-CM | POA: Diagnosis not present

## 2021-03-08 DIAGNOSIS — Z1339 Encounter for screening examination for other mental health and behavioral disorders: Secondary | ICD-10-CM | POA: Diagnosis not present

## 2021-03-08 DIAGNOSIS — D692 Other nonthrombocytopenic purpura: Secondary | ICD-10-CM | POA: Diagnosis not present

## 2021-03-08 DIAGNOSIS — I10 Essential (primary) hypertension: Secondary | ICD-10-CM | POA: Diagnosis not present

## 2021-03-08 DIAGNOSIS — I4891 Unspecified atrial fibrillation: Secondary | ICD-10-CM | POA: Diagnosis not present

## 2021-03-08 DIAGNOSIS — Z1331 Encounter for screening for depression: Secondary | ICD-10-CM | POA: Diagnosis not present

## 2021-03-08 DIAGNOSIS — I25810 Atherosclerosis of coronary artery bypass graft(s) without angina pectoris: Secondary | ICD-10-CM | POA: Diagnosis not present

## 2021-03-13 ENCOUNTER — Other Ambulatory Visit: Payer: Self-pay

## 2021-03-13 ENCOUNTER — Ambulatory Visit (HOSPITAL_COMMUNITY): Payer: Medicare Other | Attending: Internal Medicine

## 2021-03-13 DIAGNOSIS — R0902 Hypoxemia: Secondary | ICD-10-CM | POA: Diagnosis not present

## 2021-03-13 DIAGNOSIS — R0602 Shortness of breath: Secondary | ICD-10-CM | POA: Diagnosis not present

## 2021-03-13 LAB — ECHOCARDIOGRAM LIMITED
AV Mean grad: 6.7 mmHg
AV Peak grad: 12 mmHg
Ao pk vel: 1.73 m/s
S' Lateral: 2.8 cm

## 2021-03-13 MED ORDER — PERFLUTREN LIPID MICROSPHERE
1.0000 mL | INTRAVENOUS | Status: AC | PRN
  Administered 2021-03-13: 2 mL via INTRAVENOUS

## 2021-05-04 DIAGNOSIS — I509 Heart failure, unspecified: Secondary | ICD-10-CM | POA: Diagnosis not present

## 2021-05-04 DIAGNOSIS — J449 Chronic obstructive pulmonary disease, unspecified: Secondary | ICD-10-CM | POA: Diagnosis not present

## 2021-05-04 DIAGNOSIS — I10 Essential (primary) hypertension: Secondary | ICD-10-CM | POA: Diagnosis not present

## 2021-05-04 DIAGNOSIS — R062 Wheezing: Secondary | ICD-10-CM | POA: Diagnosis not present

## 2021-05-04 DIAGNOSIS — Z9981 Dependence on supplemental oxygen: Secondary | ICD-10-CM | POA: Diagnosis not present

## 2021-05-04 DIAGNOSIS — R0602 Shortness of breath: Secondary | ICD-10-CM | POA: Diagnosis not present

## 2021-05-04 DIAGNOSIS — J302 Other seasonal allergic rhinitis: Secondary | ICD-10-CM | POA: Diagnosis not present

## 2021-05-04 DIAGNOSIS — R051 Acute cough: Secondary | ICD-10-CM | POA: Diagnosis not present

## 2021-05-07 DIAGNOSIS — D72829 Elevated white blood cell count, unspecified: Secondary | ICD-10-CM | POA: Diagnosis not present

## 2021-05-07 DIAGNOSIS — R093 Abnormal sputum: Secondary | ICD-10-CM | POA: Diagnosis not present

## 2021-05-07 DIAGNOSIS — R2243 Localized swelling, mass and lump, lower limb, bilateral: Secondary | ICD-10-CM | POA: Diagnosis not present

## 2021-05-07 DIAGNOSIS — R61 Generalized hyperhidrosis: Secondary | ICD-10-CM | POA: Diagnosis not present

## 2021-05-07 DIAGNOSIS — R0602 Shortness of breath: Secondary | ICD-10-CM | POA: Diagnosis not present

## 2021-05-07 DIAGNOSIS — J449 Chronic obstructive pulmonary disease, unspecified: Secondary | ICD-10-CM | POA: Diagnosis not present

## 2021-05-07 DIAGNOSIS — R059 Cough, unspecified: Secondary | ICD-10-CM | POA: Diagnosis not present

## 2021-05-09 DIAGNOSIS — R059 Cough, unspecified: Secondary | ICD-10-CM | POA: Diagnosis not present

## 2021-05-09 DIAGNOSIS — R600 Localized edema: Secondary | ICD-10-CM | POA: Diagnosis not present

## 2021-05-09 DIAGNOSIS — J189 Pneumonia, unspecified organism: Secondary | ICD-10-CM | POA: Diagnosis not present

## 2021-05-17 IMAGING — CT CT CERVICAL SPINE W/O CM
4 series · 15 of 33 positions shown, 18 images · non-contrast
Comparison: Head CT dated 12/13/2015.

CLINICAL DATA: 88-year-old female with trauma.

EXAM:
CT HEAD WITHOUT CONTRAST
CT CERVICAL SPINE WITHOUT CONTRAST
TECHNIQUE: Multidetector CT imaging of the head and cervical spine was
performed following the standard protocol without intravenous
contrast. Multiplanar CT image reconstructions of the cervical spine
were also generated.

[Series 5: c spine soft · axial · 0.31mm/px · z∈[-221,-191]mm · 2 of 92 slices shown]
[im 16/92  soft-tissue]
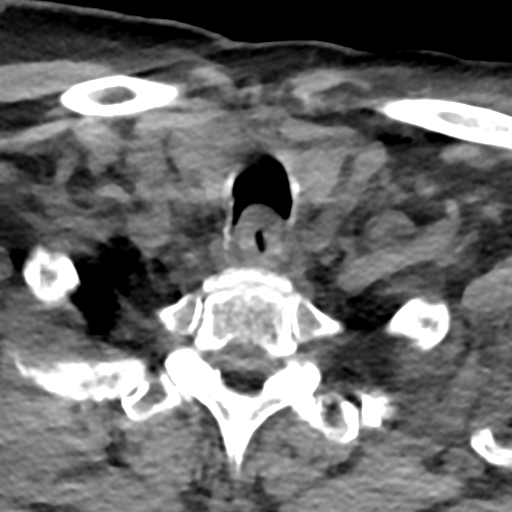
[im 31/92  soft-tissue]
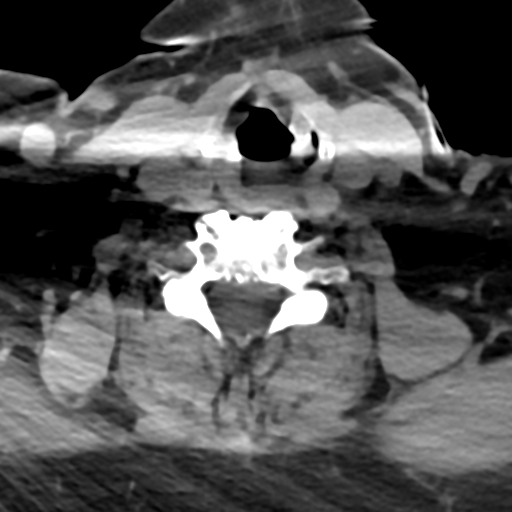

[Series 8: sag bone · sagittal · 0.38mm/px · 5 of 59 slices shown, 6 images]
[im 20/59  bone]
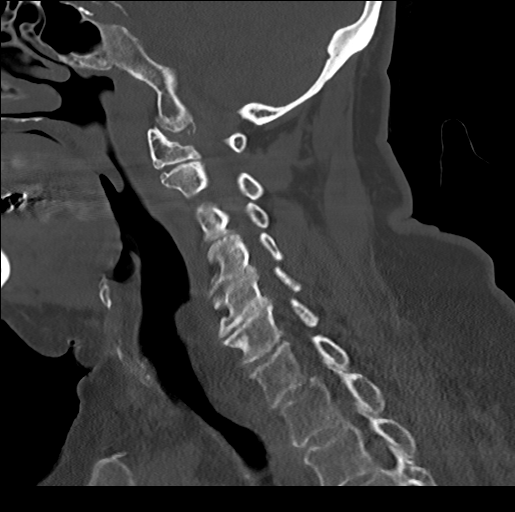
[im 25/59  bone]
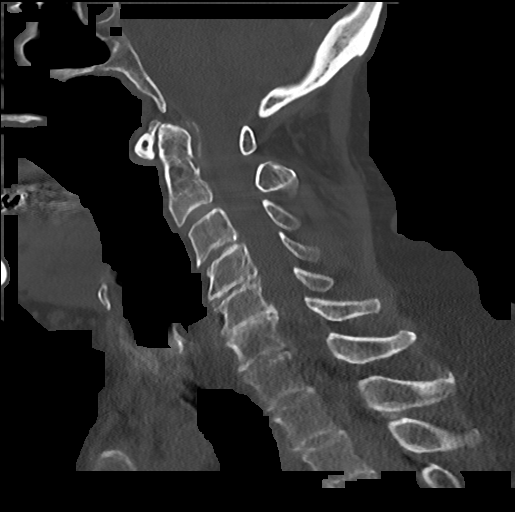
[im 30/59  soft-tissue]
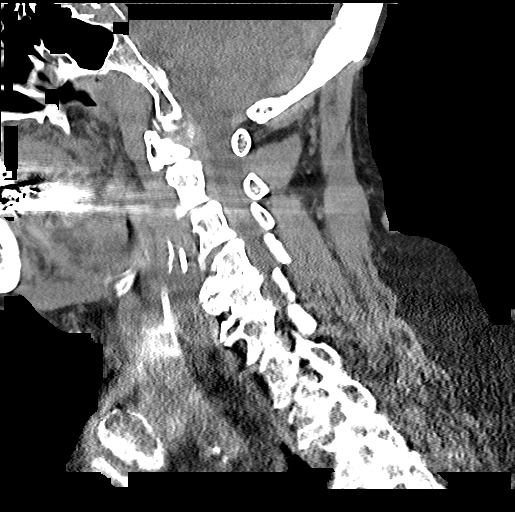
[im 30/59  bone]
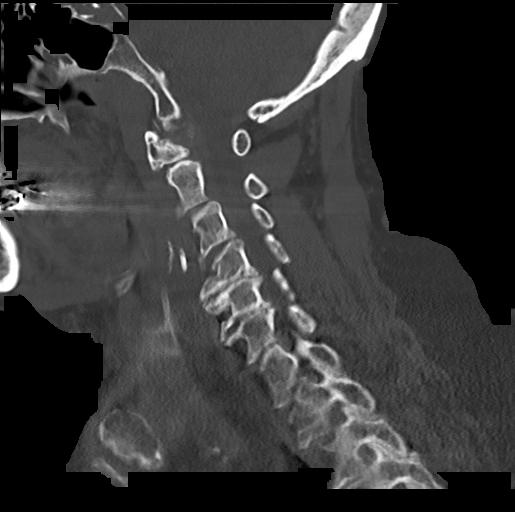
[im 34/59  bone]
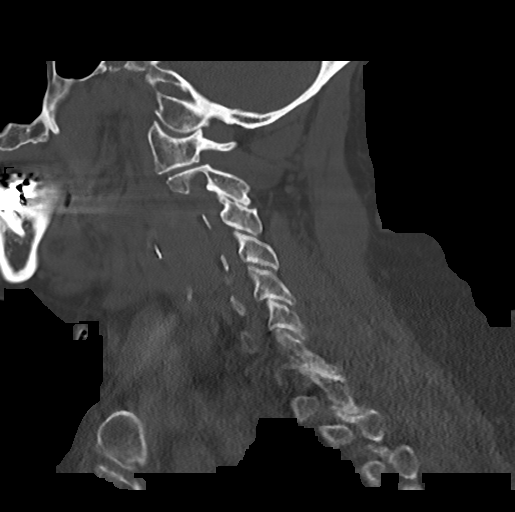
[im 39/59  bone]
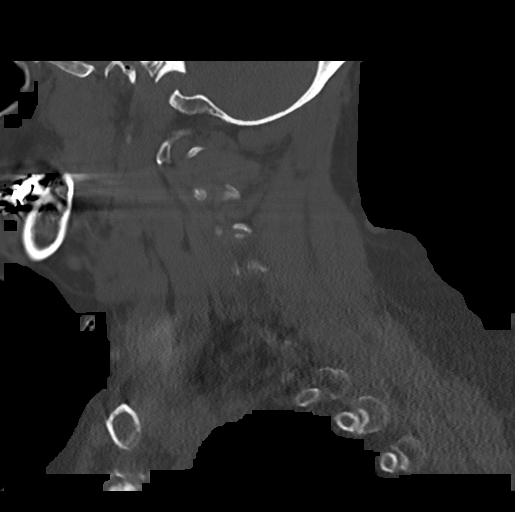

[Series 9: cor bone · coronal · 0.38mm/px · 3 of 63 slices shown]
[im 13/63  bone]
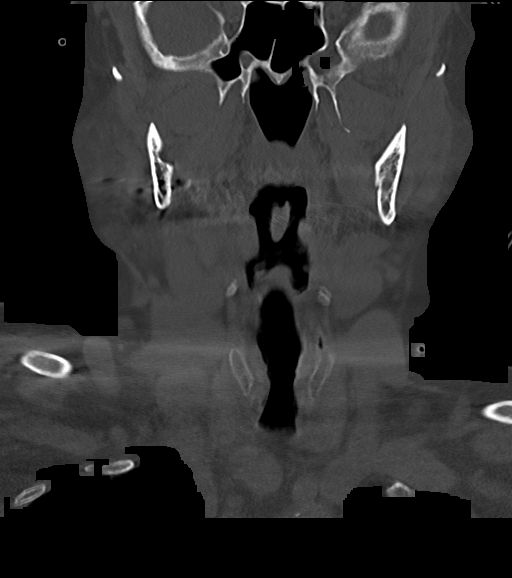
[im 25/63  bone]
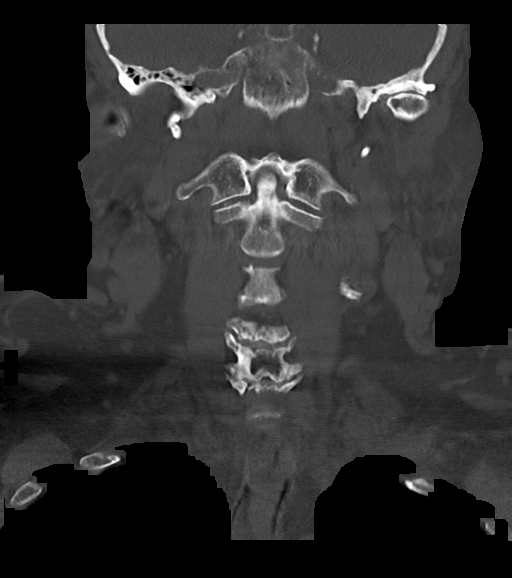
[im 38/63  bone]
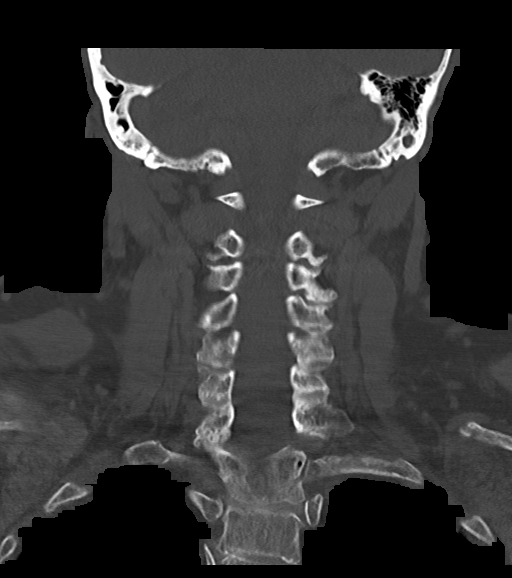

[Series 10: orthogonal axials · axial · 0.21mm/px · z∈[-240,-144]mm · 5 of 88 slices shown, 7 images]
[im 15/88  soft-tissue]
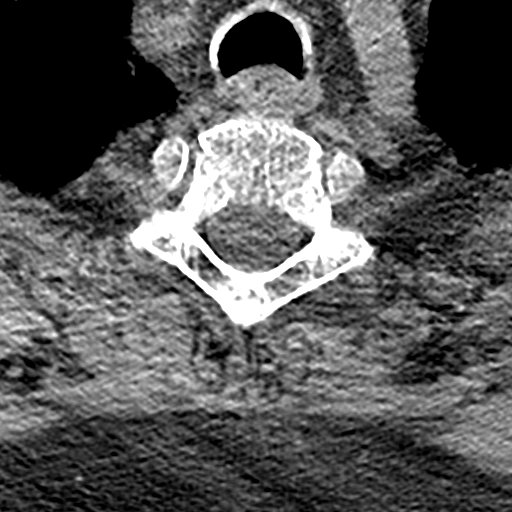
[im 15/88  bone]
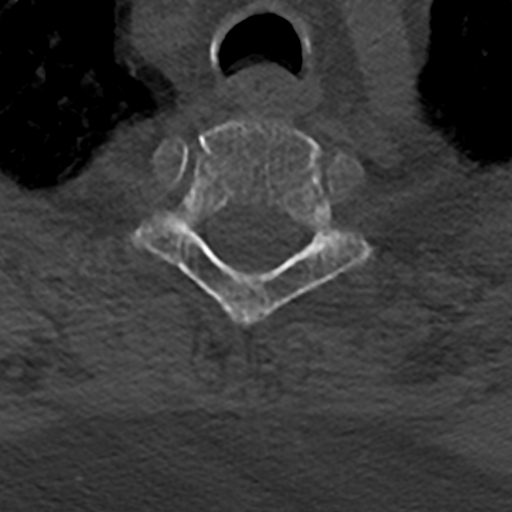
[im 30/88  bone]
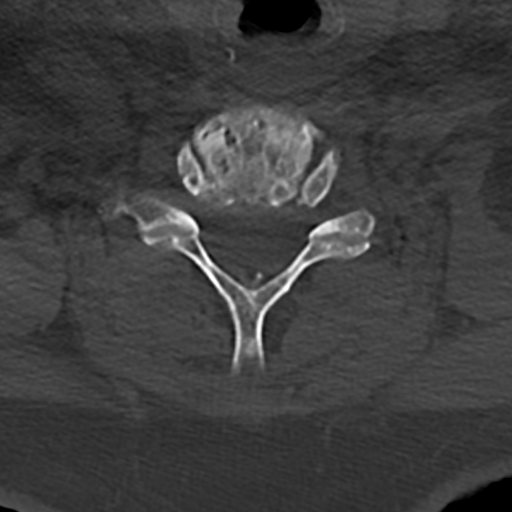
[im 44/88  bone]
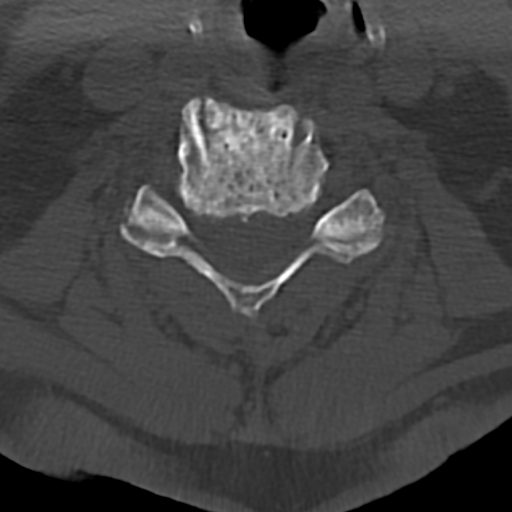
[im 59/88  bone]
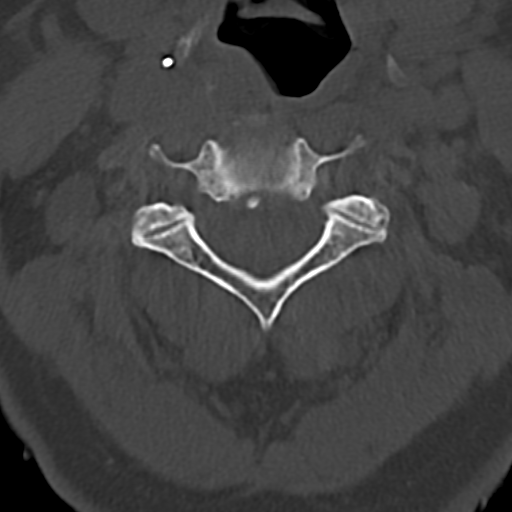
[im 73/88  soft-tissue]
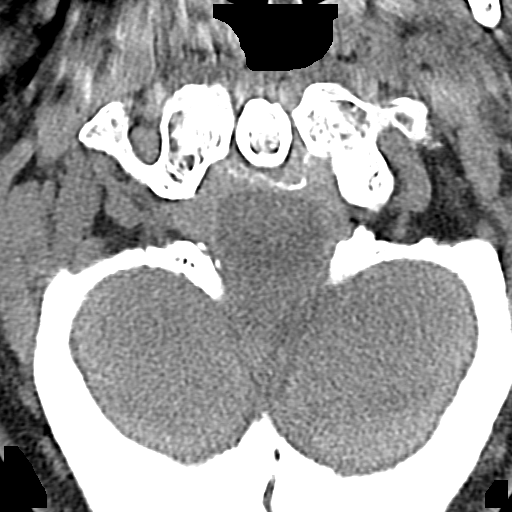
[im 73/88  bone]
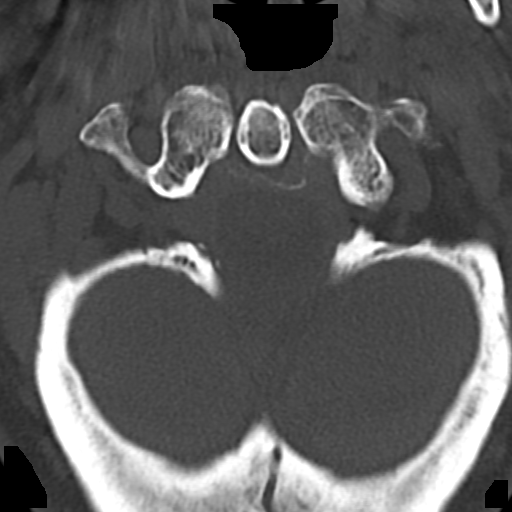

[15 of 33 positions shown; findings below may reference images not displayed]

FINDINGS: CT HEAD FINDINGS

Brain: The ventricles and sulci appropriate size for patient's age.
Subcentimeter hypodense focus in the pons ([DATE]) likely related to
an old infarct. Clinical correlation is recommended. The gray-white
matter discrimination is otherwise preserved. There is no acute
intracranial hemorrhage. No mass effect or midline shift. No
extra-axial fluid collection.

Vascular: No hyperdense vessel or unexpected calcification.

Skull: Normal. Negative for fracture or focal lesion.

Sinuses/Orbits: Mild mucoperiosteal thickening of paranasal sinuses.
The mastoid air cells are clear. No air-fluid level.

Other: Left posterior parietal scalp laceration and small hematoma.
Stitches noted.

CT CERVICAL SPINE FINDINGS

Alignment: No acute subluxation. There is straightening of normal
cervical lordosis which may be positional or due to muscle spasm.

Skull base and vertebrae: No acute fracture.  Osteopenia.

Soft tissues and spinal canal: No prevertebral fluid or swelling. No
visible canal hematoma.

Disc levels:  Multilevel degenerative changes.

Upper chest: Negative.

Other: Bilateral carotid bulb calcified plaques.
IMPRESSION: 1. No acute intracranial pathology.
2. No acute/traumatic cervical spine pathology.

## 2021-05-22 DIAGNOSIS — L309 Dermatitis, unspecified: Secondary | ICD-10-CM | POA: Diagnosis not present

## 2021-05-22 DIAGNOSIS — L918 Other hypertrophic disorders of the skin: Secondary | ICD-10-CM | POA: Diagnosis not present

## 2021-05-22 DIAGNOSIS — D485 Neoplasm of uncertain behavior of skin: Secondary | ICD-10-CM | POA: Diagnosis not present

## 2021-05-22 DIAGNOSIS — Z85828 Personal history of other malignant neoplasm of skin: Secondary | ICD-10-CM | POA: Diagnosis not present

## 2021-06-03 NOTE — Progress Notes (Signed)
Cardiology Office Note   Date:  06/05/2021   ID:  Lori Price, Lori Price 01-07-1932, MRN [id number]  PCP:  Velna Hatchet, MD  Cardiologist:   Minus Breeding, MD   Chief Complaint  Patient presents with   Coronary Artery Disease       History of Present Illness: Lori Price is a 88 y.o. female who presents for follow up of CAD/CABG.    She was in the hospital in July of 2017 with a TIA.  She has had a hypertensive urgency  A sleep study had severe oxygen desaturations and severe periodic limb movements. However, there were some scheduling issues and she never had her CPAP.  I tried to convince her to pursue treatment of this. However, she has not wanted to pursue this.  She had critical AS.  She is status post TAVR.    She has done very well since she had her procedure.  She lives now at [facility].  She says that she has much more energy.  She is not sleeping all the time.  She did have what was termed a COPD flare though she does not think she has that diagnosis.  She had a lot of coughing in December and was managed for this but this has resolved.  She is getting around with her walker because of her orthopedic issues.  But she has no acute cardiovascular complaints.The patient denies any new symptoms such as chest discomfort, neck or arm discomfort. There has been no new shortness of breath, PND or orthopnea. There have been no reported palpitations, presyncope or syncope.   She sleeps chronically in a chair because of shoulder problems.  Past Medical History:  Diagnosis Date   Anemia    Arthritis    Asthma    Atrial fibrillation, persistent (HCC)    BPV (benign positional vertigo)    Carotid artery disease ([place])    s/p R CEA (2007)   Carotid artery occlusion    Coronary artery disease    s/p MI 2001--(catheterizations in 2001 with left main 70% stenosis, LAD 70% followed by 90% stenosis, circumflex 99% stenosis, right coronary artery occluded.    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 Burt Knack and Dr. Cyndia Bent   Sleep apnea    CPAP   no   Vertigo     Past Surgical History:  Procedure Laterality Date   CARDIAC CATHETERIZATION     2001   CARDIAC SURGERY     CAROTID ENDARTERECTOMY Right    cea  Dr. Amedeo Plenty   CARPAL TUNNEL RELEASE Right    CORONARY ARTERY BYPASS GRAFT     2001 (LIMA to LAD, SVG to obtuse marginal, sequential  SVG to RV , marginal branch in the distal right coronary artery.   EYE SURGERY     bilateral cataracts   knee arthoscopy     left carpal tunnel     ORIF SHOULDER FRACTURE Right 08/30/2013   Procedure: HEMI ARTHROPLASTY;  Surgeon: [name], MD;  Location: [facility];  Service: Orthopedics;  Laterality: Right;   right carotid endarterectomy     RIGHT/LEFT HEART CATH AND CORONARY/GRAFT ANGIOGRAPHY N/A 12/01/2020  Procedure: RIGHT/LEFT HEART CATH AND CORONARY/GRAFT ANGIOGRAPHY;  Surgeon: Burnell Blanks, MD;  Location: [facility];  Service: Cardiovascular;  Laterality: N/A;   TEE WITHOUT CARDIOVERSION N/A 01/09/2021   Procedure: TRANSESOPHAGEAL ECHOCARDIOGRAM (TEE);  Surgeon: Sherren Mocha, MD;  Location: [facility];  Service: Open Heart Surgery;  Laterality: N/A;   TONSILLECTOMY     VASCULAR SURGERY       Current Outpatient Medications  Medication Sig Dispense Refill   acetaminophen (TYLENOL) 500 MG tablet Take 1,000 mg by mouth every 6 (six) hours as needed for moderate pain or mild pain.     apixaban (ELIQUIS) 5 MG TABS tablet Take 1 tablet (5 mg total) by mouth 2 (two) times daily. 180 tablet 1   atenolol (TENORMIN) 25 MG tablet Take 1 tablet (25 mg total) by mouth daily. 90 tablet 3   atorvastatin (LIPITOR) 10 MG tablet Take 1 tablet (10 mg  total) by mouth daily. 90 tablet 3   bisacodyl (DULCOLAX) 5 MG EC tablet Take 5-10 mg by mouth daily as needed for moderate constipation.     BREO ELLIPTA 100-25 MCG/INH AEPB Inhale 1 puff into the lungs daily.     chlorthalidone (HYGROTON) 25 MG tablet Take 1 tablet (25 mg total) by mouth daily. 90 tablet 3   fexofenadine (ALLEGRA) 180 MG tablet Take 180 mg by mouth daily as needed for allergies.     furosemide (LASIX) 20 MG tablet TAKE 1/2 TABLET DAILY AND AN ADDITIONAL 1/2 AS NEEDED FOR FLUID 60 tablet 3   Glycerin-Hypromellose-PEG 400 (DRY EYE RELIEF DROPS) 0.2-0.2-1 % SOLN Place 1 drop into both eyes daily as needed (Dry eye).     ipratropium-albuterol (DUONEB) 0.5-2.5 (3) MG/3ML SOLN as needed.     meclizine (ANTIVERT) 25 MG tablet Take 25 mg by mouth daily as needed for dizziness.     Multiple Vitamin (MULTIVITAMIN) capsule Take 1 capsule by mouth daily. Woman     nystatin ointment (MYCOSTATIN) Apply 1 application topically 2 (two) times daily. 30 g 3   POTASSIUM PO Take 1 tablet by mouth daily as needed (cramps).     No current facility-administered medications for this visit.    Allergies:   Codeine, Losartan, Oxycodone, Penicillins, Pentazocine, Quinapril, Sibutramine, Sulfamethoxazole, Sulfonamide derivatives, Ace inhibitors, Ak-mycin [erythromycin], Drug ingredient [modified tree tyrosine adsorbate], and Eggs or egg-derived products    ROS:  Please see the history of present illness.   Otherwise, review of systems are positive for hemorrhoid.   All other systems are reviewed and negative.    PHYSICAL EXAM: VS:  BP (!) 96/58    Pulse (!) 41    Ht 5\' 7"  (1.702 m)    Wt 230 lb 12.8 oz (104.7 kg)    SpO2 95%    BMI 36.15 kg/m  , BMI Body mass index is 36.15 kg/m. GEN:  No distress NECK:  No jugular venous distention at 90 degrees, waveform within normal limits, carotid upstroke brisk and symmetric, no bruits, no thyromegaly LYMPHATICS:  No cervical adenopathy LUNGS:  Clear to  auscultation bilaterally BACK:  No CVA tenderness CHEST:  Well healed sternotomy scar. HEART:  S1 and S2 within normal limits, no S3, no S4, no clicks, no rubs, 2 out of 6 apical systolic murmur brief and early peaking, no diastolic murmurs ABD:  Positive bowel sounds normal in frequency in pitch, no bruits, no rebound, no guarding, unable to assess midline mass or bruit with the patient seated. EXT:  2  plus pulses throughout, mild right greater than left leg edema, no cyanosis no clubbing SKIN:  No rashes no nodules NEURO:  Cranial nerves II through XII grossly intact, motor grossly intact throughout PSYCH:  Cognitively intact, oriented to person place and time   EKG:  EKG is not ordered today. NA   CATH:   Diagnostic Dominance: Right    Recent Labs: 09/29/2020: NT-Pro BNP 1,888 01/08/2021: ALT 24 01/10/2021: BUN 23; Creatinine, Ser 0.98; Hemoglobin 13.2; Magnesium 2.0; Platelets 175; Potassium 4.6; Sodium 138    Lipid Panel    Component Value Date/Time   CHOL 153 02/08/2016 0852   TRIG 213 (H) 02/08/2016 0852   HDL 39 (L) 02/08/2016 0852   CHOLHDL 3.9 02/08/2016 0852   CHOLHDL 7.8 12/14/2015 0542   VLDL UNABLE TO CALCULATE IF TRIGLYCERIDE OVER 400 mg/dL 12/14/2015 0542   LDLCALC 71 02/08/2016 0852   LDLDIRECT 111.8 02/03/2014 0924      Wt Readings from Last 3 Encounters:  06/05/21 230 lb 12.8 oz (104.7 kg)  02/27/21 230 lb 1.6 oz (104.4 kg)  02/08/21 231 lb 9.6 oz (105.1 kg)      Other studies Reviewed: Additional studies/ records that were reviewed today include: Labs from primary care office. Review of the above records demonstrates:    Please see elsewhere in the note.     ASSESSMENT AND PLAN:  ATRIAL FIB: The patient's atrial fibrillation is new.  She needs to be on anticoagulation as her Lori Price has a CHA2DS2 - VASc score of 5.  She tolerates anticoagulation and has had reasonable rate control.  No change in therapy.  She had a normal CBC.  She is  otherwise up-to-date and with her creatinine is on the appropriate dose of Eliquis.  CAD:  The patient has no new sypmtoms.  No further cardiovascular testing is indicated.  We will continue with aggressive risk reduction and meds as listed.  HTN:  The blood pressure is running low but she seems to tolerate this.  No change in therapy.  SLEEP APNEA:    She has not wanted therapy.  CAROTID STENOSIS:    This is followed by [facility]  DYSLIPIDEMIA:    LDL was was 94.  No change in therapy.   TAVR:  This was normal on follow echo in Sept.   She will have follow-up in the [facility] clinic in September.  Current medicines are reviewed at length with the patient today.  The patient does not have concerns regarding medicines.  The following changes have been made: None  Labs/ tests ordered today include:   None  No orders of the defined types were placed in this encounter.   Disposition:   Follow-up with me 1 year   Signed, Minus Breeding, MD  06/05/2021 2:05 PM    [facility]

## 2021-06-05 ENCOUNTER — Ambulatory Visit (INDEPENDENT_AMBULATORY_CARE_PROVIDER_SITE_OTHER): Payer: Medicare Other | Admitting: Cardiology

## 2021-06-05 ENCOUNTER — Other Ambulatory Visit: Payer: Self-pay

## 2021-06-05 ENCOUNTER — Encounter: Payer: Self-pay | Admitting: Cardiology

## 2021-06-05 VITALS — BP 96/58 | HR 41 | Ht 67.0 in | Wt 230.8 lb

## 2021-06-05 DIAGNOSIS — I10 Essential (primary) hypertension: Secondary | ICD-10-CM | POA: Diagnosis not present

## 2021-06-05 DIAGNOSIS — Z952 Presence of prosthetic heart valve: Secondary | ICD-10-CM

## 2021-06-05 DIAGNOSIS — I2510 Atherosclerotic heart disease of native coronary artery without angina pectoris: Secondary | ICD-10-CM | POA: Diagnosis not present

## 2021-06-05 DIAGNOSIS — G459 Transient cerebral ischemic attack, unspecified: Secondary | ICD-10-CM | POA: Diagnosis not present

## 2021-06-05 MED ORDER — CHLORTHALIDONE 25 MG PO TABS: 25.0000 mg | ORAL_TABLET | Freq: Every day | ORAL | 3 refills | Status: DC

## 2021-06-05 MED ORDER — APIXABAN 5 MG PO TABS: 5.0000 mg | ORAL_TABLET | Freq: Two times a day (BID) | ORAL | 1 refills | Status: DC

## 2021-06-05 MED ORDER — ATENOLOL 25 MG PO TABS: 25.0000 mg | ORAL_TABLET | Freq: Every day | ORAL | 3 refills | Status: DC

## 2021-06-05 MED ORDER — FUROSEMIDE 20 MG PO TABS: ORAL_TABLET | ORAL | 3 refills | Status: DC

## 2021-06-05 NOTE — Patient Instructions (Signed)
Medication Instructions:  Your physician recommends that you continue on your current medications as directed. Please refer to the Current Medication list given to you today.   *If you need a refill on your cardiac medications before your next appointment, please call your pharmacy*  Lab Work: NONE  Testing/Procedures: NONE  Follow-Up: At [facility], you and your health needs are our priority.  As part of our continuing mission to provide you with exceptional heart care, we have created designated Provider Care Teams.  These Care Teams include your primary Cardiologist (physician) and Advanced Practice Providers (APPs -  Physician Assistants and Nurse Practitioners) who all work together to provide you with the care you need, when you need it.  We recommend signing up for the patient portal called "MyChart".  Sign up information is provided on this After Visit Summary.  MyChart is used to connect with patients for Virtual Visits (Telemedicine).  Patients are able to view lab/test results, encounter notes, upcoming appointments, etc.  Non-urgent messages can be sent to your provider as well.   To learn more about what you can do with MyChart, go to [email or web address].    Your next appointment:   12 month(s)  The format for your next appointment:   In Person  Provider:   Minus Breeding, MD

## 2021-07-18 DIAGNOSIS — L299 Pruritus, unspecified: Secondary | ICD-10-CM | POA: Diagnosis not present

## 2021-07-18 DIAGNOSIS — R600 Localized edema: Secondary | ICD-10-CM | POA: Diagnosis not present

## 2021-07-18 DIAGNOSIS — L309 Dermatitis, unspecified: Secondary | ICD-10-CM | POA: Diagnosis not present

## 2021-08-29 ENCOUNTER — Encounter: Payer: Self-pay | Admitting: Nurse Practitioner

## 2021-08-29 ENCOUNTER — Ambulatory Visit (HOSPITAL_COMMUNITY)
Admission: RE | Admit: 2021-08-29 | Discharge: 2021-08-29 | Disposition: A | Discharge status: Home / Self Care | Payer: Medicare Other | Source: Ambulatory Visit | Attending: Cardiology | Admitting: Cardiology

## 2021-08-29 ENCOUNTER — Ambulatory Visit (INDEPENDENT_AMBULATORY_CARE_PROVIDER_SITE_OTHER): Payer: Medicare Other | Admitting: Nurse Practitioner

## 2021-08-29 VITALS — BP 132/62 | HR 115 | Ht 67.0 in | Wt 233.4 lb

## 2021-08-29 DIAGNOSIS — G4733 Obstructive sleep apnea (adult) (pediatric): Secondary | ICD-10-CM | POA: Diagnosis not present

## 2021-08-29 DIAGNOSIS — I2510 Atherosclerotic heart disease of native coronary artery without angina pectoris: Secondary | ICD-10-CM | POA: Diagnosis not present

## 2021-08-29 DIAGNOSIS — I4819 Other persistent atrial fibrillation: Secondary | ICD-10-CM | POA: Insufficient documentation

## 2021-08-29 DIAGNOSIS — I6523 Occlusion and stenosis of bilateral carotid arteries: Secondary | ICD-10-CM | POA: Insufficient documentation

## 2021-08-29 DIAGNOSIS — R600 Localized edema: Secondary | ICD-10-CM

## 2021-08-29 DIAGNOSIS — I447 Left bundle-branch block, unspecified: Secondary | ICD-10-CM | POA: Diagnosis not present

## 2021-08-29 DIAGNOSIS — I493 Ventricular premature depolarization: Secondary | ICD-10-CM

## 2021-08-29 DIAGNOSIS — M79604 Pain in right leg: Secondary | ICD-10-CM

## 2021-08-29 DIAGNOSIS — R0989 Other specified symptoms and signs involving the circulatory and respiratory systems: Secondary | ICD-10-CM

## 2021-08-29 DIAGNOSIS — I495 Sick sinus syndrome: Secondary | ICD-10-CM | POA: Insufficient documentation

## 2021-08-29 DIAGNOSIS — I10 Essential (primary) hypertension: Secondary | ICD-10-CM | POA: Diagnosis not present

## 2021-08-29 DIAGNOSIS — Z952 Presence of prosthetic heart valve: Secondary | ICD-10-CM | POA: Insufficient documentation

## 2021-08-29 DIAGNOSIS — E785 Hyperlipidemia, unspecified: Secondary | ICD-10-CM

## 2021-08-29 DIAGNOSIS — I4811 Longstanding persistent atrial fibrillation: Secondary | ICD-10-CM

## 2021-08-29 MED ORDER — POTASSIUM CHLORIDE ER 10 MEQ PO TBCR: 10.0000 meq | EXTENDED_RELEASE_TABLET | Freq: Every day | ORAL | 3 refills | Status: DC

## 2021-08-29 MED ORDER — FUROSEMIDE 40 MG PO TABS: 40.0000 mg | ORAL_TABLET | Freq: Every day | ORAL | 3 refills | Status: DC

## 2021-08-29 NOTE — Progress Notes (Signed)
? ? ?Office Visit  ?  ?Patient Name: Lori Price ?Date of Encounter: 08/29/2021 ? ?Primary Care Provider:  Velna Hatchet, MD ?Primary Cardiologist:  Minus Breeding, MD ? ?Chief Complaint  ?  ?88 year old female with a history of CAD s/p CABG x4 in 2001, aortic valve stenosis s/p TAVR in 12/2020, persistent atrial fibrillation on Eliquis, carotid artery stenosis s/p R CEA in 2007, hypertension, hyperlipidemia, OSA not on CPAP, vertigo, asthma, and arthritis who presents for follow-up related to atrial fibrillation. ? ?Past Medical History  ?  ?Past Medical History:  ?Diagnosis Date  ? Anemia   ? Arthritis   ? Asthma   ? Atrial fibrillation, persistent ([place])   ? BPV (benign positional vertigo)   ? Carotid artery disease ([place])   ? s/p R CEA (2007)  ? Carotid artery occlusion   ? Coronary artery disease   ? s/p MI 2001--(catheterizations in 2001 with left main 70% stenosis, LAD 70% followed by 90% stenosis, circumflex 99% stenosis, right coronary artery occluded.   ? History of kidney stones   ? Hyperlipidemia   ? Hypertension   ? Morbid obesity ([place])   ? Nephrolithiasis   ? Peripheral vascular disease ([place])   ? Pneumonia   ? S/P TAVR (transcatheter aortic valve replacement) 01/09/2021  ? s/p TAVR with a 26 mm Medtronic Evolut Pro+ via the left subclavian approach by Dr. Burt Knack and Dr. Cyndia Bent  ? Sleep apnea   ? CPAP   no  ? Vertigo   ? ?Past Surgical History:  ?Procedure Laterality Date  ? CARDIAC CATHETERIZATION    ? 2001  ? CARDIAC SURGERY    ? CAROTID ENDARTERECTOMY Right   ? cea  Dr. Amedeo Plenty  ? CARPAL TUNNEL RELEASE Right   ? CORONARY ARTERY BYPASS GRAFT    ? 2001 (LIMA to LAD, SVG to obtuse marginal, sequential  SVG to RV , marginal branch in the distal right coronary artery.  ? EYE SURGERY    ? bilateral cataracts  ? knee arthoscopy    ? left carpal tunnel    ? ORIF SHOULDER FRACTURE Right 08/30/2013  ? Procedure: HEMI ARTHROPLASTY;  Surgeon: [name], MD;  Location: [facility];  Service: Orthopedics;   Laterality: Right;  ? right carotid endarterectomy    ? RIGHT/LEFT HEART CATH AND CORONARY/GRAFT ANGIOGRAPHY N/A 12/01/2020  ? Procedure: RIGHT/LEFT HEART CATH AND CORONARY/GRAFT ANGIOGRAPHY;  Surgeon: Burnell Blanks, MD;  Location: [facility];  Service: Cardiovascular;  Laterality: N/A;  ? TEE WITHOUT CARDIOVERSION N/A 01/09/2021  ? Procedure: TRANSESOPHAGEAL ECHOCARDIOGRAM (TEE);  Surgeon: Sherren Mocha, MD;  Location: [facility];  Service: Open Heart Surgery;  Laterality: N/A;  ? TONSILLECTOMY    ? VASCULAR SURGERY    ? ? ?Allergies ? ?Allergies  ?Allergen Reactions  ? Codeine Hives, Itching and Other (See Comments)  ?  extreme agitation  ? Losartan Other (See Comments)  ?  "I can't walk, I can't do anything"   ? Oxycodone Hives and Itching  ? Penicillins Swelling and Rash  ?  Has patient had a PCN reaction causing immediate rash, facial/tongue/throat swelling, SOB or lightheadedness with hypotension: NO ?Has patient had a PCN reaction causing severe rash involving mucus membranes or skin necrosis: NO ?Has patient had a PCN reaction that required hospitalization: NO  ?Has patient had a PCN reaction occurring within the last 10 years: NO ?If all of the above answers are "NO", then may proceed with Cephalosporin use. ? ?  ?  Pentazocine Other (See Comments) and Nausea And Vomiting  ? Quinapril Cough  ? Sibutramine Palpitations  ? Sulfamethoxazole Hives and Nausea And Vomiting  ?  hives  ? Sulfonamide Derivatives Rash  ? Ace Inhibitors Cough  ? Ak-Mycin [Erythromycin] Other (See Comments)  ?  unknown  ? Drug Ingredient [Modified Tree Tyrosine Adsorbate] Other (See Comments)  ?  unknown  ? Eggs Or Egg-Derived Products Hives, Diarrhea and Other (See Comments)  ?  Pt stated "My esophagus swells, I get hives, diarrhea, and I pass out"  ? ? ?History of Present Illness  ?  ?88 year old female with the above past medical history including CAD s/p CABG x4 in 2001, aortic valve stenosis s/p TAVR in  12/2020, persistent atrial fibrillation on Eliquis, carotid artery stenosis s/p R CEA in 2007, hypertension, hyperlipidemia, OSA not on CPAP, vertigo, asthma, and arthritis. ? ?She has a history of CAD s/p CABG x 4 in 2001. She was hospitalized in July 2017 in the setting of TIA.  She has a history of OSA, and has repeatedly declined CPAP therapy.  Echo in August 2020 showed moderate to severe aortic stenosis. She was initially asymptomatic, however, she developed progressive exertional fatigue and dyspnea in April 2022.  Repeat echo in June 2022 shows an increase in mean gradient of the aortic valve from 37 mmHg on previous echo to 60 mmHg, severe thickening and calcification of the aortic valve with restricted mobility, severe aortic stenosis, normal EF, and severe concentric LVH, moderate RV systolic dysfunction. Cardiac catheterization in July 2022 showed severe three-vessel CAD s/p CABG x4, with 1/4 patent bypass grafts, chronic total occlusion of mid LAD, mid and distal LAD fills from patent LIMA graft, moderately severe proximal circumflex stenosis, with chronically occluded vein graft to circumflex system, chronic occlusion of proximal RCA, with chronically occluded vein graft to RV marginal branch and PDA, distal RCA branch filling from right to right and left-to-right collaterals.  Medical therapy was recommended for CAD.  She underwent successful TAVR in August 2022.   ? ?Additionally, she has a history of persistent atrial fibrillation, rate controlled, on Eliquis. Outpatient cardiac monitor in September 2022 showed continuous atrial fibrillation, frequent ventricular ectopy, a 5 beat run of ventricular tachycardia, tachybradycardia rhythm (HR 30-170 bpm), beta-blocker was decreased at the time in the setting of bradycardia.  Echocardiogram post TAVR in September 2022 showed normally functioning TAVR, mean gradient 4 mmHg, small pericardial effusion.  Repeat echo in October 2022 showed resolution of  pericardial effusion. Carotid dopplers in October 2022 showed 1 to 39% B ICA stenosis, followed by [facility].  She was last seen in the office on 06/05/2021 and was stable overall from a cardiac standpoint.  Atenolol was decreased further in the setting of bradycardia.  Follow-up was recommended in 1 year.  ? ?She presents today for follow-up.  Since her last visit she reports a 2 to 3-week history of increased bilateral lower extremity edema, however, over the last 10 days, she has noticed increased swelling in the right leg, redness, and pain to the RLE, particularly when walking.  She denies chest pain, dyspnea, weight gain, PND, orthopnea.  Reports adherence to Eliquis.  She is overall active, however, she is frustrated that her lower extremity edema and pain, have recently limited her activity.  Other than her swelling in her legs, and RLE pain, she denies any additional concerns today. ? ?Home Medications  ?  ?Current Outpatient Medications  ?Medication Sig Dispense Refill  ? acetaminophen (TYLENOL) 500  MG tablet Take 1,000 mg by mouth every 6 (six) hours as needed for moderate pain or mild pain.    ? apixaban (ELIQUIS) 5 MG TABS tablet Take 1 tablet (5 mg total) by mouth 2 (two) times daily. 180 tablet 1  ? atenolol (TENORMIN) 25 MG tablet Take 1 tablet (25 mg total) by mouth daily. 90 tablet 3  ? atorvastatin (LIPITOR) 10 MG tablet Take 1 tablet (10 mg total) by mouth daily. 90 tablet 3  ? bisacodyl (DULCOLAX) 5 MG EC tablet Take 5-10 mg by mouth daily as needed for moderate constipation.    ? BREO ELLIPTA 100-25 MCG/INH AEPB Inhale 1 puff into the lungs daily.    ? chlorthalidone (HYGROTON) 25 MG tablet Take 1 tablet (25 mg total) by mouth daily. 90 tablet 3  ? fexofenadine (ALLEGRA) 180 MG tablet Take 180 mg by mouth daily as needed for allergies.    ? furosemide (LASIX) 40 MG tablet Take 1 tablet (40 mg total) by mouth daily. 90 tablet 3  ? Glycerin-Hypromellose-PEG 400 (DRY EYE RELIEF DROPS) 0.2-0.2-1 % SOLN  Place 1 drop into both eyes daily as needed (Dry eye).    ? ipratropium-albuterol (DUONEB) 0.5-2.5 (3) MG/3ML SOLN as needed.    ? meclizine (ANTIVERT) 25 MG tablet Take 25 mg by mouth daily as needed for dizziness.

## 2021-08-29 NOTE — Patient Instructions (Signed)
Medication Instructions:  ?Increase furosemide to 40 mg daily. ?Start taking potassium 10 mEq daily. ?*If you need a refill on your cardiac medications before your next appointment, please call your pharmacy* ? ? ?Testing/Procedures: ?Your physician has requested that you have a lower extremity (bilateral) venous duplex. This test is an ultrasound of the veins in the legs. It looks at venous blood flow that carries blood from the heart to the legs or arms. Allow one hour for a Lower Venous exam. There are no restrictions or special instructions. ?Be at [facility] by noon (12 pm) on Thursday 08/30/2021. ? ?Follow-Up: ?At [facility], you and your health needs are our priority.  As part of our continuing mission to provide you with exceptional heart care, we have created designated Provider Care Teams.  These Care Teams include your primary Cardiologist (physician) and Advanced Practice Providers (APPs -  Physician Assistants and Nurse Practitioners) who all work together to provide you with the care you need, when you need it. ? ?We recommend signing up for the patient portal called "MyChart".  Sign up information is provided on this After Visit Summary.  MyChart is used to connect with patients for Virtual Visits (Telemedicine).  Patients are able to view lab/test results, encounter notes, upcoming appointments, etc.  Non-urgent messages can be sent to your provider as well.   ?To learn more about what you can do with MyChart, go to [email or web address].   ? ?Your next appointment:   ?1 week(s) ? ?The format for your next appointment:   ?In Person ? ?Provider:   ?Diona Browner, NP     ? ? ?Other Instructions ?Each morning, after voiding and before eating, weight yourself. If your weight increases by 3 pounds in a day or 5 pounds in a week, call our office. ?You have been referred to the electrophysiology clinic regarding your heart rate and rhythm. ? ?

## 2021-08-30 ENCOUNTER — Encounter (HOSPITAL_COMMUNITY): Payer: Medicare Other

## 2021-08-30 DIAGNOSIS — E785 Hyperlipidemia, unspecified: Secondary | ICD-10-CM | POA: Diagnosis not present

## 2021-08-30 DIAGNOSIS — I10 Essential (primary) hypertension: Secondary | ICD-10-CM | POA: Diagnosis not present

## 2021-08-30 DIAGNOSIS — M79671 Pain in right foot: Secondary | ICD-10-CM | POA: Diagnosis not present

## 2021-08-30 DIAGNOSIS — H6123 Impacted cerumen, bilateral: Secondary | ICD-10-CM | POA: Diagnosis not present

## 2021-08-30 DIAGNOSIS — R7989 Other specified abnormal findings of blood chemistry: Secondary | ICD-10-CM | POA: Diagnosis not present

## 2021-08-30 DIAGNOSIS — H9193 Unspecified hearing loss, bilateral: Secondary | ICD-10-CM | POA: Diagnosis not present

## 2021-09-05 ENCOUNTER — Encounter: Payer: Self-pay | Admitting: Nurse Practitioner

## 2021-09-05 ENCOUNTER — Ambulatory Visit (INDEPENDENT_AMBULATORY_CARE_PROVIDER_SITE_OTHER): Payer: Medicare Other | Admitting: Nurse Practitioner

## 2021-09-05 VITALS — BP 111/73 | HR 73 | Ht 66.0 in | Wt 226.6 lb

## 2021-09-05 DIAGNOSIS — G4733 Obstructive sleep apnea (adult) (pediatric): Secondary | ICD-10-CM

## 2021-09-05 DIAGNOSIS — I493 Ventricular premature depolarization: Secondary | ICD-10-CM | POA: Diagnosis not present

## 2021-09-05 DIAGNOSIS — I6523 Occlusion and stenosis of bilateral carotid arteries: Secondary | ICD-10-CM

## 2021-09-05 DIAGNOSIS — E785 Hyperlipidemia, unspecified: Secondary | ICD-10-CM | POA: Diagnosis not present

## 2021-09-05 DIAGNOSIS — I495 Sick sinus syndrome: Secondary | ICD-10-CM

## 2021-09-05 DIAGNOSIS — I4819 Other persistent atrial fibrillation: Secondary | ICD-10-CM | POA: Diagnosis not present

## 2021-09-05 DIAGNOSIS — Z952 Presence of prosthetic heart valve: Secondary | ICD-10-CM

## 2021-09-05 DIAGNOSIS — I2510 Atherosclerotic heart disease of native coronary artery without angina pectoris: Secondary | ICD-10-CM | POA: Diagnosis not present

## 2021-09-05 DIAGNOSIS — R600 Localized edema: Secondary | ICD-10-CM

## 2021-09-05 DIAGNOSIS — I10 Essential (primary) hypertension: Secondary | ICD-10-CM | POA: Diagnosis not present

## 2021-09-05 DIAGNOSIS — I447 Left bundle-branch block, unspecified: Secondary | ICD-10-CM

## 2021-09-05 MED ORDER — FUROSEMIDE 20 MG PO TABS: ORAL_TABLET | ORAL | 2 refills | Status: DC

## 2021-09-05 NOTE — Progress Notes (Signed)
? ? ?Office Visit  ?  ?Patient Name: Lori Price ?Date of Encounter: 09/05/2021 ? ?Primary Care Provider:  Velna Hatchet, MD ?Primary Cardiologist:  Minus Breeding, MD ? ?Chief Complaint  ?  ?88 year old female with a history of CAD s/p CABG x4 in 2001, aortic valve stenosis s/p TAVR in 12/2020, persistent atrial fibrillation on Eliquis, carotid artery stenosis s/p R CEA in 2007, hypertension, hyperlipidemia, OSA not on CPAP, vertigo, asthma, and arthritis who presents for follow-up related to atrial fibrillation and lower extremity edema.  ? ?Past Medical History  ?  ?Past Medical History:  ?Diagnosis Date  ? Anemia   ? Arthritis   ? Asthma   ? Atrial fibrillation, persistent ([place])   ? BPV (benign positional vertigo)   ? Carotid artery disease ([place])   ? s/p R CEA (2007)  ? Carotid artery occlusion   ? Coronary artery disease   ? s/p MI 2001--(catheterizations in 2001 with left main 70% stenosis, LAD 70% followed by 90% stenosis, circumflex 99% stenosis, right coronary artery occluded.   ? History of kidney stones   ? Hyperlipidemia   ? Hypertension   ? Morbid obesity ([place])   ? Nephrolithiasis   ? Peripheral vascular disease ([place])   ? Pneumonia   ? S/P TAVR (transcatheter aortic valve replacement) 01/09/2021  ? s/p TAVR with a 26 mm Medtronic Evolut Pro+ via the left subclavian approach by Dr. Burt Knack and Dr. Cyndia Bent  ? Sleep apnea   ? CPAP   no  ? Vertigo   ? ?Past Surgical History:  ?Procedure Laterality Date  ? CARDIAC CATHETERIZATION    ? 2001  ? CARDIAC SURGERY    ? CAROTID ENDARTERECTOMY Right   ? cea  Dr. Amedeo Plenty  ? CARPAL TUNNEL RELEASE Right   ? CORONARY ARTERY BYPASS GRAFT    ? 2001 (LIMA to LAD, SVG to obtuse marginal, sequential  SVG to RV , marginal branch in the distal right coronary artery.  ? EYE SURGERY    ? bilateral cataracts  ? knee arthoscopy    ? left carpal tunnel    ? ORIF SHOULDER FRACTURE Right 08/30/2013  ? Procedure: HEMI ARTHROPLASTY;  Surgeon: [name], MD;  Location: [facility];   Service: Orthopedics;  Laterality: Right;  ? right carotid endarterectomy    ? RIGHT/LEFT HEART CATH AND CORONARY/GRAFT ANGIOGRAPHY N/A 12/01/2020  ? Procedure: RIGHT/LEFT HEART CATH AND CORONARY/GRAFT ANGIOGRAPHY;  Surgeon: Burnell Blanks, MD;  Location: [facility] [facility];  Service: Cardiovascular;  Laterality: N/A;  ? TEE WITHOUT CARDIOVERSION N/A 01/09/2021  ? Procedure: TRANSESOPHAGEAL ECHOCARDIOGRAM (TEE);  Surgeon: Sherren Mocha, MD;  Location: [facility];  Service: Open Heart Surgery;  Laterality: N/A;  ? TONSILLECTOMY    ? VASCULAR SURGERY    ? ? ?Allergies ? ?Allergies  ?Allergen Reactions  ? Codeine Hives, Itching and Other (See Comments)  ?  extreme agitation  ? Losartan Other (See Comments)  ?  "I can't walk, I can't do anything"   ? Oxycodone Hives and Itching  ? Penicillins Swelling and Rash  ?  Has patient had a PCN reaction causing immediate rash, facial/tongue/throat swelling, SOB or lightheadedness with hypotension: NO ?Has patient had a PCN reaction causing severe rash involving mucus membranes or skin necrosis: NO ?Has patient had a PCN reaction that required hospitalization: NO  ?Has patient had a PCN reaction occurring within the last 10 years: NO ?If all of the above answers are "NO", then may proceed  with Cephalosporin use. ? ?  ? Pentazocine Other (See Comments) and Nausea And Vomiting  ? Quinapril Cough  ? Sibutramine Palpitations  ? Sulfamethoxazole Hives and Nausea And Vomiting  ?  hives  ? Sulfonamide Derivatives Rash  ? Ace Inhibitors Cough  ? Ak-Mycin [Erythromycin] Other (See Comments)  ?  unknown  ? Drug Ingredient [Modified Tree Tyrosine Adsorbate] Other (See Comments)  ?  unknown  ? Eggs Or Egg-Derived Products Hives, Diarrhea and Other (See Comments)  ?  Pt stated "My esophagus swells, I get hives, diarrhea, and I pass out"  ? ? ?History of Present Illness  ?  ?88 year old female with the above past medical history including CAD s/p CABG x4 in 2001, aortic valve  stenosis s/p TAVR in 12/2020, persistent atrial fibrillation on Eliquis, carotid artery stenosis s/p R CEA in 2007, hypertension, hyperlipidemia, OSA not on CPAP, vertigo, asthma, and arthritis. ?  ?She has a history of CAD s/p CABG x 4 in 2001. She was hospitalized in July 2017 in the setting of TIA.  She has a history of OSA, and has repeatedly declined CPAP therapy.  Echo in August 2020 showed moderate to severe aortic stenosis. She was initially asymptomatic, however, she developed progressive exertional fatigue and dyspnea in April 2022.  Repeat echo in June 2022 shows an increase in mean gradient of the aortic valve from 37 mmHg on previous echo to 60 mmHg, severe thickening and calcification of the aortic valve with restricted mobility, severe aortic stenosis, normal EF, and severe concentric LVH, moderate RV systolic dysfunction. Cardiac catheterization in July 2022 showed severe three-vessel CAD s/p CABG x4, with 1/4 patent bypass grafts, chronic total occlusion of mid LAD, mid and distal LAD fills from patent LIMA graft, moderately severe proximal circumflex stenosis, with chronically occluded vein graft to circumflex system, chronic occlusion of proximal RCA, with chronically occluded vein graft to RV marginal branch and PDA, distal RCA branch filling from right to right and left-to-right collaterals.  Medical therapy was recommended for CAD.  She underwent successful TAVR in August 2022.   ?  ?Additionally, she has a history of persistent atrial fibrillation, rate controlled, on Eliquis. Outpatient cardiac monitor in September 2022 showed continuous atrial fibrillation, frequent ventricular ectopy, a 5 beat run of ventricular tachycardia, tachybradycardia rhythm (HR 30-170 bpm), beta-blocker was decreased at the time in the setting of bradycardia.  Echocardiogram post TAVR in September 2022 showed normally functioning TAVR, mean gradient 4 mmHg, small pericardial effusion.  Repeat echo in October 2022  showed resolution of pericardial effusion. Carotid dopplers in October 2022 showed 1 to 39% B ICA stenosis, followed by [facility]VS.  She was last seen in the office on 06/05/2021 and was stable overall from a cardiac standpoint.  Atenolol was decreased further in the setting of bradycardia.  Follow-up was recommended in 1 year.  ?  ?She was last seen in the office on 08/29/21 and reported a 2 to 3-week history of increased bilateral lower extremity edema, with more notable swelling in the right leg, redness, and pain to the RLE, particularly when walking. LE duplex was negative for DVT. Her Lasix was increased. Her worsening lower extremity edema was thought to be tachy-mediated in the setting of atrial fibrillation. She was referred to EP given tachy-brady pattern. She presents today for follow-up. Since her last visit she reports a significant improvement in her lower extremity edema, however, she now reports intermittent dizziness at rest and with activity. She denies palpitations, presyncope, syncope.  Her weight is down 7 pounds since last week's visit.  She denies dyspnea, PND, orthopnea, denies symptoms concerning for angina.  Other than her dizziness, she denies any additional concerns today. ? ?Home Medications  ?  ?Current Outpatient Medications  ?Medication Sig Dispense Refill  ? acetaminophen (TYLENOL) 500 MG tablet Take 1,000 mg by mouth every 6 (six) hours as needed for moderate pain or mild pain.    ? apixaban (ELIQUIS) 5 MG TABS tablet Take 1 tablet (5 mg total) by mouth 2 (two) times daily. 180 tablet 1  ? atenolol (TENORMIN) 25 MG tablet Take 1 tablet (25 mg total) by mouth daily. 90 tablet 3  ? atorvastatin (LIPITOR) 10 MG tablet Take 1 tablet (10 mg total) by mouth daily. 90 tablet 3  ? bisacodyl (DULCOLAX) 5 MG EC tablet Take 5-10 mg by mouth daily as needed for moderate constipation.    ? BREO ELLIPTA 100-25 MCG/INH AEPB Inhale 1 puff into the lungs daily.    ? chlorthalidone (HYGROTON) 25 MG tablet  Take 1 tablet (25 mg total) by mouth daily. 90 tablet 3  ? fexofenadine (ALLEGRA) 180 MG tablet Take 180 mg by mouth daily as needed for allergies.    ? furosemide (LASIX) 20 MG tablet Take 20 mg daily. Take an

## 2021-09-05 NOTE — Patient Instructions (Signed)
Medication Instructions:  ?Decrease Lasix 20 mg daily, take an additional Lasix 20 mg on days of swelling, increase weight of 3 lb over night or 5 lbs in 1 week.  ?*If you need a refill on your cardiac medications before your next appointment, please call your pharmacy* ? ? ?Lab Work: ?Your physician recommends that you complete labs today ?BMET ? ?If you have labs (blood work) drawn today and your tests are completely normal, you will receive your results only by: ?MyChart Message (if you have MyChart) OR ?A paper copy in the mail ?If you have any lab test that is abnormal or we need to change your treatment, we will call you to review the results. ? ? ?Testing/Procedures: ?None ? ? ? ?Follow-Up: ?At [facility], you and your health needs are our priority.  As part of our continuing mission to provide you with exceptional heart care, we have created designated Provider Care Teams.  These Care Teams include your primary Cardiologist (physician) and Advanced Practice Providers (APPs -  Physician Assistants and Nurse Practitioners) who all work together to provide you with the care you need, when you need it. ? ?We recommend signing up for the patient portal called "MyChart".  Sign up information is provided on this After Visit Summary.  MyChart is used to connect with patients for Virtual Visits (Telemedicine).  Patients are able to view lab/test results, encounter notes, upcoming appointments, etc.  Non-urgent messages can be sent to your provider as well.   ?To learn more about what you can do with MyChart, go to [email or web address].   ? ?Your next appointment:   ?1 month(s) ? ?The format for your next appointment:   ?In Person ? ?Provider:   ?Minus Breeding, MD  or Diona Browner, NP      ? ? ?Other Instructions ?Monitor Blood pressure. Report if  Systolic Blood Pressure (top number) is consistently less than 110  ? ?Important Information About Sugar ? ? ? ? ? ? ?

## 2021-09-06 ENCOUNTER — Telehealth: Payer: Self-pay

## 2021-09-06 LAB — BASIC METABOLIC PANEL
BUN/Creatinine Ratio: 36 — ABNORMAL HIGH (ref 12–28)
BUN: 40 mg/dL — ABNORMAL HIGH (ref 8–27)
CO2: 23 mmol/L (ref 20–29)
Calcium: 10.1 mg/dL (ref 8.7–10.3)
Chloride: 96 mmol/L (ref 96–106)
Creatinine, Ser: 1.12 mg/dL — ABNORMAL HIGH (ref 0.57–1.00)
Glucose: 97 mg/dL (ref 70–99)
Potassium: 3.9 mmol/L (ref 3.5–5.2)
Sodium: 142 mmol/L (ref 134–144)
eGFR: 48 mL/min/{1.73_m2} — ABNORMAL LOW (ref >59)

## 2021-09-06 NOTE — Telephone Encounter (Signed)
Spoke with pt. Pt was notified of lab results and will follow up as directed.  ?

## 2021-09-17 ENCOUNTER — Other Ambulatory Visit: Payer: Self-pay | Admitting: Physician Assistant

## 2021-09-17 DIAGNOSIS — Z952 Presence of prosthetic heart valve: Secondary | ICD-10-CM

## 2021-09-20 ENCOUNTER — Encounter: Payer: Self-pay | Admitting: Cardiology

## 2021-09-20 ENCOUNTER — Ambulatory Visit (INDEPENDENT_AMBULATORY_CARE_PROVIDER_SITE_OTHER): Payer: Medicare Other

## 2021-09-20 ENCOUNTER — Ambulatory Visit (INDEPENDENT_AMBULATORY_CARE_PROVIDER_SITE_OTHER): Payer: Medicare Other | Admitting: Cardiology

## 2021-09-20 VITALS — BP 108/56 | HR 85 | Ht 66.0 in | Wt 228.0 lb

## 2021-09-20 DIAGNOSIS — I493 Ventricular premature depolarization: Secondary | ICD-10-CM | POA: Diagnosis not present

## 2021-09-20 NOTE — Patient Instructions (Addendum)
Medication Instructions:  ?Your physician recommends that you continue on your current medications as directed. Please refer to the Current Medication list given to you today. ? ?*If you need a refill on your cardiac medications before your next appointment, please call your pharmacy* ? ? ?Lab Work: ?None ordered ? ? ?Testing/Procedures: ?                         ? ?ZIO XT- Long Term Monitor Instructions ? ?Your physician has requested you wear a ZIO patch monitor for 7 days.  ?This is a single patch monitor. [employer] supplies one patch monitor per enrollment. Additional ?stickers are not available. Please do not apply patch if you will be having a Nuclear Stress Test,  ?Echocardiogram, Cardiac CT, MRI, or Chest Xray during the period you would be wearing the  ?monitor. The patch cannot be worn during these tests. You cannot remove and re-apply the  ?ZIO XT patch monitor.  ?Your ZIO patch monitor will be mailed 3 day USPS to your address on file. It may take 3-5 days  ?to receive your monitor after you have been enrolled.  ?Once you have received your monitor, please review the enclosed instructions. Your monitor  ?has already been registered assigning a specific monitor serial # to you. ? ?Billing and Patient Assistance Program Information ? ?We have supplied [employer] with any of your insurance information on file for billing purposes. ?[employer] offers a sliding scale Patient Assistance Program for patients that do not have  ?insurance, or whose insurance does not completely cover the cost of the ZIO monitor.  ?You must apply for the Patient Assistance Program to qualify for this discounted rate.  ?To apply, please call [employer] at [phone], select option 4, select option 2, ask to apply for  ?Patient Assistance Program. Theodore Demark will ask your household income, and how many people  ?are in your household. They will quote your out-of-pocket cost based on that information.  ?[employer] will also be able to set up a  96-month interest-free payment plan if needed. ? ?Applying the monitor ?  ?Shave hair from upper left chest.  ?Hold abrader disc by orange tab. Rub abrader in 40 strokes over the upper left chest as  ?indicated in your monitor instructions.  ?Clean area with 4 enclosed alcohol pads. Let dry.  ?Apply patch as indicated in monitor instructions. Patch will be placed under collarbone on left  ?side of chest with arrow pointing upward.  ?Rub patch adhesive wings for 2 minutes. Remove white label marked "1". Remove the white  ?label marked "2". Rub patch adhesive wings for 2 additional minutes.  ?While looking in a mirror, press and release button in center of patch. A small green light will  ?flash 3-4 times. This will be your only indicator that the monitor has been turned on.  ?Do not shower for the first 24 hours. You may shower after the first 24 hours.  ?Press the button if you feel a symptom. You will hear a small click. Record Date, Time and  ?Symptom in the Patient Logbook.  ?When you are ready to remove the patch, follow instructions on the last 2 pages of Patient  ?Logbook. Stick patch monitor onto the last page of Patient Logbook.  ?Place Patient Logbook in the blue and white box. Use locking tab on box and tape box closed  ?securely. The blue and white box has prepaid postage on it. Please place it in the  mailbox as  ?soon as possible. Your physician should have your test results approximately 7 days after the  ?monitor has been mailed back to [facility].  ?Call [facility] at [phone] if you have questions regarding  ?your ZIO XT patch monitor. Call them immediately if you see an orange light blinking on your  ?monitor.  ?If your monitor falls off in less than 4 days, contact our Monitor department at [phone].  ?If your monitor becomes loose or falls off after 4 days call [employer] at [phone] for  ?suggestions on securing your monitor ? ? ? ?Follow-Up: ?At [facility],  you and your health needs are our priority.  As part of our continuing mission to provide you with exceptional heart care, we have created designated Provider Care Teams.  These Care Teams include your primary Cardiologist (physician) and Advanced Practice Providers (APPs -  Physician Assistants and Nurse Practitioners) who all work together to provide you with the care you need, when you need it. ? ?Your next appointment:   ?To be  determined ? ?The format for your next appointment:   ?In Person ? ?Provider:   ?Allegra Lai, MD  ? ? ?Thank you for choosing [facility]!! ? ? ?Trinidad Curet, RN ?[phone] ? ?Other Instructions ? ?Important Information About Sugar ? ? ? ? ? ? ? ? ?  ?

## 2021-09-20 NOTE — Progress Notes (Signed)
? ?Electrophysiology Office Note ? ? ?Date:  09/20/2021  ? ?ID:  Lori Price, DOB Apr 14, 1932, MRN [id number] ? ?PCP:  Velna Hatchet, MD  ?Cardiologist:  Hochrein ?Primary Electrophysiologist:  Myasia Sinatra Meredith Leeds, MD   ? ?Chief Complaint: AF ?  ?History of Present Illness: ?Lori Price is a 88 y.o. female who is being seen today for the evaluation of AF at the request of Monge, Helane Gunther, NP. Presenting today for electrophysiology evaluation. ? ?She has a history significant for persistent atrial fibrillation, carotid artery disease status post CEA in 2007, coronary artery disease, hypertension, hyperlipidemia, morbid obesity, aortic stenosis status post TAVR in 2022, OSA. ? ?She overall feels well.  She is able to do all of her daily activities.  She does not have much shortness of breath unless she overexerts herself.  She feels well at rest.  She does have a history of vertigo.  She has been having also episodes of dizziness that are different from her vertigo.  They last for a few seconds at a time.  She states that she feels them after she eats a meal at times, but otherwise has no specific triggers. ? ?Today, she denies symptoms of palpitations, chest pain, shortness of breath, orthopnea, PND, lower extremity edema, claudication, dizziness, presyncope, syncope, bleeding, or neurologic sequela. The patient is tolerating medications without difficulties.  ? ? ?Past Medical History:  ?Diagnosis Date  ? Anemia   ? Arthritis   ? Asthma   ? Atrial fibrillation, persistent ([place])   ? BPV (benign positional vertigo)   ? Carotid artery disease ([place])   ? s/p R CEA (2007)  ? Carotid artery occlusion   ? Coronary artery disease   ? s/p MI 2001--(catheterizations in 2001 with left main 70% stenosis, LAD 70% followed by 90% stenosis, circumflex 99% stenosis, right coronary artery occluded.   ? History of kidney stones   ? Hyperlipidemia   ? Hypertension   ? Morbid obesity ([place])   ? Nephrolithiasis   ? Peripheral vascular  disease ([place])   ? Pneumonia   ? S/P TAVR (transcatheter aortic valve replacement) 01/09/2021  ? s/p TAVR with a 26 mm Medtronic Evolut Pro+ via the left subclavian approach by Dr. Burt Knack and Dr. Cyndia Bent  ? Sleep apnea   ? CPAP   no  ? Vertigo   ? ?Past Surgical History:  ?Procedure Laterality Date  ? CARDIAC CATHETERIZATION    ? 2001  ? CARDIAC SURGERY    ? CAROTID ENDARTERECTOMY Right   ? cea  Dr. Amedeo Plenty  ? CARPAL TUNNEL RELEASE Right   ? CORONARY ARTERY BYPASS GRAFT    ? 2001 (LIMA to LAD, SVG to obtuse marginal, sequential  SVG to RV , marginal branch in the distal right coronary artery.  ? EYE SURGERY    ? bilateral cataracts  ? knee arthoscopy    ? left carpal tunnel    ? ORIF SHOULDER FRACTURE Right 08/30/2013  ? Procedure: HEMI ARTHROPLASTY;  Surgeon: [name], MD;  Location: [facility];  Service: Orthopedics;  Laterality: Right;  ? right carotid endarterectomy    ? RIGHT/LEFT HEART CATH AND CORONARY/GRAFT ANGIOGRAPHY N/A 12/01/2020  ? Procedure: RIGHT/LEFT HEART CATH AND CORONARY/GRAFT ANGIOGRAPHY;  Surgeon: Burnell Blanks, MD;  Location: [facility];  Service: Cardiovascular;  Laterality: N/A;  ? TEE WITHOUT CARDIOVERSION N/A 01/09/2021  ? Procedure: TRANSESOPHAGEAL ECHOCARDIOGRAM (TEE);  Surgeon: Sherren Mocha, MD;  Location: [facility];  Service: Open Heart  Surgery;  Laterality: N/A;  ? TONSILLECTOMY    ? VASCULAR SURGERY    ? ? ? ?Current Outpatient Medications  ?Medication Sig Dispense Refill  ? acetaminophen (TYLENOL) 500 MG tablet Take 1,000 mg by mouth every 6 (six) hours as needed for moderate pain or mild pain.    ? apixaban (ELIQUIS) 5 MG TABS tablet Take 1 tablet (5 mg total) by mouth 2 (two) times daily. 180 tablet 1  ? atenolol (TENORMIN) 25 MG tablet Take 1 tablet (25 mg total) by mouth daily. 90 tablet 3  ? atorvastatin (LIPITOR) 10 MG tablet Take 1 tablet (10 mg total) by mouth daily. 90 tablet 3  ? bisacodyl (DULCOLAX) 5 MG EC tablet Take 5-10 mg by mouth daily as  needed for moderate constipation.    ? BREO ELLIPTA 100-25 MCG/INH AEPB Inhale 1 puff into the lungs daily.    ? chlorthalidone (HYGROTON) 25 MG tablet Take 1 tablet (25 mg total) by mouth daily. 90 tablet 3  ? fexofenadine (ALLEGRA) 180 MG tablet Take 180 mg by mouth daily as needed for allergies.    ? furosemide (LASIX) 20 MG tablet Take 20 mg daily. Take an additional 20 mg on days of swelling, weight gain of 3 lb overnight or 5 lb in 1 week 60 tablet 2  ? Glycerin-Hypromellose-PEG 400 (DRY EYE RELIEF DROPS) 0.2-0.2-1 % SOLN Place 1 drop into both eyes daily as needed (Dry eye).    ? ipratropium-albuterol (DUONEB) 0.5-2.5 (3) MG/3ML SOLN as needed.    ? meclizine (ANTIVERT) 25 MG tablet Take 25 mg by mouth daily as needed for dizziness.    ? Multiple Vitamin (MULTIVITAMIN) capsule Take 1 capsule by mouth daily. Woman    ? nystatin ointment (MYCOSTATIN) Apply 1 application topically 2 (two) times daily. 30 g 3  ? potassium chloride (KLOR-CON M) 10 MEQ tablet Take 1 tablet by mouth daily. Take additional tablet when taking extra lasix    ? ?No current facility-administered medications for this visit.  ? ? ?Allergies:   Codeine, Losartan, Oxycodone, Penicillins, Pentazocine, Quinapril, Sibutramine, Sulfamethoxazole, Sulfonamide derivatives, Ace inhibitors, Ak-mycin [erythromycin], Drug ingredient [modified tree tyrosine adsorbate], and Eggs or egg-derived products  ? ?Social History:  The patient  reports that she has never smoked. She has never used smokeless tobacco. She reports that she does not currently use alcohol. She reports that she does not currently use drugs.  ? ?Family History:  The patient's family history includes Aneurysm in her father; COPD in her father; Cancer in her mother; Heart disease in her maternal grandfather and maternal grandmother; Kidney disease in her father; Ovarian cancer in her mother.  ? ? ?ROS:  Please see the history of present illness.   Otherwise, review of systems is positive  for none.   All other systems are reviewed and negative.  ? ? ?PHYSICAL EXAM: ?VS:  BP (!) 108/56   Pulse 85   Ht '5\' 6"'$  (1.676 m)   Wt 228 lb (103.4 kg)   SpO2 90%   BMI 36.80 kg/m?  , BMI Body mass index is 36.8 kg/m?. ?GEN: Well nourished, well developed, in no acute distress  ?HEENT: normal  ?Neck: no JVD, carotid bruits, or masses ?Cardiac: irregular; no murmurs, rubs, or gallops,no edema  ?Respiratory:  clear to auscultation bilaterally, normal work of breathing ?GI: soft, nontender, nondistended, + BS ?MS: no deformity or atrophy  ?Skin: warm and dry ?Neuro:  Strength and sensation are intact ?Psych: euthymic mood, full affect ? ?  EKG:  EKG is ordered today. ?Personal review of the ekg ordered shows atrial fibrillation, multifocal PVCs, rate 85 ? ?Recent Labs: ?09/29/2020: NT-Pro BNP 1,888 ?01/08/2021: ALT 24 ?01/10/2021: Hemoglobin 13.2; Magnesium 2.0; Platelets 175 ?09/05/2021: BUN 40; Creatinine, Ser 1.12; Potassium 3.9; Sodium 142  ? ? ?Lipid Panel  ?   ?Component Value Date/Time  ? CHOL 153 02/08/2016 0852  ? TRIG 213 (H) 02/08/2016 0352  ? HDL 39 (L) 02/08/2016 0852  ? CHOLHDL 3.9 02/08/2016 0852  ? CHOLHDL 7.8 12/14/2015 0542  ? VLDL UNABLE TO CALCULATE IF TRIGLYCERIDE OVER 400 mg/dL 12/14/2015 0542  ? [place] 71 02/08/2016 0852  ? LDLDIRECT 111.8 02/03/2014 0924  ? ? ? ?Wt Readings from Last 3 Encounters:  ?09/20/21 228 lb (103.4 kg)  ?09/05/21 226 lb 9.6 oz (102.8 kg)  ?08/29/21 233 lb 6.4 oz (105.9 kg)  ?  ? ? ?Other studies Reviewed: ?Additional studies/ records that were reviewed today include: TTE 03/13/21  ?Review of the above records today demonstrates:  ? 1. Left ventricular ejection fraction, by estimation, is 55 to 60%. The  ?left ventricle has normal function. The left ventricle has no regional  ?wall motion abnormalities. There is moderate left ventricular hypertrophy.  ?Left ventricular diastolic function  ? could not be evaluated.  ? 2. Right ventricular systolic function is normal. The  right ventricular  ?size is normal. There is normal pulmonary artery systolic pressure.  ? 3. The inferior vena cava is normal in size with greater than 50%  ?respiratory variability, suggesting right atrial pres

## 2021-09-20 NOTE — Progress Notes (Unsigned)
[id number] ZIO XT from office inventory applied to patient. ?

## 2021-10-05 ENCOUNTER — Ambulatory Visit (INDEPENDENT_AMBULATORY_CARE_PROVIDER_SITE_OTHER): Payer: Medicare Other | Admitting: Nurse Practitioner

## 2021-10-05 ENCOUNTER — Encounter: Payer: Self-pay | Admitting: Nurse Practitioner

## 2021-10-05 VITALS — BP 128/72 | HR 61 | Resp 20 | Ht 67.0 in | Wt 229.0 lb

## 2021-10-05 DIAGNOSIS — G4733 Obstructive sleep apnea (adult) (pediatric): Secondary | ICD-10-CM | POA: Diagnosis not present

## 2021-10-05 DIAGNOSIS — Z952 Presence of prosthetic heart valve: Secondary | ICD-10-CM

## 2021-10-05 DIAGNOSIS — I2510 Atherosclerotic heart disease of native coronary artery without angina pectoris: Secondary | ICD-10-CM

## 2021-10-05 DIAGNOSIS — I493 Ventricular premature depolarization: Secondary | ICD-10-CM

## 2021-10-05 DIAGNOSIS — I10 Essential (primary) hypertension: Secondary | ICD-10-CM | POA: Diagnosis not present

## 2021-10-05 DIAGNOSIS — I447 Left bundle-branch block, unspecified: Secondary | ICD-10-CM | POA: Diagnosis not present

## 2021-10-05 DIAGNOSIS — I4819 Other persistent atrial fibrillation: Secondary | ICD-10-CM | POA: Diagnosis not present

## 2021-10-05 DIAGNOSIS — I495 Sick sinus syndrome: Secondary | ICD-10-CM | POA: Diagnosis not present

## 2021-10-05 DIAGNOSIS — I6523 Occlusion and stenosis of bilateral carotid arteries: Secondary | ICD-10-CM

## 2021-10-05 DIAGNOSIS — R600 Localized edema: Secondary | ICD-10-CM | POA: Diagnosis not present

## 2021-10-05 DIAGNOSIS — E785 Hyperlipidemia, unspecified: Secondary | ICD-10-CM

## 2021-10-05 NOTE — Progress Notes (Signed)
Office Visit    Patient Name: Lori Price Date of Encounter: 10/05/2021  Primary Care Provider:  Velna Hatchet, MD Primary Cardiologist:  Minus Breeding, MD  Chief Complaint    88 year old female with a history of CAD s/p CABG x4 in 2001, aortic valve stenosis s/p TAVR in 12/2020, persistent atrial fibrillation on Eliquis, carotid artery stenosis s/p R CEA in 2007, hypertension, hyperlipidemia, OSA not on CPAP, vertigo, asthma, and arthritis who presents for follow-up related to atrial fibrillation and lower extremity edema.  Past Medical History    Past Medical History:  Diagnosis Date   Anemia    Arthritis    Asthma    Atrial fibrillation, persistent (HCC)    BPV (benign positional vertigo)    Carotid artery disease ([place])    s/p R CEA (2007)   Carotid artery occlusion    Coronary artery disease    s/p MI 2001--(catheterizations in 2001 with left main 70% stenosis, LAD 70% followed by 90% stenosis, circumflex 99% stenosis, right coronary artery occluded.    History of kidney stones    Hyperlipidemia    Hypertension    Morbid obesity ([place])    Nephrolithiasis    Peripheral vascular disease ([place])    Pneumonia    S/P TAVR (transcatheter aortic valve replacement) 01/09/2021   s/p TAVR with a 26 mm Medtronic Evolut Pro+ via the left subclavian approach by Dr. Burt Knack and Dr. Cyndia Bent   Sleep apnea    CPAP   no   Vertigo    Past Surgical History:  Procedure Laterality Date   CARDIAC CATHETERIZATION     2001   CARDIAC SURGERY     CAROTID ENDARTERECTOMY Right    cea  Dr. Amedeo Plenty   CARPAL TUNNEL RELEASE Right    CORONARY ARTERY BYPASS GRAFT     2001 (LIMA to LAD, SVG to obtuse marginal, sequential  SVG to RV , marginal branch in the distal right coronary artery.   EYE SURGERY     bilateral cataracts   knee arthoscopy     left carpal tunnel     ORIF SHOULDER FRACTURE Right 08/30/2013   Procedure: HEMI ARTHROPLASTY;  Surgeon: [name], MD;  Location: [facility];   Service: Orthopedics;  Laterality: Right;   right carotid endarterectomy     RIGHT/LEFT HEART CATH AND CORONARY/GRAFT ANGIOGRAPHY N/A 12/01/2020   Procedure: RIGHT/LEFT HEART CATH AND CORONARY/GRAFT ANGIOGRAPHY;  Surgeon: Burnell Blanks, MD;  Location: [facility];  Service: Cardiovascular;  Laterality: N/A;   TEE WITHOUT CARDIOVERSION N/A 01/09/2021   Procedure: TRANSESOPHAGEAL ECHOCARDIOGRAM (TEE);  Surgeon: Sherren Mocha, MD;  Location: [facility];  Service: Open Heart Surgery;  Laterality: N/A;   TONSILLECTOMY     VASCULAR SURGERY      Allergies  Allergies  Allergen Reactions   Codeine Hives, Itching and Other (See Comments)    extreme agitation   Losartan Other (See Comments)    "I can't walk, I can't do anything"    Oxycodone Hives and Itching   Penicillins Swelling and Rash    Has patient had a PCN reaction causing immediate rash, facial/tongue/throat swelling, SOB or lightheadedness with hypotension: NO Has patient had a PCN reaction causing severe rash involving mucus membranes or skin necrosis: NO Has patient had a PCN reaction that required hospitalization: NO  Has patient had a PCN reaction occurring within the last 10 years: NO If all of the above answers are "NO", then may proceed with  Cephalosporin use.     Pentazocine Other (See Comments) and Nausea And Vomiting   Quinapril Cough   Sibutramine Palpitations   Sulfamethoxazole Hives and Nausea And Vomiting    hives   Sulfonamide Derivatives Rash   Ace Inhibitors Cough   Ak-Mycin [Erythromycin] Other (See Comments)    unknown   Drug Ingredient [Modified Tree Tyrosine Adsorbate] Other (See Comments)    unknown   Eggs Or Egg-Derived Products Hives, Diarrhea and Other (See Comments)    Pt stated "My esophagus swells, I get hives, diarrhea, and I pass out"    History of Present Illness    88 year old female with the above past medical history including CAD s/p CABG x4 in 2001, aortic valve  stenosis s/p TAVR in 12/2020, persistent atrial fibrillation on Eliquis, carotid artery stenosis s/p R CEA in 2007, hypertension, hyperlipidemia, OSA not on CPAP, vertigo, asthma, and arthritis.   She has a history of CAD s/p CABG x 4 in 2001. She was hospitalized in July 2017 in the setting of TIA.  She has a history of OSA, and has repeatedly declined CPAP therapy. Echo in August 2020 showed moderate to severe aortic stenosis. She was initially asymptomatic, however, she developed progressive exertional fatigue and dyspnea in April 2022.  Repeat echo in June 2022 shows an increase in mean gradient of the aortic valve from 37 mmHg on previous echo to 60 mmHg, severe thickening and calcification of the aortic valve with restricted mobility, severe aortic stenosis, normal EF, and severe concentric LVH, moderate RV systolic dysfunction. Cardiac catheterization in July 2022 showed severe three-vessel CAD s/p CABG x4, with 1/4 patent bypass grafts, chronic total occlusion of mid LAD, mid and distal LAD fills from patent LIMA graft, moderately severe proximal circumflex stenosis, with chronically occluded vein graft to circumflex system, chronic occlusion of proximal RCA, with chronically occluded vein graft to RV marginal branch and PDA, distal RCA branch filling from right to right and left-to-right collaterals. Medical therapy was recommended for CAD.  She underwent successful TAVR in August 2022.     Additionally, she has a history of persistent atrial fibrillation, rate controlled, on Eliquis. Outpatient cardiac monitor in September 2022 showed continuous atrial fibrillation, frequent ventricular ectopy, a 5 beat run of ventricular tachycardia, tachybradycardia rhythm (HR 30-170 bpm), beta-blocker was decreased at the time in the setting of bradycardia. Echocardiogram post TAVR in September 2022 showed normally functioning TAVR, mean gradient 4 mmHg, small pericardial effusion.  Repeat echo in October 2022 showed  resolution of pericardial effusion. Carotid dopplers in October 2022 showed 1 to 39% B ICA stenosis, followed by [facility].    She was seen in the office in April 2023 and reported increased bilateral lower extremity edema, R>L, LE duplex was negative for DVT. Lasix was increased, she was referred to EP given tachybradycardia pattern with permanent atrial fibrillation.  She was last seen in the office on 09/20/2021 by Dr. Curt Bears.  Rate control strategy was recommended for atrial fibrillation, ZIO monitor was applied in the setting of frequent PVCs. Follow-up with EP was recommended pending monitor results. She presents today for follow-up.  Since her last visit she has been stable from a cardiac standpoint.  She has only had to take an additional 20 mg of Lasix once since her last visit.  Her edema has improved significantly, weight has been stable.  She denies any recurrent dizziness, denies palpitations. Overall, she reports feeling well and denies any new symptoms or concerns today.  Home Medications  Current Outpatient Medications  Medication Sig Dispense Refill   acetaminophen (TYLENOL) 500 MG tablet Take 1,000 mg by mouth every 6 (six) hours as needed for moderate pain or mild pain.     apixaban (ELIQUIS) 5 MG TABS tablet Take 1 tablet (5 mg total) by mouth 2 (two) times daily. 180 tablet 1   atenolol (TENORMIN) 25 MG tablet Take 1 tablet (25 mg total) by mouth daily. 90 tablet 3   atorvastatin (LIPITOR) 10 MG tablet Take 1 tablet (10 mg total) by mouth daily. 90 tablet 3   bisacodyl (DULCOLAX) 5 MG EC tablet Take 5-10 mg by mouth daily as needed for moderate constipation.     BREO ELLIPTA 100-25 MCG/INH AEPB Inhale 1 puff into the lungs daily.     chlorthalidone (HYGROTON) 25 MG tablet Take 1 tablet (25 mg total) by mouth daily. 90 tablet 3   fexofenadine (ALLEGRA) 180 MG tablet Take 180 mg by mouth daily as needed for allergies.     furosemide (LASIX) 20 MG tablet Take 20 mg daily. Take an  additional 20 mg on days of swelling, weight gain of 3 lb overnight or 5 lb in 1 week 60 tablet 2   Glycerin-Hypromellose-PEG 400 (DRY EYE RELIEF DROPS) 0.2-0.2-1 % SOLN Place 1 drop into both eyes daily as needed (Dry eye).     ipratropium-albuterol (DUONEB) 0.5-2.5 (3) MG/3ML SOLN as needed.     meclizine (ANTIVERT) 25 MG tablet Take 25 mg by mouth daily as needed for dizziness.     Multiple Vitamin (MULTIVITAMIN) capsule Take 1 capsule by mouth daily. Woman     nystatin ointment (MYCOSTATIN) Apply 1 application topically 2 (two) times daily. 30 g 3   potassium chloride (KLOR-CON M) 10 MEQ tablet Take 1 tablet by mouth daily. Take additional tablet when taking extra lasix     No current facility-administered medications for this visit.     Review of Systems    She denies chest pain, palpitations, dyspnea, pnd, orthopnea, n, v, dizziness, syncope, weight gain, or early satiety. All other systems reviewed and are otherwise negative except as noted above.   Physical Exam    VS:  BP 128/72 (BP Location: Left Arm, Patient Position: Sitting, Cuff Size: Large)   Pulse 61   Resp 20   Ht '5\' 7"'$  (1.702 m)   Wt 229 lb (103.9 kg)   SpO2 95%   BMI 35.87 kg/m   GEN: Well nourished, well developed, in no acute distress. HEENT: normal. Neck: Supple, no JVD, carotid bruits, or masses. Cardiac: IRIR, 2/6 systolic murmur, no rubs, or gallops. No clubbing, cyanosis, nonpitting bilateral lower extremity edema.  Radials/DP/PT 2+ and equal bilaterally.  Respiratory:  Respirations regular and unlabored, clear to auscultation bilaterally. GI: Soft, nontender, nondistended, BS + x 4. MS: no deformity or atrophy. Skin: warm and dry, no rash. Neuro:  Strength and sensation are intact. Psych: Normal affect.  Accessory Clinical Findings    ECG personally reviewed by me today - No EKG in office today.  Lab Results  Component Value Date   WBC 11.6 (H) 01/10/2021   HGB 13.2 01/10/2021   HCT 41.8  01/10/2021   MCV 92.3 01/10/2021   PLT 175 01/10/2021   Lab Results  Component Value Date   CREATININE 1.12 (H) 09/05/2021   BUN 40 (H) 09/05/2021   NA 142 09/05/2021   K 3.9 09/05/2021   CL 96 09/05/2021   CO2 23 09/05/2021   Lab Results  Component Value  Date   ALT 24 01/08/2021   AST 25 01/08/2021   ALKPHOS 70 01/08/2021   BILITOT 1.2 01/08/2021   Lab Results  Component Value Date   CHOL 153 02/08/2016   HDL 39 (L) 02/08/2016   LDLCALC 71 02/08/2016   LDLDIRECT 111.8 02/03/2014   TRIG 213 (H) 02/08/2016   CHOLHDL 3.9 02/08/2016    Lab Results  Component Value Date   HGBA1C 6.0 (H) 12/14/2015    Assessment & Plan    1. Bilateral lower extremity edema, R>L:  RLE was negative for DVT. Echo in October 2022 showed EF 55 to 60%, moderate LVH.  Edema significantly improved with lasix. Continue Lasix 20 mg daily with an additional 20 mg daily as needed for swelling, weight gain.   2. Longstanding persistent atrial fibrillation/tachy-brady syndrome/PVCs/LBBB: Outpatient monitor in September 2022 showed continuous atrial fibrillation, tachybradycardia rhythm, rates varied from 30s to 170s, frequent ventricular ectopy, with 15 beat run of ventricular tachycardia).  She is on atenolol 25 mg daily.  ZIO monitor pending per EP who recommends ongoing rate control strategy.  She denies palpitations.  Denies bleeding on Eliquis. Continue atenolol, Eliquis.    3. Dizziness: Carotid Dopplers in October 2022 showed 1- 39% B ICA stenosis.  Lasix was decreased at last visit, dizziness resolved. Dizziness likely occurred in the setting of rapid diuresis, hypotension. BP stable. She is tolerating Lasix at current dose.  Zio results pending as above.   4. Severe aortic stenosis s/p TAVR: Echocardiogram post TAVR in September 2022 showed normally functioning TAVR, mean gradient 4 mmHg.  She does have mild non-pitting bilateral lower extremity edema. Denies dyspnea, weight gain.  Continue Lasix.  Continue SBE prophylaxis. She is pending repeat echo and f/u with structural heart team in July 2023.    4. CAD: Stable with no anginal symptoms. No indication for ischemic evaluation.  Not on ASA given chronic DOAC therapy, continue chlorthalidone, atenolol, Lasix, and Lipitor.  5. Hypertension: BP well controlled. Continue current antihypertensive regimen.   6. Hyperlipidemia: LDL was 41 in 06/2020, monitored and managed by PCP.  Continue Lipitor.   7. OSA: Declines CPAP.    8. Bilateral carotid artery stenosis: S/p R CEA in 2007. Carotid dopplers in October 2022 showed 1 to 39% B ICA stenosis, followed by [facility].     9. Disposition: F/u in 4-5 months.    Lenna Sciara, NP 10/05/2021, 11:42 AM

## 2021-10-05 NOTE — Patient Instructions (Signed)
Medication Instructions:  Your physician recommends that you continue on your current medications as directed. Please refer to the Current Medication list given to you today.   *If you need a refill on your cardiac medications before your next appointment, please call your pharmacy*   Lab Work: NONE ordered at this time of appointment   If you have labs (blood work) drawn today and your tests are completely normal, you will receive your results only by: [place] (if you have MyChart) OR A paper copy in the mail If you have any lab test that is abnormal or we need to change your treatment, we will call you to review the results.   Testing/Procedures: NONE ordered at this time of appointment     Follow-Up: At [facility], you and your health needs are our priority.  As part of our continuing mission to provide you with exceptional heart care, we have created designated Provider Care Teams.  These Care Teams include your primary Cardiologist (physician) and Advanced Practice Providers (APPs -  Physician Assistants and Nurse Practitioners) who all work together to provide you with the care you need, when you need it.  We recommend signing up for the patient portal called "MyChart".  Sign up information is provided on this After Visit Summary.  MyChart is used to connect with patients for Virtual Visits (Telemedicine).  Patients are able to view lab/test results, encounter notes, upcoming appointments, etc.  Non-urgent messages can be sent to your provider as well.   To learn more about what you can do with MyChart, go to [email or web address].    Your next appointment:   4-5 month(s)  The format for your next appointment:   In Person  Provider:   Minus Breeding, MD  or Diona Browner, NP        Other Instructions   Important Information About Sugar

## 2021-10-31 ENCOUNTER — Other Ambulatory Visit: Payer: Self-pay | Admitting: Cardiology

## 2021-11-03 DIAGNOSIS — I493 Ventricular premature depolarization: Secondary | ICD-10-CM | POA: Diagnosis not present

## 2021-11-05 DIAGNOSIS — H903 Sensorineural hearing loss, bilateral: Secondary | ICD-10-CM | POA: Diagnosis not present

## 2021-11-15 DIAGNOSIS — I493 Ventricular premature depolarization: Secondary | ICD-10-CM | POA: Diagnosis not present

## 2021-12-04 DIAGNOSIS — M25572 Pain in left ankle and joints of left foot: Secondary | ICD-10-CM | POA: Diagnosis not present

## 2021-12-04 DIAGNOSIS — L304 Erythema intertrigo: Secondary | ICD-10-CM | POA: Diagnosis not present

## 2021-12-07 ENCOUNTER — Ambulatory Visit (HOSPITAL_COMMUNITY): Payer: Medicare Other | Attending: Cardiology

## 2021-12-07 ENCOUNTER — Ambulatory Visit (INDEPENDENT_AMBULATORY_CARE_PROVIDER_SITE_OTHER): Payer: Medicare Other | Admitting: Physician Assistant

## 2021-12-07 VITALS — BP 122/90 | HR 70 | Ht 67.0 in | Wt 225.8 lb

## 2021-12-07 DIAGNOSIS — E785 Hyperlipidemia, unspecified: Secondary | ICD-10-CM | POA: Diagnosis not present

## 2021-12-07 DIAGNOSIS — Z952 Presence of prosthetic heart valve: Secondary | ICD-10-CM | POA: Diagnosis not present

## 2021-12-07 DIAGNOSIS — I10 Essential (primary) hypertension: Secondary | ICD-10-CM | POA: Diagnosis not present

## 2021-12-07 DIAGNOSIS — I4819 Other persistent atrial fibrillation: Secondary | ICD-10-CM | POA: Diagnosis not present

## 2021-12-07 DIAGNOSIS — I6523 Occlusion and stenosis of bilateral carotid arteries: Secondary | ICD-10-CM | POA: Diagnosis not present

## 2021-12-07 LAB — ECHOCARDIOGRAM COMPLETE
AR max vel: 1.86 cm2
AV Area VTI: 1.78 cm2
AV Area mean vel: 1.83 cm2
AV Mean grad: 11 mmHg
AV Peak grad: 15.9 mmHg
Ao pk vel: 2 m/s
Area-P 1/2: 1.93 cm2
S' Lateral: 2.4 cm

## 2021-12-07 NOTE — Patient Instructions (Signed)

## 2021-12-07 NOTE — Progress Notes (Unsigned)
HEART AND [place]                                     Cardiology Office Note:    Date:  12/10/2021   ID:  Lori Price, DOB 1932-04-30, MRN [id number]  PCP:  Velna Hatchet, MD  [facility] Cardiologist:  Minus Breeding, MD / Dr. Burt Knack & Dr. Cyndia Bent (TAVR) [facility] Electrophysiologist:  None   Referring MD: Velna Hatchet, MD   1 year s/p TAVR  History of Present Illness:    Lori Price is a 88 y.o. female with a hx of hypertension, hyperlipidemia carotid artery disease status post right carotid endarterectomy in 2007, peripheral vascular disease, coronary artery disease status post coronary bypass graft surgery by Dr. Darcey Nora in 2001 after myocardial infarction, untreated sleep apnea, and severe aortic stenosis s/p TAVR (01/09/21) who presents to clinic for follow up.   She had an echocardiogram in August 2021 showing moderate to severe aortic stenosis with a mean gradient of 37 mmHg and a valve area by VTI of 0.8 cm.  There is trivial aortic insufficiency.  Left ventricular systolic function was normal with severe LVH.  She was asymptomatic at that time and states she was feeling fine until April 2022 when she began developing progressive exertional fatigue and dyspnea as well as chest pressure.  She has had occasional dizziness but no syncope.  She has had peripheral edema. Repeat echo on 11/09/2020 showed an increase in the mean gradient across aortic valve to 60 mmHg with a peak gradient of 109 mmHg.  Aortic valve area was 0.48 cm.  There was severe thickening and calcification of the aortic valve with restricted mobility.  Left ventricular ejection fraction was normal with severe concentric LVH.  There was moderate right ventricular systolic dysfunction. L/RHC on 12/01/20 showed severe three vessel CAD s/p 4V CABG with 1/4 patent bypass grafts. There was chronic total occlusion of the mid LAD. The mid and distal LAD fills from the  patent LIMA graft. Moderately severe proximal Circumflex stenosis. The vein graft to the Circumflex system is chronically occluded. Chronic occlusion of the proximal RCA. The sequential vein graft to the RV marginal branch and PDA is chronically occluded. The distal RCA and branches fill from right to right and left to right collaterals. There was severe aortic stenosis (mean gradient 49.4 mmHg, Peak to peak gradient 58 mmHg, AVA 0.74 cm2). Medical therapy was recommended for her CAD.    She was evaluated by the multidisciplinary valve team and underwent successful TAVR with a 26 mm  Medtronic Evolut Pro + THV via the left subclavian approach on 01/09/21. Post operative echo showed EF 50%, normally functioning TAVR with a mean gradient of 7.5 mmHg and trivial PVL. She was resumed on home Eliquis alone without concurrent antiplatelet therapy given advance age and high fall risk. She had a new LBBB and slow HRs so atenolol was reduced from '100mg'$  to '50mg'$  daily and she was discharged home with a Zio AT. She was also noted to have hypoxia and discharged home with home 02. She has done well since TAVR with an improvement in energy. 1 month echo showed EF 50%, normally functioning TAVR with a mean gradient of 4 mm hg mmHg and trivial PVL. There was mild RV dysfunction and a small pericardial effusion.  She was seen in the office in  April 2023 and reported increased bilateral lower extremity edema, R>L, LE duplex was negative for DVT. Lasix was increased, she was referred to EP given tachybradycardia pattern with permanent atrial fibrillation.  She was last seen in the office on 09/20/2021 by Dr. Curt Bears.  Rate control strategy was recommended for atrial fibrillation, ZIO monitor was applied in the setting of frequent PVCs.  Today the patient presents to clinic for follow up. She is here with her husband. No CP or SOB. No LE edema, orthopnea or PND. No dizziness or syncope. No blood in stool or urine. No palpitations.    Past Medical History:  Diagnosis Date   Anemia    Arthritis    Asthma    Atrial fibrillation, persistent (HCC)    BPV (benign positional vertigo)    Carotid artery disease ([place])    s/p R CEA (2007)   Carotid artery occlusion    Coronary artery disease    s/p MI 2001--(catheterizations in 2001 with left main 70% stenosis, LAD 70% followed by 90% stenosis, circumflex 99% stenosis, right coronary artery occluded.    History of kidney stones    Hyperlipidemia    Hypertension    Morbid obesity ([place])    Nephrolithiasis    Peripheral vascular disease ([place])    Pneumonia    S/P TAVR (transcatheter aortic valve replacement) 01/09/2021   s/p TAVR with a 26 mm Medtronic Evolut Pro+ via the left subclavian approach by Dr. Burt Knack and Dr. Cyndia Bent   Sleep apnea    CPAP   no   Vertigo     Past Surgical History:  Procedure Laterality Date   CARDIAC CATHETERIZATION     2001   CARDIAC SURGERY     CAROTID ENDARTERECTOMY Right    cea  Dr. Amedeo Plenty   CARPAL TUNNEL RELEASE Right    CORONARY ARTERY BYPASS GRAFT     2001 (LIMA to LAD, SVG to obtuse marginal, sequential  SVG to RV , marginal branch in the distal right coronary artery.   EYE SURGERY     bilateral cataracts   knee arthoscopy     left carpal tunnel     ORIF SHOULDER FRACTURE Right 08/30/2013   Procedure: HEMI ARTHROPLASTY;  Surgeon: [name], MD;  Location: [place];  Service: Orthopedics;  Laterality: Right;   right carotid endarterectomy     RIGHT/LEFT HEART CATH AND CORONARY/GRAFT ANGIOGRAPHY N/A 12/01/2020   Procedure: RIGHT/LEFT HEART CATH AND CORONARY/GRAFT ANGIOGRAPHY;  Surgeon: Burnell Blanks, MD;  Location: [place] [facility];  Service: Cardiovascular;  Laterality: N/A;   TEE WITHOUT CARDIOVERSION N/A 01/09/2021   Procedure: TRANSESOPHAGEAL ECHOCARDIOGRAM (TEE);  Surgeon: Sherren Mocha, MD;  Location: [place] [facility];  Service: Open Heart Surgery;  Laterality: N/A;   TONSILLECTOMY     VASCULAR SURGERY       Current Medications: Current Meds  Medication Sig   acetaminophen (TYLENOL) 500 MG tablet Take 1,000 mg by mouth every 6 (six) hours as needed for moderate pain or mild pain.   apixaban (ELIQUIS) 5 MG TABS tablet Take 1 tablet (5 mg total) by mouth 2 (two) times daily.   atenolol (TENORMIN) 25 MG tablet Take 1 tablet (25 mg total) by mouth daily.   atorvastatin (LIPITOR) 10 MG tablet TAKE ONE TABLET BY MOUTH DAILY   bisacodyl (DULCOLAX) 5 MG EC tablet Take 5-10 mg by mouth daily as needed for moderate constipation.   BREO ELLIPTA 100-25 MCG/INH AEPB Inhale 1 puff into the lungs daily.  chlorthalidone (HYGROTON) 25 MG tablet Take 1 tablet (25 mg total) by mouth daily.   fexofenadine (ALLEGRA) 180 MG tablet Take 180 mg by mouth daily as needed for allergies.   furosemide (LASIX) 20 MG tablet Take 20 mg daily. Take an additional 20 mg on days of swelling, weight gain of 3 lb overnight or 5 lb in 1 week   Glycerin-Hypromellose-PEG 400 (DRY EYE RELIEF DROPS) 0.2-0.2-1 % SOLN Place 1 drop into both eyes daily as needed (Dry eye).   ipratropium-albuterol (DUONEB) 0.5-2.5 (3) MG/3ML SOLN as needed.   meclizine (ANTIVERT) 25 MG tablet Take 25 mg by mouth daily as needed for dizziness.   Multiple Vitamin (MULTIVITAMIN) capsule Take 1 capsule by mouth daily. Woman   nystatin ointment (MYCOSTATIN) Apply 1 application topically 2 (two) times daily.   potassium chloride (KLOR-CON M) 10 MEQ tablet Take 1 tablet by mouth daily. Take additional tablet when taking extra lasix     Allergies:   Codeine, Losartan, Oxycodone, Penicillins, Pentazocine, Quinapril, Sibutramine, Sulfamethoxazole, Sulfonamide derivatives, Ace inhibitors, Ak-mycin [erythromycin], Drug ingredient [modified tree tyrosine adsorbate], and Eggs or egg-derived products   Social History   Socioeconomic History   Marital status: Married    Spouse name: Not on file   Number of children: Not on file   Years of education: Not on file    Highest education level: Not on file  Occupational History   Not on file  Tobacco Use   Smoking status: Never   Smokeless tobacco: Never  Vaping Use   Vaping Use: Never used  Substance and Sexual Activity   Alcohol use: Not Currently   Drug use: Not Currently   Sexual activity: Not on file  Other Topics Concern   Not on file  Social History Narrative   ** Merged History Encounter **       Social Determinants of Health   Financial Resource Strain: Not on file  Food Insecurity: Not on file  Transportation Needs: Not on file  Physical Activity: Not on file  Stress: Not on file  Social Connections: Not on file     Family History: The patient's family history includes Aneurysm in her father; COPD in her father; Cancer in her mother; Heart disease in her maternal grandfather and maternal grandmother; Kidney disease in her father; Ovarian cancer in her mother.  ROS:   Please see the history of present illness.    All other systems reviewed and are negative.  EKGs/Labs/Other Studies Reviewed:    The following studies were reviewed today:  TAVR OPERATIVE NOTE   Date of Procedure:                01/09/2021   Preoperative Diagnosis:      Severe Aortic Stenosis    Postoperative Diagnosis:    Same    Procedure:        Transcatheter Aortic Valve Replacement - Left Subclavian Artery Approach             Medtronic  Evolut Pro +(size 26 mm,  serial # [id number])              Co-Surgeons:                        Gaye Pollack, MD and Sherren Mocha, MD     Anesthesiologist:                  Therisa Doyne, MD   Echocardiographer:  Edmonia James, MD   Pre-operative Echo Findings: Severe aortic stenosis Normal left ventricular systolic function   Post-operative Echo Findings: Mild paravalvular leak Normal left ventricular systolic function   _____________     Echo 01/10/21:  IMPRESSIONS  1. Left ventricular ejection fraction, by estimation, is 50 to 55%. The   left ventricle has low normal function. The left ventricle has no regional  wall motion abnormalities. The left ventricular internal cavity size was  mildly dilated. Left ventricular  diastolic function could not be evaluated.   2. Right ventricular systolic function is normal. The right ventricular  size is normal. There is normal pulmonary artery systolic pressure.   3. The mitral valve is grossly normal. Mild mitral valve regurgitation.   4. There is a trivial PVL present. The aortic valve has been  repaired/replaced. Aortic valve regurgitation is trivial.  ___________________________  Echo 02/08/21 IMPRESSIONS  1. Left ventricular ejection fraction, by estimation, is 50 to 55%. The left ventricle has low normal function. The left ventricle has no regional wall motion abnormalities. There is moderate concentric left ventricular hypertrophy. Left ventricular  diastolic function could not be evaluated.  2. Right ventricular systolic function is moderately reduced. The right ventricular size is moderately enlarged. There is moderately elevated pulmonary artery systolic pressure.  3. Left atrial size was severely dilated.  4. Right atrial size was severely dilated.  5. A small pericardial effusion is present. The pericardial effusion is circumferential.  6. The mitral valve is abnormal. Trivial mitral valve regurgitation. No evidence of mitral stenosis. Moderate mitral annular calcification.  7. The aortic valve has been repaired/replaced. Aortic valve regurgitation is trivial. There is a 26 mm stented (TAVR) valve present in the aortic position. Procedure Date: 01/09/21. Aortic valve area, by VTI measures 2.13 cm. Aortic valve mean gradient  measures 3.6 mmHg. Aortic valve Vmax measures 1.38 m/s.  8. The inferior vena cava is dilated in size with >50% respiratory variability, suggesting right atrial pressure of 8 mmHg.   Comparison(s): Prior images reviewed side by side.    Conclusion(s)/Recommendation(s): S/P TAVR. Valve appears well seated, with trivial paravalvular leak. PVCs noted throughout study. Small circumferential pericardial effusion without evidence of tamponade. Elevated right sided pressures. Bradycardia  noted, HR 30-60 in atrial fibrillation. Findings communicated with Dr. Burt Knack.  ____________________________  Echo 12/07/21 IMPRESSIONS  1. Left ventricular ejection fraction, by estimation, is 55 to 60%. The  left ventricle has normal function. The left ventricle has no regional  wall motion abnormalities. Left ventricular diastolic parameters are  indeterminate. Elevated left atrial  pressure.   2. Right ventricular systolic function is normal. The right ventricular  size is normal. There is mildly elevated pulmonary artery systolic  pressure.   3. Left atrial size was severely dilated.   4. Right atrial size was severely dilated.   5. The mitral valve is degenerative. Mild mitral valve regurgitation. No  evidence of mitral stenosis. Moderate mitral annular calcification.   6. The aortic valve has been replaced by a 26 mm Evolute Valve. Aortic  valve regurgitation is trivial and paravalvular in nature. Effective  orifice area, by VTI measures 1.78 cm. Mean gradient 11 mm Hg. DVI 0.36.   7. Echo findings are consistent with normal structure and function of the  aortic valve prosthesis.   8. The inferior vena cava is dilated in size with <50% respiratory  variability, suggesting right atrial pressure of 15 mmHg.   Comparison(s): Similar aortic valve gradients and bradycardia. Technically  difficult  study.   EKG:  EKG is NOT ordered today  Recent Labs: 01/08/2021: ALT 24 01/10/2021: Hemoglobin 13.2; Magnesium 2.0; Platelets 175 09/05/2021: BUN 40; Creatinine, Ser 1.12; Potassium 3.9; Sodium 142  Recent Lipid Panel    Component Value Date/Time   CHOL 153 02/08/2016 0852   TRIG 213 (H) 02/08/2016 0852   HDL 39 (L) 02/08/2016 0852    CHOLHDL 3.9 02/08/2016 0852   CHOLHDL 7.8 12/14/2015 0542   VLDL UNABLE TO CALCULATE IF TRIGLYCERIDE OVER 400 mg/dL 12/14/2015 0542   LDLCALC 71 02/08/2016 0852   LDLDIRECT 111.8 02/03/2014 0924     Risk Assessment/Calculations:    CHA2DS2-VASc Score = 7  This indicates a 11.2% annual risk of stroke. The patient's score is based upon: CHF History: 1 HTN History: 1 Diabetes History: 1 Stroke History: 0 Vascular Disease History: 1 Age Score: 2 Gender Score: 1      Physical Exam:    VS:  BP 122/90   Pulse 70   Ht '5\' 7"'$  (1.702 m)   Wt 225 lb 12.8 oz (102.4 kg)   SpO2 92%   BMI 35.37 kg/m     Wt Readings from Last 3 Encounters:  12/07/21 225 lb 12.8 oz (102.4 kg)  10/05/21 229 lb (103.9 kg)  09/20/21 228 lb (103.4 kg)     GEN:  Well nourished, well developed in no acute distress, obese HEENT: Normal NECK: No JVD LYMPHATICS: No lymphadenopathy CARDIAC: irreg irreg, no murmurs, rubs, gallops RESPIRATORY:  Clear to auscultation without rales, wheezing or rhonchi  ABDOMEN: Soft, non-tender, non-distended MUSCULOSKELETAL:  No edema; No deformity  SKIN: Warm and dry. NEUROLOGIC:  Alert and oriented x 3 PSYCHIATRIC:  Normal affect   ASSESSMENT:    1. S/P TAVR (transcatheter aortic valve replacement)   2. Persistent atrial fibrillation ([place])   3. Essential hypertension   4. Hyperlipidemia LDL goal <70     PLAN:    In order of problems listed above:  Severe AS s/p TAVR: echo today showed EF 55%, normally functioning TAVR with a mean gradient of 11 mmHg and no PVL. She has NYHA class II symptoms with a significant improvement in energy and exercise tolerance since TAVR. SBE prophylaxis discussed; I have RX'd azithromycin due to a PCN allergy. Continue on monotherapy with Eliquis.   Persistent afib: rate well controlled. HR in 70s currently today (has has issues with bradycardia in the past). Continue Eliquis '5mg'$  BID.    HTN: BP well controlled. No changes made.     HLD: continue statin   Medication Adjustments/Labs and Tests Ordered: Current medicines are reviewed at length with the patient today.  Concerns regarding medicines are outlined above.  No orders of the defined types were placed in this encounter.    No orders of the defined types were placed in this encounter.     Patient Instructions  Medication Instructions:  Your physician recommends that you continue on your current medications as directed. Please refer to the Current Medication list given to you today.   *If you need a refill on your cardiac medications before your next appointment, please call your pharmacy*   Lab Work: None ordered   If you have labs (blood work) drawn today and your tests are completely normal, you will receive your results only by: [place] (if you have MyChart) OR A paper copy in the mail If you have any lab test that is abnormal or we need to change your treatment, we will call you to  review the results.   Testing/Procedures: None ordered    Follow-Up: Follow up as scheduled    Other Instructions    Important Information About Sugar         Signed, Angelena Form, PA-C  12/10/2021 9:25 AM    [facility]

## 2021-12-18 DIAGNOSIS — M25571 Pain in right ankle and joints of right foot: Secondary | ICD-10-CM | POA: Diagnosis not present

## 2021-12-18 DIAGNOSIS — M25572 Pain in left ankle and joints of left foot: Secondary | ICD-10-CM | POA: Diagnosis not present

## 2021-12-18 DIAGNOSIS — M25532 Pain in left wrist: Secondary | ICD-10-CM | POA: Diagnosis not present

## 2021-12-18 DIAGNOSIS — M25562 Pain in left knee: Secondary | ICD-10-CM | POA: Diagnosis not present

## 2022-01-01 DIAGNOSIS — I10 Essential (primary) hypertension: Secondary | ICD-10-CM | POA: Diagnosis not present

## 2022-01-01 DIAGNOSIS — R7989 Other specified abnormal findings of blood chemistry: Secondary | ICD-10-CM | POA: Diagnosis not present

## 2022-01-01 DIAGNOSIS — E785 Hyperlipidemia, unspecified: Secondary | ICD-10-CM | POA: Diagnosis not present

## 2022-01-03 ENCOUNTER — Telehealth: Payer: Self-pay | Admitting: Physician Assistant

## 2022-01-03 NOTE — Telephone Encounter (Signed)
  Please write on a paper RX under my name and fax. Thank you   KT   Done and faxed

## 2022-01-03 NOTE — Telephone Encounter (Signed)
[employer] [facility] called stating they need documentation that pt is no longer using oxygen concentrator ordered by Grandville Silos, PA.    Fax: [phone]

## 2022-01-08 DIAGNOSIS — I4891 Unspecified atrial fibrillation: Secondary | ICD-10-CM | POA: Diagnosis not present

## 2022-01-08 DIAGNOSIS — D6869 Other thrombophilia: Secondary | ICD-10-CM | POA: Diagnosis not present

## 2022-01-08 DIAGNOSIS — I10 Essential (primary) hypertension: Secondary | ICD-10-CM | POA: Diagnosis not present

## 2022-01-08 DIAGNOSIS — Z0000 Encounter for general adult medical examination without abnormal findings: Secondary | ICD-10-CM | POA: Diagnosis not present

## 2022-01-08 DIAGNOSIS — I25810 Atherosclerosis of coronary artery bypass graft(s) without angina pectoris: Secondary | ICD-10-CM | POA: Diagnosis not present

## 2022-01-08 DIAGNOSIS — D692 Other nonthrombocytopenic purpura: Secondary | ICD-10-CM | POA: Diagnosis not present

## 2022-01-08 DIAGNOSIS — M79671 Pain in right foot: Secondary | ICD-10-CM | POA: Diagnosis not present

## 2022-01-08 DIAGNOSIS — E1169 Type 2 diabetes mellitus with other specified complication: Secondary | ICD-10-CM | POA: Diagnosis not present

## 2022-01-08 DIAGNOSIS — Z1339 Encounter for screening examination for other mental health and behavioral disorders: Secondary | ICD-10-CM | POA: Diagnosis not present

## 2022-01-08 DIAGNOSIS — Z1331 Encounter for screening for depression: Secondary | ICD-10-CM | POA: Diagnosis not present

## 2022-01-08 DIAGNOSIS — R7302 Impaired glucose tolerance (oral): Secondary | ICD-10-CM | POA: Diagnosis not present

## 2022-01-29 ENCOUNTER — Other Ambulatory Visit: Payer: Self-pay | Admitting: Cardiology

## 2022-01-29 DIAGNOSIS — Z952 Presence of prosthetic heart valve: Secondary | ICD-10-CM

## 2022-01-29 NOTE — Telephone Encounter (Signed)
Prescription refill request for Eliquis received. Indication:TAVR Last office visit: 12/07/21 Grand[place] Silos)  Scr: 1.12 (09/05/21)  Age: 88 Weight: 102.4kg  Appropriate dose and refill sent to requested pharmacy.

## 2022-02-07 ENCOUNTER — Telehealth: Payer: Self-pay | Admitting: Physician Assistant

## 2022-02-07 NOTE — Telephone Encounter (Signed)
The patient had a valve operation in Aug of 2022 and she used it until December of 2022 and has not used it since then. She has a Agricultural consultant, 4 small tanks and one backpack oxygen carrier. The patient said she needs a letter from Dr. Copper discharging the oxygen so the company will come pick up the equipment. [employer] medication supply Fax is [phone].

## 2022-02-07 NOTE — Telephone Encounter (Signed)
Patient said that she needs to talk to Dr. Burt Knack or nurse regarding oxygen....says that its very important. Please call back

## 2022-02-13 NOTE — Progress Notes (Unsigned)
Cardiology Office Note   Date:  02/14/2022   ID:  Khristian, Seals [birth year], MRN [id number]  PCP:  Velna Hatchet, MD  Cardiologist:   Minus Breeding, MD   Chief Complaint  Patient presents with   Atrial Fibrillation       History of Present Illness: Lori Price is a 88 y.o. female who presents for follow up of CAD/CABG.    She was in the hospital in July of 2017 with a TIA.  She has had a hypertensive urgency  A sleep study had severe oxygen desaturations and severe periodic limb movements. However, there were some scheduling issues and she never had her CPAP.  I tried to convince her to pursue treatment of this. However, she has not wanted to pursue this.  She had critical AS.  She is status post TAVR.    Since I last saw her she has done well.  She is not having any new cardiovascular symptoms.  She does not notice her atrial fibrillation.  She has no new shortness of breath, PND or orthopnea.  She sleeps chronically in a recliner and has a little dizziness when she gets up from this but she is very careful.  She has had some right leg swelling which she has had chronically since vein harvest but also has probably a flare of her arthritis right now so she is in a little more discomfort.   Past Medical History:  Diagnosis Date   Anemia    Arthritis    Asthma    Atrial fibrillation, persistent (HCC)    BPV (benign positional vertigo)    Carotid artery disease ([place])    s/p R CEA (2007)   Carotid artery occlusion    Coronary artery disease    s/p MI 2001--(catheterizations in 2001 with left main 70% stenosis, LAD 70% followed by 90% stenosis, circumflex 99% stenosis, right coronary artery occluded.    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 Burt Knack and Dr. Cyndia Bent   Sleep apnea    CPAP   no   Vertigo     Past Surgical History:  Procedure Laterality Date   CARDIAC CATHETERIZATION     2001   CARDIAC SURGERY     CAROTID ENDARTERECTOMY Right    cea  Dr. Amedeo Plenty   CARPAL TUNNEL RELEASE Right    CORONARY ARTERY BYPASS GRAFT     2001 (LIMA to LAD, SVG to obtuse marginal, sequential  SVG to RV , marginal branch in the distal right coronary artery.   EYE SURGERY     bilateral cataracts   knee arthoscopy     left carpal tunnel     ORIF SHOULDER FRACTURE Right 08/30/2013   Procedure: HEMI ARTHROPLASTY;  Surgeon: [name], MD;  Location: [facility];  Service: Orthopedics;  Laterality: Right;   right carotid endarterectomy     RIGHT/LEFT HEART CATH AND CORONARY/GRAFT ANGIOGRAPHY N/A 12/01/2020   Procedure: RIGHT/LEFT HEART CATH AND CORONARY/GRAFT ANGIOGRAPHY;  Surgeon: Burnell Blanks, MD;  Location: [facility];  Service: Cardiovascular;  Laterality: N/A;   TEE WITHOUT CARDIOVERSION N/A 01/09/2021   Procedure: TRANSESOPHAGEAL ECHOCARDIOGRAM (TEE);  Surgeon: Sherren Mocha, MD;  Location: [facility];  Service: Open Heart Surgery;  Laterality: N/A;   TONSILLECTOMY     VASCULAR SURGERY       Current Outpatient Medications  Medication Sig Dispense Refill   acetaminophen (TYLENOL) 500 MG tablet Take 1,000 mg by mouth every 6 (six) hours as needed for moderate pain or mild pain.     atenolol (TENORMIN) 25 MG tablet Take 1 tablet (25 mg total) by mouth daily. 90 tablet 3   atorvastatin (LIPITOR) 10 MG tablet TAKE ONE TABLET BY MOUTH DAILY 90 tablet 2   bisacodyl (DULCOLAX) 5 MG EC tablet Take 5-10 mg by mouth daily as needed for moderate constipation.     BREO ELLIPTA 100-25 MCG/INH AEPB Inhale 1 puff into the lungs daily.     chlorthalidone (HYGROTON) 25 MG tablet Take 1 tablet (25 mg total) by mouth daily. 90 tablet 3   ELIQUIS 5 MG TABS tablet TAKE ONE TABLET BY MOUTH TWICE  DAILY 180 tablet 1   fexofenadine (ALLEGRA) 180 MG tablet Take 180 mg by mouth daily as needed for allergies.     furosemide (LASIX) 20 MG tablet Take 20 mg daily. Take an additional 20 mg on days of swelling, weight gain of 3 lb overnight or 5 lb in 1 week 60 tablet 2   Glycerin-Hypromellose-PEG 400 (DRY EYE RELIEF DROPS) 0.2-0.2-1 % SOLN Place 1 drop into both eyes daily as needed (Dry eye).     ipratropium-albuterol (DUONEB) 0.5-2.5 (3) MG/3ML SOLN as needed.     meclizine (ANTIVERT) 25 MG tablet Take 25 mg by mouth daily as needed for dizziness.     nystatin ointment (MYCOSTATIN) Apply 1 application topically 2 (two) times daily. 30 g 3   potassium chloride (KLOR-CON M) 10 MEQ tablet Take 1 tablet by mouth daily. Take additional tablet when taking extra lasix     No current facility-administered medications for this visit.    Allergies:   Codeine, Losartan, Oxycodone, Penicillins, Pentazocine, Quinapril, Sibutramine, Sulfamethoxazole, Sulfonamide derivatives, Ace inhibitors, Ak-mycin [erythromycin], Drug ingredient [modified tree tyrosine adsorbate], and Eggs or egg-derived products    ROS:  Please see the history of present illness.   Otherwise, review of systems are positive for none.   All other systems are reviewed and negative.    PHYSICAL EXAM: VS:  BP 110/68   Pulse 73   Ht '5\' 4"'$  (1.626 m)   Wt 219 lb 3.2 oz (99.4 kg)   SpO2 95%   BMI 37.63 kg/m  , BMI Body mass index is 37.63 kg/m. GEN:  No distress NECK:  No jugular venous distention at 90 degrees, waveform within normal limits, carotid upstroke brisk and symmetric, no bruits, no thyromegaly LYMPHATICS:  No cervical adenopathy LUNGS:  Clear to auscultation bilaterally BACK:  No CVA tenderness CHEST:  Well healed sternotomy scar. HEART:  S1 and S2 within normal limits, no S3 no clicks, no rubs, no murmurs, irregular ABD:  Positive bowel sounds normal in frequency in pitch, no bruits, no rebound, no guarding, unable to  assess midline mass or bruit with the patient seated. EXT:  2 plus pulses throughout, moderat mild right leg edema, no cyanosis no clubbing   EKG:  EKG is not ordered today. Atrial fibrillation, rate 85, premature ventricular contractions, interventricular conduction delay, no acute ST-T wave changes, 09/20/2021   CATH:   Dominance: Right    Recent Labs: 09/05/2021: BUN 40; Creatinine, Ser 1.12; Potassium 3.9; Sodium 142  Lipid Panel    Component Value Date/Time   CHOL 153 02/08/2016 0852   TRIG 213 (H) 02/08/2016 0852   HDL 39 (L) 02/08/2016 0852   CHOLHDL 3.9 02/08/2016 0852   CHOLHDL 7.8 12/14/2015 0542   VLDL UNABLE TO CALCULATE IF TRIGLYCERIDE OVER 400 mg/dL 12/14/2015 0542   LDLCALC 71 02/08/2016 0852   LDLDIRECT 111.8 02/03/2014 0924      Wt Readings from Last 3 Encounters:  02/14/22 219 lb 3.2 oz (99.4 kg)  12/07/21 225 lb 12.8 oz (102.4 kg)  10/05/21 229 lb (103.9 kg)      Other studies Reviewed: Additional studies/ records that were reviewed today include: EP records Review of the above records demonstrates:    Please see elsewhere in the note.     ASSESSMENT AND PLAN:  ATRIAL FIB:  Ms. FARTUN PARADISO has a CHA2DS2 - VASc score of 5.  She is tolerating anticoagulation.  She is on the appropriate dose.  She has good rate control.  CAD:  The patient has no new sypmtoms.  No further cardiovascular testing is indicated.  We will continue with aggressive risk reduction and meds as listed.  HTN:  The blood pressure is at target.  No change in therapy.   SLEEP APNEA:    She does not want therapy for this.  No change in therapy.  CAROTID STENOSIS:    This has been stable and she has been down [facility] at [facility]  DYSLIPIDEMIA:    LDL was 92 with an HDL of 43.  No change in therapy.   TAVR:    She had normal function in July.  I will follow this clinically.  I reviewed this echocardiogram.   Current medicines are reviewed at length with the patient  today.  The patient does not have concerns regarding medicines.  The following changes have been made: None  Labs/ tests ordered today include:   None  No orders of the defined types were placed in this encounter.   Disposition:   Follow-up me in 1 year   Signed, JMinus Breeding MD  02/14/2022 2:48 PM    [place]

## 2022-02-14 ENCOUNTER — Ambulatory Visit: Payer: Medicare Other | Attending: Cardiology | Admitting: Cardiology

## 2022-02-14 ENCOUNTER — Encounter: Payer: Self-pay | Admitting: Cardiology

## 2022-02-14 VITALS — BP 110/68 | HR 73 | Ht 64.0 in | Wt 219.2 lb

## 2022-02-14 DIAGNOSIS — Z952 Presence of prosthetic heart valve: Secondary | ICD-10-CM | POA: Diagnosis not present

## 2022-02-14 DIAGNOSIS — E785 Hyperlipidemia, unspecified: Secondary | ICD-10-CM | POA: Diagnosis not present

## 2022-02-14 DIAGNOSIS — I2510 Atherosclerotic heart disease of native coronary artery without angina pectoris: Secondary | ICD-10-CM | POA: Diagnosis not present

## 2022-02-14 DIAGNOSIS — I6523 Occlusion and stenosis of bilateral carotid arteries: Secondary | ICD-10-CM

## 2022-02-14 DIAGNOSIS — I10 Essential (primary) hypertension: Secondary | ICD-10-CM | POA: Diagnosis not present

## 2022-02-14 DIAGNOSIS — I4819 Other persistent atrial fibrillation: Secondary | ICD-10-CM | POA: Insufficient documentation

## 2022-02-14 NOTE — Patient Instructions (Signed)
Medication Instructions:  NO CHANGES  *If you need a refill on your cardiac medications before your next appointment, please call your pharmacy*  Follow-Up: At [facility], you and your health needs are our priority.  As part of our continuing mission to provide you with exceptional heart care, we have created designated Provider Care Teams.  These Care Teams include your primary Cardiologist (physician) and Advanced Practice Providers (APPs -  Physician Assistants and Nurse Practitioners) who all work together to provide you with the care you need, when you need it.  We recommend signing up for the patient portal called "MyChart".  Sign up information is provided on this After Visit Summary.  MyChart is used to connect with patients for Virtual Visits (Telemedicine).  Patients are able to view lab/test results, encounter notes, upcoming appointments, etc.  Non-urgent messages can be sent to your provider as well.   To learn more about what you can do with MyChart, go to [email or web address].    Your next appointment:   12 month(s)  The format for your next appointment:   In Person  Provider:   Minus Breeding, MD

## 2022-04-04 DIAGNOSIS — D1801 Hemangioma of skin and subcutaneous tissue: Secondary | ICD-10-CM | POA: Diagnosis not present

## 2022-04-04 DIAGNOSIS — L304 Erythema intertrigo: Secondary | ICD-10-CM | POA: Diagnosis not present

## 2022-04-04 DIAGNOSIS — Z85828 Personal history of other malignant neoplasm of skin: Secondary | ICD-10-CM | POA: Diagnosis not present

## 2022-04-04 DIAGNOSIS — L821 Other seborrheic keratosis: Secondary | ICD-10-CM | POA: Diagnosis not present

## 2022-04-04 DIAGNOSIS — B353 Tinea pedis: Secondary | ICD-10-CM | POA: Diagnosis not present

## 2022-04-04 DIAGNOSIS — L720 Epidermal cyst: Secondary | ICD-10-CM | POA: Diagnosis not present

## 2022-04-09 DIAGNOSIS — I25810 Atherosclerosis of coronary artery bypass graft(s) without angina pectoris: Secondary | ICD-10-CM | POA: Diagnosis not present

## 2022-04-09 DIAGNOSIS — D692 Other nonthrombocytopenic purpura: Secondary | ICD-10-CM | POA: Diagnosis not present

## 2022-04-09 DIAGNOSIS — E785 Hyperlipidemia, unspecified: Secondary | ICD-10-CM | POA: Diagnosis not present

## 2022-04-09 DIAGNOSIS — I4891 Unspecified atrial fibrillation: Secondary | ICD-10-CM | POA: Diagnosis not present

## 2022-04-09 DIAGNOSIS — I10 Essential (primary) hypertension: Secondary | ICD-10-CM | POA: Diagnosis not present

## 2022-04-09 DIAGNOSIS — E1169 Type 2 diabetes mellitus with other specified complication: Secondary | ICD-10-CM | POA: Diagnosis not present

## 2022-04-09 DIAGNOSIS — Z952 Presence of prosthetic heart valve: Secondary | ICD-10-CM | POA: Diagnosis not present

## 2022-04-09 DIAGNOSIS — D6869 Other thrombophilia: Secondary | ICD-10-CM | POA: Diagnosis not present

## 2022-04-09 DIAGNOSIS — I350 Nonrheumatic aortic (valve) stenosis: Secondary | ICD-10-CM | POA: Diagnosis not present

## 2022-07-23 ENCOUNTER — Other Ambulatory Visit: Payer: Self-pay | Admitting: Cardiology

## 2022-08-06 DIAGNOSIS — M25532 Pain in left wrist: Secondary | ICD-10-CM | POA: Diagnosis not present

## 2022-08-06 DIAGNOSIS — M79604 Pain in right leg: Secondary | ICD-10-CM | POA: Diagnosis not present

## 2022-08-06 DIAGNOSIS — L03115 Cellulitis of right lower limb: Secondary | ICD-10-CM | POA: Diagnosis not present

## 2022-08-06 DIAGNOSIS — M25572 Pain in left ankle and joints of left foot: Secondary | ICD-10-CM | POA: Diagnosis not present

## 2022-08-06 DIAGNOSIS — D6869 Other thrombophilia: Secondary | ICD-10-CM | POA: Diagnosis not present

## 2022-08-06 DIAGNOSIS — Z952 Presence of prosthetic heart valve: Secondary | ICD-10-CM | POA: Diagnosis not present

## 2022-08-06 DIAGNOSIS — M2550 Pain in unspecified joint: Secondary | ICD-10-CM | POA: Diagnosis not present

## 2022-08-06 DIAGNOSIS — I350 Nonrheumatic aortic (valve) stenosis: Secondary | ICD-10-CM | POA: Diagnosis not present

## 2022-08-06 DIAGNOSIS — I10 Essential (primary) hypertension: Secondary | ICD-10-CM | POA: Diagnosis not present

## 2022-08-06 DIAGNOSIS — I4891 Unspecified atrial fibrillation: Secondary | ICD-10-CM | POA: Diagnosis not present

## 2022-08-19 DIAGNOSIS — K645 Perianal venous thrombosis: Secondary | ICD-10-CM | POA: Diagnosis not present

## 2022-08-19 DIAGNOSIS — K6289 Other specified diseases of anus and rectum: Secondary | ICD-10-CM | POA: Diagnosis not present

## 2022-09-06 ENCOUNTER — Other Ambulatory Visit: Payer: Self-pay | Admitting: Cardiology

## 2022-09-06 DIAGNOSIS — E1169 Type 2 diabetes mellitus with other specified complication: Secondary | ICD-10-CM | POA: Diagnosis not present

## 2022-09-06 DIAGNOSIS — D6869 Other thrombophilia: Secondary | ICD-10-CM | POA: Diagnosis not present

## 2022-09-06 DIAGNOSIS — K645 Perianal venous thrombosis: Secondary | ICD-10-CM | POA: Diagnosis not present

## 2022-09-06 DIAGNOSIS — Z952 Presence of prosthetic heart valve: Secondary | ICD-10-CM

## 2022-09-06 DIAGNOSIS — I25810 Atherosclerosis of coronary artery bypass graft(s) without angina pectoris: Secondary | ICD-10-CM | POA: Diagnosis not present

## 2022-09-06 DIAGNOSIS — D692 Other nonthrombocytopenic purpura: Secondary | ICD-10-CM | POA: Diagnosis not present

## 2022-09-06 DIAGNOSIS — I4891 Unspecified atrial fibrillation: Secondary | ICD-10-CM | POA: Diagnosis not present

## 2022-09-06 DIAGNOSIS — E785 Hyperlipidemia, unspecified: Secondary | ICD-10-CM | POA: Diagnosis not present

## 2022-09-06 DIAGNOSIS — I350 Nonrheumatic aortic (valve) stenosis: Secondary | ICD-10-CM | POA: Diagnosis not present

## 2022-09-06 DIAGNOSIS — I10 Essential (primary) hypertension: Secondary | ICD-10-CM | POA: Diagnosis not present

## 2022-09-09 NOTE — Telephone Encounter (Signed)
Pt last saw Dr Antoine Poche 02/14/22, last labs 09/05/21 Creat 1.12, pt is overdue for labwork.  Will call PCP to request most recent labwork.  Age 89, weight 99.4kg

## 2022-09-10 ENCOUNTER — Other Ambulatory Visit: Payer: Self-pay | Admitting: Cardiology

## 2022-09-10 NOTE — Telephone Encounter (Signed)
Eliquis  refill request received. Patient is 89 years old, weight-99.4kg, Crea-0.90 on 01/03/22 via [facility], Diagnosis-Afib, and last seen by Dr. Antoine Poche on 02/14/22. Dose is appropriate based on dosing criteria. Will send in refill to requested pharmacy.

## 2022-09-16 DIAGNOSIS — M25532 Pain in left wrist: Secondary | ICD-10-CM | POA: Diagnosis not present

## 2022-09-16 DIAGNOSIS — M19032 Primary osteoarthritis, left wrist: Secondary | ICD-10-CM | POA: Diagnosis not present

## 2022-09-16 DIAGNOSIS — M79642 Pain in left hand: Secondary | ICD-10-CM | POA: Diagnosis not present

## 2022-10-01 ENCOUNTER — Other Ambulatory Visit: Payer: Self-pay | Admitting: Nurse Practitioner

## 2022-10-17 DIAGNOSIS — M19032 Primary osteoarthritis, left wrist: Secondary | ICD-10-CM | POA: Diagnosis not present

## 2022-11-05 ENCOUNTER — Telehealth: Payer: Self-pay | Admitting: Cardiology

## 2022-11-05 ENCOUNTER — Other Ambulatory Visit: Payer: Self-pay | Admitting: Nurse Practitioner

## 2022-11-05 ENCOUNTER — Other Ambulatory Visit: Payer: Self-pay | Admitting: Cardiology

## 2022-11-05 MED ORDER — POTASSIUM CHLORIDE CRYS ER 10 MEQ PO TBCR: EXTENDED_RELEASE_TABLET | ORAL | 3 refills | Status: AC

## 2022-11-05 NOTE — Telephone Encounter (Signed)
Spoke with pt pharmacy, aware new script has been sent.

## 2022-11-05 NOTE — Telephone Encounter (Signed)
Pt c/o medication issue:  1. Name of Medication:   potassium chloride (KLOR-CON M) 10 MEQ tablet    2. How are you currently taking this medication (dosage and times per day)?  Take 1 tablet by mouth daily. Take additional tablet when taking extra lasix     3. Are you having a reaction (difficulty breathing--STAT)? No  4. What is your medication issue? Pharmacy stated they sent over a refill request and it was denied due to dosage change but they haven't received a new prescription with the dosage change. Pharmacy would like a callback to discuss further or have a new prescription sent over due to pt running low and needing a refill.

## 2022-11-14 DIAGNOSIS — M19032 Primary osteoarthritis, left wrist: Secondary | ICD-10-CM | POA: Diagnosis not present

## 2022-11-14 DIAGNOSIS — M79642 Pain in left hand: Secondary | ICD-10-CM | POA: Diagnosis not present

## 2022-12-23 DIAGNOSIS — M6281 Muscle weakness (generalized): Secondary | ICD-10-CM | POA: Diagnosis not present

## 2022-12-23 DIAGNOSIS — M1990 Unspecified osteoarthritis, unspecified site: Secondary | ICD-10-CM | POA: Diagnosis not present

## 2022-12-31 DIAGNOSIS — M6281 Muscle weakness (generalized): Secondary | ICD-10-CM | POA: Diagnosis not present

## 2022-12-31 DIAGNOSIS — M1990 Unspecified osteoarthritis, unspecified site: Secondary | ICD-10-CM | POA: Diagnosis not present

## 2023-01-02 DIAGNOSIS — M6281 Muscle weakness (generalized): Secondary | ICD-10-CM | POA: Diagnosis not present

## 2023-01-02 DIAGNOSIS — M1990 Unspecified osteoarthritis, unspecified site: Secondary | ICD-10-CM | POA: Diagnosis not present

## 2023-01-07 DIAGNOSIS — E785 Hyperlipidemia, unspecified: Secondary | ICD-10-CM | POA: Diagnosis not present

## 2023-01-07 DIAGNOSIS — M6281 Muscle weakness (generalized): Secondary | ICD-10-CM | POA: Diagnosis not present

## 2023-01-07 DIAGNOSIS — M1990 Unspecified osteoarthritis, unspecified site: Secondary | ICD-10-CM | POA: Diagnosis not present

## 2023-01-07 DIAGNOSIS — I10 Essential (primary) hypertension: Secondary | ICD-10-CM | POA: Diagnosis not present

## 2023-01-07 DIAGNOSIS — E1169 Type 2 diabetes mellitus with other specified complication: Secondary | ICD-10-CM | POA: Diagnosis not present

## 2023-01-09 ENCOUNTER — Ambulatory Visit: Payer: Medicare Other | Admitting: Cardiology

## 2023-01-09 DIAGNOSIS — M1990 Unspecified osteoarthritis, unspecified site: Secondary | ICD-10-CM | POA: Diagnosis not present

## 2023-01-09 DIAGNOSIS — M6281 Muscle weakness (generalized): Secondary | ICD-10-CM | POA: Diagnosis not present

## 2023-01-09 NOTE — Progress Notes (Signed)
  Electrophysiology Office Note:   Date:  01/10/2023  ID:  Lori, Price Sep 13, 1931, MRN [id number]  Primary Cardiologist: Rollene Rotunda, MD Electrophysiologist: Regan Lemming, MD      History of Present Illness:   Lori Price is a 89 y.o. female with h/o persistent AF, carotid artery disease s/p CEA in 2007, CAD, HTN, HLD, Morbid obesity, s/p TAVR, and OSA seen today for routine electrophysiology followup.   Since last being seen in our clinic the patient reports doing well overall from a cardiac perspective. She has had a slight cough for up to 2 months and is seeing her PCP next week. Otherwise, she denies chest pain, palpitations, dyspnea, PND, orthopnea, nausea, vomiting, dizziness, syncope, edema, weight gain, or early satiety.   Review of systems complete and found to be negative unless listed in HPI.   EP Information / Studies Reviewed:    EKG is ordered today. Personal review as below.  EKG Interpretation Date/Time:  Friday January 10 2023 11:46:40 EDT Ventricular Rate:  72 PR Interval:    QRS Duration:  118 QT Interval:  402 QTC Calculation: 440 R Axis:   -7  Text Interpretation: Atrial fibrillation Left ventricular hypertrophy with QRS widening ( R in aVL , Cornell product ) Confirmed by Maxine Glenn [phone]) on 01/10/2023 11:57:00 AM     Physical Exam:   VS:  BP (!) 98/58   Pulse 72   Ht 5\' 8"  (1.727 m)   Wt 225 lb (102.1 kg)   BMI 34.21 kg/m    Wt Readings from Last 3 Encounters:  01/10/23 225 lb (102.1 kg)  02/14/22 219 lb 3.2 oz (99.4 kg)  12/07/21 225 lb 12.8 oz (102.4 kg)     GEN: Well nourished, well developed in no acute distress NECK: No JVD; No carotid bruits CARDIAC: Regular rate and rhythm, no murmurs, rubs, gallops RESPIRATORY:  Clear to auscultation without rales, wheezing or rhonchi  ABDOMEN: Soft, non-tender, non-distended EXTREMITIES:  No edema; No deformity   ASSESSMENT AND PLAN:    Persistent atrial fibrillation Continue  eliquis for CHA2DS2/VASc of at least 8 Planning rate control strategy.  CAD s/p CABG No s/s of ischemia.     Severe AS s/p TAVR 2022 Stable last echo  HTN Now soft.  If remains low can decrease atenolol or change to toprol For now, will move atenolol to at bedtime to see if better BP during the day.   Tachy-brady Rates have been borderline.  No plans for pacer at this time unless having symptoms.    PVCs 21.5% by monitor 10/2021. None on EKG today.  EF 55% 11/2021    Follow up with Dr. Elberta Fortis in 12 months. Sooner with issues.   Signed, Graciella Freer, PA-C

## 2023-01-10 ENCOUNTER — Ambulatory Visit: Payer: Medicare Other | Attending: Cardiology | Admitting: Student

## 2023-01-10 ENCOUNTER — Encounter: Payer: Self-pay | Admitting: Student

## 2023-01-10 VITALS — BP 98/58 | HR 72 | Ht 68.0 in | Wt 225.0 lb

## 2023-01-10 DIAGNOSIS — I4819 Other persistent atrial fibrillation: Secondary | ICD-10-CM | POA: Diagnosis not present

## 2023-01-10 DIAGNOSIS — I10 Essential (primary) hypertension: Secondary | ICD-10-CM | POA: Diagnosis not present

## 2023-01-10 DIAGNOSIS — I2510 Atherosclerotic heart disease of native coronary artery without angina pectoris: Secondary | ICD-10-CM | POA: Insufficient documentation

## 2023-01-10 DIAGNOSIS — Z952 Presence of prosthetic heart valve: Secondary | ICD-10-CM | POA: Insufficient documentation

## 2023-01-10 NOTE — Patient Instructions (Signed)
Medication Instructions:  Your physician recommends that you continue on your current medications as directed. Please refer to the Current Medication list given to you today.  *If you need a refill on your cardiac medications before your next appointment, please call your pharmacy*  Lab Work: None ordered If you have labs (blood work) drawn today and your tests are completely normal, you will receive your results only by: MyChart Message (if you have MyChart) OR A paper copy in the mail If you have any lab test that is abnormal or we need to change your treatment, we will call you to review the results.  Follow-Up: At [facility], you and your health needs are our priority.  As part of our continuing mission to provide you with exceptional heart care, we have created designated Provider Care Teams.  These Care Teams include your primary Cardiologist (physician) and Advanced Practice Providers (APPs -  Physician Assistants and Nurse Practitioners) who all work together to provide you with the care you need, when you need it.  Your next appointment:   1 year(s)  Provider:   Will Camnitz, MD  

## 2023-01-14 DIAGNOSIS — D6869 Other thrombophilia: Secondary | ICD-10-CM | POA: Diagnosis not present

## 2023-01-14 DIAGNOSIS — I4891 Unspecified atrial fibrillation: Secondary | ICD-10-CM | POA: Diagnosis not present

## 2023-01-14 DIAGNOSIS — Z952 Presence of prosthetic heart valve: Secondary | ICD-10-CM | POA: Diagnosis not present

## 2023-01-14 DIAGNOSIS — R2689 Other abnormalities of gait and mobility: Secondary | ICD-10-CM | POA: Diagnosis not present

## 2023-01-14 DIAGNOSIS — E1169 Type 2 diabetes mellitus with other specified complication: Secondary | ICD-10-CM | POA: Diagnosis not present

## 2023-01-14 DIAGNOSIS — R29898 Other symptoms and signs involving the musculoskeletal system: Secondary | ICD-10-CM | POA: Diagnosis not present

## 2023-01-14 DIAGNOSIS — I10 Essential (primary) hypertension: Secondary | ICD-10-CM | POA: Diagnosis not present

## 2023-01-14 DIAGNOSIS — Z0000 Encounter for general adult medical examination without abnormal findings: Secondary | ICD-10-CM | POA: Diagnosis not present

## 2023-01-14 DIAGNOSIS — E785 Hyperlipidemia, unspecified: Secondary | ICD-10-CM | POA: Diagnosis not present

## 2023-01-14 DIAGNOSIS — I129 Hypertensive chronic kidney disease with stage 1 through stage 4 chronic kidney disease, or unspecified chronic kidney disease: Secondary | ICD-10-CM | POA: Diagnosis not present

## 2023-01-14 DIAGNOSIS — D692 Other nonthrombocytopenic purpura: Secondary | ICD-10-CM | POA: Diagnosis not present

## 2023-01-14 DIAGNOSIS — I25810 Atherosclerosis of coronary artery bypass graft(s) without angina pectoris: Secondary | ICD-10-CM | POA: Diagnosis not present

## 2023-01-14 DIAGNOSIS — N1832 Chronic kidney disease, stage 3b: Secondary | ICD-10-CM | POA: Diagnosis not present

## 2023-01-16 DIAGNOSIS — M1990 Unspecified osteoarthritis, unspecified site: Secondary | ICD-10-CM | POA: Diagnosis not present

## 2023-01-16 DIAGNOSIS — M6281 Muscle weakness (generalized): Secondary | ICD-10-CM | POA: Diagnosis not present

## 2023-01-21 DIAGNOSIS — R2689 Other abnormalities of gait and mobility: Secondary | ICD-10-CM | POA: Diagnosis not present

## 2023-01-21 DIAGNOSIS — M1990 Unspecified osteoarthritis, unspecified site: Secondary | ICD-10-CM | POA: Diagnosis not present

## 2023-01-21 DIAGNOSIS — M6281 Muscle weakness (generalized): Secondary | ICD-10-CM | POA: Diagnosis not present

## 2023-01-21 DIAGNOSIS — R29898 Other symptoms and signs involving the musculoskeletal system: Secondary | ICD-10-CM | POA: Diagnosis not present

## 2023-02-10 DIAGNOSIS — I482 Chronic atrial fibrillation, unspecified: Secondary | ICD-10-CM | POA: Insufficient documentation

## 2023-02-10 NOTE — Progress Notes (Deleted)
Cardiology Office Note:   Date:  02/10/2023  ID:  Lori Price, DOB 1932-03-10, MRN [id number] PCP: Alysia Penna, MD  [facility] Providers Cardiologist:  Rollene Rotunda, MD Electrophysiologist:  Will Jorja Loa, MD {  History of Present Illness:   Lori Price is a 89 y.o. female  who presents for follow up of CAD/CABG.    She was in the hospital in July of 2017 with a TIA.  She has had a hypertensive urgency  A sleep study had severe oxygen desaturations and severe periodic limb movements. However, there were some scheduling issues and she never had her CPAP.  I tried to convince her to pursue treatment of this. However, she has not wanted to pursue this.  She had critical AS.  She is status post TAVR.     Since I last saw her ***   ***  she has done well.  She is not having any new cardiovascular symptoms.  She does not notice her atrial fibrillation.  She has no new shortness of breath, PND or orthopnea.  She sleeps chronically in a recliner and has a little dizziness when she gets up from this but she is very careful.  She has had some right leg swelling which she has had chronically since vein harvest but also has probably a flare of her arthritis right now so she is in a little more discomfort.  ROS: ***  Studies Reviewed:    EKG:       ***  Risk Assessment/Calculations:   {Does this patient have ATRIAL FIBRILLATION?:[id number]} No BP recorded.  {Refresh Note OR Click here to enter BP  :1}***        Physical Exam:   VS:  There were no vitals taken for this visit.   Wt Readings from Last 3 Encounters:  01/10/23 225 lb (102.1 kg)  02/14/22 219 lb 3.2 oz (99.4 kg)  12/07/21 225 lb 12.8 oz (102.4 kg)     GEN: Well nourished, well developed in no acute distress NECK: No JVD; No carotid bruits CARDIAC: ***Irregular RR, *** murmurs, rubs, gallops RESPIRATORY:  Clear to auscultation without rales, wheezing or rhonchi  ABDOMEN: Soft, non-tender,  non-distended EXTREMITIES:  No edema; No deformity   ASSESSMENT AND PLAN:   ATRIAL FIB:  Lori Price has a CHA2DS2 - VASc score of 5.  ***   She is tolerating anticoagulation.  She is on the appropriate dose.  She has good rate control.   CAD:    ***  The patient has no new sypmtoms.  No further cardiovascular testing is indicated.  We will continue with aggressive risk reduction and meds as listed.   HTN:  The blood pressure is ***  at target.  No change in therapy.    SLEEP APNEA:    ***  She does not want therapy for this.  No change in therapy.   CAROTID STENOSIS:    This was mild on Doppler in October 2022.  ***   has been stable and she has been down to 44-month follow-up at [facility]   DYSLIPIDEMIA:    LDL was ***   92 with an HDL of 43.  No change in therapy.   TAVR:    She had normal function in July 2023.  ***    I will follow this clinically.  I reviewed this echocardiogram.     {Are you ordering a CV Procedure (e.g. stress test, cath, DCCV, TEE, etc)?  Press F2        :[id number]}  Follow up ***  Signed, Rollene Rotunda, MD

## 2023-02-11 ENCOUNTER — Ambulatory Visit: Payer: Medicare Other | Attending: Cardiology | Admitting: Cardiology

## 2023-02-11 DIAGNOSIS — R29898 Other symptoms and signs involving the musculoskeletal system: Secondary | ICD-10-CM | POA: Diagnosis not present

## 2023-02-11 DIAGNOSIS — I10 Essential (primary) hypertension: Secondary | ICD-10-CM

## 2023-02-11 DIAGNOSIS — I482 Chronic atrial fibrillation, unspecified: Secondary | ICD-10-CM

## 2023-02-11 DIAGNOSIS — M1990 Unspecified osteoarthritis, unspecified site: Secondary | ICD-10-CM | POA: Diagnosis not present

## 2023-02-11 DIAGNOSIS — R2689 Other abnormalities of gait and mobility: Secondary | ICD-10-CM | POA: Diagnosis not present

## 2023-02-11 DIAGNOSIS — E785 Hyperlipidemia, unspecified: Secondary | ICD-10-CM

## 2023-02-11 DIAGNOSIS — M6281 Muscle weakness (generalized): Secondary | ICD-10-CM | POA: Diagnosis not present

## 2023-02-11 DIAGNOSIS — Z952 Presence of prosthetic heart valve: Secondary | ICD-10-CM

## 2023-02-12 ENCOUNTER — Encounter: Payer: Self-pay | Admitting: Cardiology

## 2023-02-13 DIAGNOSIS — M1990 Unspecified osteoarthritis, unspecified site: Secondary | ICD-10-CM | POA: Diagnosis not present

## 2023-02-13 DIAGNOSIS — R29898 Other symptoms and signs involving the musculoskeletal system: Secondary | ICD-10-CM | POA: Diagnosis not present

## 2023-02-13 DIAGNOSIS — R2689 Other abnormalities of gait and mobility: Secondary | ICD-10-CM | POA: Diagnosis not present

## 2023-02-13 DIAGNOSIS — M6281 Muscle weakness (generalized): Secondary | ICD-10-CM | POA: Diagnosis not present

## 2023-02-18 ENCOUNTER — Other Ambulatory Visit: Payer: Self-pay | Admitting: *Deleted

## 2023-02-18 DIAGNOSIS — R2689 Other abnormalities of gait and mobility: Secondary | ICD-10-CM | POA: Diagnosis not present

## 2023-02-18 DIAGNOSIS — R29898 Other symptoms and signs involving the musculoskeletal system: Secondary | ICD-10-CM | POA: Diagnosis not present

## 2023-02-18 DIAGNOSIS — M6281 Muscle weakness (generalized): Secondary | ICD-10-CM | POA: Diagnosis not present

## 2023-02-18 DIAGNOSIS — I6523 Occlusion and stenosis of bilateral carotid arteries: Secondary | ICD-10-CM

## 2023-02-20 DIAGNOSIS — R2689 Other abnormalities of gait and mobility: Secondary | ICD-10-CM | POA: Diagnosis not present

## 2023-02-20 DIAGNOSIS — R29898 Other symptoms and signs involving the musculoskeletal system: Secondary | ICD-10-CM | POA: Diagnosis not present

## 2023-02-20 DIAGNOSIS — M6281 Muscle weakness (generalized): Secondary | ICD-10-CM | POA: Diagnosis not present

## 2023-02-24 DIAGNOSIS — R29898 Other symptoms and signs involving the musculoskeletal system: Secondary | ICD-10-CM | POA: Diagnosis not present

## 2023-02-24 DIAGNOSIS — R2689 Other abnormalities of gait and mobility: Secondary | ICD-10-CM | POA: Diagnosis not present

## 2023-02-24 DIAGNOSIS — M6281 Muscle weakness (generalized): Secondary | ICD-10-CM | POA: Diagnosis not present

## 2023-02-25 ENCOUNTER — Other Ambulatory Visit: Payer: Self-pay | Admitting: Cardiology

## 2023-02-28 DIAGNOSIS — M6281 Muscle weakness (generalized): Secondary | ICD-10-CM | POA: Diagnosis not present

## 2023-02-28 DIAGNOSIS — R29898 Other symptoms and signs involving the musculoskeletal system: Secondary | ICD-10-CM | POA: Diagnosis not present

## 2023-02-28 DIAGNOSIS — R2689 Other abnormalities of gait and mobility: Secondary | ICD-10-CM | POA: Diagnosis not present

## 2023-03-03 DIAGNOSIS — R29898 Other symptoms and signs involving the musculoskeletal system: Secondary | ICD-10-CM | POA: Diagnosis not present

## 2023-03-03 DIAGNOSIS — M6281 Muscle weakness (generalized): Secondary | ICD-10-CM | POA: Diagnosis not present

## 2023-03-03 DIAGNOSIS — R2689 Other abnormalities of gait and mobility: Secondary | ICD-10-CM | POA: Diagnosis not present

## 2023-03-04 ENCOUNTER — Ambulatory Visit (INDEPENDENT_AMBULATORY_CARE_PROVIDER_SITE_OTHER): Payer: Medicare Other | Admitting: Physician Assistant

## 2023-03-04 ENCOUNTER — Ambulatory Visit (HOSPITAL_COMMUNITY)
Admission: RE | Admit: 2023-03-04 | Discharge: 2023-03-04 | Disposition: A | Discharge status: Home / Self Care | Payer: Medicare Other | Source: Ambulatory Visit | Attending: Vascular Surgery | Admitting: Vascular Surgery

## 2023-03-04 VITALS — BP 125/73 | HR 59 | Temp 98.6°F

## 2023-03-04 DIAGNOSIS — I6523 Occlusion and stenosis of bilateral carotid arteries: Secondary | ICD-10-CM | POA: Diagnosis not present

## 2023-03-04 DIAGNOSIS — Z9889 Other specified postprocedural states: Secondary | ICD-10-CM

## 2023-03-04 NOTE — Progress Notes (Signed)
Office Note     CC:  follow up Requesting Provider:  Alysia Penna, MD  HPI: Lori Price is a 89 y.o. (Jul 07, 1931) female who presents for surveillance of carotid artery stenosis. She has remote history of right CEA in January of 2007 by Dr. Madilyn Fireman. She overall has done very well since without any TIA or stroke like symptoms.   Today she denies any visual changes, slurred speech, facial drooping,unilateral upper or lower extremity weakness or numbness. She does not report any major changes in her medical history since she was here in 2022.  She has hx of CABG x 4 and is followed by Dr. Antoine Poche for irregular heart rhythm managed with Eliquis.   The pt is on a statin for cholesterol management.  The pt is on a daily aspirin. Other AC: Eliquis The pt is on BB for hypertension.   The pt is not diabetic.   Tobacco hx:  never  Past Medical History:  Diagnosis Date   Anemia    Arthritis    Asthma    Atrial fibrillation, persistent (HCC)    BPV (benign positional vertigo)    Carotid artery disease (HCC)    s/p R CEA (2007)   Carotid artery occlusion    Coronary artery disease    s/p MI 2001--(catheterizations in 2001 with left main 70% stenosis, LAD 70% followed by 90% stenosis, circumflex 99% stenosis, right coronary artery occluded.    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 Excell Seltzer and Dr. Laneta Simmers   Sleep apnea    CPAP   no   Vertigo     Past Surgical History:  Procedure Laterality Date   CARDIAC CATHETERIZATION     2001   CARDIAC SURGERY     CAROTID ENDARTERECTOMY Right    cea  Dr. Madilyn Fireman   CARPAL TUNNEL RELEASE Right    CORONARY ARTERY BYPASS GRAFT     2001 (LIMA to LAD, SVG to obtuse marginal, sequential   SVG to RV , marginal branch in the distal right coronary artery.   EYE SURGERY     bilateral cataracts   knee arthoscopy     left carpal tunnel     ORIF SHOULDER FRACTURE Right 08/30/2013   Procedure: HEMI ARTHROPLASTY;  Surgeon: Verlee Rossetti, MD;  Location: [facility] OR;  Service: Orthopedics;  Laterality: Right;   right carotid endarterectomy     RIGHT/LEFT HEART CATH AND CORONARY/GRAFT ANGIOGRAPHY N/A 12/01/2020   Procedure: RIGHT/LEFT HEART CATH AND CORONARY/GRAFT ANGIOGRAPHY;  Surgeon: Kathleene Hazel, MD;  Location: [facility] INVASIVE CV LAB;  Service: Cardiovascular;  Laterality: N/A;   TEE WITHOUT CARDIOVERSION N/A 01/09/2021   Procedure: TRANSESOPHAGEAL ECHOCARDIOGRAM (TEE);  Surgeon: Tonny Bollman, MD;  Location: [facility] INVASIVE CV LAB;  Service: Open Heart Surgery;  Laterality: N/A;   TONSILLECTOMY     VASCULAR SURGERY      Social History   Socioeconomic History   Marital status: Married    Spouse name: Not on file   Number of children: Not on file   Years of education: Not on file   Highest education level: Not on file  Occupational History   Not on file  Tobacco Use  Smoking status: Never   Smokeless tobacco: Never  Vaping Use   Vaping status: Never Used  Substance and Sexual Activity   Alcohol use: Not Currently   Drug use: Not Currently   Sexual activity: Not on file  Other Topics Concern   Not on file  Social History Narrative   ** Merged History Encounter **       Social Determinants of Health   Financial Resource Strain: Not on file  Food Insecurity: Not on file  Transportation Needs: Not on file  Physical Activity: Not on file  Stress: Not on file  Social Connections: Not on file  Intimate Partner Violence: Not on file    Family History  Problem Relation Age of Onset   Ovarian cancer Mother    Cancer Mother        Ovarian   COPD Father    Aneurysm Father        Throat   Kidney disease Father    Heart disease Maternal Grandmother    Heart  disease Maternal Grandfather     Current Outpatient Medications  Medication Sig Dispense Refill   acetaminophen (TYLENOL) 500 MG tablet Take 1,000 mg by mouth every 6 (six) hours as needed for moderate pain or mild pain.     atenolol (TENORMIN) 25 MG tablet TAKE ONE TABLET BY MOUTH DAILY 90 tablet 3   atorvastatin (LIPITOR) 10 MG tablet TAKE ONE TABLET BY MOUTH DAILY 90 tablet 0   bisacodyl (DULCOLAX) 5 MG EC tablet Take 5-10 mg by mouth daily as needed for moderate constipation.     BREO ELLIPTA 100-25 MCG/INH AEPB Inhale 1 puff into the lungs daily.     chlorthalidone (HYGROTON) 25 MG tablet TAKE ONE TABLET BY MOUTH DAILY 90 tablet 3   ELIQUIS 5 MG TABS tablet TAKE ONE TABLET BY MOUTH TWICE DAILY 180 tablet 1   fexofenadine (ALLEGRA) 180 MG tablet Take 180 mg by mouth daily as needed for allergies.     furosemide (LASIX) 20 MG tablet Take 20 mg daily. Take an additional 20 mg on days of swelling, weight gain of 3 lb overnight or 5 lb in 1 week (Patient taking differently: Take an additional 20 mg on days of swelling, weight gain of 3 lb overnight or 5 lb in 1 week) 60 tablet 2   furosemide (LASIX) 40 MG tablet Take 1 tablet (40 mg total) by mouth daily. Please call [phone] to schedule an appointment with Dr. Rollene Rotunda for future refills. Thank you. 1st attempt. 30 tablet 0   Glycerin-Hypromellose-PEG 400 (DRY EYE RELIEF DROPS) 0.2-0.2-1 % SOLN Place 1 drop into both eyes daily as needed (Dry eye).     ipratropium-albuterol (DUONEB) 0.5-2.5 (3) MG/3ML SOLN as needed.     meclizine (ANTIVERT) 25 MG tablet Take 25 mg by mouth daily as needed for dizziness.     nystatin ointment (MYCOSTATIN) Apply 1 application topically 2 (two) times daily. 30 g 3   potassium chloride (KLOR-CON M) 10 MEQ tablet Take 1 tablet by mouth daily. Take additional tablet when taking extra lasix 180 tablet 3   potassium chloride (KLOR-CON) 10 MEQ tablet Take 1 tablet (10 mEq total) by mouth daily. 90 tablet 3    No current facility-administered medications for this visit.    Allergies  Allergen Reactions   Codeine Hives, Itching and Other (See Comments)    extreme agitation   Losartan Other (See Comments)    "I can't walk, I can't do anything"  Oxycodone Hives and Itching   Penicillins Swelling and Rash    Has patient had a PCN reaction causing immediate rash, facial/tongue/throat swelling, SOB or lightheadedness with hypotension: NO Has patient had a PCN reaction causing severe rash involving mucus membranes or skin necrosis: NO Has patient had a PCN reaction that required hospitalization: NO  Has patient had a PCN reaction occurring within the last 10 years: NO If all of the above answers are "NO", then may proceed with Cephalosporin use.     Pentazocine Other (See Comments) and Nausea And Vomiting   Quinapril Cough   Sibutramine Palpitations   Sulfamethoxazole Hives and Nausea And Vomiting    hives   Sulfonamide Derivatives Rash   Ace Inhibitors Cough   Ak-Mycin [Erythromycin] Other (See Comments)    unknown   Drug Ingredient [Modified Tree Tyrosine Adsorbate] Other (See Comments)    unknown   Egg-Derived Products Hives, Diarrhea and Other (See Comments)    Pt stated "My esophagus swells, I get hives, diarrhea, and I pass out"     REVIEW OF SYSTEMS:  [X]  denotes positive finding, [ ]  denotes negative finding Cardiac  Comments:  Chest pain or chest pressure:    Shortness of breath upon exertion:    Short of breath when lying flat:    Irregular heart rhythm:        Vascular    Pain in calf, thigh, or hip brought on by ambulation:    Pain in feet at night that wakes you up from your sleep:     Blood clot in your veins:    Leg swelling:  X       Pulmonary    Oxygen at home:    Productive cough:     Wheezing:         Neurologic    Sudden weakness in arms or legs:     Sudden numbness in arms or legs:     Sudden onset of difficulty speaking or slurred speech:     Temporary loss of vision in one eye:     Problems with dizziness:         Gastrointestinal    Blood in stool:     Vomited blood:         Genitourinary    Burning when urinating:     Blood in urine:        Psychiatric    Major depression:         Hematologic    Bleeding problems:    Problems with blood clotting too easily:        Skin    Rashes or ulcers:        Constitutional    Fever or chills:      PHYSICAL EXAMINATION:  Vitals:   03/04/23 1512  BP: 125/73  Pulse: (!) 59  Temp: 98.6 F (37 C)  SpO2: 91%    General:  WDWN in NAD; vital signs documented above Gait: Normal, uses rolling walker HENT: WNL, normocephalic Pulmonary: normal non-labored breathing without wheezing Cardiac: regular HR Abdomen: soft Extremities: without ischemic changes, without Gangrene , without cellulitis; without open wounds; moving all extremities without deficits. 2+ radial pulses bilaterally, 2+ DP pulses bilaterally. Edematous feet and lower legs Musculoskeletal: no muscle wasting or atrophy  Neurologic: A&O X 3 Psychiatric:  The pt has Normal affect.   Non-Invasive Vascular Imaging:   VAS US Carotid Duplex: Summary:  Right Carotid: Velocities in the right ICA are consistent with a 40-59%  stenosis.   Left Carotid: Velocities in the left ICA are consistent with a 1-39% stenosis.   Vertebrals: Bilateral vertebral arteries demonstrate antegrade flow.  Subclavians: Right subclavian artery flow was disturbed. Normal flow hemodynamics were seen in the left subclavian artery.     ASSESSMENT/PLAN:: 89 y.o. female here for surveillance of carotid artery stenosis. She has remote history of right CEA in January of 2007 by Dr. Madilyn Fireman. She overall has done very well since without any TIA or stroke like symptoms. - Duplex today shows right ICA stenosis of 40-59%, left ICA 1-39% stenosis. Her right ICA velocities have increased very slightly - Continue Statin and Eliquis - Reviewed  signs and symptoms of TIA/ Stroke and she understands should these occur to seek immediate medical attention - She will follow up in 1 year with repeat carotid duplex   Graceann Congress, PA-C [facility] [phone]  Clinic MD: Steve Rattler

## 2023-03-05 DIAGNOSIS — R29898 Other symptoms and signs involving the musculoskeletal system: Secondary | ICD-10-CM | POA: Diagnosis not present

## 2023-03-05 DIAGNOSIS — R2689 Other abnormalities of gait and mobility: Secondary | ICD-10-CM | POA: Diagnosis not present

## 2023-03-05 DIAGNOSIS — M6281 Muscle weakness (generalized): Secondary | ICD-10-CM | POA: Diagnosis not present

## 2023-03-07 DIAGNOSIS — M6281 Muscle weakness (generalized): Secondary | ICD-10-CM | POA: Diagnosis not present

## 2023-03-07 DIAGNOSIS — R29898 Other symptoms and signs involving the musculoskeletal system: Secondary | ICD-10-CM | POA: Diagnosis not present

## 2023-03-07 DIAGNOSIS — R2689 Other abnormalities of gait and mobility: Secondary | ICD-10-CM | POA: Diagnosis not present

## 2023-03-10 DIAGNOSIS — R2689 Other abnormalities of gait and mobility: Secondary | ICD-10-CM | POA: Diagnosis not present

## 2023-03-10 DIAGNOSIS — M6281 Muscle weakness (generalized): Secondary | ICD-10-CM | POA: Diagnosis not present

## 2023-03-10 DIAGNOSIS — R29898 Other symptoms and signs involving the musculoskeletal system: Secondary | ICD-10-CM | POA: Diagnosis not present

## 2023-03-14 DIAGNOSIS — R29898 Other symptoms and signs involving the musculoskeletal system: Secondary | ICD-10-CM | POA: Diagnosis not present

## 2023-03-14 DIAGNOSIS — R2689 Other abnormalities of gait and mobility: Secondary | ICD-10-CM | POA: Diagnosis not present

## 2023-03-14 DIAGNOSIS — M6281 Muscle weakness (generalized): Secondary | ICD-10-CM | POA: Diagnosis not present

## 2023-03-28 ENCOUNTER — Other Ambulatory Visit: Payer: Self-pay

## 2023-03-28 ENCOUNTER — Other Ambulatory Visit: Payer: Self-pay | Admitting: Cardiology

## 2023-03-28 DIAGNOSIS — I6523 Occlusion and stenosis of bilateral carotid arteries: Secondary | ICD-10-CM

## 2023-04-30 NOTE — Progress Notes (Unsigned)
  Cardiology Office Note:   Date:  05/01/2023  ID:  Lori Price, DOB 07/10/1931, MRN [id number] PCP: Alysia Penna, MD  [facility] Providers Cardiologist:  Rollene Rotunda, MD Electrophysiologist:  Will Jorja Loa, MD {  History of Present Illness:   Lori Price is a 89 y.o. female who presents for follow up of CAD/CABG.    She was in the hospital in July of 2017 with a TIA.  She has had a hypertensive urgency  A sleep study had severe oxygen desaturations and severe periodic limb movements. However, there were some scheduling issues and she never had her CPAP.  I tried to convince her to pursue treatment of this. However, she has not wanted to pursue this.  She had critical AS.  She is status post TAVR.     Since I last saw her her husband of 68 years died after a prolonged illness.  She lives at [place].  She gets around with a walker. The patient denies any new symptoms such as chest discomfort, neck or arm discomfort. There has been no new shortness of breath, PND or orthopnea. There have been no reported palpitations, presyncope or syncope.     ROS: .As stated in the HPI and negative for all other systems.  Studies Reviewed:    EKG: NA    Risk Assessment/Calculations:    CHA2DS2-VASc Score = 5   This indicates a 7.2% annual risk of stroke. The patient's score is based upon: CHF History: 0 HTN History: 1 Diabetes History: 0 Stroke History: 0 Vascular Disease History: 1 Age Score: 2 Gender Score: 1   Physical Exam:   VS:  BP 136/75 (BP Location: Left Arm, Patient Position: Sitting, Cuff Size: Large)   Pulse 72   Ht 5\' 7"  (1.702 m)   Wt 223 lb (101.2 kg)   SpO2 90%   BMI 34.93 kg/m    Wt Readings from Last 3 Encounters:  05/01/23 223 lb (101.2 kg)  01/10/23 225 lb (102.1 kg)  02/14/22 219 lb 3.2 oz (99.4 kg)     GEN: Well nourished, well developed in no acute distress NECK: No JVD; No carotid bruits CARDIAC: Irregular RR, soft systolic apical  murmurs, rubs, gallops RESPIRATORY:  Clear to auscultation without rales, wheezing or rhonchi  ABDOMEN: Soft, non-tender, non-distended EXTREMITIES:  Moderate right greater than left leg edema; No deformity   ASSESSMENT AND PLAN:   ATRIAL FIB:  Lori Price has a CHA2DS2 - VASc score of 5.   She tolerates anticoagulation.  No change in therapy.  CAD:    The patient has no new sypmtoms.  No further cardiovascular testing is indicated.  We will continue with aggressive risk reduction and meds as listed.   HTN:  The blood pressure is at target.  No change in therapy.   SLEEP APNEA:    She does not want therapy for this.    CAROTID STENOSIS:   She is being followed by [facility].    DYSLIPIDEMIA:    LDL was 84.  No change in therapy.    TAVR:    She had normal function in July 2023.  I will follow this clinically.       Follow up with me in one year.   Signed, Rollene Rotunda, MD

## 2023-05-01 ENCOUNTER — Encounter: Payer: Self-pay | Admitting: Cardiology

## 2023-05-01 ENCOUNTER — Ambulatory Visit: Payer: Medicare Other | Attending: Cardiology | Admitting: Cardiology

## 2023-05-01 VITALS — BP 136/75 | HR 72 | Ht 67.0 in | Wt 223.0 lb

## 2023-05-01 DIAGNOSIS — Z952 Presence of prosthetic heart valve: Secondary | ICD-10-CM | POA: Insufficient documentation

## 2023-05-01 DIAGNOSIS — I10 Essential (primary) hypertension: Secondary | ICD-10-CM | POA: Insufficient documentation

## 2023-05-01 DIAGNOSIS — E785 Hyperlipidemia, unspecified: Secondary | ICD-10-CM | POA: Diagnosis not present

## 2023-05-01 DIAGNOSIS — I482 Chronic atrial fibrillation, unspecified: Secondary | ICD-10-CM | POA: Insufficient documentation

## 2023-05-01 NOTE — Patient Instructions (Signed)
 Medication Instructions:  No changes.  *If you need a refill on your cardiac medications before your next appointment, please call your pharmacy*     Follow-Up: At [facility], you and your health needs are our priority.  As part of our continuing mission to provide you with exceptional heart care, we have created designated Provider Care Teams.  These Care Teams include your primary Cardiologist (physician) and Advanced Practice Providers (APPs -  Physician Assistants and Nurse Practitioners) who all work together to provide you with the care you need, when you need it.  Your next appointment:   12 month(s)  Provider:   Rollene Rotunda, MD

## 2023-05-06 ENCOUNTER — Other Ambulatory Visit: Payer: Self-pay | Admitting: Cardiology

## 2023-05-06 DIAGNOSIS — Z952 Presence of prosthetic heart valve: Secondary | ICD-10-CM

## 2023-06-03 DIAGNOSIS — M7989 Other specified soft tissue disorders: Secondary | ICD-10-CM | POA: Diagnosis not present

## 2023-06-03 DIAGNOSIS — M25571 Pain in right ankle and joints of right foot: Secondary | ICD-10-CM | POA: Diagnosis not present

## 2023-06-04 DIAGNOSIS — M51361 Other intervertebral disc degeneration, lumbar region with lower extremity pain only: Secondary | ICD-10-CM | POA: Diagnosis not present

## 2023-06-04 DIAGNOSIS — E119 Type 2 diabetes mellitus without complications: Secondary | ICD-10-CM | POA: Diagnosis not present

## 2023-06-04 DIAGNOSIS — E785 Hyperlipidemia, unspecified: Secondary | ICD-10-CM | POA: Diagnosis not present

## 2023-06-04 DIAGNOSIS — R2689 Other abnormalities of gait and mobility: Secondary | ICD-10-CM | POA: Diagnosis not present

## 2023-06-04 DIAGNOSIS — I482 Chronic atrial fibrillation, unspecified: Secondary | ICD-10-CM | POA: Diagnosis not present

## 2023-06-04 DIAGNOSIS — L22 Diaper dermatitis: Secondary | ICD-10-CM | POA: Diagnosis not present

## 2023-06-04 DIAGNOSIS — R278 Other lack of coordination: Secondary | ICD-10-CM | POA: Diagnosis not present

## 2023-06-04 DIAGNOSIS — M6281 Muscle weakness (generalized): Secondary | ICD-10-CM | POA: Diagnosis not present

## 2023-06-05 DIAGNOSIS — M6281 Muscle weakness (generalized): Secondary | ICD-10-CM | POA: Diagnosis not present

## 2023-06-05 DIAGNOSIS — M51361 Other intervertebral disc degeneration, lumbar region with lower extremity pain only: Secondary | ICD-10-CM | POA: Diagnosis not present

## 2023-06-05 DIAGNOSIS — I482 Chronic atrial fibrillation, unspecified: Secondary | ICD-10-CM | POA: Diagnosis not present

## 2023-06-05 DIAGNOSIS — R278 Other lack of coordination: Secondary | ICD-10-CM | POA: Diagnosis not present

## 2023-06-05 DIAGNOSIS — R2689 Other abnormalities of gait and mobility: Secondary | ICD-10-CM | POA: Diagnosis not present

## 2023-06-06 DIAGNOSIS — I482 Chronic atrial fibrillation, unspecified: Secondary | ICD-10-CM | POA: Diagnosis not present

## 2023-06-06 DIAGNOSIS — M51361 Other intervertebral disc degeneration, lumbar region with lower extremity pain only: Secondary | ICD-10-CM | POA: Diagnosis not present

## 2023-06-06 DIAGNOSIS — R278 Other lack of coordination: Secondary | ICD-10-CM | POA: Diagnosis not present

## 2023-06-06 DIAGNOSIS — M6281 Muscle weakness (generalized): Secondary | ICD-10-CM | POA: Diagnosis not present

## 2023-06-06 DIAGNOSIS — R2689 Other abnormalities of gait and mobility: Secondary | ICD-10-CM | POA: Diagnosis not present

## 2023-06-09 DIAGNOSIS — I482 Chronic atrial fibrillation, unspecified: Secondary | ICD-10-CM | POA: Diagnosis not present

## 2023-06-09 DIAGNOSIS — L22 Diaper dermatitis: Secondary | ICD-10-CM | POA: Diagnosis not present

## 2023-06-09 DIAGNOSIS — M51361 Other intervertebral disc degeneration, lumbar region with lower extremity pain only: Secondary | ICD-10-CM | POA: Diagnosis not present

## 2023-06-09 DIAGNOSIS — G459 Transient cerebral ischemic attack, unspecified: Secondary | ICD-10-CM | POA: Diagnosis not present

## 2023-06-09 DIAGNOSIS — E785 Hyperlipidemia, unspecified: Secondary | ICD-10-CM | POA: Diagnosis not present

## 2023-06-09 DIAGNOSIS — R278 Other lack of coordination: Secondary | ICD-10-CM | POA: Diagnosis not present

## 2023-06-09 DIAGNOSIS — R2689 Other abnormalities of gait and mobility: Secondary | ICD-10-CM | POA: Diagnosis not present

## 2023-06-09 DIAGNOSIS — M6281 Muscle weakness (generalized): Secondary | ICD-10-CM | POA: Diagnosis not present

## 2023-06-10 DIAGNOSIS — R278 Other lack of coordination: Secondary | ICD-10-CM | POA: Diagnosis not present

## 2023-06-10 DIAGNOSIS — I482 Chronic atrial fibrillation, unspecified: Secondary | ICD-10-CM | POA: Diagnosis not present

## 2023-06-10 DIAGNOSIS — M51361 Other intervertebral disc degeneration, lumbar region with lower extremity pain only: Secondary | ICD-10-CM | POA: Diagnosis not present

## 2023-06-10 DIAGNOSIS — M6281 Muscle weakness (generalized): Secondary | ICD-10-CM | POA: Diagnosis not present

## 2023-06-10 DIAGNOSIS — R2689 Other abnormalities of gait and mobility: Secondary | ICD-10-CM | POA: Diagnosis not present

## 2023-06-11 DIAGNOSIS — M6281 Muscle weakness (generalized): Secondary | ICD-10-CM | POA: Diagnosis not present

## 2023-06-11 DIAGNOSIS — M51361 Other intervertebral disc degeneration, lumbar region with lower extremity pain only: Secondary | ICD-10-CM | POA: Diagnosis not present

## 2023-06-11 DIAGNOSIS — R2689 Other abnormalities of gait and mobility: Secondary | ICD-10-CM | POA: Diagnosis not present

## 2023-06-11 DIAGNOSIS — M19071 Primary osteoarthritis, right ankle and foot: Secondary | ICD-10-CM | POA: Diagnosis not present

## 2023-06-11 DIAGNOSIS — I482 Chronic atrial fibrillation, unspecified: Secondary | ICD-10-CM | POA: Diagnosis not present

## 2023-06-11 DIAGNOSIS — R278 Other lack of coordination: Secondary | ICD-10-CM | POA: Diagnosis not present

## 2023-06-12 DIAGNOSIS — I10 Essential (primary) hypertension: Secondary | ICD-10-CM | POA: Diagnosis not present

## 2023-06-12 DIAGNOSIS — G459 Transient cerebral ischemic attack, unspecified: Secondary | ICD-10-CM | POA: Diagnosis not present

## 2023-06-12 DIAGNOSIS — R278 Other lack of coordination: Secondary | ICD-10-CM | POA: Diagnosis not present

## 2023-06-12 DIAGNOSIS — M51361 Other intervertebral disc degeneration, lumbar region with lower extremity pain only: Secondary | ICD-10-CM | POA: Diagnosis not present

## 2023-06-12 DIAGNOSIS — I4891 Unspecified atrial fibrillation: Secondary | ICD-10-CM | POA: Diagnosis not present

## 2023-06-12 DIAGNOSIS — I482 Chronic atrial fibrillation, unspecified: Secondary | ICD-10-CM | POA: Diagnosis not present

## 2023-06-12 DIAGNOSIS — R2689 Other abnormalities of gait and mobility: Secondary | ICD-10-CM | POA: Diagnosis not present

## 2023-06-12 DIAGNOSIS — D649 Anemia, unspecified: Secondary | ICD-10-CM | POA: Diagnosis not present

## 2023-06-12 DIAGNOSIS — E782 Mixed hyperlipidemia: Secondary | ICD-10-CM | POA: Diagnosis not present

## 2023-06-12 DIAGNOSIS — M6281 Muscle weakness (generalized): Secondary | ICD-10-CM | POA: Diagnosis not present

## 2023-06-12 DIAGNOSIS — R600 Localized edema: Secondary | ICD-10-CM | POA: Diagnosis not present

## 2023-06-13 DIAGNOSIS — M6281 Muscle weakness (generalized): Secondary | ICD-10-CM | POA: Diagnosis not present

## 2023-06-13 DIAGNOSIS — I482 Chronic atrial fibrillation, unspecified: Secondary | ICD-10-CM | POA: Diagnosis not present

## 2023-06-13 DIAGNOSIS — M51361 Other intervertebral disc degeneration, lumbar region with lower extremity pain only: Secondary | ICD-10-CM | POA: Diagnosis not present

## 2023-06-13 DIAGNOSIS — R278 Other lack of coordination: Secondary | ICD-10-CM | POA: Diagnosis not present

## 2023-06-13 DIAGNOSIS — R2689 Other abnormalities of gait and mobility: Secondary | ICD-10-CM | POA: Diagnosis not present

## 2023-06-16 DIAGNOSIS — R2689 Other abnormalities of gait and mobility: Secondary | ICD-10-CM | POA: Diagnosis not present

## 2023-06-16 DIAGNOSIS — M51361 Other intervertebral disc degeneration, lumbar region with lower extremity pain only: Secondary | ICD-10-CM | POA: Diagnosis not present

## 2023-06-16 DIAGNOSIS — R278 Other lack of coordination: Secondary | ICD-10-CM | POA: Diagnosis not present

## 2023-06-16 DIAGNOSIS — I482 Chronic atrial fibrillation, unspecified: Secondary | ICD-10-CM | POA: Diagnosis not present

## 2023-06-16 DIAGNOSIS — M6281 Muscle weakness (generalized): Secondary | ICD-10-CM | POA: Diagnosis not present

## 2023-06-17 DIAGNOSIS — M51361 Other intervertebral disc degeneration, lumbar region with lower extremity pain only: Secondary | ICD-10-CM | POA: Diagnosis not present

## 2023-06-17 DIAGNOSIS — I482 Chronic atrial fibrillation, unspecified: Secondary | ICD-10-CM | POA: Diagnosis not present

## 2023-06-17 DIAGNOSIS — R278 Other lack of coordination: Secondary | ICD-10-CM | POA: Diagnosis not present

## 2023-06-17 DIAGNOSIS — R2689 Other abnormalities of gait and mobility: Secondary | ICD-10-CM | POA: Diagnosis not present

## 2023-06-17 DIAGNOSIS — M6281 Muscle weakness (generalized): Secondary | ICD-10-CM | POA: Diagnosis not present

## 2023-06-18 DIAGNOSIS — M6281 Muscle weakness (generalized): Secondary | ICD-10-CM | POA: Diagnosis not present

## 2023-06-18 DIAGNOSIS — M51361 Other intervertebral disc degeneration, lumbar region with lower extremity pain only: Secondary | ICD-10-CM | POA: Diagnosis not present

## 2023-06-18 DIAGNOSIS — I482 Chronic atrial fibrillation, unspecified: Secondary | ICD-10-CM | POA: Diagnosis not present

## 2023-06-18 DIAGNOSIS — R278 Other lack of coordination: Secondary | ICD-10-CM | POA: Diagnosis not present

## 2023-06-18 DIAGNOSIS — R2689 Other abnormalities of gait and mobility: Secondary | ICD-10-CM | POA: Diagnosis not present

## 2023-06-19 DIAGNOSIS — M6281 Muscle weakness (generalized): Secondary | ICD-10-CM | POA: Diagnosis not present

## 2023-06-19 DIAGNOSIS — M51361 Other intervertebral disc degeneration, lumbar region with lower extremity pain only: Secondary | ICD-10-CM | POA: Diagnosis not present

## 2023-06-19 DIAGNOSIS — I482 Chronic atrial fibrillation, unspecified: Secondary | ICD-10-CM | POA: Diagnosis not present

## 2023-06-19 DIAGNOSIS — R2689 Other abnormalities of gait and mobility: Secondary | ICD-10-CM | POA: Diagnosis not present

## 2023-06-19 DIAGNOSIS — R278 Other lack of coordination: Secondary | ICD-10-CM | POA: Diagnosis not present

## 2023-06-20 DIAGNOSIS — M6281 Muscle weakness (generalized): Secondary | ICD-10-CM | POA: Diagnosis not present

## 2023-06-20 DIAGNOSIS — R2689 Other abnormalities of gait and mobility: Secondary | ICD-10-CM | POA: Diagnosis not present

## 2023-06-20 DIAGNOSIS — M51361 Other intervertebral disc degeneration, lumbar region with lower extremity pain only: Secondary | ICD-10-CM | POA: Diagnosis not present

## 2023-06-20 DIAGNOSIS — I482 Chronic atrial fibrillation, unspecified: Secondary | ICD-10-CM | POA: Diagnosis not present

## 2023-06-20 DIAGNOSIS — R278 Other lack of coordination: Secondary | ICD-10-CM | POA: Diagnosis not present

## 2023-06-22 DIAGNOSIS — I482 Chronic atrial fibrillation, unspecified: Secondary | ICD-10-CM | POA: Diagnosis not present

## 2023-06-22 DIAGNOSIS — M6281 Muscle weakness (generalized): Secondary | ICD-10-CM | POA: Diagnosis not present

## 2023-06-22 DIAGNOSIS — R2689 Other abnormalities of gait and mobility: Secondary | ICD-10-CM | POA: Diagnosis not present

## 2023-06-22 DIAGNOSIS — R278 Other lack of coordination: Secondary | ICD-10-CM | POA: Diagnosis not present

## 2023-06-22 DIAGNOSIS — M51361 Other intervertebral disc degeneration, lumbar region with lower extremity pain only: Secondary | ICD-10-CM | POA: Diagnosis not present

## 2023-06-23 DIAGNOSIS — M6281 Muscle weakness (generalized): Secondary | ICD-10-CM | POA: Diagnosis not present

## 2023-06-23 DIAGNOSIS — M51361 Other intervertebral disc degeneration, lumbar region with lower extremity pain only: Secondary | ICD-10-CM | POA: Diagnosis not present

## 2023-06-23 DIAGNOSIS — I482 Chronic atrial fibrillation, unspecified: Secondary | ICD-10-CM | POA: Diagnosis not present

## 2023-06-23 DIAGNOSIS — R278 Other lack of coordination: Secondary | ICD-10-CM | POA: Diagnosis not present

## 2023-06-23 DIAGNOSIS — R2689 Other abnormalities of gait and mobility: Secondary | ICD-10-CM | POA: Diagnosis not present

## 2023-06-24 DIAGNOSIS — R29898 Other symptoms and signs involving the musculoskeletal system: Secondary | ICD-10-CM | POA: Diagnosis not present

## 2023-06-24 DIAGNOSIS — Z952 Presence of prosthetic heart valve: Secondary | ICD-10-CM | POA: Diagnosis not present

## 2023-06-24 DIAGNOSIS — I25810 Atherosclerosis of coronary artery bypass graft(s) without angina pectoris: Secondary | ICD-10-CM | POA: Diagnosis not present

## 2023-06-24 DIAGNOSIS — N1832 Chronic kidney disease, stage 3b: Secondary | ICD-10-CM | POA: Diagnosis not present

## 2023-06-24 DIAGNOSIS — R600 Localized edema: Secondary | ICD-10-CM | POA: Diagnosis not present

## 2023-06-24 DIAGNOSIS — I482 Chronic atrial fibrillation, unspecified: Secondary | ICD-10-CM | POA: Diagnosis not present

## 2023-06-24 DIAGNOSIS — M6281 Muscle weakness (generalized): Secondary | ICD-10-CM | POA: Diagnosis not present

## 2023-06-24 DIAGNOSIS — R2689 Other abnormalities of gait and mobility: Secondary | ICD-10-CM | POA: Diagnosis not present

## 2023-06-24 DIAGNOSIS — I4891 Unspecified atrial fibrillation: Secondary | ICD-10-CM | POA: Diagnosis not present

## 2023-06-24 DIAGNOSIS — D692 Other nonthrombocytopenic purpura: Secondary | ICD-10-CM | POA: Diagnosis not present

## 2023-06-24 DIAGNOSIS — M51361 Other intervertebral disc degeneration, lumbar region with lower extremity pain only: Secondary | ICD-10-CM | POA: Diagnosis not present

## 2023-06-24 DIAGNOSIS — I129 Hypertensive chronic kidney disease with stage 1 through stage 4 chronic kidney disease, or unspecified chronic kidney disease: Secondary | ICD-10-CM | POA: Diagnosis not present

## 2023-06-24 DIAGNOSIS — I350 Nonrheumatic aortic (valve) stenosis: Secondary | ICD-10-CM | POA: Diagnosis not present

## 2023-06-24 DIAGNOSIS — D6869 Other thrombophilia: Secondary | ICD-10-CM | POA: Diagnosis not present

## 2023-06-24 DIAGNOSIS — E1122 Type 2 diabetes mellitus with diabetic chronic kidney disease: Secondary | ICD-10-CM | POA: Diagnosis not present

## 2023-06-24 DIAGNOSIS — E785 Hyperlipidemia, unspecified: Secondary | ICD-10-CM | POA: Diagnosis not present

## 2023-06-24 DIAGNOSIS — R278 Other lack of coordination: Secondary | ICD-10-CM | POA: Diagnosis not present

## 2023-06-25 DIAGNOSIS — M6281 Muscle weakness (generalized): Secondary | ICD-10-CM | POA: Diagnosis not present

## 2023-06-25 DIAGNOSIS — R278 Other lack of coordination: Secondary | ICD-10-CM | POA: Diagnosis not present

## 2023-06-25 DIAGNOSIS — I482 Chronic atrial fibrillation, unspecified: Secondary | ICD-10-CM | POA: Diagnosis not present

## 2023-06-25 DIAGNOSIS — R2689 Other abnormalities of gait and mobility: Secondary | ICD-10-CM | POA: Diagnosis not present

## 2023-06-25 DIAGNOSIS — M51361 Other intervertebral disc degeneration, lumbar region with lower extremity pain only: Secondary | ICD-10-CM | POA: Diagnosis not present

## 2023-06-26 DIAGNOSIS — R2689 Other abnormalities of gait and mobility: Secondary | ICD-10-CM | POA: Diagnosis not present

## 2023-06-26 DIAGNOSIS — M51361 Other intervertebral disc degeneration, lumbar region with lower extremity pain only: Secondary | ICD-10-CM | POA: Diagnosis not present

## 2023-06-26 DIAGNOSIS — R278 Other lack of coordination: Secondary | ICD-10-CM | POA: Diagnosis not present

## 2023-06-26 DIAGNOSIS — M6281 Muscle weakness (generalized): Secondary | ICD-10-CM | POA: Diagnosis not present

## 2023-06-26 DIAGNOSIS — I482 Chronic atrial fibrillation, unspecified: Secondary | ICD-10-CM | POA: Diagnosis not present

## 2023-06-30 DIAGNOSIS — I482 Chronic atrial fibrillation, unspecified: Secondary | ICD-10-CM | POA: Diagnosis not present

## 2023-06-30 DIAGNOSIS — M6281 Muscle weakness (generalized): Secondary | ICD-10-CM | POA: Diagnosis not present

## 2023-06-30 DIAGNOSIS — R2689 Other abnormalities of gait and mobility: Secondary | ICD-10-CM | POA: Diagnosis not present

## 2023-06-30 DIAGNOSIS — R278 Other lack of coordination: Secondary | ICD-10-CM | POA: Diagnosis not present

## 2023-06-30 DIAGNOSIS — M51361 Other intervertebral disc degeneration, lumbar region with lower extremity pain only: Secondary | ICD-10-CM | POA: Diagnosis not present

## 2023-07-01 DIAGNOSIS — R2689 Other abnormalities of gait and mobility: Secondary | ICD-10-CM | POA: Diagnosis not present

## 2023-07-01 DIAGNOSIS — I482 Chronic atrial fibrillation, unspecified: Secondary | ICD-10-CM | POA: Diagnosis not present

## 2023-07-01 DIAGNOSIS — M6281 Muscle weakness (generalized): Secondary | ICD-10-CM | POA: Diagnosis not present

## 2023-07-01 DIAGNOSIS — M51361 Other intervertebral disc degeneration, lumbar region with lower extremity pain only: Secondary | ICD-10-CM | POA: Diagnosis not present

## 2023-07-01 DIAGNOSIS — R278 Other lack of coordination: Secondary | ICD-10-CM | POA: Diagnosis not present

## 2023-07-02 DIAGNOSIS — I482 Chronic atrial fibrillation, unspecified: Secondary | ICD-10-CM | POA: Diagnosis not present

## 2023-07-02 DIAGNOSIS — R278 Other lack of coordination: Secondary | ICD-10-CM | POA: Diagnosis not present

## 2023-07-02 DIAGNOSIS — R2689 Other abnormalities of gait and mobility: Secondary | ICD-10-CM | POA: Diagnosis not present

## 2023-07-02 DIAGNOSIS — M6281 Muscle weakness (generalized): Secondary | ICD-10-CM | POA: Diagnosis not present

## 2023-07-02 DIAGNOSIS — M51361 Other intervertebral disc degeneration, lumbar region with lower extremity pain only: Secondary | ICD-10-CM | POA: Diagnosis not present

## 2023-07-03 DIAGNOSIS — R278 Other lack of coordination: Secondary | ICD-10-CM | POA: Diagnosis not present

## 2023-07-03 DIAGNOSIS — M51361 Other intervertebral disc degeneration, lumbar region with lower extremity pain only: Secondary | ICD-10-CM | POA: Diagnosis not present

## 2023-07-03 DIAGNOSIS — M6281 Muscle weakness (generalized): Secondary | ICD-10-CM | POA: Diagnosis not present

## 2023-07-03 DIAGNOSIS — I482 Chronic atrial fibrillation, unspecified: Secondary | ICD-10-CM | POA: Diagnosis not present

## 2023-07-03 DIAGNOSIS — R2689 Other abnormalities of gait and mobility: Secondary | ICD-10-CM | POA: Diagnosis not present

## 2023-07-04 DIAGNOSIS — R2689 Other abnormalities of gait and mobility: Secondary | ICD-10-CM | POA: Diagnosis not present

## 2023-07-04 DIAGNOSIS — M6281 Muscle weakness (generalized): Secondary | ICD-10-CM | POA: Diagnosis not present

## 2023-07-04 DIAGNOSIS — I482 Chronic atrial fibrillation, unspecified: Secondary | ICD-10-CM | POA: Diagnosis not present

## 2023-07-04 DIAGNOSIS — R278 Other lack of coordination: Secondary | ICD-10-CM | POA: Diagnosis not present

## 2023-07-04 DIAGNOSIS — M51361 Other intervertebral disc degeneration, lumbar region with lower extremity pain only: Secondary | ICD-10-CM | POA: Diagnosis not present

## 2023-07-08 DIAGNOSIS — M6281 Muscle weakness (generalized): Secondary | ICD-10-CM | POA: Diagnosis not present

## 2023-07-08 DIAGNOSIS — I482 Chronic atrial fibrillation, unspecified: Secondary | ICD-10-CM | POA: Diagnosis not present

## 2023-07-08 DIAGNOSIS — R278 Other lack of coordination: Secondary | ICD-10-CM | POA: Diagnosis not present

## 2023-07-08 DIAGNOSIS — M51361 Other intervertebral disc degeneration, lumbar region with lower extremity pain only: Secondary | ICD-10-CM | POA: Diagnosis not present

## 2023-07-08 DIAGNOSIS — R2689 Other abnormalities of gait and mobility: Secondary | ICD-10-CM | POA: Diagnosis not present

## 2023-07-09 DIAGNOSIS — I482 Chronic atrial fibrillation, unspecified: Secondary | ICD-10-CM | POA: Diagnosis not present

## 2023-07-09 DIAGNOSIS — M6281 Muscle weakness (generalized): Secondary | ICD-10-CM | POA: Diagnosis not present

## 2023-07-09 DIAGNOSIS — R2689 Other abnormalities of gait and mobility: Secondary | ICD-10-CM | POA: Diagnosis not present

## 2023-07-09 DIAGNOSIS — M51361 Other intervertebral disc degeneration, lumbar region with lower extremity pain only: Secondary | ICD-10-CM | POA: Diagnosis not present

## 2023-07-09 DIAGNOSIS — R278 Other lack of coordination: Secondary | ICD-10-CM | POA: Diagnosis not present

## 2023-07-10 DIAGNOSIS — I482 Chronic atrial fibrillation, unspecified: Secondary | ICD-10-CM | POA: Diagnosis not present

## 2023-07-10 DIAGNOSIS — M51361 Other intervertebral disc degeneration, lumbar region with lower extremity pain only: Secondary | ICD-10-CM | POA: Diagnosis not present

## 2023-07-10 DIAGNOSIS — M6281 Muscle weakness (generalized): Secondary | ICD-10-CM | POA: Diagnosis not present

## 2023-07-10 DIAGNOSIS — R2689 Other abnormalities of gait and mobility: Secondary | ICD-10-CM | POA: Diagnosis not present

## 2023-07-10 DIAGNOSIS — R278 Other lack of coordination: Secondary | ICD-10-CM | POA: Diagnosis not present

## 2023-07-11 DIAGNOSIS — I482 Chronic atrial fibrillation, unspecified: Secondary | ICD-10-CM | POA: Diagnosis not present

## 2023-07-11 DIAGNOSIS — R2689 Other abnormalities of gait and mobility: Secondary | ICD-10-CM | POA: Diagnosis not present

## 2023-07-11 DIAGNOSIS — R278 Other lack of coordination: Secondary | ICD-10-CM | POA: Diagnosis not present

## 2023-07-11 DIAGNOSIS — M51361 Other intervertebral disc degeneration, lumbar region with lower extremity pain only: Secondary | ICD-10-CM | POA: Diagnosis not present

## 2023-07-11 DIAGNOSIS — M6281 Muscle weakness (generalized): Secondary | ICD-10-CM | POA: Diagnosis not present

## 2023-07-14 DIAGNOSIS — M6281 Muscle weakness (generalized): Secondary | ICD-10-CM | POA: Diagnosis not present

## 2023-07-14 DIAGNOSIS — R278 Other lack of coordination: Secondary | ICD-10-CM | POA: Diagnosis not present

## 2023-07-14 DIAGNOSIS — M51361 Other intervertebral disc degeneration, lumbar region with lower extremity pain only: Secondary | ICD-10-CM | POA: Diagnosis not present

## 2023-07-14 DIAGNOSIS — R2689 Other abnormalities of gait and mobility: Secondary | ICD-10-CM | POA: Diagnosis not present

## 2023-07-14 DIAGNOSIS — I482 Chronic atrial fibrillation, unspecified: Secondary | ICD-10-CM | POA: Diagnosis not present

## 2023-07-15 DIAGNOSIS — R2689 Other abnormalities of gait and mobility: Secondary | ICD-10-CM | POA: Diagnosis not present

## 2023-07-15 DIAGNOSIS — I482 Chronic atrial fibrillation, unspecified: Secondary | ICD-10-CM | POA: Diagnosis not present

## 2023-07-15 DIAGNOSIS — M51361 Other intervertebral disc degeneration, lumbar region with lower extremity pain only: Secondary | ICD-10-CM | POA: Diagnosis not present

## 2023-07-15 DIAGNOSIS — M6281 Muscle weakness (generalized): Secondary | ICD-10-CM | POA: Diagnosis not present

## 2023-07-15 DIAGNOSIS — R278 Other lack of coordination: Secondary | ICD-10-CM | POA: Diagnosis not present

## 2023-07-16 DIAGNOSIS — R2689 Other abnormalities of gait and mobility: Secondary | ICD-10-CM | POA: Diagnosis not present

## 2023-07-16 DIAGNOSIS — M51361 Other intervertebral disc degeneration, lumbar region with lower extremity pain only: Secondary | ICD-10-CM | POA: Diagnosis not present

## 2023-07-16 DIAGNOSIS — R278 Other lack of coordination: Secondary | ICD-10-CM | POA: Diagnosis not present

## 2023-07-16 DIAGNOSIS — M6281 Muscle weakness (generalized): Secondary | ICD-10-CM | POA: Diagnosis not present

## 2023-07-16 DIAGNOSIS — I482 Chronic atrial fibrillation, unspecified: Secondary | ICD-10-CM | POA: Diagnosis not present

## 2023-07-17 DIAGNOSIS — E119 Type 2 diabetes mellitus without complications: Secondary | ICD-10-CM | POA: Diagnosis not present

## 2023-07-17 DIAGNOSIS — M51361 Other intervertebral disc degeneration, lumbar region with lower extremity pain only: Secondary | ICD-10-CM | POA: Diagnosis not present

## 2023-07-17 DIAGNOSIS — R2689 Other abnormalities of gait and mobility: Secondary | ICD-10-CM | POA: Diagnosis not present

## 2023-07-17 DIAGNOSIS — I482 Chronic atrial fibrillation, unspecified: Secondary | ICD-10-CM | POA: Diagnosis not present

## 2023-07-17 DIAGNOSIS — R278 Other lack of coordination: Secondary | ICD-10-CM | POA: Diagnosis not present

## 2023-07-17 DIAGNOSIS — M6281 Muscle weakness (generalized): Secondary | ICD-10-CM | POA: Diagnosis not present

## 2023-07-18 DIAGNOSIS — I482 Chronic atrial fibrillation, unspecified: Secondary | ICD-10-CM | POA: Diagnosis not present

## 2023-07-18 DIAGNOSIS — R2689 Other abnormalities of gait and mobility: Secondary | ICD-10-CM | POA: Diagnosis not present

## 2023-07-18 DIAGNOSIS — M51361 Other intervertebral disc degeneration, lumbar region with lower extremity pain only: Secondary | ICD-10-CM | POA: Diagnosis not present

## 2023-07-18 DIAGNOSIS — R278 Other lack of coordination: Secondary | ICD-10-CM | POA: Diagnosis not present

## 2023-07-18 DIAGNOSIS — M6281 Muscle weakness (generalized): Secondary | ICD-10-CM | POA: Diagnosis not present

## 2023-08-05 DIAGNOSIS — I10 Essential (primary) hypertension: Secondary | ICD-10-CM | POA: Diagnosis not present

## 2023-08-05 DIAGNOSIS — E119 Type 2 diabetes mellitus without complications: Secondary | ICD-10-CM | POA: Diagnosis not present

## 2023-09-19 DIAGNOSIS — E1169 Type 2 diabetes mellitus with other specified complication: Secondary | ICD-10-CM | POA: Diagnosis not present

## 2023-09-19 DIAGNOSIS — R278 Other lack of coordination: Secondary | ICD-10-CM | POA: Diagnosis not present

## 2023-09-19 DIAGNOSIS — R2689 Other abnormalities of gait and mobility: Secondary | ICD-10-CM | POA: Diagnosis not present

## 2023-09-19 DIAGNOSIS — R600 Localized edema: Secondary | ICD-10-CM | POA: Diagnosis not present

## 2023-09-19 DIAGNOSIS — I4891 Unspecified atrial fibrillation: Secondary | ICD-10-CM | POA: Diagnosis not present

## 2023-09-19 DIAGNOSIS — M6281 Muscle weakness (generalized): Secondary | ICD-10-CM | POA: Diagnosis not present

## 2023-10-10 DIAGNOSIS — Z4689 Encounter for fitting and adjustment of other specified devices: Secondary | ICD-10-CM | POA: Diagnosis not present

## 2023-10-10 DIAGNOSIS — R601 Generalized edema: Secondary | ICD-10-CM | POA: Diagnosis not present

## 2023-10-20 DIAGNOSIS — H6121 Impacted cerumen, right ear: Secondary | ICD-10-CM | POA: Diagnosis not present

## 2023-10-20 DIAGNOSIS — H9201 Otalgia, right ear: Secondary | ICD-10-CM | POA: Diagnosis not present

## 2023-11-10 DIAGNOSIS — E119 Type 2 diabetes mellitus without complications: Secondary | ICD-10-CM | POA: Diagnosis not present

## 2023-11-10 DIAGNOSIS — I2510 Atherosclerotic heart disease of native coronary artery without angina pectoris: Secondary | ICD-10-CM | POA: Diagnosis not present

## 2023-11-10 DIAGNOSIS — R069 Unspecified abnormalities of breathing: Secondary | ICD-10-CM | POA: Diagnosis not present

## 2023-11-10 DIAGNOSIS — R079 Chest pain, unspecified: Secondary | ICD-10-CM | POA: Diagnosis not present

## 2023-11-10 DIAGNOSIS — J189 Pneumonia, unspecified organism: Secondary | ICD-10-CM | POA: Diagnosis not present

## 2023-11-10 DIAGNOSIS — E1122 Type 2 diabetes mellitus with diabetic chronic kidney disease: Secondary | ICD-10-CM | POA: Diagnosis not present

## 2023-11-10 DIAGNOSIS — R7989 Other specified abnormal findings of blood chemistry: Secondary | ICD-10-CM | POA: Diagnosis not present

## 2023-11-10 DIAGNOSIS — Z7984 Long term (current) use of oral hypoglycemic drugs: Secondary | ICD-10-CM | POA: Diagnosis not present

## 2023-11-10 DIAGNOSIS — Z6834 Body mass index (BMI) 34.0-34.9, adult: Secondary | ICD-10-CM | POA: Diagnosis not present

## 2023-11-10 DIAGNOSIS — Z953 Presence of xenogenic heart valve: Secondary | ICD-10-CM | POA: Diagnosis not present

## 2023-11-10 DIAGNOSIS — I959 Hypotension, unspecified: Secondary | ICD-10-CM | POA: Diagnosis not present

## 2023-11-10 DIAGNOSIS — J9601 Acute respiratory failure with hypoxia: Secondary | ICD-10-CM | POA: Diagnosis not present

## 2023-11-10 DIAGNOSIS — Z951 Presence of aortocoronary bypass graft: Secondary | ICD-10-CM | POA: Diagnosis not present

## 2023-11-10 DIAGNOSIS — I25110 Atherosclerotic heart disease of native coronary artery with unstable angina pectoris: Secondary | ICD-10-CM | POA: Diagnosis not present

## 2023-11-10 DIAGNOSIS — M7989 Other specified soft tissue disorders: Secondary | ICD-10-CM | POA: Diagnosis not present

## 2023-11-10 DIAGNOSIS — R0689 Other abnormalities of breathing: Secondary | ICD-10-CM | POA: Diagnosis not present

## 2023-11-10 DIAGNOSIS — R54 Age-related physical debility: Secondary | ICD-10-CM | POA: Diagnosis not present

## 2023-11-10 DIAGNOSIS — I447 Left bundle-branch block, unspecified: Secondary | ICD-10-CM | POA: Diagnosis not present

## 2023-11-10 DIAGNOSIS — E669 Obesity, unspecified: Secondary | ICD-10-CM | POA: Diagnosis not present

## 2023-11-10 DIAGNOSIS — R609 Edema, unspecified: Secondary | ICD-10-CM | POA: Diagnosis not present

## 2023-11-10 DIAGNOSIS — I130 Hypertensive heart and chronic kidney disease with heart failure and stage 1 through stage 4 chronic kidney disease, or unspecified chronic kidney disease: Secondary | ICD-10-CM | POA: Diagnosis not present

## 2023-11-10 DIAGNOSIS — I214 Non-ST elevation (NSTEMI) myocardial infarction: Secondary | ICD-10-CM | POA: Diagnosis not present

## 2023-11-10 DIAGNOSIS — G4733 Obstructive sleep apnea (adult) (pediatric): Secondary | ICD-10-CM | POA: Diagnosis not present

## 2023-11-10 DIAGNOSIS — N1832 Chronic kidney disease, stage 3b: Secondary | ICD-10-CM | POA: Diagnosis not present

## 2023-11-10 DIAGNOSIS — L0390 Cellulitis, unspecified: Secondary | ICD-10-CM | POA: Diagnosis not present

## 2023-11-10 DIAGNOSIS — E785 Hyperlipidemia, unspecified: Secondary | ICD-10-CM | POA: Diagnosis not present

## 2023-11-10 DIAGNOSIS — I25118 Atherosclerotic heart disease of native coronary artery with other forms of angina pectoris: Secondary | ICD-10-CM | POA: Diagnosis not present

## 2023-11-10 DIAGNOSIS — I5033 Acute on chronic diastolic (congestive) heart failure: Secondary | ICD-10-CM | POA: Diagnosis not present

## 2023-11-10 DIAGNOSIS — R531 Weakness: Secondary | ICD-10-CM | POA: Diagnosis not present

## 2023-11-10 DIAGNOSIS — I081 Rheumatic disorders of both mitral and tricuspid valves: Secondary | ICD-10-CM | POA: Diagnosis not present

## 2023-11-10 DIAGNOSIS — J449 Chronic obstructive pulmonary disease, unspecified: Secondary | ICD-10-CM | POA: Diagnosis not present

## 2023-11-10 DIAGNOSIS — Z7951 Long term (current) use of inhaled steroids: Secondary | ICD-10-CM | POA: Diagnosis not present

## 2023-11-10 DIAGNOSIS — I272 Pulmonary hypertension, unspecified: Secondary | ICD-10-CM | POA: Diagnosis not present

## 2023-11-10 DIAGNOSIS — R0989 Other specified symptoms and signs involving the circulatory and respiratory systems: Secondary | ICD-10-CM | POA: Diagnosis not present

## 2023-11-10 DIAGNOSIS — L03115 Cellulitis of right lower limb: Secondary | ICD-10-CM | POA: Diagnosis not present

## 2023-11-10 DIAGNOSIS — I4821 Permanent atrial fibrillation: Secondary | ICD-10-CM | POA: Diagnosis not present

## 2023-11-10 DIAGNOSIS — D631 Anemia in chronic kidney disease: Secondary | ICD-10-CM | POA: Diagnosis not present

## 2023-11-10 DIAGNOSIS — Z79899 Other long term (current) drug therapy: Secondary | ICD-10-CM | POA: Diagnosis not present

## 2023-11-10 DIAGNOSIS — I10 Essential (primary) hypertension: Secondary | ICD-10-CM | POA: Diagnosis not present

## 2023-11-10 DIAGNOSIS — Z952 Presence of prosthetic heart valve: Secondary | ICD-10-CM | POA: Diagnosis not present

## 2023-11-10 DIAGNOSIS — Z7901 Long term (current) use of anticoagulants: Secondary | ICD-10-CM | POA: Diagnosis not present

## 2023-11-10 DIAGNOSIS — I493 Ventricular premature depolarization: Secondary | ICD-10-CM | POA: Diagnosis not present

## 2023-11-10 DIAGNOSIS — R55 Syncope and collapse: Secondary | ICD-10-CM | POA: Diagnosis not present

## 2023-11-12 NOTE — Discharge Summary (Signed)
 ------------------------------------------------------------------------------- Attestation signed by Lori JAYSON Lever, MD at 11/17/23 [id number] Cardiology Attending  Patient seen, examined and discussed with advanced practice provider. I provided the substantive portion of the medical decision making on this patient for today's encounter. Treatment recommendations were made collaboratively with the APP under my direction.   89 year old female with a history of hypertension and dyslipidemia, as well as CAD status post prior four-vessel bypass, aortic stenosis status post TAVR in 2022, permanent atrial fibrillation, PVCs, untreated OSA, and carotid stenosis.  She also has diabetes type 2 and CKD 3B.  She presented to the ED after experiencing syncope.  On presentation her troponin was elevated on second set and chest x-ray with mild pulmonary edema.  She was admitted to cardiology service.  Echo demonstrated EF 47%, normally functioning TAVR, mild pulm hypertension.  She had been experiencing back pain that was similar to but definitely not congruent to index angina therefore SPECT pursued, demonstrated normal perfusion.  She was monitored on telemetry with no arrhythmia other than her known atrial fibrillation.  She was treated with antibiotics for possible cellulitis.    Syncope thought possibly vagal.  Given reassuring testing and clinical stability during her hospitalization it was thought safe for her to be discharged home to follow-up with [facility] cardiology.   Vitals:   11/12/23 0515 11/12/23 0812 11/12/23 1102 11/12/23 1124  BP:  145/74  (!) 121/56  BP Location:  Right Upper Arm  Right Upper Arm  Patient Position:  Lying  Lying  Pulse:  79 71 77  Resp:  18  18  Temp:  98.8 F (37.1 C)  99.4 F (37.4 C)  TempSrc:  Oral  Oral  SpO2:  94%  90%  Weight: 220 lb 6.4 oz (100 kg)     Height:        Electronically Signed by: Lori Price, M.D., [facility] (Cardiology) 11/12/2023 2:59 PM   -------------------------------------------------------------------------------   [facility] ([facility])- [facility] 11/12/2023   Patient Name:  Lori Price  (MRN: [id number])    DOB: November 15, 1931     Primary Care Provider: [name], MD   Primary Cardiologist: No primary care provider on file.   Primary Electrophysiologist:    Discharge Summary  Date of Admission: 11/10/2023  Date of Discharge: 11/12/2023   Disposition To home with family in stable condition.  Primary Diagnosis Pre-/frank syncope - Presumed vagal, she has had no rhythm abnormalities during her hospitalization here other than her known chronic A-fib - Can consider cardiac monitor with patient's primary cardiologist and follow-up if further episodes persist - Otherwise precautionary education was provided for vagal events  Secondary Diagnoses  1.  Elevated troponin 2.  CAD status post CABG 3.  AAS status post TAVR 4.  Permanent atrial fibrillation 5.  Left bundle branch block 6.  Cellulitis 7.  COPD 8.  Type 2 diabetes 9.  Obesity 10.  Hyperlipidemia  Consults IP CONSULT TO HOSPITALIST IP CONSULT TO CASE MANAGEMENT, RN/SW  Procedures ECHO 1. Normal left ventricular cavity size. Mildly depressed left ventricular  systolic function. LV Ejection Fraction is approximately: 47 %. 2. Abnormal diastolic function. 3. Normal right ventricular size. Mild right ventricular hypokinesis. 4. Mildly dilated left atrium. 5. There is trivial mitral regurgitation seen. Mild mitral annular  calcification. Mild mitral stenosis. 6. A bioprosthetic valve is present in the aortic position, the prosthesis is  well seated and is functioning normally. 7. Estimated pulmonary artery systolic  pressure is consistent with mild  pulmonary hypertension (35-25mmHg).  SPECT     CONCLUSIONS: 1. No significant symptomatic response to stress. The ECG is non-diagnostic   due to underlying LBBB. Rare PVC's. 2. LV Ejection Fraction: 56 %. TID: 1.01. Global left ventricular systolic  function: normal. 3. There is normal wall motion displayed, no areas of significant wall motion  abnormality detected. 4. There is an area of decreased activity in the anterior wall at rest and  post stress with normal segmental contractility, highly suggestive of  breast/soft tissue attenuation. 5. No ischemia noted.  US  venous lower extremity Doppler NO EVIDENCE OF DEEP VENOUS THROMBOSIS.   Hospital Course Lori Price is a 89 y.o. female  with PMH hypertension, hyperlipidemia, CAD with prior 4V CABG (LIMA to LAD, SVG to obtuse marginal, sequential SVG to RV, marginal branch in the distal right coronary artery, 2001), aortic stenosis s/p TAVR (26 mm Medtronic Evolut Pro+, 2022), permanent atrial fibrillation, PVCs, OSA (untreated), carotid stenosis s/p right endarterectomy (2007), diabetes type 2, and CKD stage IIIb.  She presented to the emergency department Following a syncopal episode.   Initial workup included elevated troponins, initial troponin of 34 with delta of 384.  CXR concerning for pulmonary edema plus or minus pneumonia.  She was admitted to cardiology service for further evaluation of elevated troponin and syncope.  Hospitalist was consulted for management of multiple comorbidities.  Workup here included a echocardiogram which revealed mildly reduced EF 47% but otherwise TAVR was functioning normally, no significant valvular path he with mild pulmonary hypertension, no clear structural evidence of cause for provoking syncope.  Thus given her elevated troponin, a nuclear stress test was performed which demonstrated normal wall motion, with no significant wall motion abnormalities or evidence of ischemia/scarring.  She was monitored on telemetry throughout her hospital stay with no malignant arrhythmias other than her known permanent atrial fibrillation to cause of her  symptoms.  Hospitalist assisted in other areas of concern including CXR with pulmonary edema which was initially presumed to be immediately acquired pneumonia however she was afebrile, no elevated white count and was able to wean off supplemental O2 without further issue.  No evidence of anemia or other process metabolic abnormalities noted.  She remained normotensive throughout her hospital course.  She did notably have right lower extremity cellulitis and was started on IV antibiotics which ultimately transitioned to doxycycline which she will complete a 5-day course at the time of discharge.  Right lower extremity ultrasound ruled out DVT.  On day of discharge, patient is pleasantly resting in bed with no reported issues.  She is eager to return back to her family vacation which she has 5 children seen on the beach house here locally.  She plans to return back to her skilled nursing facility in the coming days and will follow back up with her primary care provider as well as her cardiologist in [place], [place] .     Pertinent Labs Recent Labs    Units 11/12/23 0801 11/11/23 0338 11/11/23 0123  WBC thou/mcL 9.5 9.9 9.7  HGB gm/dL 87.1 87.6 87.5  HCT % 58.6 38.8 40.7  PLT thou/mcL 176 179 191    Recent Labs    Units 11/11/23 0338 11/10/23 1350  NA mmol/L 139 141  K mmol/L 3.9 3.6  CL mmol/L 102 105  CO2 mmol/L 29 25  BUN mg/dL 28* 28*  CREATININE mg/dL 9.01 8.97   Recent Labs    Units 11/11/23 1702 11/11/23  0123 11/10/23 1933 11/10/23 1601 11/10/23 1350  CTNI pg/mL 215* 562* 568* 384* 34*   No results found for: PROBNP No results for input(s): CHOL, TRIG, HDL, LDL in the last 168 hours.  No components found for: HA1C   Discharge Physical Exam BP 145/74 (BP Location: Right Upper Arm, Patient Position: Lying)   Pulse 79   Temp 98.8 F (37.1 C) (Oral)   Resp 18   Ht 5' 8 (1.727 m)   Wt 220 lb 6.4 oz (100 kg)   SpO2 94%   BMI 33.51 kg/m   General Appearance:  Alert, cooperative, no distress  Neck: No adenopathy, thyroid : not enlarged,  no tenderness/mass/nodules, no carotid bruit or JVD  Lungs:   Clear to auscultation bilaterally, respirations unlabored, good respiratory effort  Heart:  Regular rate and rhythm, S1, S2 normal, no murmur, rub or gallop  Abdomen:   Soft, non-tender, normoactive bowel sounds  Extremities: Extremities normal, atraumatic, no cyanosis or edema  Pulses: 2+ and symmetric  Skin: Intact, no rashes or lesions  Neurologic: Grossly non-focal, AOx3     Discharge Medications   Current Discharge Medication List     START taking these medications      Details  doxycycline monohydrate 100 mg capsule Commonly known as: MONODOX  Take one capsule (100 mg dose) by mouth every 12 (twelve) hours for 4 days. Indication: Infection of the Skin and/or Skin Structures Quantity: 8 capsule       CONTINUE these medications which have NOT CHANGED      Details  apixaban  5 mg tablet Commonly known as: ELIQUIS   Take one tablet (5 mg dose) by mouth 2 (two) times daily.   atenolol  25 mg tablet Commonly known as: TENORMIN   Take one tablet (25 mg dose) by mouth daily.   atorvastatin  calcium  10 mg tablet Commonly known as: LIPITOR  Take one tablet (10 mg dose) by mouth daily.   bisacodyl  5 mg EC tablet Commonly known as: BISACODYL ,DULCOLAX  Take one tablet (5 mg dose) by mouth daily as needed for Constipation.   BREO ELLIPTA  100-25 mcg/inh inhalation powder Generic drug: fluticasone  furoate-vilanterol  Inhale one puff into the lungs daily.   cetirizine 10 mg chewable tablet Commonly known as: ZYRTEC,ALL DAY ALLERGY  Chew one tablet (10 mg dose) by mouth at bedtime.   chlorthalidone  25 mg tablet  Take one tablet (25 mg dose) by mouth daily.   fexofenadine 60 mg tablet Commonly known as: ALLEGRA  Take one tablet (60 mg dose) by mouth daily.   fluticasone  propionate 50 mcg/actuation nasal  spray Commonly known as: FLONASE   one spray by Both Nostrils route daily.   furosemide  20 mg tablet Commonly known as: LASIX   Take one tablet (20 mg dose) by mouth daily.   metFORMIN 500 mg tablet Commonly known as: GLUCOPHAGE  Take one tablet (500 mg dose) by mouth with breakfast.   nystatin  powder Commonly known as: MYCOSTATIN   Apply one Application topically 2 (two) times daily.   potassium chloride  10 mEq CR tablet Commonly known as: KLOR-CON   Take one tablet (10 mEq dose) by mouth 2 (two) times daily.   VOLTAREN 1 % gel Generic drug: diclofenac sodium  Apply two g topically 4 (four) times a day as needed.      * You might also be taking other medications not listed above. If you have questions about any of your other medications, talk to the person who prescribed them or your Primary Care Provider.  Instructions 1.  Fall precautions discussed with patient 2. Aggressive risk factor modification, including improved blood sugar, blood pressure, and cholesterol control have been emphasized.   3. She will follow up with her cardiologist in [place]  once he returns for continued cardiac care and with her primary care provider for routine health maintenance.  Total time required for discharge including documentation,orders, patient exam and patient counseling was Greater than 30 minutes.  Approximately 39 minutes of time was spent in entirety.  ______________________________ Concha Leigh, PA-C [facility] (Cardiology) 11/12/2023, 10:59 AM    CC: Lori Schilling, MD   *Some images could not be shown.

## 2023-11-13 DIAGNOSIS — I214 Non-ST elevation (NSTEMI) myocardial infarction: Secondary | ICD-10-CM | POA: Diagnosis not present

## 2023-11-13 DIAGNOSIS — M6281 Muscle weakness (generalized): Secondary | ICD-10-CM | POA: Diagnosis not present

## 2023-11-13 DIAGNOSIS — R2689 Other abnormalities of gait and mobility: Secondary | ICD-10-CM | POA: Diagnosis not present

## 2023-11-13 DIAGNOSIS — R278 Other lack of coordination: Secondary | ICD-10-CM | POA: Diagnosis not present

## 2023-11-14 DIAGNOSIS — R278 Other lack of coordination: Secondary | ICD-10-CM | POA: Diagnosis not present

## 2023-11-14 DIAGNOSIS — R2689 Other abnormalities of gait and mobility: Secondary | ICD-10-CM | POA: Diagnosis not present

## 2023-11-14 DIAGNOSIS — I214 Non-ST elevation (NSTEMI) myocardial infarction: Secondary | ICD-10-CM | POA: Diagnosis not present

## 2023-11-14 DIAGNOSIS — M6281 Muscle weakness (generalized): Secondary | ICD-10-CM | POA: Diagnosis not present

## 2023-11-14 NOTE — Progress Notes (Signed)
 CARE COORDINATOR CONTACT WITH PATIENT/CAREGIVER AFTER HOSPITAL DISCHARGE   Patient's daughter  nancy was contacted.  Encouraged  nancy to schedule patient a Hospital follow up appointment with their Non NH PCP.  Melissa D. Verdon, RN, BSN Transitions of Care [facility] [phone]

## 2023-11-17 DIAGNOSIS — I214 Non-ST elevation (NSTEMI) myocardial infarction: Secondary | ICD-10-CM | POA: Diagnosis not present

## 2023-11-17 DIAGNOSIS — R278 Other lack of coordination: Secondary | ICD-10-CM | POA: Diagnosis not present

## 2023-11-17 DIAGNOSIS — M545 Low back pain, unspecified: Secondary | ICD-10-CM | POA: Diagnosis not present

## 2023-11-17 DIAGNOSIS — M6281 Muscle weakness (generalized): Secondary | ICD-10-CM | POA: Diagnosis not present

## 2023-11-17 DIAGNOSIS — R52 Pain, unspecified: Secondary | ICD-10-CM | POA: Diagnosis not present

## 2023-11-17 DIAGNOSIS — R2689 Other abnormalities of gait and mobility: Secondary | ICD-10-CM | POA: Diagnosis not present

## 2023-11-18 DIAGNOSIS — I214 Non-ST elevation (NSTEMI) myocardial infarction: Secondary | ICD-10-CM | POA: Diagnosis not present

## 2023-11-18 DIAGNOSIS — R2689 Other abnormalities of gait and mobility: Secondary | ICD-10-CM | POA: Diagnosis not present

## 2023-11-18 DIAGNOSIS — R41841 Cognitive communication deficit: Secondary | ICD-10-CM | POA: Diagnosis not present

## 2023-11-18 DIAGNOSIS — M6281 Muscle weakness (generalized): Secondary | ICD-10-CM | POA: Diagnosis not present

## 2023-11-18 DIAGNOSIS — R278 Other lack of coordination: Secondary | ICD-10-CM | POA: Diagnosis not present

## 2023-11-19 DIAGNOSIS — R278 Other lack of coordination: Secondary | ICD-10-CM | POA: Diagnosis not present

## 2023-11-19 DIAGNOSIS — I214 Non-ST elevation (NSTEMI) myocardial infarction: Secondary | ICD-10-CM | POA: Diagnosis not present

## 2023-11-19 DIAGNOSIS — R41841 Cognitive communication deficit: Secondary | ICD-10-CM | POA: Diagnosis not present

## 2023-11-19 DIAGNOSIS — M6281 Muscle weakness (generalized): Secondary | ICD-10-CM | POA: Diagnosis not present

## 2023-11-19 DIAGNOSIS — R2689 Other abnormalities of gait and mobility: Secondary | ICD-10-CM | POA: Diagnosis not present

## 2023-11-19 DIAGNOSIS — R52 Pain, unspecified: Secondary | ICD-10-CM | POA: Diagnosis not present

## 2023-11-20 DIAGNOSIS — I482 Chronic atrial fibrillation, unspecified: Secondary | ICD-10-CM | POA: Diagnosis not present

## 2023-11-20 DIAGNOSIS — I214 Non-ST elevation (NSTEMI) myocardial infarction: Secondary | ICD-10-CM | POA: Diagnosis not present

## 2023-11-20 DIAGNOSIS — R278 Other lack of coordination: Secondary | ICD-10-CM | POA: Diagnosis not present

## 2023-11-20 DIAGNOSIS — R2689 Other abnormalities of gait and mobility: Secondary | ICD-10-CM | POA: Diagnosis not present

## 2023-11-20 DIAGNOSIS — M6281 Muscle weakness (generalized): Secondary | ICD-10-CM | POA: Diagnosis not present

## 2023-11-20 DIAGNOSIS — E785 Hyperlipidemia, unspecified: Secondary | ICD-10-CM | POA: Diagnosis not present

## 2023-11-20 DIAGNOSIS — R41841 Cognitive communication deficit: Secondary | ICD-10-CM | POA: Diagnosis not present

## 2023-11-20 DIAGNOSIS — G459 Transient cerebral ischemic attack, unspecified: Secondary | ICD-10-CM | POA: Diagnosis not present

## 2023-11-21 DIAGNOSIS — M6281 Muscle weakness (generalized): Secondary | ICD-10-CM | POA: Diagnosis not present

## 2023-11-21 DIAGNOSIS — I214 Non-ST elevation (NSTEMI) myocardial infarction: Secondary | ICD-10-CM | POA: Diagnosis not present

## 2023-11-21 DIAGNOSIS — R278 Other lack of coordination: Secondary | ICD-10-CM | POA: Diagnosis not present

## 2023-11-21 DIAGNOSIS — R2689 Other abnormalities of gait and mobility: Secondary | ICD-10-CM | POA: Diagnosis not present

## 2023-11-21 DIAGNOSIS — R41841 Cognitive communication deficit: Secondary | ICD-10-CM | POA: Diagnosis not present

## 2023-11-24 DIAGNOSIS — R41841 Cognitive communication deficit: Secondary | ICD-10-CM | POA: Diagnosis not present

## 2023-11-24 DIAGNOSIS — I214 Non-ST elevation (NSTEMI) myocardial infarction: Secondary | ICD-10-CM | POA: Diagnosis not present

## 2023-11-24 DIAGNOSIS — M6281 Muscle weakness (generalized): Secondary | ICD-10-CM | POA: Diagnosis not present

## 2023-11-24 DIAGNOSIS — R278 Other lack of coordination: Secondary | ICD-10-CM | POA: Diagnosis not present

## 2023-11-24 DIAGNOSIS — R2689 Other abnormalities of gait and mobility: Secondary | ICD-10-CM | POA: Diagnosis not present

## 2023-11-24 NOTE — Progress Notes (Unsigned)
 Cardiology Clinic Note   Patient Name: Lori Price Date of Encounter: 11/25/2023  Primary Care Provider:  Larnell Hamilton, MD Primary Cardiologist:  [name], MD  Patient Profile    Lori Price 89 year old female presents to the clinic today for follow-up evaluation of her coronary artery disease status post CABG.  Past Medical History    Past Medical History:  Diagnosis Date   Anemia    Arthritis    Asthma    Atrial fibrillation, persistent (HCC)    BPV (benign positional vertigo)    Carotid artery disease (HCC)    s/p R CEA (2007)   Carotid artery occlusion    Coronary artery disease    s/p MI 2001--(catheterizations in 2001 with left main 70% stenosis, LAD 70% followed by 90% stenosis, circumflex 99% stenosis, right coronary artery occluded.    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 Wonda and Dr. Lucas   Sleep apnea    CPAP   no   Vertigo    Past Surgical History:  Procedure Laterality Date   CARDIAC CATHETERIZATION     2001   CARDIAC SURGERY     CAROTID ENDARTERECTOMY Right    cea  Dr. Dyane   CARPAL TUNNEL RELEASE Right    CORONARY ARTERY BYPASS GRAFT     2001 (LIMA to LAD, SVG to obtuse marginal, sequential  SVG to RV , marginal branch in the distal right coronary artery.   EYE SURGERY     bilateral cataracts   knee arthoscopy     left carpal tunnel     ORIF SHOULDER FRACTURE Right 08/30/2013   Procedure: HEMI ARTHROPLASTY;  Surgeon: Elspeth JONELLE Her, MD;  Location: [facility] OR;  Service: Orthopedics;  Laterality: Right;   right carotid endarterectomy     RIGHT/LEFT HEART CATH AND CORONARY/GRAFT ANGIOGRAPHY N/A 12/01/2020   Procedure: RIGHT/LEFT HEART CATH AND CORONARY/GRAFT ANGIOGRAPHY;   Surgeon: Verlin Lonni BIRCH, MD;  Location: [facility] INVASIVE CV LAB;  Service: Cardiovascular;  Laterality: N/A;   TEE WITHOUT CARDIOVERSION N/A 01/09/2021   Procedure: TRANSESOPHAGEAL ECHOCARDIOGRAM (TEE);  Surgeon: Wonda Sharper, MD;  Location: [facility] INVASIVE CV LAB;  Service: Open Heart Surgery;  Laterality: N/A;   TONSILLECTOMY     VASCULAR SURGERY      Allergies  Allergies  Allergen Reactions   Codeine Hives, Itching and Other (See Comments)    extreme agitation   Losartan Other (See Comments)    I can't walk, I can't do anything    Oxycodone  Hives and Itching   Penicillins Swelling and Rash    Has patient had a PCN reaction causing immediate rash, facial/tongue/throat swelling, SOB or lightheadedness with hypotension: NO Has patient had a PCN reaction causing severe rash involving mucus membranes or skin necrosis: NO Has patient had a PCN reaction that required hospitalization: NO  Has patient had a PCN reaction occurring within the last 10 years: NO If all of the above answers are NO, then may proceed with Cephalosporin use.     Pentazocine Other (See Comments) and Nausea And Vomiting   Quinapril Cough   Sibutramine Palpitations   Sulfamethoxazole Hives and Nausea And Vomiting  hives   Sulfonamide Derivatives Rash   Ace Inhibitors Cough   Ak-Mycin [Erythromycin] Other (See Comments)    unknown   Drug Ingredient [Modified Tree Tyrosine Adsorbate] Other (See Comments)    unknown   Egg-Derived Products Hives, Diarrhea and Other (See Comments)    Pt stated My esophagus swells, I get hives, diarrhea, and I pass out    History of Present Illness    Lori Price has a PMH of coronary artery disease, CABG x 4, TIA, atrial fibrillation, PVCs, type 2 diabetes, CKD stage IIIb, aortic stenosis status post TAVR 2022.  Her PMH also includes OSA.  She was previously encouraged to wear CPAP however, she wished to defer CPAP therapy.  She was seen and evaluated by Dr. Lavona  on 05/01/2023.  During that time she noted that her husband of 68 years had passed away from prolonged illness.  She was residing at [facility].  She was ambulating with a walker.  She denied chest pain, new shortness of breath, PND and orthopnea.  She denied palpitations, presyncope and syncope.  She was admitted to [facility] 6/25 with syncope.  On presentation her cardiac troponin was elevated.  Her chest x-ray showed mild pulmonary edema.  Her echocardiogram showed an LVEF of 47%.  Her TAVR valve was functioning normally.  She was noted to have mild pulmonary hypertension.  She reported back pain that was felt to be related to previous anginal symptoms.  SPECT was performed and showed normal perfusion.  She was monitored on telemetry.  No arrhythmias except her known atrial fibrillation were noted.  She was treated with antibiotics for possible cellulitis.  Her syncope was felt to be vasovagal in nature.  She remained stable from a clinical standpoint.  She was discharged and instructed to follow-up with cardiology.  She presents to the clinic today for follow-up evaluation and states she has been having bilateral lower extremity weakness and leg pain.  We reviewed her recent hospitalization and previous cardiac clinic note.  Her and her daughter expressed understanding.  Her main complaint today is with lower extremity pain.  She has not been able to do her normal physical activity since being admitted to the hospital.  I will continue her current medication regimen, order physical therapy eval and treat for aquatics therapy and plan follow-up in 3 months..  Today she denies chest pain, and shortness of breath.  Home Medications    Prior to Admission medications   Medication Sig Start Date End Date Taking? Authorizing Provider  acetaminophen  (TYLENOL ) 500 MG tablet Take 1,000 mg by mouth every 6 (six) hours as needed for moderate pain or mild pain.    [provider]  atenolol  (TENORMIN ) 25 MG  tablet TAKE ONE TABLET BY MOUTH DAILY 07/23/22   Lavona Agent, MD  atorvastatin  (LIPITOR) 10 MG tablet TAKE ONE TABLET BY MOUTH DAILY 03/28/23   Lavona Agent, MD  bisacodyl  (DULCOLAX) 5 MG EC tablet Take 5-10 mg by mouth daily as needed for moderate constipation.    [provider]  BREO ELLIPTA  100-25 MCG/INH AEPB Inhale 1 puff into the lungs daily. 11/07/20   [provider]  chlorthalidone  (HYGROTON ) 25 MG tablet TAKE ONE TABLET BY MOUTH DAILY 09/10/22   Lavona Agent, MD  ELIQUIS  5 MG TABS tablet TAKE ONE TABLET BY MOUTH TWICE DAILY 05/06/23   Lavona Agent, MD  fexofenadine (ALLEGRA) 180 MG tablet Take 180 mg by mouth daily as needed for allergies.    [provider]  furosemide  (LASIX ) 20 MG tablet Take 20 mg daily. Take an additional 20 mg on days of swelling, weight gain of 3 lb overnight or 5 lb in 1 week Patient taking differently: Take an additional 20 mg on days of swelling, weight gain of 3 lb overnight or 5 lb in 1 week 09/05/21   Daneen Damien BROCKS, NP  furosemide  (LASIX ) 40 MG tablet Take 1 tablet (40 mg total) by mouth daily. 05/06/23 08/04/23  Lavona Agent, MD  Glycerin-Hypromellose-PEG 400 (DRY EYE RELIEF DROPS) 0.2-0.2-1 % SOLN Place 1 drop into both eyes daily as needed (Dry eye).    [provider]  ipratropium-albuterol  (DUONEB) 0.5-2.5 (3) MG/3ML SOLN as needed. 05/10/21   [provider]  meclizine  (ANTIVERT ) 25 MG tablet Take 25 mg by mouth daily as needed for dizziness.    [provider]  nystatin  ointment (MYCOSTATIN ) Apply 1 application topically 2 (two) times daily. 01/04/21   Sebastian Lamarr SAUNDERS, PA-C  potassium chloride  (KLOR-CON  M) 10 MEQ tablet Take 1 tablet by mouth daily. Take additional tablet when taking extra lasix  11/05/22   Lavona Agent, MD  potassium chloride  (KLOR-CON ) 10 MEQ tablet Take 1 tablet (10 mEq total) by mouth daily. 11/05/22 02/03/23  Daneen Damien BROCKS, NP    Family History    Family History   Problem Relation Age of Onset   Ovarian cancer Mother    Cancer Mother        Ovarian   COPD Father    Aneurysm Father        Throat   Kidney disease Father    Heart disease Maternal Grandmother    Heart disease Maternal Grandfather    She indicated that her mother is deceased. She indicated that her father is deceased. She indicated that her maternal grandmother is deceased. She indicated that her maternal grandfather is deceased. She indicated that her paternal grandmother is deceased. She indicated that her paternal grandfather is deceased.  Social History    Social History   Socioeconomic History   Marital status: Married    Spouse name: Not on file   Number of children: Not on file   Years of education: Not on file   Highest education level: Not on file  Occupational History   Not on file  Tobacco Use   Smoking status: Never   Smokeless tobacco: Never  Vaping Use   Vaping status: Never Used  Substance and Sexual Activity   Alcohol  use: Not Currently   Drug use: Not Currently   Sexual activity: Not Currently    Partners: Male  Other Topics Concern   Not on file  Social History Narrative   ** Merged History Encounter **       Social Drivers of Health   Financial Resource Strain: Not on file  Food Insecurity: No Food Insecurity (11/10/2023)   Received from [facility]   Hunger Vital Sign    Within the past 12 months, you worried that your food would run out before you got the money to buy more.: Never true    Within the past 12 months, the food you bought just didn't last and you didn't have money to get more.: Never true  Transportation Needs: No Transportation Needs (11/10/2023)   Received from [facility] - Transportation    Lack of Transportation (Medical): No    Lack of Transportation (Non-Medical): No  Physical Activity: Not on file  Stress: No Stress Concern Present (11/10/2023)  Received from [facility] - Occupational Stress Questionnaire    Feeling of Stress : Not at all  Social Connections: Not on file  Intimate Partner Violence: Not At Risk (11/10/2023)   Received from [facility]   HITS    Over the last 12 months how often did your partner physically hurt you?: Never    Over the last 12 months how often did your partner insult you or talk down to you?: Never    Over the last 12 months how often did your partner threaten you with physical harm?: Never    Over the last 12 months how often did your partner scream or curse at you?: Never     Review of Systems    General:  No chills, fever, night sweats or weight changes.  Cardiovascular:  No chest pain, dyspnea on exertion, edema, orthopnea, palpitations, paroxysmal nocturnal dyspnea. Dermatological: No rash, lesions/masses Respiratory: No cough, dyspnea Urologic: No hematuria, dysuria Abdominal:   No nausea, vomiting, diarrhea, bright red blood per rectum, melena, or hematemesis Neurologic:  No visual changes, wkns, changes in mental status. All other systems reviewed and are otherwise negative except as noted above.  Physical Exam    VS:  BP 136/75 (BP Location: Left Arm, Patient Position: Sitting)   Pulse 61   Ht 5' 8 (1.727 m)   Wt 229 lb (103.9 kg)   SpO2 91%   BMI 34.82 kg/m  , BMI Body mass index is 34.82 kg/m. GEN: Well nourished, well developed, in no acute distress. HEENT: normal. Neck: Supple, no JVD, carotid bruits, or masses. Cardiac: Irregularly irregular, no murmurs, rubs, or gallops. No clubbing, cyanosis, bilateral ankle pitting edema.  Radials/DP/PT 2+ and equal bilaterally.  Respiratory:  Respirations regular and unlabored, clear to auscultation bilaterally. GI: Soft, nontender, nondistended, BS + x 4. MS: no deformity or atrophy. Skin: warm and dry, no rash. Neuro:  Strength and sensation are intact. Psych: Normal affect.  Accessory Clinical Findings    Recent Labs: No results  found for requested labs within last 365 days.   Recent Lipid Panel    Component Value Date/Time   CHOL 153 02/08/2016 0852   TRIG 213 (H) 02/08/2016 0852   HDL 39 (L) 02/08/2016 0852   CHOLHDL 3.9 02/08/2016 0852   CHOLHDL 7.8 12/14/2015 0542   VLDL UNABLE TO CALCULATE IF TRIGLYCERIDE OVER 400 mg/dL 92/72/7982 9457   LDLCALC 71 02/08/2016 0852   LDLDIRECT 111.8 02/03/2014 0924         ECG personally reviewed by me today- None today.     Echocardiogram 12/07/2021  IMPRESSIONS     1. Left ventricular ejection fraction, by estimation, is 55 to 60%. The  left ventricle has normal function. The left ventricle has no regional  wall motion abnormalities. Left ventricular diastolic parameters are  indeterminate. Elevated left atrial  pressure.   2. Right ventricular systolic function is normal. The right ventricular  size is normal. There is mildly elevated pulmonary artery systolic  pressure.   3. Left atrial size was severely dilated.   4. Right atrial size was severely dilated.   5. The mitral valve is degenerative. Mild mitral valve regurgitation. No  evidence of mitral stenosis. Moderate mitral annular calcification.   6. The aortic valve has been replaced by a 26 mm Evolute Valve. Aortic  valve regurgitation is trivial and paravalvular in nature. Effective  orifice area, by VTI measures 1.78 cm. Mean gradient 11  mm Hg. DVI 0.36.   7. Echo findings are consistent with normal structure and function of the  aortic valve prosthesis.   8. The inferior vena cava is dilated in size with <50% respiratory  variability, suggesting right atrial pressure of 15 mmHg.   Comparison(s): Similar aortic valve gradients and bradycardia. Technically  difficult study.   FINDINGS   Left Ventricle: Left ventricular ejection fraction, by estimation, is 55  to 60%. The left ventricle has normal function. The left ventricle has no  regional wall motion abnormalities. The left ventricular  internal cavity  size was normal in size.  Suboptimal image quality limits for assessment of left ventricular  hypertrophy. Left ventricular diastolic parameters are indeterminate.  Elevated left atrial pressure.   Right Ventricle: The right ventricular size is normal. No increase in  right ventricular wall thickness. Right ventricular systolic function is  normal. There is mildly elevated pulmonary artery systolic pressure. The  tricuspid regurgitant velocity is 2.71   m/s, and with an assumed right atrial pressure of 15 mmHg, the estimated  right ventricular systolic pressure is 44.4 mmHg.   Left Atrium: Left atrial size was severely dilated.   Right Atrium: Right atrial size was severely dilated.   Pericardium: There is no evidence of pericardial effusion.   Mitral Valve: The mitral valve is degenerative in appearance. Moderate  mitral annular calcification. Mild mitral valve regurgitation. No evidence  of mitral valve stenosis.   Tricuspid Valve: The tricuspid valve is normal in structure. Tricuspid  valve regurgitation is mild . No evidence of tricuspid stenosis.   Aortic Valve: 19. The aortic valve has been repaired/replaced. Aortic  valve regurgitation is trivial. Aortic valve mean gradient measures 11.0  mmHg. Aortic valve peak gradient measures 15.9 mmHg. Aortic valve area, by  VTI measures 1.78 cm. Echo findings   are consistent with normal structure and function of the aortic valve  prosthesis.   Pulmonic Valve: The pulmonic valve was normal in structure. Pulmonic valve  regurgitation is not visualized. No evidence of pulmonic stenosis.   Aorta: The aortic root and ascending aorta are structurally normal, with  no evidence of dilitation.   Venous: The inferior vena cava is dilated in size with less than 50%  respiratory variability, suggesting right atrial pressure of 15 mmHg.   IAS/Shunts: No atrial level shunt detected by color flow Doppler.       Assessment & Plan   1.  Syncope-denies further episodes of lightheadedness, presyncope or syncope.  Echocardiogram showed LVEF of 47% and normal functioning TAVR valve.  Stress testing showed normal perfusion.  On telemetry only atrial fibrillation was noted.  She was treated for possible cellulitis.  Her episode was felt to be vasovagal in nature. Heart healthy low-sodium diet Maintain physical activity Continue to monitor  Coronary artery disease-  Nuclear stress test reassuring. Heart healthy low-sodium diet Continue atenolol , chlorthalidone   Hyperlipidemia-LDL 84 on 01/07/23.  No aspirin  on Eliquis  High-fiber diet Continue atorvastatin   Aortic stenosis-echocardiogram showed well-functioning TAVR valve. Maintain physical activity Repeat echocardiogram showed well-functioning aortic prosthetic  Atrial fibrillation-heart rate today 61.  Avoid triggers caffeine, chocolate, EtOH, dehydration etc.  Reports compliance with apixaban .  Denies bleeding issues. Continue atenolol , apixaban   Lower extremity weakness-noted to have bilateral lower extremity weakness post hospitalization.  I will refer for physical therapy evaluation and treat you for aquatics therapy at [facility].  I would like her to have aquatics therapy 2-3 times per week as tolerated. Ambulatory referral for physical therapy eval  and treat  Disposition: Follow-up with Dr. Lavona or me in 3 months   Josefa HERO. Tranesha Lessner NP-C     11/25/2023, 9:16 AM [facility] [address] Office [phone] Fax [phone]    I spent 15 minutes examining this patient, reviewing medications, and using patient centered shared decision making involving their cardiac care.   I spent  20 minutes reviewing past medical history,  medications, and prior cardiac tests.

## 2023-11-25 ENCOUNTER — Ambulatory Visit: Attending: General Practice | Admitting: General Practice

## 2023-11-25 ENCOUNTER — Encounter: Payer: Self-pay | Admitting: General Practice

## 2023-11-25 VITALS — BP 136/75 | HR 61 | Ht 68.0 in | Wt 229.0 lb

## 2023-11-25 DIAGNOSIS — R55 Syncope and collapse: Secondary | ICD-10-CM | POA: Insufficient documentation

## 2023-11-25 DIAGNOSIS — R2689 Other abnormalities of gait and mobility: Secondary | ICD-10-CM | POA: Diagnosis not present

## 2023-11-25 DIAGNOSIS — I482 Chronic atrial fibrillation, unspecified: Secondary | ICD-10-CM | POA: Insufficient documentation

## 2023-11-25 DIAGNOSIS — R41841 Cognitive communication deficit: Secondary | ICD-10-CM | POA: Diagnosis not present

## 2023-11-25 DIAGNOSIS — M79605 Pain in left leg: Secondary | ICD-10-CM | POA: Insufficient documentation

## 2023-11-25 DIAGNOSIS — Z952 Presence of prosthetic heart valve: Secondary | ICD-10-CM | POA: Diagnosis present

## 2023-11-25 DIAGNOSIS — R278 Other lack of coordination: Secondary | ICD-10-CM | POA: Diagnosis not present

## 2023-11-25 DIAGNOSIS — M79604 Pain in right leg: Secondary | ICD-10-CM | POA: Diagnosis present

## 2023-11-25 DIAGNOSIS — E785 Hyperlipidemia, unspecified: Secondary | ICD-10-CM | POA: Insufficient documentation

## 2023-11-25 DIAGNOSIS — M6281 Muscle weakness (generalized): Secondary | ICD-10-CM | POA: Diagnosis not present

## 2023-11-25 DIAGNOSIS — I2510 Atherosclerotic heart disease of native coronary artery without angina pectoris: Secondary | ICD-10-CM | POA: Diagnosis not present

## 2023-11-25 DIAGNOSIS — I214 Non-ST elevation (NSTEMI) myocardial infarction: Secondary | ICD-10-CM | POA: Diagnosis not present

## 2023-11-25 NOTE — Patient Instructions (Signed)
 Medication Instructions:  Your physician recommends that you continue on your current medications as directed. Please refer to the Current Medication list given to you today.  *If you need a refill on your cardiac medications before your next appointment, please call your pharmacy*  Lab Work: NONE If you have labs (blood work) drawn today and your tests are completely normal, you will receive your results only by: MyChart Message (if you have MyChart) OR A paper copy in the mail If you have any lab test that is abnormal or we need to change your treatment, we will call you to review the results.  Testing/Procedures: NONE  Follow-Up: At [facility], you and your health needs are our priority.  As part of our continuing mission to provide you with exceptional heart care, our providers are all part of one team.  This team includes your primary Cardiologist (physician) and Advanced Practice Providers or APPs (Physician Assistants and Nurse Practitioners) who all work together to provide you with the care you need, when you need it.  Your next appointment:   3 month(s)  Provider:   Josefa Beauvais, NP or [name], MD  We recommend signing up for the patient portal called MyChart.  Sign up information is provided on this After Visit Summary.  MyChart is used to connect with patients for Virtual Visits (Telemedicine).  Patients are able to view lab/test results, encounter notes, upcoming appointments, etc.  Non-urgent messages can be sent to your provider as well.   To learn more about what you can do with MyChart, go to [email or web address].   Other Instructions Use ace wraps for compression of the legs We have placed a referral for Physical Therapy. Someone will reach out to you to schedule an appointment.

## 2023-11-26 DIAGNOSIS — R41841 Cognitive communication deficit: Secondary | ICD-10-CM | POA: Diagnosis not present

## 2023-11-26 DIAGNOSIS — I214 Non-ST elevation (NSTEMI) myocardial infarction: Secondary | ICD-10-CM | POA: Diagnosis not present

## 2023-11-26 DIAGNOSIS — M6281 Muscle weakness (generalized): Secondary | ICD-10-CM | POA: Diagnosis not present

## 2023-11-26 DIAGNOSIS — R278 Other lack of coordination: Secondary | ICD-10-CM | POA: Diagnosis not present

## 2023-11-26 DIAGNOSIS — R2689 Other abnormalities of gait and mobility: Secondary | ICD-10-CM | POA: Diagnosis not present

## 2023-11-27 DIAGNOSIS — R41841 Cognitive communication deficit: Secondary | ICD-10-CM | POA: Diagnosis not present

## 2023-11-27 DIAGNOSIS — R278 Other lack of coordination: Secondary | ICD-10-CM | POA: Diagnosis not present

## 2023-11-27 DIAGNOSIS — R2689 Other abnormalities of gait and mobility: Secondary | ICD-10-CM | POA: Diagnosis not present

## 2023-11-27 DIAGNOSIS — M6281 Muscle weakness (generalized): Secondary | ICD-10-CM | POA: Diagnosis not present

## 2023-11-27 DIAGNOSIS — I214 Non-ST elevation (NSTEMI) myocardial infarction: Secondary | ICD-10-CM | POA: Diagnosis not present

## 2023-11-28 DIAGNOSIS — R2689 Other abnormalities of gait and mobility: Secondary | ICD-10-CM | POA: Diagnosis not present

## 2023-11-28 DIAGNOSIS — R41841 Cognitive communication deficit: Secondary | ICD-10-CM | POA: Diagnosis not present

## 2023-11-28 DIAGNOSIS — R278 Other lack of coordination: Secondary | ICD-10-CM | POA: Diagnosis not present

## 2023-11-28 DIAGNOSIS — M6281 Muscle weakness (generalized): Secondary | ICD-10-CM | POA: Diagnosis not present

## 2023-11-28 DIAGNOSIS — I214 Non-ST elevation (NSTEMI) myocardial infarction: Secondary | ICD-10-CM | POA: Diagnosis not present

## 2023-11-29 DIAGNOSIS — R41841 Cognitive communication deficit: Secondary | ICD-10-CM | POA: Diagnosis not present

## 2023-11-29 DIAGNOSIS — M6281 Muscle weakness (generalized): Secondary | ICD-10-CM | POA: Diagnosis not present

## 2023-11-29 DIAGNOSIS — R278 Other lack of coordination: Secondary | ICD-10-CM | POA: Diagnosis not present

## 2023-11-29 DIAGNOSIS — I214 Non-ST elevation (NSTEMI) myocardial infarction: Secondary | ICD-10-CM | POA: Diagnosis not present

## 2023-11-29 DIAGNOSIS — R2689 Other abnormalities of gait and mobility: Secondary | ICD-10-CM | POA: Diagnosis not present

## 2023-12-01 DIAGNOSIS — R41841 Cognitive communication deficit: Secondary | ICD-10-CM | POA: Diagnosis not present

## 2023-12-01 DIAGNOSIS — R278 Other lack of coordination: Secondary | ICD-10-CM | POA: Diagnosis not present

## 2023-12-01 DIAGNOSIS — R2689 Other abnormalities of gait and mobility: Secondary | ICD-10-CM | POA: Diagnosis not present

## 2023-12-01 DIAGNOSIS — I214 Non-ST elevation (NSTEMI) myocardial infarction: Secondary | ICD-10-CM | POA: Diagnosis not present

## 2023-12-01 DIAGNOSIS — M6281 Muscle weakness (generalized): Secondary | ICD-10-CM | POA: Diagnosis not present

## 2023-12-02 DIAGNOSIS — R41841 Cognitive communication deficit: Secondary | ICD-10-CM | POA: Diagnosis not present

## 2023-12-02 DIAGNOSIS — I214 Non-ST elevation (NSTEMI) myocardial infarction: Secondary | ICD-10-CM | POA: Diagnosis not present

## 2023-12-02 DIAGNOSIS — R278 Other lack of coordination: Secondary | ICD-10-CM | POA: Diagnosis not present

## 2023-12-02 DIAGNOSIS — M6281 Muscle weakness (generalized): Secondary | ICD-10-CM | POA: Diagnosis not present

## 2023-12-02 DIAGNOSIS — R2689 Other abnormalities of gait and mobility: Secondary | ICD-10-CM | POA: Diagnosis not present

## 2023-12-03 DIAGNOSIS — I214 Non-ST elevation (NSTEMI) myocardial infarction: Secondary | ICD-10-CM | POA: Diagnosis not present

## 2023-12-03 DIAGNOSIS — R2689 Other abnormalities of gait and mobility: Secondary | ICD-10-CM | POA: Diagnosis not present

## 2023-12-03 DIAGNOSIS — R278 Other lack of coordination: Secondary | ICD-10-CM | POA: Diagnosis not present

## 2023-12-03 DIAGNOSIS — M6281 Muscle weakness (generalized): Secondary | ICD-10-CM | POA: Diagnosis not present

## 2023-12-03 DIAGNOSIS — R41841 Cognitive communication deficit: Secondary | ICD-10-CM | POA: Diagnosis not present

## 2023-12-04 DIAGNOSIS — I214 Non-ST elevation (NSTEMI) myocardial infarction: Secondary | ICD-10-CM | POA: Diagnosis not present

## 2023-12-04 DIAGNOSIS — M25562 Pain in left knee: Secondary | ICD-10-CM | POA: Diagnosis not present

## 2023-12-04 DIAGNOSIS — R41841 Cognitive communication deficit: Secondary | ICD-10-CM | POA: Diagnosis not present

## 2023-12-04 DIAGNOSIS — M6281 Muscle weakness (generalized): Secondary | ICD-10-CM | POA: Diagnosis not present

## 2023-12-04 DIAGNOSIS — R278 Other lack of coordination: Secondary | ICD-10-CM | POA: Diagnosis not present

## 2023-12-04 DIAGNOSIS — R2689 Other abnormalities of gait and mobility: Secondary | ICD-10-CM | POA: Diagnosis not present

## 2023-12-05 DIAGNOSIS — R278 Other lack of coordination: Secondary | ICD-10-CM | POA: Diagnosis not present

## 2023-12-05 DIAGNOSIS — R41841 Cognitive communication deficit: Secondary | ICD-10-CM | POA: Diagnosis not present

## 2023-12-05 DIAGNOSIS — M6281 Muscle weakness (generalized): Secondary | ICD-10-CM | POA: Diagnosis not present

## 2023-12-05 DIAGNOSIS — I214 Non-ST elevation (NSTEMI) myocardial infarction: Secondary | ICD-10-CM | POA: Diagnosis not present

## 2023-12-05 DIAGNOSIS — R2689 Other abnormalities of gait and mobility: Secondary | ICD-10-CM | POA: Diagnosis not present

## 2023-12-06 DIAGNOSIS — M6281 Muscle weakness (generalized): Secondary | ICD-10-CM | POA: Diagnosis not present

## 2023-12-06 DIAGNOSIS — R2689 Other abnormalities of gait and mobility: Secondary | ICD-10-CM | POA: Diagnosis not present

## 2023-12-06 DIAGNOSIS — R278 Other lack of coordination: Secondary | ICD-10-CM | POA: Diagnosis not present

## 2023-12-06 DIAGNOSIS — R41841 Cognitive communication deficit: Secondary | ICD-10-CM | POA: Diagnosis not present

## 2023-12-06 DIAGNOSIS — I214 Non-ST elevation (NSTEMI) myocardial infarction: Secondary | ICD-10-CM | POA: Diagnosis not present

## 2023-12-07 DIAGNOSIS — R278 Other lack of coordination: Secondary | ICD-10-CM | POA: Diagnosis not present

## 2023-12-07 DIAGNOSIS — M6281 Muscle weakness (generalized): Secondary | ICD-10-CM | POA: Diagnosis not present

## 2023-12-07 DIAGNOSIS — R2689 Other abnormalities of gait and mobility: Secondary | ICD-10-CM | POA: Diagnosis not present

## 2023-12-07 DIAGNOSIS — R41841 Cognitive communication deficit: Secondary | ICD-10-CM | POA: Diagnosis not present

## 2023-12-07 DIAGNOSIS — I214 Non-ST elevation (NSTEMI) myocardial infarction: Secondary | ICD-10-CM | POA: Diagnosis not present

## 2023-12-08 DIAGNOSIS — R2689 Other abnormalities of gait and mobility: Secondary | ICD-10-CM | POA: Diagnosis not present

## 2023-12-08 DIAGNOSIS — I214 Non-ST elevation (NSTEMI) myocardial infarction: Secondary | ICD-10-CM | POA: Diagnosis not present

## 2023-12-08 DIAGNOSIS — R41841 Cognitive communication deficit: Secondary | ICD-10-CM | POA: Diagnosis not present

## 2023-12-08 DIAGNOSIS — M6281 Muscle weakness (generalized): Secondary | ICD-10-CM | POA: Diagnosis not present

## 2023-12-08 DIAGNOSIS — R278 Other lack of coordination: Secondary | ICD-10-CM | POA: Diagnosis not present

## 2023-12-09 DIAGNOSIS — M6281 Muscle weakness (generalized): Secondary | ICD-10-CM | POA: Diagnosis not present

## 2023-12-09 DIAGNOSIS — R41841 Cognitive communication deficit: Secondary | ICD-10-CM | POA: Diagnosis not present

## 2023-12-09 DIAGNOSIS — I214 Non-ST elevation (NSTEMI) myocardial infarction: Secondary | ICD-10-CM | POA: Diagnosis not present

## 2023-12-09 DIAGNOSIS — R2689 Other abnormalities of gait and mobility: Secondary | ICD-10-CM | POA: Diagnosis not present

## 2023-12-09 DIAGNOSIS — R278 Other lack of coordination: Secondary | ICD-10-CM | POA: Diagnosis not present

## 2023-12-10 DIAGNOSIS — M6281 Muscle weakness (generalized): Secondary | ICD-10-CM | POA: Diagnosis not present

## 2023-12-10 DIAGNOSIS — R278 Other lack of coordination: Secondary | ICD-10-CM | POA: Diagnosis not present

## 2023-12-10 DIAGNOSIS — I214 Non-ST elevation (NSTEMI) myocardial infarction: Secondary | ICD-10-CM | POA: Diagnosis not present

## 2023-12-10 DIAGNOSIS — R2689 Other abnormalities of gait and mobility: Secondary | ICD-10-CM | POA: Diagnosis not present

## 2023-12-10 DIAGNOSIS — R41841 Cognitive communication deficit: Secondary | ICD-10-CM | POA: Diagnosis not present

## 2023-12-11 DIAGNOSIS — R2689 Other abnormalities of gait and mobility: Secondary | ICD-10-CM | POA: Diagnosis not present

## 2023-12-11 DIAGNOSIS — R278 Other lack of coordination: Secondary | ICD-10-CM | POA: Diagnosis not present

## 2023-12-11 DIAGNOSIS — M6281 Muscle weakness (generalized): Secondary | ICD-10-CM | POA: Diagnosis not present

## 2023-12-11 DIAGNOSIS — R41841 Cognitive communication deficit: Secondary | ICD-10-CM | POA: Diagnosis not present

## 2023-12-11 DIAGNOSIS — I214 Non-ST elevation (NSTEMI) myocardial infarction: Secondary | ICD-10-CM | POA: Diagnosis not present

## 2023-12-12 DIAGNOSIS — M6281 Muscle weakness (generalized): Secondary | ICD-10-CM | POA: Diagnosis not present

## 2023-12-12 DIAGNOSIS — R278 Other lack of coordination: Secondary | ICD-10-CM | POA: Diagnosis not present

## 2023-12-12 DIAGNOSIS — R2689 Other abnormalities of gait and mobility: Secondary | ICD-10-CM | POA: Diagnosis not present

## 2023-12-12 DIAGNOSIS — R41841 Cognitive communication deficit: Secondary | ICD-10-CM | POA: Diagnosis not present

## 2023-12-12 DIAGNOSIS — I214 Non-ST elevation (NSTEMI) myocardial infarction: Secondary | ICD-10-CM | POA: Diagnosis not present

## 2023-12-13 DIAGNOSIS — M6281 Muscle weakness (generalized): Secondary | ICD-10-CM | POA: Diagnosis not present

## 2023-12-13 DIAGNOSIS — I214 Non-ST elevation (NSTEMI) myocardial infarction: Secondary | ICD-10-CM | POA: Diagnosis not present

## 2023-12-13 DIAGNOSIS — R278 Other lack of coordination: Secondary | ICD-10-CM | POA: Diagnosis not present

## 2023-12-13 DIAGNOSIS — R41841 Cognitive communication deficit: Secondary | ICD-10-CM | POA: Diagnosis not present

## 2023-12-13 DIAGNOSIS — R2689 Other abnormalities of gait and mobility: Secondary | ICD-10-CM | POA: Diagnosis not present

## 2023-12-15 DIAGNOSIS — R278 Other lack of coordination: Secondary | ICD-10-CM | POA: Diagnosis not present

## 2023-12-15 DIAGNOSIS — R41841 Cognitive communication deficit: Secondary | ICD-10-CM | POA: Diagnosis not present

## 2023-12-15 DIAGNOSIS — M6281 Muscle weakness (generalized): Secondary | ICD-10-CM | POA: Diagnosis not present

## 2023-12-15 DIAGNOSIS — I214 Non-ST elevation (NSTEMI) myocardial infarction: Secondary | ICD-10-CM | POA: Diagnosis not present

## 2023-12-15 DIAGNOSIS — R2689 Other abnormalities of gait and mobility: Secondary | ICD-10-CM | POA: Diagnosis not present

## 2023-12-16 DIAGNOSIS — R2689 Other abnormalities of gait and mobility: Secondary | ICD-10-CM | POA: Diagnosis not present

## 2023-12-16 DIAGNOSIS — I214 Non-ST elevation (NSTEMI) myocardial infarction: Secondary | ICD-10-CM | POA: Diagnosis not present

## 2023-12-16 DIAGNOSIS — R278 Other lack of coordination: Secondary | ICD-10-CM | POA: Diagnosis not present

## 2023-12-16 DIAGNOSIS — M6281 Muscle weakness (generalized): Secondary | ICD-10-CM | POA: Diagnosis not present

## 2023-12-16 DIAGNOSIS — R41841 Cognitive communication deficit: Secondary | ICD-10-CM | POA: Diagnosis not present

## 2023-12-17 DIAGNOSIS — M6281 Muscle weakness (generalized): Secondary | ICD-10-CM | POA: Diagnosis not present

## 2023-12-17 DIAGNOSIS — I214 Non-ST elevation (NSTEMI) myocardial infarction: Secondary | ICD-10-CM | POA: Diagnosis not present

## 2023-12-17 DIAGNOSIS — R278 Other lack of coordination: Secondary | ICD-10-CM | POA: Diagnosis not present

## 2023-12-17 DIAGNOSIS — R2689 Other abnormalities of gait and mobility: Secondary | ICD-10-CM | POA: Diagnosis not present

## 2023-12-17 DIAGNOSIS — R41841 Cognitive communication deficit: Secondary | ICD-10-CM | POA: Diagnosis not present

## 2023-12-18 DIAGNOSIS — I214 Non-ST elevation (NSTEMI) myocardial infarction: Secondary | ICD-10-CM | POA: Diagnosis not present

## 2023-12-18 DIAGNOSIS — M6281 Muscle weakness (generalized): Secondary | ICD-10-CM | POA: Diagnosis not present

## 2023-12-18 DIAGNOSIS — R278 Other lack of coordination: Secondary | ICD-10-CM | POA: Diagnosis not present

## 2023-12-18 DIAGNOSIS — R41841 Cognitive communication deficit: Secondary | ICD-10-CM | POA: Diagnosis not present

## 2023-12-18 DIAGNOSIS — R2689 Other abnormalities of gait and mobility: Secondary | ICD-10-CM | POA: Diagnosis not present

## 2023-12-19 DIAGNOSIS — R278 Other lack of coordination: Secondary | ICD-10-CM | POA: Diagnosis not present

## 2023-12-19 DIAGNOSIS — M6281 Muscle weakness (generalized): Secondary | ICD-10-CM | POA: Diagnosis not present

## 2023-12-19 DIAGNOSIS — R2689 Other abnormalities of gait and mobility: Secondary | ICD-10-CM | POA: Diagnosis not present

## 2023-12-19 DIAGNOSIS — I214 Non-ST elevation (NSTEMI) myocardial infarction: Secondary | ICD-10-CM | POA: Diagnosis not present

## 2023-12-22 DIAGNOSIS — R278 Other lack of coordination: Secondary | ICD-10-CM | POA: Diagnosis not present

## 2023-12-22 DIAGNOSIS — M6281 Muscle weakness (generalized): Secondary | ICD-10-CM | POA: Diagnosis not present

## 2023-12-22 DIAGNOSIS — I214 Non-ST elevation (NSTEMI) myocardial infarction: Secondary | ICD-10-CM | POA: Diagnosis not present

## 2023-12-22 DIAGNOSIS — R2689 Other abnormalities of gait and mobility: Secondary | ICD-10-CM | POA: Diagnosis not present

## 2023-12-23 DIAGNOSIS — M6281 Muscle weakness (generalized): Secondary | ICD-10-CM | POA: Diagnosis not present

## 2023-12-23 DIAGNOSIS — R2689 Other abnormalities of gait and mobility: Secondary | ICD-10-CM | POA: Diagnosis not present

## 2023-12-23 DIAGNOSIS — R278 Other lack of coordination: Secondary | ICD-10-CM | POA: Diagnosis not present

## 2023-12-23 DIAGNOSIS — I214 Non-ST elevation (NSTEMI) myocardial infarction: Secondary | ICD-10-CM | POA: Diagnosis not present

## 2023-12-24 DIAGNOSIS — I214 Non-ST elevation (NSTEMI) myocardial infarction: Secondary | ICD-10-CM | POA: Diagnosis not present

## 2023-12-24 DIAGNOSIS — R2689 Other abnormalities of gait and mobility: Secondary | ICD-10-CM | POA: Diagnosis not present

## 2023-12-24 DIAGNOSIS — R278 Other lack of coordination: Secondary | ICD-10-CM | POA: Diagnosis not present

## 2023-12-24 DIAGNOSIS — M6281 Muscle weakness (generalized): Secondary | ICD-10-CM | POA: Diagnosis not present

## 2023-12-25 DIAGNOSIS — S30810A Abrasion of lower back and pelvis, initial encounter: Secondary | ICD-10-CM | POA: Diagnosis not present

## 2023-12-25 DIAGNOSIS — G459 Transient cerebral ischemic attack, unspecified: Secondary | ICD-10-CM | POA: Diagnosis not present

## 2023-12-25 DIAGNOSIS — R278 Other lack of coordination: Secondary | ICD-10-CM | POA: Diagnosis not present

## 2023-12-25 DIAGNOSIS — M6281 Muscle weakness (generalized): Secondary | ICD-10-CM | POA: Diagnosis not present

## 2023-12-25 DIAGNOSIS — R2689 Other abnormalities of gait and mobility: Secondary | ICD-10-CM | POA: Diagnosis not present

## 2023-12-25 DIAGNOSIS — I214 Non-ST elevation (NSTEMI) myocardial infarction: Secondary | ICD-10-CM | POA: Diagnosis not present

## 2023-12-25 DIAGNOSIS — E785 Hyperlipidemia, unspecified: Secondary | ICD-10-CM | POA: Diagnosis not present

## 2023-12-26 ENCOUNTER — Telehealth: Payer: Self-pay | Admitting: Cardiology

## 2023-12-26 DIAGNOSIS — M6281 Muscle weakness (generalized): Secondary | ICD-10-CM | POA: Diagnosis not present

## 2023-12-26 DIAGNOSIS — I214 Non-ST elevation (NSTEMI) myocardial infarction: Secondary | ICD-10-CM | POA: Diagnosis not present

## 2023-12-26 DIAGNOSIS — R2689 Other abnormalities of gait and mobility: Secondary | ICD-10-CM | POA: Diagnosis not present

## 2023-12-26 DIAGNOSIS — Z952 Presence of prosthetic heart valve: Secondary | ICD-10-CM

## 2023-12-26 DIAGNOSIS — R278 Other lack of coordination: Secondary | ICD-10-CM | POA: Diagnosis not present

## 2023-12-26 NOTE — Telephone Encounter (Signed)
 Pt c/o medication issue:  1. Name of Medication: ELIQUIS  5 MG TABS tablet    2. How are you currently taking this medication (dosage and times per day)? As written  3. Are you having a reaction (difficulty breathing--STAT)? No  4. What is your medication issue? Pt was told by her insurance they will no longer cover these medication and pt would like a alternative.

## 2023-12-29 DIAGNOSIS — I214 Non-ST elevation (NSTEMI) myocardial infarction: Secondary | ICD-10-CM | POA: Diagnosis not present

## 2023-12-29 DIAGNOSIS — R2689 Other abnormalities of gait and mobility: Secondary | ICD-10-CM | POA: Diagnosis not present

## 2023-12-29 DIAGNOSIS — R278 Other lack of coordination: Secondary | ICD-10-CM | POA: Diagnosis not present

## 2023-12-29 DIAGNOSIS — M6281 Muscle weakness (generalized): Secondary | ICD-10-CM | POA: Diagnosis not present

## 2023-12-30 DIAGNOSIS — R278 Other lack of coordination: Secondary | ICD-10-CM | POA: Diagnosis not present

## 2023-12-30 DIAGNOSIS — M6281 Muscle weakness (generalized): Secondary | ICD-10-CM | POA: Diagnosis not present

## 2023-12-30 DIAGNOSIS — I214 Non-ST elevation (NSTEMI) myocardial infarction: Secondary | ICD-10-CM | POA: Diagnosis not present

## 2023-12-30 DIAGNOSIS — R2689 Other abnormalities of gait and mobility: Secondary | ICD-10-CM | POA: Diagnosis not present

## 2023-12-31 DIAGNOSIS — M6281 Muscle weakness (generalized): Secondary | ICD-10-CM | POA: Diagnosis not present

## 2023-12-31 DIAGNOSIS — I214 Non-ST elevation (NSTEMI) myocardial infarction: Secondary | ICD-10-CM | POA: Diagnosis not present

## 2023-12-31 DIAGNOSIS — R2689 Other abnormalities of gait and mobility: Secondary | ICD-10-CM | POA: Diagnosis not present

## 2023-12-31 DIAGNOSIS — R278 Other lack of coordination: Secondary | ICD-10-CM | POA: Diagnosis not present

## 2024-01-01 ENCOUNTER — Other Ambulatory Visit (HOSPITAL_COMMUNITY): Payer: Self-pay

## 2024-01-01 ENCOUNTER — Telehealth: Payer: Self-pay | Admitting: Pharmacy Technician

## 2024-01-01 DIAGNOSIS — R278 Other lack of coordination: Secondary | ICD-10-CM | POA: Diagnosis not present

## 2024-01-01 DIAGNOSIS — I214 Non-ST elevation (NSTEMI) myocardial infarction: Secondary | ICD-10-CM | POA: Diagnosis not present

## 2024-01-01 DIAGNOSIS — M6281 Muscle weakness (generalized): Secondary | ICD-10-CM | POA: Diagnosis not present

## 2024-01-01 DIAGNOSIS — R2689 Other abnormalities of gait and mobility: Secondary | ICD-10-CM | POA: Diagnosis not present

## 2024-01-01 NOTE — Telephone Encounter (Signed)
 Lori Price

## 2024-01-02 DIAGNOSIS — I214 Non-ST elevation (NSTEMI) myocardial infarction: Secondary | ICD-10-CM | POA: Diagnosis not present

## 2024-01-02 DIAGNOSIS — R2689 Other abnormalities of gait and mobility: Secondary | ICD-10-CM | POA: Diagnosis not present

## 2024-01-02 DIAGNOSIS — R278 Other lack of coordination: Secondary | ICD-10-CM | POA: Diagnosis not present

## 2024-01-02 DIAGNOSIS — M6281 Muscle weakness (generalized): Secondary | ICD-10-CM | POA: Diagnosis not present

## 2024-01-05 DIAGNOSIS — M6281 Muscle weakness (generalized): Secondary | ICD-10-CM | POA: Diagnosis not present

## 2024-01-05 DIAGNOSIS — R2689 Other abnormalities of gait and mobility: Secondary | ICD-10-CM | POA: Diagnosis not present

## 2024-01-05 DIAGNOSIS — I214 Non-ST elevation (NSTEMI) myocardial infarction: Secondary | ICD-10-CM | POA: Diagnosis not present

## 2024-01-05 DIAGNOSIS — R278 Other lack of coordination: Secondary | ICD-10-CM | POA: Diagnosis not present

## 2024-01-06 DIAGNOSIS — R278 Other lack of coordination: Secondary | ICD-10-CM | POA: Diagnosis not present

## 2024-01-06 DIAGNOSIS — M6281 Muscle weakness (generalized): Secondary | ICD-10-CM | POA: Diagnosis not present

## 2024-01-06 DIAGNOSIS — R2689 Other abnormalities of gait and mobility: Secondary | ICD-10-CM | POA: Diagnosis not present

## 2024-01-06 DIAGNOSIS — I214 Non-ST elevation (NSTEMI) myocardial infarction: Secondary | ICD-10-CM | POA: Diagnosis not present

## 2024-01-06 MED ORDER — APIXABAN 5 MG PO TABS: 5.0000 mg | ORAL_TABLET | Freq: Two times a day (BID) | ORAL | 0 refills | Status: DC

## 2024-01-07 DIAGNOSIS — R2689 Other abnormalities of gait and mobility: Secondary | ICD-10-CM | POA: Diagnosis not present

## 2024-01-07 DIAGNOSIS — M6281 Muscle weakness (generalized): Secondary | ICD-10-CM | POA: Diagnosis not present

## 2024-01-07 DIAGNOSIS — I214 Non-ST elevation (NSTEMI) myocardial infarction: Secondary | ICD-10-CM | POA: Diagnosis not present

## 2024-01-07 DIAGNOSIS — R278 Other lack of coordination: Secondary | ICD-10-CM | POA: Diagnosis not present

## 2024-01-08 DIAGNOSIS — I214 Non-ST elevation (NSTEMI) myocardial infarction: Secondary | ICD-10-CM | POA: Diagnosis not present

## 2024-01-08 DIAGNOSIS — R2689 Other abnormalities of gait and mobility: Secondary | ICD-10-CM | POA: Diagnosis not present

## 2024-01-08 DIAGNOSIS — S30810A Abrasion of lower back and pelvis, initial encounter: Secondary | ICD-10-CM | POA: Diagnosis not present

## 2024-01-08 DIAGNOSIS — E785 Hyperlipidemia, unspecified: Secondary | ICD-10-CM | POA: Diagnosis not present

## 2024-01-08 DIAGNOSIS — M6281 Muscle weakness (generalized): Secondary | ICD-10-CM | POA: Diagnosis not present

## 2024-01-08 DIAGNOSIS — R278 Other lack of coordination: Secondary | ICD-10-CM | POA: Diagnosis not present

## 2024-01-08 DIAGNOSIS — G459 Transient cerebral ischemic attack, unspecified: Secondary | ICD-10-CM | POA: Diagnosis not present

## 2024-01-09 DIAGNOSIS — M6281 Muscle weakness (generalized): Secondary | ICD-10-CM | POA: Diagnosis not present

## 2024-01-09 DIAGNOSIS — R278 Other lack of coordination: Secondary | ICD-10-CM | POA: Diagnosis not present

## 2024-01-09 DIAGNOSIS — I214 Non-ST elevation (NSTEMI) myocardial infarction: Secondary | ICD-10-CM | POA: Diagnosis not present

## 2024-01-09 DIAGNOSIS — R2689 Other abnormalities of gait and mobility: Secondary | ICD-10-CM | POA: Diagnosis not present

## 2024-01-11 DIAGNOSIS — M6281 Muscle weakness (generalized): Secondary | ICD-10-CM | POA: Diagnosis not present

## 2024-01-11 DIAGNOSIS — I214 Non-ST elevation (NSTEMI) myocardial infarction: Secondary | ICD-10-CM | POA: Diagnosis not present

## 2024-01-11 DIAGNOSIS — R2689 Other abnormalities of gait and mobility: Secondary | ICD-10-CM | POA: Diagnosis not present

## 2024-01-11 DIAGNOSIS — R278 Other lack of coordination: Secondary | ICD-10-CM | POA: Diagnosis not present

## 2024-01-12 DIAGNOSIS — M6281 Muscle weakness (generalized): Secondary | ICD-10-CM | POA: Diagnosis not present

## 2024-01-12 DIAGNOSIS — R278 Other lack of coordination: Secondary | ICD-10-CM | POA: Diagnosis not present

## 2024-01-12 DIAGNOSIS — R2689 Other abnormalities of gait and mobility: Secondary | ICD-10-CM | POA: Diagnosis not present

## 2024-01-12 DIAGNOSIS — I214 Non-ST elevation (NSTEMI) myocardial infarction: Secondary | ICD-10-CM | POA: Diagnosis not present

## 2024-01-13 DIAGNOSIS — M6281 Muscle weakness (generalized): Secondary | ICD-10-CM | POA: Diagnosis not present

## 2024-01-13 DIAGNOSIS — I214 Non-ST elevation (NSTEMI) myocardial infarction: Secondary | ICD-10-CM | POA: Diagnosis not present

## 2024-01-13 DIAGNOSIS — R2689 Other abnormalities of gait and mobility: Secondary | ICD-10-CM | POA: Diagnosis not present

## 2024-01-13 DIAGNOSIS — R278 Other lack of coordination: Secondary | ICD-10-CM | POA: Diagnosis not present

## 2024-01-14 DIAGNOSIS — I214 Non-ST elevation (NSTEMI) myocardial infarction: Secondary | ICD-10-CM | POA: Diagnosis not present

## 2024-01-14 DIAGNOSIS — R278 Other lack of coordination: Secondary | ICD-10-CM | POA: Diagnosis not present

## 2024-01-14 DIAGNOSIS — R2689 Other abnormalities of gait and mobility: Secondary | ICD-10-CM | POA: Diagnosis not present

## 2024-01-14 DIAGNOSIS — M6281 Muscle weakness (generalized): Secondary | ICD-10-CM | POA: Diagnosis not present

## 2024-01-15 DIAGNOSIS — I214 Non-ST elevation (NSTEMI) myocardial infarction: Secondary | ICD-10-CM | POA: Diagnosis not present

## 2024-01-15 DIAGNOSIS — G459 Transient cerebral ischemic attack, unspecified: Secondary | ICD-10-CM | POA: Diagnosis not present

## 2024-01-15 DIAGNOSIS — S30810A Abrasion of lower back and pelvis, initial encounter: Secondary | ICD-10-CM | POA: Diagnosis not present

## 2024-01-15 DIAGNOSIS — R2689 Other abnormalities of gait and mobility: Secondary | ICD-10-CM | POA: Diagnosis not present

## 2024-01-15 DIAGNOSIS — R278 Other lack of coordination: Secondary | ICD-10-CM | POA: Diagnosis not present

## 2024-01-15 DIAGNOSIS — M6281 Muscle weakness (generalized): Secondary | ICD-10-CM | POA: Diagnosis not present

## 2024-01-15 DIAGNOSIS — E785 Hyperlipidemia, unspecified: Secondary | ICD-10-CM | POA: Diagnosis not present

## 2024-01-16 DIAGNOSIS — R278 Other lack of coordination: Secondary | ICD-10-CM | POA: Diagnosis not present

## 2024-01-16 DIAGNOSIS — M6281 Muscle weakness (generalized): Secondary | ICD-10-CM | POA: Diagnosis not present

## 2024-01-16 DIAGNOSIS — I214 Non-ST elevation (NSTEMI) myocardial infarction: Secondary | ICD-10-CM | POA: Diagnosis not present

## 2024-01-16 DIAGNOSIS — R2689 Other abnormalities of gait and mobility: Secondary | ICD-10-CM | POA: Diagnosis not present

## 2024-01-19 DIAGNOSIS — R2689 Other abnormalities of gait and mobility: Secondary | ICD-10-CM | POA: Diagnosis not present

## 2024-01-19 DIAGNOSIS — M6281 Muscle weakness (generalized): Secondary | ICD-10-CM | POA: Diagnosis not present

## 2024-01-19 DIAGNOSIS — R278 Other lack of coordination: Secondary | ICD-10-CM | POA: Diagnosis not present

## 2024-01-19 DIAGNOSIS — I214 Non-ST elevation (NSTEMI) myocardial infarction: Secondary | ICD-10-CM | POA: Diagnosis not present

## 2024-01-20 DIAGNOSIS — R2689 Other abnormalities of gait and mobility: Secondary | ICD-10-CM | POA: Diagnosis not present

## 2024-01-20 DIAGNOSIS — R278 Other lack of coordination: Secondary | ICD-10-CM | POA: Diagnosis not present

## 2024-01-20 DIAGNOSIS — M6281 Muscle weakness (generalized): Secondary | ICD-10-CM | POA: Diagnosis not present

## 2024-01-20 DIAGNOSIS — I214 Non-ST elevation (NSTEMI) myocardial infarction: Secondary | ICD-10-CM | POA: Diagnosis not present

## 2024-01-21 DIAGNOSIS — R278 Other lack of coordination: Secondary | ICD-10-CM | POA: Diagnosis not present

## 2024-01-21 DIAGNOSIS — I214 Non-ST elevation (NSTEMI) myocardial infarction: Secondary | ICD-10-CM | POA: Diagnosis not present

## 2024-01-21 DIAGNOSIS — R2689 Other abnormalities of gait and mobility: Secondary | ICD-10-CM | POA: Diagnosis not present

## 2024-01-21 DIAGNOSIS — M6281 Muscle weakness (generalized): Secondary | ICD-10-CM | POA: Diagnosis not present

## 2024-01-22 DIAGNOSIS — R2689 Other abnormalities of gait and mobility: Secondary | ICD-10-CM | POA: Diagnosis not present

## 2024-01-22 DIAGNOSIS — I214 Non-ST elevation (NSTEMI) myocardial infarction: Secondary | ICD-10-CM | POA: Diagnosis not present

## 2024-01-22 DIAGNOSIS — R278 Other lack of coordination: Secondary | ICD-10-CM | POA: Diagnosis not present

## 2024-01-22 DIAGNOSIS — M6281 Muscle weakness (generalized): Secondary | ICD-10-CM | POA: Diagnosis not present

## 2024-01-23 DIAGNOSIS — I214 Non-ST elevation (NSTEMI) myocardial infarction: Secondary | ICD-10-CM | POA: Diagnosis not present

## 2024-01-23 DIAGNOSIS — R55 Syncope and collapse: Secondary | ICD-10-CM | POA: Diagnosis not present

## 2024-01-23 DIAGNOSIS — I129 Hypertensive chronic kidney disease with stage 1 through stage 4 chronic kidney disease, or unspecified chronic kidney disease: Secondary | ICD-10-CM | POA: Diagnosis not present

## 2024-01-23 DIAGNOSIS — Z952 Presence of prosthetic heart valve: Secondary | ICD-10-CM | POA: Diagnosis not present

## 2024-01-23 DIAGNOSIS — M6281 Muscle weakness (generalized): Secondary | ICD-10-CM | POA: Diagnosis not present

## 2024-01-23 DIAGNOSIS — D6869 Other thrombophilia: Secondary | ICD-10-CM | POA: Diagnosis not present

## 2024-01-23 DIAGNOSIS — N1832 Chronic kidney disease, stage 3b: Secondary | ICD-10-CM | POA: Diagnosis not present

## 2024-01-23 DIAGNOSIS — B372 Candidiasis of skin and nail: Secondary | ICD-10-CM | POA: Diagnosis not present

## 2024-01-23 DIAGNOSIS — R2689 Other abnormalities of gait and mobility: Secondary | ICD-10-CM | POA: Diagnosis not present

## 2024-01-23 DIAGNOSIS — E1169 Type 2 diabetes mellitus with other specified complication: Secondary | ICD-10-CM | POA: Diagnosis not present

## 2024-01-23 DIAGNOSIS — I4891 Unspecified atrial fibrillation: Secondary | ICD-10-CM | POA: Diagnosis not present

## 2024-01-23 DIAGNOSIS — I25810 Atherosclerosis of coronary artery bypass graft(s) without angina pectoris: Secondary | ICD-10-CM | POA: Diagnosis not present

## 2024-01-23 DIAGNOSIS — J449 Chronic obstructive pulmonary disease, unspecified: Secondary | ICD-10-CM | POA: Diagnosis not present

## 2024-01-23 DIAGNOSIS — L03115 Cellulitis of right lower limb: Secondary | ICD-10-CM | POA: Diagnosis not present

## 2024-01-23 DIAGNOSIS — R278 Other lack of coordination: Secondary | ICD-10-CM | POA: Diagnosis not present

## 2024-01-23 DIAGNOSIS — I350 Nonrheumatic aortic (valve) stenosis: Secondary | ICD-10-CM | POA: Diagnosis not present

## 2024-01-26 DIAGNOSIS — R2689 Other abnormalities of gait and mobility: Secondary | ICD-10-CM | POA: Diagnosis not present

## 2024-01-26 DIAGNOSIS — R278 Other lack of coordination: Secondary | ICD-10-CM | POA: Diagnosis not present

## 2024-01-26 DIAGNOSIS — M6281 Muscle weakness (generalized): Secondary | ICD-10-CM | POA: Diagnosis not present

## 2024-01-26 DIAGNOSIS — I214 Non-ST elevation (NSTEMI) myocardial infarction: Secondary | ICD-10-CM | POA: Diagnosis not present

## 2024-01-27 DIAGNOSIS — I214 Non-ST elevation (NSTEMI) myocardial infarction: Secondary | ICD-10-CM | POA: Diagnosis not present

## 2024-01-27 DIAGNOSIS — R2689 Other abnormalities of gait and mobility: Secondary | ICD-10-CM | POA: Diagnosis not present

## 2024-01-27 DIAGNOSIS — M6281 Muscle weakness (generalized): Secondary | ICD-10-CM | POA: Diagnosis not present

## 2024-01-27 DIAGNOSIS — R278 Other lack of coordination: Secondary | ICD-10-CM | POA: Diagnosis not present

## 2024-01-28 DIAGNOSIS — M6281 Muscle weakness (generalized): Secondary | ICD-10-CM | POA: Diagnosis not present

## 2024-01-28 DIAGNOSIS — R278 Other lack of coordination: Secondary | ICD-10-CM | POA: Diagnosis not present

## 2024-01-28 DIAGNOSIS — R2689 Other abnormalities of gait and mobility: Secondary | ICD-10-CM | POA: Diagnosis not present

## 2024-01-28 DIAGNOSIS — I214 Non-ST elevation (NSTEMI) myocardial infarction: Secondary | ICD-10-CM | POA: Diagnosis not present

## 2024-01-29 DIAGNOSIS — M6281 Muscle weakness (generalized): Secondary | ICD-10-CM | POA: Diagnosis not present

## 2024-01-29 DIAGNOSIS — I214 Non-ST elevation (NSTEMI) myocardial infarction: Secondary | ICD-10-CM | POA: Diagnosis not present

## 2024-01-29 DIAGNOSIS — R278 Other lack of coordination: Secondary | ICD-10-CM | POA: Diagnosis not present

## 2024-01-29 DIAGNOSIS — R2689 Other abnormalities of gait and mobility: Secondary | ICD-10-CM | POA: Diagnosis not present

## 2024-01-30 DIAGNOSIS — M6281 Muscle weakness (generalized): Secondary | ICD-10-CM | POA: Diagnosis not present

## 2024-01-30 DIAGNOSIS — R2689 Other abnormalities of gait and mobility: Secondary | ICD-10-CM | POA: Diagnosis not present

## 2024-01-30 DIAGNOSIS — I214 Non-ST elevation (NSTEMI) myocardial infarction: Secondary | ICD-10-CM | POA: Diagnosis not present

## 2024-01-30 DIAGNOSIS — R278 Other lack of coordination: Secondary | ICD-10-CM | POA: Diagnosis not present

## 2024-02-02 DIAGNOSIS — I214 Non-ST elevation (NSTEMI) myocardial infarction: Secondary | ICD-10-CM | POA: Diagnosis not present

## 2024-02-02 DIAGNOSIS — M6281 Muscle weakness (generalized): Secondary | ICD-10-CM | POA: Diagnosis not present

## 2024-02-02 DIAGNOSIS — R278 Other lack of coordination: Secondary | ICD-10-CM | POA: Diagnosis not present

## 2024-02-02 DIAGNOSIS — R2689 Other abnormalities of gait and mobility: Secondary | ICD-10-CM | POA: Diagnosis not present

## 2024-02-03 DIAGNOSIS — R2689 Other abnormalities of gait and mobility: Secondary | ICD-10-CM | POA: Diagnosis not present

## 2024-02-03 DIAGNOSIS — M6281 Muscle weakness (generalized): Secondary | ICD-10-CM | POA: Diagnosis not present

## 2024-02-03 DIAGNOSIS — I214 Non-ST elevation (NSTEMI) myocardial infarction: Secondary | ICD-10-CM | POA: Diagnosis not present

## 2024-02-03 DIAGNOSIS — R278 Other lack of coordination: Secondary | ICD-10-CM | POA: Diagnosis not present

## 2024-02-04 DIAGNOSIS — M6281 Muscle weakness (generalized): Secondary | ICD-10-CM | POA: Diagnosis not present

## 2024-02-04 DIAGNOSIS — R2689 Other abnormalities of gait and mobility: Secondary | ICD-10-CM | POA: Diagnosis not present

## 2024-02-04 DIAGNOSIS — I214 Non-ST elevation (NSTEMI) myocardial infarction: Secondary | ICD-10-CM | POA: Diagnosis not present

## 2024-02-04 DIAGNOSIS — R278 Other lack of coordination: Secondary | ICD-10-CM | POA: Diagnosis not present

## 2024-02-05 DIAGNOSIS — R2689 Other abnormalities of gait and mobility: Secondary | ICD-10-CM | POA: Diagnosis not present

## 2024-02-05 DIAGNOSIS — M6281 Muscle weakness (generalized): Secondary | ICD-10-CM | POA: Diagnosis not present

## 2024-02-05 DIAGNOSIS — I214 Non-ST elevation (NSTEMI) myocardial infarction: Secondary | ICD-10-CM | POA: Diagnosis not present

## 2024-02-05 DIAGNOSIS — R278 Other lack of coordination: Secondary | ICD-10-CM | POA: Diagnosis not present

## 2024-02-06 DIAGNOSIS — H00012 Hordeolum externum right lower eyelid: Secondary | ICD-10-CM | POA: Diagnosis not present

## 2024-02-06 DIAGNOSIS — H02209 Unspecified lagophthalmos unspecified eye, unspecified eyelid: Secondary | ICD-10-CM | POA: Diagnosis not present

## 2024-02-06 DIAGNOSIS — M6281 Muscle weakness (generalized): Secondary | ICD-10-CM | POA: Diagnosis not present

## 2024-02-06 DIAGNOSIS — R2689 Other abnormalities of gait and mobility: Secondary | ICD-10-CM | POA: Diagnosis not present

## 2024-02-06 DIAGNOSIS — I214 Non-ST elevation (NSTEMI) myocardial infarction: Secondary | ICD-10-CM | POA: Diagnosis not present

## 2024-02-06 DIAGNOSIS — R278 Other lack of coordination: Secondary | ICD-10-CM | POA: Diagnosis not present

## 2024-02-06 DIAGNOSIS — I10 Essential (primary) hypertension: Secondary | ICD-10-CM | POA: Diagnosis not present

## 2024-02-06 DIAGNOSIS — H109 Unspecified conjunctivitis: Secondary | ICD-10-CM | POA: Diagnosis not present

## 2024-02-09 DIAGNOSIS — I214 Non-ST elevation (NSTEMI) myocardial infarction: Secondary | ICD-10-CM | POA: Diagnosis not present

## 2024-02-09 DIAGNOSIS — M6281 Muscle weakness (generalized): Secondary | ICD-10-CM | POA: Diagnosis not present

## 2024-02-09 DIAGNOSIS — R278 Other lack of coordination: Secondary | ICD-10-CM | POA: Diagnosis not present

## 2024-02-09 DIAGNOSIS — R2689 Other abnormalities of gait and mobility: Secondary | ICD-10-CM | POA: Diagnosis not present

## 2024-02-11 DIAGNOSIS — M6281 Muscle weakness (generalized): Secondary | ICD-10-CM | POA: Diagnosis not present

## 2024-02-11 DIAGNOSIS — R2689 Other abnormalities of gait and mobility: Secondary | ICD-10-CM | POA: Diagnosis not present

## 2024-02-11 DIAGNOSIS — I214 Non-ST elevation (NSTEMI) myocardial infarction: Secondary | ICD-10-CM | POA: Diagnosis not present

## 2024-02-11 DIAGNOSIS — R278 Other lack of coordination: Secondary | ICD-10-CM | POA: Diagnosis not present

## 2024-02-13 DIAGNOSIS — M6281 Muscle weakness (generalized): Secondary | ICD-10-CM | POA: Diagnosis not present

## 2024-02-13 DIAGNOSIS — R278 Other lack of coordination: Secondary | ICD-10-CM | POA: Diagnosis not present

## 2024-02-13 DIAGNOSIS — R2689 Other abnormalities of gait and mobility: Secondary | ICD-10-CM | POA: Diagnosis not present

## 2024-02-13 DIAGNOSIS — I214 Non-ST elevation (NSTEMI) myocardial infarction: Secondary | ICD-10-CM | POA: Diagnosis not present

## 2024-02-16 DIAGNOSIS — R278 Other lack of coordination: Secondary | ICD-10-CM | POA: Diagnosis not present

## 2024-02-16 DIAGNOSIS — R2689 Other abnormalities of gait and mobility: Secondary | ICD-10-CM | POA: Diagnosis not present

## 2024-02-16 DIAGNOSIS — M6281 Muscle weakness (generalized): Secondary | ICD-10-CM | POA: Diagnosis not present

## 2024-02-16 DIAGNOSIS — I214 Non-ST elevation (NSTEMI) myocardial infarction: Secondary | ICD-10-CM | POA: Diagnosis not present

## 2024-02-18 DIAGNOSIS — I214 Non-ST elevation (NSTEMI) myocardial infarction: Secondary | ICD-10-CM | POA: Diagnosis not present

## 2024-02-18 DIAGNOSIS — R2689 Other abnormalities of gait and mobility: Secondary | ICD-10-CM | POA: Diagnosis not present

## 2024-02-18 DIAGNOSIS — M6281 Muscle weakness (generalized): Secondary | ICD-10-CM | POA: Diagnosis not present

## 2024-02-19 NOTE — Progress Notes (Signed)
 Cardiology Clinic Note   Patient Name: Lori Price Date of Encounter: 02/23/2024  Primary Care Provider:  Larnell Hamilton, MD Primary Cardiologist:  [name], MD  Patient Profile    Lori Price 89 year old female presents to the clinic today for follow-up evaluation of her coronary artery disease status post CABG 2001.  Past Medical History    Past Medical History:  Diagnosis Date   Anemia    Arthritis    Asthma    Atrial fibrillation, persistent (HCC)    BPV (benign positional vertigo)    Carotid artery disease    s/p R CEA (2007)   Carotid artery occlusion    Coronary artery disease    s/p MI 2001--(catheterizations in 2001 with left main 70% stenosis, LAD 70% followed by 90% stenosis, circumflex 99% stenosis, right coronary artery occluded.    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 Wonda and Dr. Lucas   Sleep apnea    CPAP   no   Vertigo    Past Surgical History:  Procedure Laterality Date   CARDIAC CATHETERIZATION     2001   CARDIAC SURGERY     CAROTID ENDARTERECTOMY Right    cea  Dr. Dyane   CARPAL TUNNEL RELEASE Right    CORONARY ARTERY BYPASS GRAFT     2001 (LIMA to LAD, SVG to obtuse marginal, sequential  SVG to RV , marginal branch in the distal right coronary artery.   EYE SURGERY     bilateral cataracts   knee arthoscopy     left carpal tunnel     ORIF SHOULDER FRACTURE Right 08/30/2013   Procedure: HEMI ARTHROPLASTY;  Surgeon: Elspeth JONELLE Her, MD;  Location: [facility] OR;  Service: Orthopedics;  Laterality: Right;   right carotid endarterectomy     RIGHT/LEFT HEART CATH AND CORONARY/GRAFT ANGIOGRAPHY N/A 12/01/2020   Procedure: RIGHT/LEFT HEART CATH AND CORONARY/GRAFT ANGIOGRAPHY;  Surgeon:  Verlin Lonni BIRCH, MD;  Location: [facility] INVASIVE CV LAB;  Service: Cardiovascular;  Laterality: N/A;   TEE WITHOUT CARDIOVERSION N/A 01/09/2021   Procedure: TRANSESOPHAGEAL ECHOCARDIOGRAM (TEE);  Surgeon: Wonda Sharper, MD;  Location: [facility] INVASIVE CV LAB;  Service: Open Heart Surgery;  Laterality: N/A;   TONSILLECTOMY     VASCULAR SURGERY      Allergies  Allergies  Allergen Reactions   Codeine Hives, Itching and Other (See Comments)    extreme agitation   Losartan Other (See Comments)    I can't walk, I can't do anything    Oxycodone  Hives and Itching   Penicillins Swelling and Rash    Has patient had a PCN reaction causing immediate rash, facial/tongue/throat swelling, SOB or lightheadedness with hypotension: NO Has patient had a PCN reaction causing severe rash involving mucus membranes or skin necrosis: NO Has patient had a PCN reaction that required hospitalization: NO  Has patient had a PCN reaction occurring within the last 10 years: NO If all of the above answers are NO, then may proceed with Cephalosporin use.     Pentazocine Other (See Comments) and Nausea And Vomiting   Quinapril Cough   Sibutramine Palpitations   Sulfamethoxazole Hives and Nausea And Vomiting    hives  Sulfonamide Derivatives Rash   Ace Inhibitors Cough   Ak-Mycin [Erythromycin] Other (See Comments)    unknown   Drug Ingredient [Modified Tree Tyrosine Adsorbate] Other (See Comments)    unknown   Egg-Derived Products Hives, Diarrhea and Other (See Comments)    Pt stated My esophagus swells, I get hives, diarrhea, and I pass out    History of Present Illness    Lori Price has a PMH of coronary artery disease, CABG x 4, TIA, atrial fibrillation, PVCs, type 2 diabetes, CKD stage IIIb, aortic stenosis status post TAVR 2022.  Her PMH also includes OSA.  She was previously encouraged to wear CPAP however, she wished to defer CPAP therapy.  She was seen and evaluated by Dr. Lavona on  05/01/2023.  During that time she noted that her husband of 68 years had passed away from prolonged illness.  She was residing at [facility].  She was ambulating with a walker.  She denied chest pain, new shortness of breath, PND and orthopnea.  She denied palpitations, presyncope and syncope.  She was admitted to [facility] 6/25 with syncope.  On presentation her cardiac troponin was elevated.  Her chest x-ray showed mild pulmonary edema.  Her echocardiogram showed an LVEF of 47%.  Her TAVR valve was functioning normally.  She was noted to have mild pulmonary hypertension.  She reported back pain that was felt to be related to previous anginal symptoms.  SPECT was performed and showed normal perfusion.  She was monitored on telemetry.  No arrhythmias except her known atrial fibrillation were noted.  She was treated with antibiotics for possible cellulitis.  Her syncope was felt to be vasovagal in nature.  She remained stable from a clinical standpoint.  She was discharged and instructed to follow-up with cardiology.  She presented to the clinic 11/25/23 for follow-up evaluation and stated she had been having bilateral lower extremity weakness and leg pain.  We reviewed her  hospitalization and previous cardiac clinic note.  She and her daughter expressed understanding.  Her main complaint was with lower extremity pain.  She had not been able to do her normal physical activity since being admitted to the hospital.  I  continued her current medication regimen, ordered physical therapy eval and treat for aquatics therapy and plan follow-up in 3 months.  She presents to the clinic today for follow-up evaluation and states she feels mostly well.  She feels that her lower extremity weakness is improving.  She continues to do physical therapy every other day through the week.  She reports compliance with her medications.  She notes that she has a chronic salivary issue which causes her to cough.  She continues to sleep in  a recliner.  Her last clinic visit was reviewed.  She presents with her daughter.  They expressed understanding.  I will continue her current medication regimen.  She reports that she will have labs drawn with her PCP in the next few weeks.  We will request these.  We will plan follow-up in 6 months..  Today she denies chest pain, and shortness of breath, bleeding issues, and syncope.  Home Medications    Prior to Admission medications   Medication Sig Start Date End Date Taking? Authorizing Provider  acetaminophen  (TYLENOL ) 500 MG tablet Take 1,000 mg by mouth every 6 (six) hours as needed for moderate pain or mild pain.    [provider]  atenolol  (TENORMIN ) 25 MG tablet TAKE ONE TABLET BY MOUTH DAILY 07/23/22  Lavona Agent, MD  atorvastatin  (LIPITOR) 10 MG tablet TAKE ONE TABLET BY MOUTH DAILY 03/28/23   Lavona Agent, MD  bisacodyl  (DULCOLAX) 5 MG EC tablet Take 5-10 mg by mouth daily as needed for moderate constipation.    [provider]  BREO ELLIPTA  100-25 MCG/INH AEPB Inhale 1 puff into the lungs daily. 11/07/20   [provider]  chlorthalidone  (HYGROTON ) 25 MG tablet TAKE ONE TABLET BY MOUTH DAILY 09/10/22   Lavona Agent, MD  ELIQUIS  5 MG TABS tablet TAKE ONE TABLET BY MOUTH TWICE DAILY 05/06/23   Lavona Agent, MD  fexofenadine (ALLEGRA) 180 MG tablet Take 180 mg by mouth daily as needed for allergies.    [provider]  furosemide  (LASIX ) 20 MG tablet Take 20 mg daily. Take an additional 20 mg on days of swelling, weight gain of 3 lb overnight or 5 lb in 1 week Patient taking differently: Take an additional 20 mg on days of swelling, weight gain of 3 lb overnight or 5 lb in 1 week 09/05/21   Daneen Damien BROCKS, NP  furosemide  (LASIX ) 40 MG tablet Take 1 tablet (40 mg total) by mouth daily. 05/06/23 08/04/23  Lavona Agent, MD  Glycerin-Hypromellose-PEG 400 (DRY EYE RELIEF DROPS) 0.2-0.2-1 % SOLN Place 1 drop into both eyes daily as needed (Dry  eye).    [provider]  ipratropium-albuterol  (DUONEB) 0.5-2.5 (3) MG/3ML SOLN as needed. 05/10/21   [provider]  meclizine  (ANTIVERT ) 25 MG tablet Take 25 mg by mouth daily as needed for dizziness.    [provider]  nystatin  ointment (MYCOSTATIN ) Apply 1 application topically 2 (two) times daily. 01/04/21   Sebastian Lamarr SAUNDERS, PA-C  potassium chloride  (KLOR-CON  M) 10 MEQ tablet Take 1 tablet by mouth daily. Take additional tablet when taking extra lasix  11/05/22   Lavona Agent, MD  potassium chloride  (KLOR-CON ) 10 MEQ tablet Take 1 tablet (10 mEq total) by mouth daily. 11/05/22 02/03/23  Daneen Damien BROCKS, NP    Family History    Family History  Problem Relation Age of Onset   Ovarian cancer Mother    Cancer Mother        Ovarian   COPD Father    Aneurysm Father        Throat   Kidney disease Father    Heart disease Maternal Grandmother    Heart disease Maternal Grandfather    She indicated that her mother is deceased. She indicated that her father is deceased. She indicated that her maternal grandmother is deceased. She indicated that her maternal grandfather is deceased. She indicated that her paternal grandmother is deceased. She indicated that her paternal grandfather is deceased.  Social History    Social History   Socioeconomic History   Marital status: Married    Spouse name: Not on file   Number of children: Not on file   Years of education: Not on file   Highest education level: Not on file  Occupational History   Not on file  Tobacco Use   Smoking status: Never   Smokeless tobacco: Never  Vaping Use   Vaping status: Never Used  Substance and Sexual Activity   Alcohol  use: Not Currently   Drug use: Not Currently   Sexual activity: Not Currently    Partners: Male  Other Topics Concern   Not on file  Social History Narrative   ** Merged History Encounter **       Social Drivers of Corporate investment banker  Strain: Not on  file  Food Insecurity: No Food Insecurity (11/10/2023)   [facility]   Hunger Vital Sign    Within the past 12 months, you worried that your food would run out before you got the money to buy more.: Never true    Within the past 12 months, the food you bought just didn't last and you didn't have money to get more.: Never true  Transportation Needs: No Transportation Needs (11/10/2023)   Received from [facility]   PRAPARE - Transportation    Lack of Transportation (Medical): No    Lack of Transportation (Non-Medical): No  Physical Activity: Not on file  Stress: No Stress Concern Present (11/10/2023)   Received from [facility] - Occupational Stress Questionnaire    Feeling of Stress : Not at all  Social Connections: Not on file  Intimate Partner Violence: Not At Risk (11/10/2023)   Received from [facility]   HITS    Over the last 12 months how often did your partner physically hurt you?: Never    Over the last 12 months how often did your partner insult you or talk down to you?: Never    Over the last 12 months how often did your partner threaten you with physical harm?: Never    Over the last 12 months how often did your partner scream or curse at you?: Never     Review of Systems    General:  No chills, fever, night sweats or weight changes.  Cardiovascular:  No chest pain, dyspnea on exertion, edema, orthopnea, palpitations, paroxysmal nocturnal dyspnea. Dermatological: No rash, lesions/masses Respiratory: No cough, dyspnea Urologic: No hematuria, dysuria Abdominal:   No nausea, vomiting, diarrhea, bright red blood per rectum, melena, or hematemesis Neurologic:  No visual changes, wkns, changes in mental status. All other systems reviewed and are otherwise negative except as noted above.  Physical Exam    VS:  BP 120/70   Pulse 63   Ht 5' 8.5 (1.74 m)   Wt 230 lb (104.3 kg)   SpO2 95%   BMI 34.46 kg/m  ,  BMI Body mass index is 34.46 kg/m. GEN: Well nourished, well developed, in no acute distress. HEENT: normal. Neck: Supple, no JVD, carotid bruits, or masses. Cardiac: Irregularly irregular, no murmurs, rubs, or gallops. No clubbing, cyanosis, bilateral ankle pitting edema.  Radials/DP/PT 2+ and equal bilaterally.  Respiratory:  Respirations regular and unlabored, clear to auscultation bilaterally. GI: Soft, nontender, nondistended, BS + x 4. MS: no deformity or atrophy. Skin: warm and dry, no rash. Neuro:  Strength and sensation are intact. Psych: Normal affect.  Accessory Clinical Findings    Recent Labs: No results found for requested labs within last 365 days.   Recent Lipid Panel    Component Value Date/Time   CHOL 153 02/08/2016 0852   TRIG 213 (H) 02/08/2016 0852   HDL 39 (L) 02/08/2016 0852   CHOLHDL 3.9 02/08/2016 0852   CHOLHDL 7.8 12/14/2015 0542   VLDL UNABLE TO CALCULATE IF TRIGLYCERIDE OVER 400 mg/dL 92/72/7982 9457   LDLCALC 71 02/08/2016 0852   LDLDIRECT 111.8 02/03/2014 0924         ECG personally reviewed by me today- None today.EKG Interpretation Date/Time:  Monday February 23 2024 10:18:57 EDT Ventricular Rate:  63 PR Interval:    QRS Duration:  152 QT Interval:  462 QTC Calculation: 472 R Axis:   188  Text Interpretation: Atrial fibrillation  with premature ventricular or aberrantly conducted complexes Right superior axis deviation Non-specific intra-ventricular conduction block When compared with ECG of 10-Jan-2023 11:46, Questionable change in QRS duration Borderline criteria for Lateral infarct are no longer Present Confirmed by Emelia Hazy [phone]) on 02/23/2024 10:32:59 AM    Echocardiogram 12/07/2021  IMPRESSIONS     1. Left ventricular ejection fraction, by estimation, is 55 to 60%. The  left ventricle has normal function. The left ventricle has no regional  wall motion abnormalities. Left ventricular diastolic parameters are   indeterminate. Elevated left atrial  pressure.   2. Right ventricular systolic function is normal. The right ventricular  size is normal. There is mildly elevated pulmonary artery systolic  pressure.   3. Left atrial size was severely dilated.   4. Right atrial size was severely dilated.   5. The mitral valve is degenerative. Mild mitral valve regurgitation. No  evidence of mitral stenosis. Moderate mitral annular calcification.   6. The aortic valve has been replaced by a 26 mm Evolute Valve. Aortic  valve regurgitation is trivial and paravalvular in nature. Effective  orifice area, by VTI measures 1.78 cm. Mean gradient 11 mm Hg. DVI 0.36.   7. Echo findings are consistent with normal structure and function of the  aortic valve prosthesis.   8. The inferior vena cava is dilated in size with <50% respiratory  variability, suggesting right atrial pressure of 15 mmHg.   Comparison(s): Similar aortic valve gradients and bradycardia. Technically  difficult study.   FINDINGS   Left Ventricle: Left ventricular ejection fraction, by estimation, is 55  to 60%. The left ventricle has normal function. The left ventricle has no  regional wall motion abnormalities. The left ventricular internal cavity  size was normal in size.  Suboptimal image quality limits for assessment of left ventricular  hypertrophy. Left ventricular diastolic parameters are indeterminate.  Elevated left atrial pressure.   Right Ventricle: The right ventricular size is normal. No increase in  right ventricular wall thickness. Right ventricular systolic function is  normal. There is mildly elevated pulmonary artery systolic pressure. The  tricuspid regurgitant velocity is 2.71   m/s, and with an assumed right atrial pressure of 15 mmHg, the estimated  right ventricular systolic pressure is 44.4 mmHg.   Left Atrium: Left atrial size was severely dilated.   Right Atrium: Right atrial size was severely dilated.    Pericardium: There is no evidence of pericardial effusion.   Mitral Valve: The mitral valve is degenerative in appearance. Moderate  mitral annular calcification. Mild mitral valve regurgitation. No evidence  of mitral valve stenosis.   Tricuspid Valve: The tricuspid valve is normal in structure. Tricuspid  valve regurgitation is mild . No evidence of tricuspid stenosis.   Aortic Valve: 19. The aortic valve has been repaired/replaced. Aortic  valve regurgitation is trivial. Aortic valve mean gradient measures 11.0  mmHg. Aortic valve peak gradient measures 15.9 mmHg. Aortic valve area, by  VTI measures 1.78 cm. Echo findings   are consistent with normal structure and function of the aortic valve  prosthesis.   Pulmonic Valve: The pulmonic valve was normal in structure. Pulmonic valve  regurgitation is not visualized. No evidence of pulmonic stenosis.   Aorta: The aortic root and ascending aorta are structurally normal, with  no evidence of dilitation.   Venous: The inferior vena cava is dilated in size with less than 50%  respiratory variability, suggesting right atrial pressure of 15 mmHg.   IAS/Shunts: No atrial level shunt  detected by color flow Doppler.      Assessment & Plan   1.  Syncope-denies recent episodes.  Prior echocardiogram showed LVEF of 47% and normal functioning TAVR valve.  Stress testing showed normal perfusion.  On telemetry only atrial fibrillation was noted.  She was treated for possible cellulitis.  Her episode was felt to be vasovagal in nature. Continue heart healthy low-sodium diet Continue increased physical activity  continue to monitor  Atrial fibrillation-heart rate today 63 bpm.  Avoid triggers (reviewed).  Continues to report compliance with apixaban .  Again, denies bleeding issues. Continue atenolol , apixaban  Avoid triggers caffeine, chocolate, EtOH, dehydration excetra.  Coronary artery disease-denies chest pain today.  Denies  exertional chest discomfort.  Prior nuclear stress test reassuring. Continue current medical therapy  Hyperlipidemia-LDL 84 on 01/07/23.  No aspirin  on Eliquis  High-fiber diet Continue atorvastatin  Repeat fasting lipids and LFTs-will have labs drawn with PCP in the next couple weeks.  Request labs from [facility] medical  Aortic stenosis-no increased DOE or activity intolerance.  Prior echocardiogram showed well-functioning TAVR valve. Maintain physical activity   Lower extremity weakness-improving with physical activity.   Continue increased physical activity, physical therapy activities  Disposition: Follow-up with Dr. Lavona or me in 6 months   Josefa HERO. Ryder Chesmore NP-C     02/23/2024, 10:35 AM [facility] [address] Office [phone] Fax [phone]    I spent 14 minutes examining this patient, reviewing medications, and using patient centered shared decision making involving their cardiac care.   I spent  20 minutes reviewing past medical history,  medications, and prior cardiac tests.

## 2024-02-20 DIAGNOSIS — M6281 Muscle weakness (generalized): Secondary | ICD-10-CM | POA: Diagnosis not present

## 2024-02-20 DIAGNOSIS — R2689 Other abnormalities of gait and mobility: Secondary | ICD-10-CM | POA: Diagnosis not present

## 2024-02-20 DIAGNOSIS — I214 Non-ST elevation (NSTEMI) myocardial infarction: Secondary | ICD-10-CM | POA: Diagnosis not present

## 2024-02-23 ENCOUNTER — Ambulatory Visit: Attending: General Practice | Admitting: General Practice

## 2024-02-23 ENCOUNTER — Encounter: Payer: Self-pay | Admitting: General Practice

## 2024-02-23 VITALS — BP 120/70 | HR 63 | Ht 68.5 in | Wt 230.0 lb

## 2024-02-23 DIAGNOSIS — I4819 Other persistent atrial fibrillation: Secondary | ICD-10-CM | POA: Insufficient documentation

## 2024-02-23 DIAGNOSIS — I10 Essential (primary) hypertension: Secondary | ICD-10-CM | POA: Diagnosis not present

## 2024-02-23 DIAGNOSIS — E785 Hyperlipidemia, unspecified: Secondary | ICD-10-CM | POA: Diagnosis not present

## 2024-02-23 DIAGNOSIS — Z952 Presence of prosthetic heart valve: Secondary | ICD-10-CM | POA: Insufficient documentation

## 2024-02-23 DIAGNOSIS — R29898 Other symptoms and signs involving the musculoskeletal system: Secondary | ICD-10-CM | POA: Diagnosis not present

## 2024-02-23 DIAGNOSIS — R55 Syncope and collapse: Secondary | ICD-10-CM | POA: Diagnosis not present

## 2024-02-23 MED ORDER — APIXABAN 5 MG PO TABS: 5.0000 mg | ORAL_TABLET | Freq: Two times a day (BID) | ORAL | 0 refills | Status: AC

## 2024-02-23 MED ORDER — FUROSEMIDE 20 MG PO TABS: ORAL_TABLET | ORAL | 2 refills | Status: DC

## 2024-02-23 MED ORDER — CHLORTHALIDONE 25 MG PO TABS: 25.0000 mg | ORAL_TABLET | Freq: Every day | ORAL | 3 refills | Status: AC

## 2024-02-23 MED ORDER — ATORVASTATIN CALCIUM 10 MG PO TABS: 10.0000 mg | ORAL_TABLET | Freq: Every day | ORAL | 2 refills | Status: AC

## 2024-02-23 NOTE — Patient Instructions (Signed)
 Thank you for choosing [facility]!     Medication Instructions:  Your refills have been sent to your pharmacy.  *If you need a refill on your cardiac medications before your next appointment, please call your pharmacy*   Lab Work: No labs were ordered during today's visit. Your lab work will be completed with your primary care provider. If you have labs (blood work) drawn today and your tests are completely normal, you will receive your results only by: MyChart Message (if you have MyChart) OR A paper copy in the mail If you have any lab test that is abnormal or we need to change your treatment, we will call you to review the results.   Testing/Procedures: No procedures were ordered during today's visit.   Your next appointment:   6 month(s)   Provider:   Josefa Beauvais, NP           Follow-Up: At [facility], you and your health needs are our priority.  As part of our continuing mission to provide you with exceptional heart care, we have created designated Provider Care Teams.  These Care Teams include your primary Cardiologist (physician) and Advanced Practice Providers (APPs -  Physician Assistants and Nurse Practitioners) who all work together to provide you with the care you need, when you need it. We recommend signing up for the patient portal called MyChart.  Sign up information is provided on this After Visit Summary.  MyChart is used to connect with patients for Virtual Visits (Telemedicine).  Patients are able to view lab/test results, encounter notes, upcoming appointments, etc.  Non-urgent messages can be sent to your provider as well.   To learn more about what you can do with MyChart, go to [email or web address].

## 2024-02-24 DIAGNOSIS — I214 Non-ST elevation (NSTEMI) myocardial infarction: Secondary | ICD-10-CM | POA: Diagnosis not present

## 2024-02-24 DIAGNOSIS — M6281 Muscle weakness (generalized): Secondary | ICD-10-CM | POA: Diagnosis not present

## 2024-02-24 DIAGNOSIS — R2689 Other abnormalities of gait and mobility: Secondary | ICD-10-CM | POA: Diagnosis not present

## 2024-02-25 DIAGNOSIS — E785 Hyperlipidemia, unspecified: Secondary | ICD-10-CM | POA: Diagnosis not present

## 2024-02-25 DIAGNOSIS — R2689 Other abnormalities of gait and mobility: Secondary | ICD-10-CM | POA: Diagnosis not present

## 2024-02-25 DIAGNOSIS — E119 Type 2 diabetes mellitus without complications: Secondary | ICD-10-CM | POA: Diagnosis not present

## 2024-02-25 DIAGNOSIS — I214 Non-ST elevation (NSTEMI) myocardial infarction: Secondary | ICD-10-CM | POA: Diagnosis not present

## 2024-02-25 DIAGNOSIS — M6281 Muscle weakness (generalized): Secondary | ICD-10-CM | POA: Diagnosis not present

## 2024-02-25 DIAGNOSIS — I4891 Unspecified atrial fibrillation: Secondary | ICD-10-CM | POA: Diagnosis not present

## 2024-02-26 DIAGNOSIS — M6281 Muscle weakness (generalized): Secondary | ICD-10-CM | POA: Diagnosis not present

## 2024-02-26 DIAGNOSIS — I214 Non-ST elevation (NSTEMI) myocardial infarction: Secondary | ICD-10-CM | POA: Diagnosis not present

## 2024-02-26 DIAGNOSIS — R2689 Other abnormalities of gait and mobility: Secondary | ICD-10-CM | POA: Diagnosis not present

## 2024-02-29 DIAGNOSIS — M6281 Muscle weakness (generalized): Secondary | ICD-10-CM | POA: Diagnosis not present

## 2024-02-29 DIAGNOSIS — R2689 Other abnormalities of gait and mobility: Secondary | ICD-10-CM | POA: Diagnosis not present

## 2024-02-29 DIAGNOSIS — I214 Non-ST elevation (NSTEMI) myocardial infarction: Secondary | ICD-10-CM | POA: Diagnosis not present

## 2024-03-01 DIAGNOSIS — M6281 Muscle weakness (generalized): Secondary | ICD-10-CM | POA: Diagnosis not present

## 2024-03-01 DIAGNOSIS — I214 Non-ST elevation (NSTEMI) myocardial infarction: Secondary | ICD-10-CM | POA: Diagnosis not present

## 2024-03-01 DIAGNOSIS — R2689 Other abnormalities of gait and mobility: Secondary | ICD-10-CM | POA: Diagnosis not present

## 2024-03-02 DIAGNOSIS — M6281 Muscle weakness (generalized): Secondary | ICD-10-CM | POA: Diagnosis not present

## 2024-03-02 DIAGNOSIS — I214 Non-ST elevation (NSTEMI) myocardial infarction: Secondary | ICD-10-CM | POA: Diagnosis not present

## 2024-03-02 DIAGNOSIS — R2689 Other abnormalities of gait and mobility: Secondary | ICD-10-CM | POA: Diagnosis not present

## 2024-03-03 DIAGNOSIS — M6281 Muscle weakness (generalized): Secondary | ICD-10-CM | POA: Diagnosis not present

## 2024-03-03 DIAGNOSIS — I214 Non-ST elevation (NSTEMI) myocardial infarction: Secondary | ICD-10-CM | POA: Diagnosis not present

## 2024-03-03 DIAGNOSIS — R2689 Other abnormalities of gait and mobility: Secondary | ICD-10-CM | POA: Diagnosis not present

## 2024-03-04 DIAGNOSIS — R2689 Other abnormalities of gait and mobility: Secondary | ICD-10-CM | POA: Diagnosis not present

## 2024-03-04 DIAGNOSIS — M6281 Muscle weakness (generalized): Secondary | ICD-10-CM | POA: Diagnosis not present

## 2024-03-04 DIAGNOSIS — I214 Non-ST elevation (NSTEMI) myocardial infarction: Secondary | ICD-10-CM | POA: Diagnosis not present

## 2024-03-05 DIAGNOSIS — I214 Non-ST elevation (NSTEMI) myocardial infarction: Secondary | ICD-10-CM | POA: Diagnosis not present

## 2024-03-05 DIAGNOSIS — R2689 Other abnormalities of gait and mobility: Secondary | ICD-10-CM | POA: Diagnosis not present

## 2024-03-05 DIAGNOSIS — M6281 Muscle weakness (generalized): Secondary | ICD-10-CM | POA: Diagnosis not present

## 2024-03-08 DIAGNOSIS — E1169 Type 2 diabetes mellitus with other specified complication: Secondary | ICD-10-CM | POA: Diagnosis not present

## 2024-03-08 DIAGNOSIS — E669 Obesity, unspecified: Secondary | ICD-10-CM | POA: Diagnosis not present

## 2024-03-08 DIAGNOSIS — R55 Syncope and collapse: Secondary | ICD-10-CM | POA: Diagnosis not present

## 2024-03-08 DIAGNOSIS — Z952 Presence of prosthetic heart valve: Secondary | ICD-10-CM | POA: Diagnosis not present

## 2024-03-08 DIAGNOSIS — E559 Vitamin D deficiency, unspecified: Secondary | ICD-10-CM | POA: Diagnosis not present

## 2024-03-08 DIAGNOSIS — I214 Non-ST elevation (NSTEMI) myocardial infarction: Secondary | ICD-10-CM | POA: Diagnosis not present

## 2024-03-08 DIAGNOSIS — I4891 Unspecified atrial fibrillation: Secondary | ICD-10-CM | POA: Diagnosis not present

## 2024-03-08 DIAGNOSIS — I25810 Atherosclerosis of coronary artery bypass graft(s) without angina pectoris: Secondary | ICD-10-CM | POA: Diagnosis not present

## 2024-03-08 DIAGNOSIS — M6281 Muscle weakness (generalized): Secondary | ICD-10-CM | POA: Diagnosis not present

## 2024-03-08 DIAGNOSIS — Z23 Encounter for immunization: Secondary | ICD-10-CM | POA: Diagnosis not present

## 2024-03-08 DIAGNOSIS — E785 Hyperlipidemia, unspecified: Secondary | ICD-10-CM | POA: Diagnosis not present

## 2024-03-08 DIAGNOSIS — Z1339 Encounter for screening examination for other mental health and behavioral disorders: Secondary | ICD-10-CM | POA: Diagnosis not present

## 2024-03-08 DIAGNOSIS — Z1331 Encounter for screening for depression: Secondary | ICD-10-CM | POA: Diagnosis not present

## 2024-03-08 DIAGNOSIS — I350 Nonrheumatic aortic (valve) stenosis: Secondary | ICD-10-CM | POA: Diagnosis not present

## 2024-03-08 DIAGNOSIS — I129 Hypertensive chronic kidney disease with stage 1 through stage 4 chronic kidney disease, or unspecified chronic kidney disease: Secondary | ICD-10-CM | POA: Diagnosis not present

## 2024-03-08 DIAGNOSIS — J449 Chronic obstructive pulmonary disease, unspecified: Secondary | ICD-10-CM | POA: Diagnosis not present

## 2024-03-08 DIAGNOSIS — R2689 Other abnormalities of gait and mobility: Secondary | ICD-10-CM | POA: Diagnosis not present

## 2024-03-08 DIAGNOSIS — Z0000 Encounter for general adult medical examination without abnormal findings: Secondary | ICD-10-CM | POA: Diagnosis not present

## 2024-03-08 DIAGNOSIS — N1832 Chronic kidney disease, stage 3b: Secondary | ICD-10-CM | POA: Diagnosis not present

## 2024-03-08 DIAGNOSIS — Z1389 Encounter for screening for other disorder: Secondary | ICD-10-CM | POA: Diagnosis not present

## 2024-03-08 DIAGNOSIS — D6869 Other thrombophilia: Secondary | ICD-10-CM | POA: Diagnosis not present

## 2024-03-08 LAB — LAB REPORT - SCANNED
A1c: 6.2
EGFR: 67.4

## 2024-03-09 DIAGNOSIS — R2689 Other abnormalities of gait and mobility: Secondary | ICD-10-CM | POA: Diagnosis not present

## 2024-03-09 DIAGNOSIS — I214 Non-ST elevation (NSTEMI) myocardial infarction: Secondary | ICD-10-CM | POA: Diagnosis not present

## 2024-03-09 DIAGNOSIS — M6281 Muscle weakness (generalized): Secondary | ICD-10-CM | POA: Diagnosis not present

## 2024-03-10 DIAGNOSIS — R2689 Other abnormalities of gait and mobility: Secondary | ICD-10-CM | POA: Diagnosis not present

## 2024-03-10 DIAGNOSIS — M6281 Muscle weakness (generalized): Secondary | ICD-10-CM | POA: Diagnosis not present

## 2024-03-10 DIAGNOSIS — I214 Non-ST elevation (NSTEMI) myocardial infarction: Secondary | ICD-10-CM | POA: Diagnosis not present

## 2024-03-11 DIAGNOSIS — M6281 Muscle weakness (generalized): Secondary | ICD-10-CM | POA: Diagnosis not present

## 2024-03-11 DIAGNOSIS — R2689 Other abnormalities of gait and mobility: Secondary | ICD-10-CM | POA: Diagnosis not present

## 2024-03-11 DIAGNOSIS — I214 Non-ST elevation (NSTEMI) myocardial infarction: Secondary | ICD-10-CM | POA: Diagnosis not present

## 2024-03-15 DIAGNOSIS — M6281 Muscle weakness (generalized): Secondary | ICD-10-CM | POA: Diagnosis not present

## 2024-03-15 DIAGNOSIS — R2689 Other abnormalities of gait and mobility: Secondary | ICD-10-CM | POA: Diagnosis not present

## 2024-03-15 DIAGNOSIS — I214 Non-ST elevation (NSTEMI) myocardial infarction: Secondary | ICD-10-CM | POA: Diagnosis not present

## 2024-03-17 DIAGNOSIS — I214 Non-ST elevation (NSTEMI) myocardial infarction: Secondary | ICD-10-CM | POA: Diagnosis not present

## 2024-03-17 DIAGNOSIS — M6281 Muscle weakness (generalized): Secondary | ICD-10-CM | POA: Diagnosis not present

## 2024-03-17 DIAGNOSIS — R2689 Other abnormalities of gait and mobility: Secondary | ICD-10-CM | POA: Diagnosis not present

## 2024-03-18 DIAGNOSIS — M6281 Muscle weakness (generalized): Secondary | ICD-10-CM | POA: Diagnosis not present

## 2024-03-18 DIAGNOSIS — R2689 Other abnormalities of gait and mobility: Secondary | ICD-10-CM | POA: Diagnosis not present

## 2024-03-18 DIAGNOSIS — I214 Non-ST elevation (NSTEMI) myocardial infarction: Secondary | ICD-10-CM | POA: Diagnosis not present

## 2024-03-19 DIAGNOSIS — M6281 Muscle weakness (generalized): Secondary | ICD-10-CM | POA: Diagnosis not present

## 2024-03-19 DIAGNOSIS — I214 Non-ST elevation (NSTEMI) myocardial infarction: Secondary | ICD-10-CM | POA: Diagnosis not present

## 2024-03-19 DIAGNOSIS — R2689 Other abnormalities of gait and mobility: Secondary | ICD-10-CM | POA: Diagnosis not present

## 2024-03-22 DIAGNOSIS — M6281 Muscle weakness (generalized): Secondary | ICD-10-CM | POA: Diagnosis not present

## 2024-03-22 DIAGNOSIS — R2689 Other abnormalities of gait and mobility: Secondary | ICD-10-CM | POA: Diagnosis not present

## 2024-03-22 DIAGNOSIS — I214 Non-ST elevation (NSTEMI) myocardial infarction: Secondary | ICD-10-CM | POA: Diagnosis not present

## 2024-03-23 DIAGNOSIS — I214 Non-ST elevation (NSTEMI) myocardial infarction: Secondary | ICD-10-CM | POA: Diagnosis not present

## 2024-03-23 DIAGNOSIS — M6281 Muscle weakness (generalized): Secondary | ICD-10-CM | POA: Diagnosis not present

## 2024-03-23 DIAGNOSIS — R2689 Other abnormalities of gait and mobility: Secondary | ICD-10-CM | POA: Diagnosis not present

## 2024-03-24 DIAGNOSIS — I214 Non-ST elevation (NSTEMI) myocardial infarction: Secondary | ICD-10-CM | POA: Diagnosis not present

## 2024-03-24 DIAGNOSIS — M6281 Muscle weakness (generalized): Secondary | ICD-10-CM | POA: Diagnosis not present

## 2024-03-24 DIAGNOSIS — R2689 Other abnormalities of gait and mobility: Secondary | ICD-10-CM | POA: Diagnosis not present

## 2024-03-26 DIAGNOSIS — I10 Essential (primary) hypertension: Secondary | ICD-10-CM | POA: Diagnosis not present

## 2024-03-26 DIAGNOSIS — E785 Hyperlipidemia, unspecified: Secondary | ICD-10-CM | POA: Diagnosis not present

## 2024-03-26 DIAGNOSIS — E119 Type 2 diabetes mellitus without complications: Secondary | ICD-10-CM | POA: Diagnosis not present

## 2024-03-26 DIAGNOSIS — R609 Edema, unspecified: Secondary | ICD-10-CM | POA: Diagnosis not present

## 2024-04-02 DIAGNOSIS — Z79899 Other long term (current) drug therapy: Secondary | ICD-10-CM | POA: Diagnosis not present

## 2024-04-02 DIAGNOSIS — Z7984 Long term (current) use of oral hypoglycemic drugs: Secondary | ICD-10-CM | POA: Diagnosis not present

## 2024-04-02 DIAGNOSIS — Z7901 Long term (current) use of anticoagulants: Secondary | ICD-10-CM | POA: Diagnosis not present

## 2024-04-06 ENCOUNTER — Other Ambulatory Visit: Payer: Self-pay | Admitting: Neurology

## 2024-04-07 ENCOUNTER — Other Ambulatory Visit: Payer: Self-pay | Admitting: Neurology

## 2024-04-19 ENCOUNTER — Other Ambulatory Visit: Payer: Self-pay | Admitting: Nurse Practitioner

## 2024-04-19 DIAGNOSIS — E785 Hyperlipidemia, unspecified: Secondary | ICD-10-CM | POA: Diagnosis not present

## 2024-04-19 DIAGNOSIS — E119 Type 2 diabetes mellitus without complications: Secondary | ICD-10-CM | POA: Diagnosis not present

## 2024-04-19 DIAGNOSIS — I4891 Unspecified atrial fibrillation: Secondary | ICD-10-CM | POA: Diagnosis not present

## 2024-04-20 ENCOUNTER — Other Ambulatory Visit: Payer: Self-pay | Admitting: Neurology

## 2024-04-28 DIAGNOSIS — H903 Sensorineural hearing loss, bilateral: Secondary | ICD-10-CM | POA: Diagnosis not present

## 2024-05-09 ENCOUNTER — Other Ambulatory Visit: Payer: Self-pay | Admitting: General Practice
